# Patient Record
Sex: Female | Born: 1970
Health system: Southern US, Community
[De-identification: ages and names within clinical notes are randomized; demographics above are authoritative.]

## PROBLEM LIST (undated history)

## (undated) DIAGNOSIS — R519 Headache, unspecified: Secondary | ICD-10-CM

## (undated) DIAGNOSIS — I219 Acute myocardial infarction, unspecified: Secondary | ICD-10-CM

## (undated) DIAGNOSIS — K219 Gastro-esophageal reflux disease without esophagitis: Secondary | ICD-10-CM

## (undated) DIAGNOSIS — I639 Cerebral infarction, unspecified: Secondary | ICD-10-CM

## (undated) DIAGNOSIS — N3289 Other specified disorders of bladder: Secondary | ICD-10-CM

## (undated) DIAGNOSIS — J45909 Unspecified asthma, uncomplicated: Secondary | ICD-10-CM

## (undated) DIAGNOSIS — F329 Major depressive disorder, single episode, unspecified: Secondary | ICD-10-CM

## (undated) DIAGNOSIS — I10 Essential (primary) hypertension: Secondary | ICD-10-CM

## (undated) DIAGNOSIS — F419 Anxiety disorder, unspecified: Secondary | ICD-10-CM

## (undated) DIAGNOSIS — H269 Unspecified cataract: Secondary | ICD-10-CM

## (undated) DIAGNOSIS — G4730 Sleep apnea, unspecified: Secondary | ICD-10-CM

## (undated) DIAGNOSIS — R0902 Hypoxemia: Secondary | ICD-10-CM

## (undated) DIAGNOSIS — I739 Peripheral vascular disease, unspecified: Secondary | ICD-10-CM

## (undated) DIAGNOSIS — R51 Headache: Secondary | ICD-10-CM

## (undated) DIAGNOSIS — E079 Disorder of thyroid, unspecified: Secondary | ICD-10-CM

## (undated) DIAGNOSIS — G709 Myoneural disorder, unspecified: Secondary | ICD-10-CM

## (undated) DIAGNOSIS — M1990 Unspecified osteoarthritis, unspecified site: Secondary | ICD-10-CM

## (undated) DIAGNOSIS — E785 Hyperlipidemia, unspecified: Secondary | ICD-10-CM

## (undated) DIAGNOSIS — F32A Depression, unspecified: Secondary | ICD-10-CM

## (undated) DIAGNOSIS — E119 Type 2 diabetes mellitus without complications: Secondary | ICD-10-CM

## (undated) DIAGNOSIS — N200 Calculus of kidney: Secondary | ICD-10-CM

## (undated) DIAGNOSIS — R0602 Shortness of breath: Secondary | ICD-10-CM

## (undated) DIAGNOSIS — I209 Angina pectoris, unspecified: Secondary | ICD-10-CM

## (undated) DIAGNOSIS — T7840XA Allergy, unspecified, initial encounter: Secondary | ICD-10-CM

## (undated) DIAGNOSIS — F4310 Post-traumatic stress disorder, unspecified: Secondary | ICD-10-CM

## (undated) DIAGNOSIS — F603 Borderline personality disorder: Secondary | ICD-10-CM

## (undated) HISTORY — DX: Hypoxemia: R09.02

## (undated) HISTORY — DX: Hyperlipidemia, unspecified: E78.5

## (undated) HISTORY — DX: Unspecified cataract: H26.9

## (undated) HISTORY — PX: CHOLECYSTECTOMY: SHX55

## (undated) HISTORY — PX: SPINE SURGERY: SHX786

## (undated) HISTORY — DX: Allergy, unspecified, initial encounter: T78.40XA

## (undated) HISTORY — PX: WISDOM TOOTH EXTRACTION: SHX21

## (undated) HISTORY — PX: CARPAL TUNNEL RELEASE: SHX101

## (undated) HISTORY — DX: Disorder of thyroid, unspecified: E07.9

## (undated) HISTORY — DX: Cerebral infarction, unspecified: I63.9

## (undated) HISTORY — DX: Acute myocardial infarction, unspecified: I21.9

## (undated) HISTORY — DX: Peripheral vascular disease, unspecified: I73.9

---
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
## 2001-08-20 HISTORY — PX: CERVICAL FUSION: SHX112

## 2007-11-04 ENCOUNTER — Ambulatory Visit: Payer: Self-pay | Admitting: Cardiology

## 2007-11-14 ENCOUNTER — Ambulatory Visit: Payer: Self-pay | Admitting: Cardiology

## 2008-03-08 ENCOUNTER — Ambulatory Visit: Payer: Self-pay | Admitting: Cardiology

## 2010-12-28 ENCOUNTER — Other Ambulatory Visit: Payer: Self-pay | Admitting: Medical

## 2011-01-01 ENCOUNTER — Other Ambulatory Visit: Payer: Self-pay | Admitting: Medical

## 2011-01-02 ENCOUNTER — Other Ambulatory Visit: Payer: Self-pay | Admitting: Medical

## 2011-01-02 NOTE — Assessment & Plan Note (Signed)
[facility]                          [facility]   NAME:Kathryn Jimenez, Kathryn Jimenez                        MRN:          [id number]  DATE:03/08/2008                            DOB:          [birth year]    PRIMARY CARDIOLOGIST:  Learta Codding, MD, [facility]   REASON FOR VISIT:  Scheduled followup.   Please refer to Meredith Mody initial office consultation note of  November 04, 2007, for full details.   Kathryn Jimenez was referred for extensive noninvasive workup for evaluation  of chest pain.  She presented with cardiac risk factors notable for type  2 diabetes mellitus and obesity.  Studies consisted of a baseline 2-D  echo, dobutamine stress echo, fasting lipid profile, D-dimer level, and  overnight pulse oximetry for screening of sleep apnea.   All of the studies, saved for the sleep apnea screening, were normal.  Echocardiography revealed normal LVEF (55-60%) with no wall motion  abnormalities.  The dobutamine stress echo test yielded no evidence of  ischemia.   D-dimer level was entirely within normal limits.   Overnight pulse oximetry screening was suggestive of possible sleep  apnea.  She has since been formally diagnosed with this, and has been  placed on CPAP, by Dr. Ninetta Lights, for the past month or so.   Fasting lipid profile was also checked and the patient was subsequently  started on simvastatin 40 daily, for an LDL level of 151.  She reported  muscle weakness with this, however, and was started on Crestor 10 daily.  This resulted in a marked drop in her LDL from 151 to her current level  of 84.  Total cholesterol dropped from 222 to 146.   Clinically, Kathryn Jimenez states that she is still experiencing some  weakness with the statin, but she feels that she is able to tolerate  it at this current dose.   Since our last visit, Kathryn Jimenez has been placed on Lantus insulin, for  aggressive treatment of her uncontrolled diabetes  mellitus.  She is also  noting improvement in her overall stamina and energy level during the  day, since starting on CPAP approximately 1 month ago.   Of note, the patient was also referred for CardioNet monitoring to  exclude any dysrhythmia, given her complaints of palpitations.  This  demonstrated a brief episode of SVT, with no definite evidence of atrial  fibrillation and most suggestive of an atrial tachycardia.  This was of  very brief duration, lasting less than 20 beats.  No other dysrhythmia  noted.  These results were also reviewed with the patient.   CURRENT MEDICATIONS:  1. Crestor 10 nightly.  2. Lantus 20 units nightly.  3. Furosemide 40 daily.  4. Potassium 20 daily.  5. Lisinopril 20 daily.  6. Gabapentin 1200 t.i.d.  7. Actos 30 daily.  8. Glyburide/metformin 5/500 2 tablets b.i.d.  9. Topamax 100 nightly.  10.Lorazepam 1 mg q.i.d.  11.Effexor XR 150 b.i.d.  12.Depo-Provera every 3 months.   PHYSICAL EXAMINATION:  VITAL SIGNS:  Blood pressure 134/74, pulse 90,  regular and weight  342.  GENERAL:  A 40 year old female, morbidly obese, sitting upright, no  distress.  HEENT:  Normocephalic and atraumatic.  NECK:  Palpable carotid pulses without bruits; unable to assess JVD,  secondary to neck girth.  LUNGS:  Clear to auscultation in all fields.  HEART:  RRR (S1S2).  No significant murmurs.  ABDOMEN:  Protuberant, nontender.  EXTREMITIES:  2+ bilateral nonpitting edema.  NEURO:  No focal deficit.   IMPRESSION:  1. Noncardiac chest pain.      a.     Negative extensive noninvasive workup, negative for       ischemia.  2. Normal LVF.  3. Insulin-dependent diabetes mellitus.  4. Dyslipidemia.      a.     Significant improvement following addition of a statin.  5. Morbid obesity.  6. Chronic lower extremity edema.  7. Gastroesophageal reflux disease.      a.     History of esophageal dilatation.  8. Palpitations.      a.     No definite dysrhythmia by  recent CardioNet monitoring.   PLAN:  No further cardiac testing recommended at this point in time.  We  spent quite a bit of time discussing the importance of primary  prevention, with respect to her diabetes mellitus, dyslipidemia, as well  as morbid obesity.  I suggested that she try to tolerate the low dose of  Crestor if she could, given her very good response to a statin based on  most recent lipid profile.  Also suggested that we add fish oil to her  regimen, as well.  Future followup of her lipid profile will be deferred  to Dr. Ernestine Conrad, with whom the patient is to maintain regular  scheduled followup.   We will have the patient return to Dr. Lewayne Bunting on an as needed basis.      Rozell Searing, PA-C  Electronically Signed      Jonelle Sidle, MD  Electronically Signed   GS/MedQ  DD: 03/08/2008  DT: 03/09/2008  Job #: [id number]   cc:   Ernestine Conrad, MD

## 2011-01-02 NOTE — Assessment & Plan Note (Signed)
[facility]                          [facility] NOTE   NAME:Nobles, JANAZIA                        MRN:          [id number]  DATE:11/04/2007                            DOB:          1970/12/20    CARDIOLOGIST:  She will be new to Dr. Lewayne Bunting.   REFERRING PHYSICIAN:  Dr. Ernestine Conrad.   REASON FOR REFERRAL:  Chest pain.   HISTORY OF PRESENT ILLNESS:  Ms. Sonnenfeld is a 40 year old female with a  history of diabetes mellitus since 1999 who presents to the office today  for further evaluation of chest pain.  The patient has a significant  history of depression, post-traumatic stress disorder and panic disorder  as well as obsessive compulsive disorder.  She apparently has been under  quite a bit of stress recently.  She has had two to three episodes of  chest pain over the last several weeks.  Her worst episode was actually  at the end of December.  She presented to the emergency room on New  Years Eve with complaints of chest pain.  She was diagnosed with chest  wall pain by the ER physician.  She had one set of cardiac markers which  were negative.  She also had a chest x-ray which showed no active  disease.  Her EKG showed a rightward axis, normal sinus rhythm and no  acute changes.  She was tachycardic with a heart rate 112.   The patient notes that her chest pain has been brought on by stress in  the past. Her last episode was about a week ago when she was arguing  with her husband.  She describes it as a stabbing type pain on the left  side.  It radiates down through her breast under her breasts.  Sometimes  it last a few seconds.  It has lasted up to about 5 minutes in the past.  She does note some slight shortness of breath with this but no nausea.  She has been diaphoretic in the past.  Her worst episode made her  lightheaded but she denies any syncope or near-syncope.  She does note  her heart racing sometimes when she gets upset.   Otherwise denies any  palpitations. She sleeps on three pillows and has done so for many  years.  She also awakens short of breath from time to time.  Her  symptoms are not exactly consistent with PND however.  She has chronic  pedal edema.  She is on Lasix therapy.  It seems to have gotten worse  recently and it sounds as though Dr. Loney Hering may want to switch her Actos  to a different medication secondary to this.   PAST MEDICAL HISTORY:  1. Diabetes mellitus since 1999.  2. Questionable exercise-induced asthma.  3. Depression.  4. Obsessive-compulsive disorder.  5. Posttraumatic stress disorder.  6. Panic disorder.  7. Diabetic neuropathy.  8. Peripheral edema.  9. Irritable bowel syndrome.  10.History of ovarian cyst.  11.GERD.      a.     Status post esophageal dilatation in the past.  12.Allergic rhinitis.  13.History of phen-fen use in the 1990s .  14.History of the urethra stretching.  15.Laparoscopic cholecystectomy 2002.  16.History of C5-C6 fusion 2003.   MEDICATIONS:  1. Loratadine 10 mg daily.  2. Effexor XR 75 mg b.i.d.  3. Lorazepam 1 mg 4 times a day.  4. Topamax 100 mg q.h.s.  5. Glyburide/metformin 5/500 mg b.i.d.  6. Actos 30 mg daily.  7. Gabapentin 600 mg 3 times a day.  8. Lisinopril 20 mg daily.  9. Furosemide 40 mg daily.  10.Potassium 10 mEq daily.  11.Albuterol inhaler p.r.n.  12.Depo Provera q. 3 months.  13.Ibuprofen p.r.n.  14.Hydrocodone/APAP p.r.n.   ALLERGIES:  She is developed a rash to morphine in the past, GI upset to  Augmentin. She had some type of reaction when she received some type of  dye for a bone scan several years ago.  I cannot find records on this.  She did pass out and was apparently resuscitated with food.  There was  no evidence of anaphylaxis per her report.  She denies any history of  allergies to shellfish.   SOCIAL HISTORY:  She denies any history of tobacco or alcohol abuse.  She is married, has one stepson and  she is disabled secondary to her  psychiatric disorders.   FAMILY HISTORY:  Significant for COPD in her mother who died from  congestive heart failure at age 73.  Her father had lung cancer  secondary to Agent [place] and also had strokes in his 70s.  She has a  sister who had rheumatic heart disease.   REVIEW OF SYSTEMS:  Please see HPI.  She does note nasal congestion  recently as well as sore throat.  She also notes some dysphagia which  was mild.  This was nothing like what she had with her esophageal  dilatation in the past.  She denies melena or hematochezia. She denies  any dysuria or hematuria.  She denies any monocular blindness,  unilateral weakness, difficulty with speech or facial drooping.  Denies  any claudication symptoms.  The patient notes that her husband tells her  that she snores. She also notes significant daytime hypersomnolence. She  was worked up for sleep apnea many years ago. This was apparently  negative at that time. The rest of the review of systems are negative.   PHYSICAL EXAM:  She is a well-nourished, well-developed female in no  distress.  Blood pressure is 114/64, pulse 112, temperature 98.4, weight 336  pounds.  Oxygen saturation 99% on room air.  HEENT:  Normal.  NECK:  I cannot appreciate any JVD secondary to neck girth.  CARDIAC:  Normal S1, S2. Regular rate and rhythm.  I cannot appreciate  any murmurs.  LUNGS:  Clear to auscultation bilaterally.  ABDOMEN:  Soft, nontender.  No organomegaly.  Normoactive bowel sounds.  No bruits.  EXTREMITIES:  With trace edema bilaterally.  Calves are soft, nontender.  Skin is warm and dry.  NEUROLOGIC:  She is alert and oriented x3.  Cranial nerves II-XII  grossly intact.  VASCULAR:  No carotid artery bruits noted bilaterally.   Electrocardiogram revealed sinus tachycardia with a heart rate of 113,  rightward axis, nonspecific ST-T wave changes.   IMPRESSION:  1. Chest pain.  2. Diabetes mellitus type  2.  3. Fatigue.      a.     Rule out sleep apnea.  4. Morbid obesity.      a.     History  of phen-fen use.  5. Questionable history of asthma.  6. Depression/post-traumatic stress disorder.  7. Peripheral neuropathy.  8. Peripheral edema.  9. History of gastroesophageal reflux disease with esophageal      dilatation in the past.      a.     Recent dysphagia - unlike previous dysphagia requiring       dilatation.  10.Sinus tachycardia.   DISCUSSION:  Ms. Whitehorn presents to the office today for further  evaluation of chest pain.  She also used phen-fen many years ago.  She  has had no follow-up testing since that time.  Her chest pain is fairly  atypical.  She does have risk factors to include diabetes mellitus.  She  is somewhat tachycardiac on exam.  I suspect that this is likely  physiologic given her morbid obesity.   PLAN:  1. Check a 2-D echocardiogram to assess cardiac structure and function      and valvular status especially given her history of phen-fen use.  2. Will proceed with a dobutamine stress echocardiogram to rule out      the possibility of ischemia as a cause for her chest pain.  3. We will also arrange apnea link to rule out sleep apnea given her      history of fatigue.  She certainly has a body habitus for      obstructive sleep apnea.  4. We will set her up for a fasting lipid profile for risk      stratification. Given her history of diabetes mellitus, if her LDL      is greater than 70, she would likely benefit from statin therapy.  5. We will have her return for follow-up in the next 4-6 weeks to go      over the above testing.  She does want a prescription for exercise.      We will review that with her when she returns to go over the      results of for testing.  6. Of note, she does have sinus tachycardia and a rightward axis. We      will go ahead and check a D-dimer to rule out the possibility of      pulmonary embolus.  7. Upon further review  with Dr. Andee Lineman who also saw patient, she does      note significant palpitations to him.  Therefore we will set her up      for a CardioNet event monitor to further assess these.  We will see      her back after all the above testing is completed.      Tereso Newcomer, PA-C  Electronically Signed      Learta Codding, MD,FACC  Electronically Signed   SW/MedQ  DD: 11/04/2007  DT: 11/04/2007  Job #: [id number]   cc:   Leta Jungling, MD

## 2014-02-26 ENCOUNTER — Encounter (INDEPENDENT_AMBULATORY_CARE_PROVIDER_SITE_OTHER): Payer: Self-pay | Admitting: *Deleted

## 2014-03-17 ENCOUNTER — Encounter (INDEPENDENT_AMBULATORY_CARE_PROVIDER_SITE_OTHER): Payer: Self-pay

## 2014-03-18 ENCOUNTER — Other Ambulatory Visit (INDEPENDENT_AMBULATORY_CARE_PROVIDER_SITE_OTHER): Payer: Self-pay | Admitting: *Deleted

## 2014-03-18 ENCOUNTER — Encounter (INDEPENDENT_AMBULATORY_CARE_PROVIDER_SITE_OTHER): Payer: Self-pay | Admitting: *Deleted

## 2014-03-18 DIAGNOSIS — Z800 Family history of malignant neoplasm of digestive organs: Secondary | ICD-10-CM

## 2014-03-18 DIAGNOSIS — Z86010 Personal history of colonic polyps: Secondary | ICD-10-CM

## 2014-05-18 ENCOUNTER — Encounter (HOSPITAL_COMMUNITY): Payer: Self-pay | Admitting: Pharmacy Technician

## 2014-05-21 ENCOUNTER — Telehealth (INDEPENDENT_AMBULATORY_CARE_PROVIDER_SITE_OTHER): Payer: Self-pay | Admitting: *Deleted

## 2014-05-21 DIAGNOSIS — Z1211 Encounter for screening for malignant neoplasm of colon: Secondary | ICD-10-CM

## 2014-05-21 NOTE — Telephone Encounter (Signed)
Patient needs trilyte 

## 2014-05-24 MED ORDER — PEG 3350-KCL-NA BICARB-NACL 420 G PO SOLR
4000.0000 mL | Freq: Once | ORAL | Status: DC
Start: 2014-05-24 — End: 2014-06-24

## 2014-05-27 ENCOUNTER — Telehealth (INDEPENDENT_AMBULATORY_CARE_PROVIDER_SITE_OTHER): Payer: Self-pay | Admitting: *Deleted

## 2014-05-27 NOTE — Telephone Encounter (Signed)
agree

## 2014-05-27 NOTE — Telephone Encounter (Signed)
PCP: [facility]   Procedure: tcs w/ propofol  Reason/Indication:  fam hx colon ca, hx polyps  Has patient had this procedure before?  Yes, 2010 --s canned  If so, when, by whom and where?    Is there a family history of colon cancer?  Yes, half sister  Who?  What age when diagnosed?    Is patient diabetic?   yes      Does patient have prosthetic heart valve?  no  Do you have a pacemaker?  no  Has patient ever had endocarditis? no  Has patient had joint replacement within last 12 months?  no  Does patient tend to be constipated or take laxatives? occassionally  Is patient on Coumadin, Plavix and/or Aspirin? no  Medications: see epic  Allergies: see epic  Medication Adjustment: hold metformin day before, decrease Lantus to 15 units tid day before & novolog to 30 units bid day before  Procedure date & time: 06/24/14 at 730 -- pre-op 06/17/14 at 11:00

## 2014-06-16 NOTE — Patient Instructions (Signed)
Kathryn Jimenez  06/16/2014   Your procedure is scheduled on:  06/24/14  Report to [facility] at 6:15 AM.  Call this number if you have problems the morning of surgery: [phone]   Remember:   Do not eat food or drink liquids after midnight.   Take these medicines the morning of surgery with A SIP OF WATER:  ALBUTEROL (ALSO BRING WITH YOU),AMLODIPINE,WELBUTRIN,  LISINOPRIL,ATIVAN,PROVERA,LYRICA,EFEXOR,ULTRAM   NO AM INSULIN/ ONLY 1/2 OF PM DOSE 11/4   Do not wear jewelry, make-up or nail polish.  Do not wear lotions, powders, or perfumes. You may wear deodorant.  Do not shave 48 hours prior to surgery. Men may shave face and neck.  Do not bring valuables to the hospital.  [facility] is not responsible                  for  any belongings or valuables.               Contacts, dentures or bridgework may not be worn into surgery.  Leave suitcase in the car. After surgery it may be brought to your room.  For patients admitted to the hospital, discharge time is determined by your                 treatment team.               Patients discharged the day of surgery will not be allowed to drive  home.  Name and phone number of your driver:   Special Instructions:    Please read over the following fact sheets that you were given: Anesthesia Post-op Instructions   PATIENT INSTRUCTIONS POST-ANESTHESIA  IMMEDIATELY FOLLOWING SURGERY:  Do not drive or operate machinery for the first twenty four hours after surgery.  Do not make any important decisions for twenty four hours after surgery or while taking narcotic pain medications or sedatives.  If you develop intractable nausea and vomiting or a severe headache please notify your doctor immediately.  FOLLOW-UP:  Please make an appointment with your surgeon as instructed. You do not need to follow up with anesthesia unless specifically instructed to do so.  WOUND CARE INSTRUCTIONS (if applicable):  Keep a dry clean dressing on the  anesthesia/puncture wound site if there is drainage.  Once the wound has quit draining you may leave it open to air.  Generally you should leave the bandage intact for twenty four hours unless there is drainage.  If the epidural site drains for more than 36-48 hours please call the anesthesia department.  QUESTIONS?:  Please feel free to call your physician or the hospital operator if you have any questions, and they will be happy to assist you.      Colonoscopy A colonoscopy is an exam to look at the entire large intestine (colon). This exam can help find problems such as tumors, polyps, inflammation, and areas of bleeding. The exam takes about 1 hour.  LET [facility] CARE PROVIDER KNOW ABOUT:   Any allergies you have.  All medicines you are taking, including vitamins, herbs, eye drops, creams, and over-the-counter medicines.  Previous problems you or members of your family have had with the use of anesthetics.  Any blood disorders you have.  Previous surgeries you have had.  Medical conditions you have. RISKS AND COMPLICATIONS  Generally, this is a safe procedure. However, as with any procedure, complications can occur. Possible complications include:  Bleeding.  Tearing or rupture of the colon wall.  Reaction to medicines given during the exam.  Infection (rare). BEFORE THE PROCEDURE   Ask your health care provider about changing or stopping your regular medicines.  You may be prescribed an oral bowel prep. This involves drinking a large amount of medicated liquid, starting the day before your procedure. The liquid will cause you to have multiple loose stools until your stool is almost clear or light green. This cleans out your colon in preparation for the procedure.  Do not eat or drink anything else once you have started the bowel prep, unless your health care provider tells you it is safe to do so.  Arrange for someone to drive you home after the procedure. PROCEDURE    You will be given medicine to help you relax (sedative).  You will lie on your side with your knees bent.  A long, flexible tube with a light and camera on the end (colonoscope) will be inserted through the rectum and into the colon. The camera sends video back to a computer screen as it moves through the colon. The colonoscope also releases carbon dioxide gas to inflate the colon. This helps your health care provider see the area better.  During the exam, your health care provider may take a small tissue sample (biopsy) to be examined under a microscope if any abnormalities are found.  The exam is finished when the entire colon has been viewed. AFTER THE PROCEDURE   Do not drive for 24 hours after the exam.  You may have a small amount of blood in your stool.  You may pass moderate amounts of gas and have mild abdominal cramping or bloating. This is caused by the gas used to inflate your colon during the exam.  Ask when your test results will be ready and how you will get your results. Make sure you get your test results. Document Released: 08/03/2000 Document Revised: 05/27/2013 Document Reviewed: 04/13/2013 [facility] Patient Information 2015 [place], [place]. This information is not intended to replace advice given to you by your health care provider. Make sure you discuss any questions you have with your health care provider.

## 2014-06-17 ENCOUNTER — Encounter (HOSPITAL_COMMUNITY): Payer: Self-pay

## 2014-06-17 ENCOUNTER — Encounter (HOSPITAL_COMMUNITY)
Admission: RE | Admit: 2014-06-17 | Discharge: 2014-06-17 | Disposition: A | Discharge status: Home / Self Care | Payer: BC Managed Care – PPO | Source: Ambulatory Visit | Attending: Internal Medicine | Admitting: Internal Medicine

## 2014-06-17 DIAGNOSIS — Z01812 Encounter for preprocedural laboratory examination: Secondary | ICD-10-CM | POA: Insufficient documentation

## 2014-06-17 HISTORY — DX: Headache, unspecified: R51.9

## 2014-06-17 HISTORY — DX: Myoneural disorder, unspecified: G70.9

## 2014-06-17 HISTORY — DX: Type 2 diabetes mellitus without complications: E11.9

## 2014-06-17 HISTORY — DX: Angina pectoris, unspecified: I20.9

## 2014-06-17 HISTORY — DX: Headache: R51

## 2014-06-17 HISTORY — DX: Depression, unspecified: F32.A

## 2014-06-17 HISTORY — DX: Major depressive disorder, single episode, unspecified: F32.9

## 2014-06-17 HISTORY — DX: Post-traumatic stress disorder, unspecified: F43.10

## 2014-06-17 HISTORY — DX: Calculus of kidney: N20.0

## 2014-06-17 HISTORY — DX: Shortness of breath: R06.02

## 2014-06-17 HISTORY — DX: Unspecified osteoarthritis, unspecified site: M19.90

## 2014-06-17 HISTORY — DX: Sleep apnea, unspecified: G47.30

## 2014-06-17 HISTORY — DX: Essential (primary) hypertension: I10

## 2014-06-17 HISTORY — DX: Gastro-esophageal reflux disease without esophagitis: K21.9

## 2014-06-17 HISTORY — DX: Other specified disorders of bladder: N32.89

## 2014-06-17 HISTORY — DX: Borderline personality disorder: F60.3

## 2014-06-17 HISTORY — DX: Unspecified asthma, uncomplicated: J45.909

## 2014-06-17 HISTORY — DX: Anxiety disorder, unspecified: F41.9

## 2014-06-17 LAB — BASIC METABOLIC PANEL
Anion gap: 12 (ref 5–15)
BUN: 6 mg/dL (ref 6–23)
CO2: 26 mEq/L (ref 19–32)
Calcium: 8.9 mg/dL (ref 8.4–10.5)
Chloride: 101 mEq/L (ref 96–112)
Creatinine, Ser: 0.67 mg/dL (ref 0.50–1.10)
GFR calc Af Amer: >90 mL/min (ref >90)
GFR calc non Af Amer: >90 mL/min (ref >90)
Glucose, Bld: 214 mg/dL — ABNORMAL HIGH (ref 70–99)
Potassium: 4.2 mEq/L (ref 3.7–5.3)
Sodium: 139 mEq/L (ref 137–147)

## 2014-06-17 LAB — HCG, SERUM, QUALITATIVE: Preg, Serum: NEGATIVE

## 2014-06-17 LAB — HEMOGLOBIN AND HEMATOCRIT, BLOOD
HCT: 36.7 % (ref 36.0–46.0)
Hemoglobin: 12 g/dL (ref 12.0–15.0)

## 2014-06-24 ENCOUNTER — Encounter (HOSPITAL_COMMUNITY)
Admission: RE | Disposition: A | Discharge status: Home / Self Care | Payer: Self-pay | Source: Ambulatory Visit | Attending: Internal Medicine

## 2014-06-24 ENCOUNTER — Encounter (HOSPITAL_COMMUNITY): Payer: Self-pay | Admitting: *Deleted

## 2014-06-24 ENCOUNTER — Ambulatory Visit (HOSPITAL_COMMUNITY): Payer: BC Managed Care – PPO | Admitting: Anesthesiology

## 2014-06-24 ENCOUNTER — Ambulatory Visit (HOSPITAL_COMMUNITY)
Admission: RE | Admit: 2014-06-24 | Discharge: 2014-06-24 | Disposition: A | Discharge status: Home / Self Care | Payer: BC Managed Care – PPO | Source: Ambulatory Visit | Attending: Internal Medicine | Admitting: Internal Medicine

## 2014-06-24 DIAGNOSIS — F418 Other specified anxiety disorders: Secondary | ICD-10-CM | POA: Diagnosis not present

## 2014-06-24 DIAGNOSIS — Z79899 Other long term (current) drug therapy: Secondary | ICD-10-CM | POA: Diagnosis not present

## 2014-06-24 DIAGNOSIS — Z1211 Encounter for screening for malignant neoplasm of colon: Secondary | ICD-10-CM | POA: Insufficient documentation

## 2014-06-24 DIAGNOSIS — G4730 Sleep apnea, unspecified: Secondary | ICD-10-CM | POA: Insufficient documentation

## 2014-06-24 DIAGNOSIS — F4310 Post-traumatic stress disorder, unspecified: Secondary | ICD-10-CM | POA: Diagnosis not present

## 2014-06-24 DIAGNOSIS — E119 Type 2 diabetes mellitus without complications: Secondary | ICD-10-CM | POA: Diagnosis not present

## 2014-06-24 DIAGNOSIS — Z86010 Personal history of colonic polyps: Secondary | ICD-10-CM

## 2014-06-24 DIAGNOSIS — D128 Benign neoplasm of rectum: Secondary | ICD-10-CM

## 2014-06-24 DIAGNOSIS — K644 Residual hemorrhoidal skin tags: Secondary | ICD-10-CM | POA: Diagnosis not present

## 2014-06-24 DIAGNOSIS — I10 Essential (primary) hypertension: Secondary | ICD-10-CM | POA: Insufficient documentation

## 2014-06-24 DIAGNOSIS — Z800 Family history of malignant neoplasm of digestive organs: Secondary | ICD-10-CM

## 2014-06-24 DIAGNOSIS — J45909 Unspecified asthma, uncomplicated: Secondary | ICD-10-CM | POA: Insufficient documentation

## 2014-06-24 DIAGNOSIS — Z794 Long term (current) use of insulin: Secondary | ICD-10-CM | POA: Insufficient documentation

## 2014-06-24 HISTORY — PX: COLONOSCOPY WITH PROPOFOL: SHX5780

## 2014-06-24 HISTORY — PX: POLYPECTOMY: SHX5525

## 2014-06-24 LAB — GLUCOSE, CAPILLARY: Glucose-Capillary: 132 mg/dL — ABNORMAL HIGH (ref 70–99)

## 2014-06-24 SURGERY — COLONOSCOPY WITH PROPOFOL
Anesthesia: Monitor Anesthesia Care

## 2014-06-24 MED ORDER — BENEFIBER DRINK MIX PO PACK: 4.0000 g | PACK | Freq: Every day | ORAL | Status: DC

## 2014-06-24 MED ORDER — FENTANYL CITRATE 0.05 MG/ML IJ SOLN
INTRAMUSCULAR | Status: AC
  Filled 2014-06-24: qty 2

## 2014-06-24 MED ORDER — LACTATED RINGERS IV SOLN
INTRAVENOUS | Status: DC
  Administered 2014-06-24: 07:00:00 via INTRAVENOUS

## 2014-06-24 MED ORDER — ONDANSETRON HCL 4 MG/2ML IJ SOLN: 4.0000 mg | Freq: Once | INTRAMUSCULAR | Status: DC | PRN

## 2014-06-24 MED ORDER — ONDANSETRON HCL 4 MG/2ML IJ SOLN
INTRAMUSCULAR | Status: AC
  Filled 2014-06-24: qty 2

## 2014-06-24 MED ORDER — PROPOFOL 10 MG/ML IV BOLUS
INTRAVENOUS | Status: AC
  Filled 2014-06-24: qty 20

## 2014-06-24 MED ORDER — MIDAZOLAM HCL 2 MG/2ML IJ SOLN
INTRAMUSCULAR | Status: AC
Start: 2014-06-24 — End: 2014-06-24
  Filled 2014-06-24: qty 2

## 2014-06-24 MED ORDER — GLYCOPYRROLATE 0.2 MG/ML IJ SOLN
INTRAMUSCULAR | Status: AC
  Filled 2014-06-24: qty 1

## 2014-06-24 MED ORDER — PROPOFOL INFUSION 10 MG/ML OPTIME
INTRAVENOUS | Status: DC | PRN
  Administered 2014-06-24: 50 ug/kg/min via INTRAVENOUS
  Administered 2014-06-24: 08:00:00 via INTRAVENOUS

## 2014-06-24 MED ORDER — MIDAZOLAM HCL 5 MG/5ML IJ SOLN
INTRAMUSCULAR | Status: DC | PRN
  Administered 2014-06-24: 0.5 mg via INTRAVENOUS

## 2014-06-24 MED ORDER — LACTATED RINGERS IV SOLN
INTRAVENOUS | Status: DC | PRN
  Administered 2014-06-24: 07:00:00 via INTRAVENOUS

## 2014-06-24 MED ORDER — ONDANSETRON HCL 4 MG/2ML IJ SOLN
4.0000 mg | Freq: Once | INTRAMUSCULAR | Status: AC
  Administered 2014-06-24: 4 mg via INTRAVENOUS

## 2014-06-24 MED ORDER — GLYCOPYRROLATE 0.2 MG/ML IJ SOLN
0.2000 mg | Freq: Once | INTRAMUSCULAR | Status: AC
  Administered 2014-06-24: 0.2 mg via INTRAVENOUS

## 2014-06-24 MED ORDER — FENTANYL CITRATE 0.05 MG/ML IJ SOLN
25.0000 ug | INTRAMUSCULAR | Status: AC
Start: 2014-06-24 — End: 2014-06-24
  Administered 2014-06-24 (×2): 25 ug via INTRAVENOUS

## 2014-06-24 MED ORDER — FENTANYL CITRATE 0.05 MG/ML IJ SOLN
25.0000 ug | INTRAMUSCULAR | Status: DC | PRN
  Administered 2014-06-24 (×3): 50 ug via INTRAVENOUS
  Filled 2014-06-24 (×2): qty 2

## 2014-06-24 MED ORDER — FENTANYL CITRATE 0.05 MG/ML IJ SOLN
INTRAMUSCULAR | Status: DC | PRN
  Administered 2014-06-24 (×2): 25 ug via INTRAVENOUS

## 2014-06-24 MED ORDER — MIDAZOLAM HCL 2 MG/2ML IJ SOLN
1.0000 mg | INTRAMUSCULAR | Status: DC | PRN
  Administered 2014-06-24: 2 mg via INTRAVENOUS

## 2014-06-24 MED ORDER — STERILE WATER FOR IRRIGATION IR SOLN
Status: DC | PRN
  Administered 2014-06-24: 1005 mL

## 2014-06-24 MED ORDER — LIDOCAINE HCL (CARDIAC) 10 MG/ML IV SOLN
INTRAVENOUS | Status: DC | PRN
  Administered 2014-06-24: 50 mg via INTRAVENOUS

## 2014-06-24 MED ORDER — MIDAZOLAM HCL 2 MG/2ML IJ SOLN
INTRAMUSCULAR | Status: AC
  Filled 2014-06-24: qty 2

## 2014-06-24 MED ORDER — LIDOCAINE HCL (PF) 1 % IJ SOLN
INTRAMUSCULAR | Status: AC
  Filled 2014-06-24: qty 5

## 2014-06-24 MED ORDER — SUCCINYLCHOLINE CHLORIDE 20 MG/ML IJ SOLN
INTRAMUSCULAR | Status: AC
  Filled 2014-06-24: qty 1

## 2014-06-24 MED ORDER — DOCUSATE SODIUM 100 MG PO CAPS: 200.0000 mg | ORAL_CAPSULE | Freq: Every day | ORAL | Status: DC

## 2014-06-24 SURGICAL SUPPLY — 24 items
ELECT REM PT RETURN 9FT ADLT (ELECTROSURGICAL) ×2
ELECTRODE REM PT RTRN 9FT ADLT (ELECTROSURGICAL) IMPLANT
FCP BXJMBJMB 240X2.8X (CUTTING FORCEPS)
FLOOR PAD 36X40 (MISCELLANEOUS) ×2
FORCEPS BIOP RAD 4 LRG CAP 4 (CUTTING FORCEPS) IMPLANT
FORCEPS BIOP RJ4 240 W/NDL (CUTTING FORCEPS)
FORCEPS BXJMBJMB 240X2.8X (CUTTING FORCEPS) IMPLANT
FORMALIN 10 PREFIL 20ML (MISCELLANEOUS) ×1 IMPLANT
INJECTOR/SNARE I SNARE (MISCELLANEOUS) IMPLANT
KIT CLEAN ENDO COMPLIANCE (KITS) ×2 IMPLANT
LUBRICANT JELLY 4.5OZ STERILE (MISCELLANEOUS) ×1 IMPLANT
MANIFOLD NEPTUNE II (INSTRUMENTS) ×1 IMPLANT
NDL SCLEROTHERAPY 25GX240 (NEEDLE) IMPLANT
NEEDLE SCLEROTHERAPY 25GX240 (NEEDLE) IMPLANT
PAD FLOOR 36X40 (MISCELLANEOUS) IMPLANT
PROBE APC STR FIRE (PROBE) IMPLANT
PROBE INJECTION GOLD (MISCELLANEOUS)
PROBE INJECTION GOLD 7FR (MISCELLANEOUS) IMPLANT
SNARE ROTATE MED OVAL 20MM (MISCELLANEOUS) ×1 IMPLANT
SNARE SHORT THROW 13M SML OVAL (MISCELLANEOUS) ×1 IMPLANT
SYR 50ML LL SCALE MARK (SYRINGE) ×1 IMPLANT
TRAP SPECIMEN MUCOUS 40CC (MISCELLANEOUS) ×1 IMPLANT
TUBING IRRIGATION ENDOGATOR (MISCELLANEOUS) ×1 IMPLANT
WATER STERILE IRR 1000ML POUR (IV SOLUTION) ×2 IMPLANT

## 2014-06-24 NOTE — Transfer of Care (Signed)
Immediate Anesthesia Transfer of Care Note  Patient: Kathryn Jimenez  Procedure(s) Performed: Procedure(s) with comments: COLONOSCOPY WITH PROPOFOL (N/A) - cecum time in 0810    time out   0821    total time 11 minutes RECTAL POLYPECTOMY (N/A)  Patient Location: PACU  Anesthesia Type:MAC  Level of Consciousness: awake, alert , oriented and patient cooperative  Airway & Oxygen Therapy: Patient Spontanous Breathing and Patient connected to face mask oxygen  Post-op Assessment: Report given to PACU RN and Post -op Vital signs reviewed and stable  Post vital signs: Reviewed and stable  Complications: No apparent anesthesia complications

## 2014-06-24 NOTE — Anesthesia Preprocedure Evaluation (Signed)
Anesthesia Evaluation  Patient identified by MRN, date of birth, ID band Patient awake    Reviewed: Allergy & Precautions, H&P , NPO status , Patient's Chart, lab work & pertinent test results  Airway Mallampati: III  TM Distance: >3 FB Neck ROM: Full   Comment: Large neck Dental  (+) Teeth Intact, Caps   Pulmonary shortness of breath, asthma , sleep apnea and Continuous Positive Airway Pressure Ventilation ,  breath sounds clear to auscultation        Cardiovascular hypertension, Pt. on medications Rhythm:Regular Rate:Normal     Neuro/Psych  Headaches, PSYCHIATRIC DISORDERS Anxiety Depression  Neuromuscular disease    GI/Hepatic GERD-  Medicated,  Endo/Other  diabetes, Type 2, Insulin DependentMorbid obesity  Renal/GU      Musculoskeletal  (+) Arthritis -,   Abdominal   Peds  Hematology   Anesthesia Other Findings   Reproductive/Obstetrics                             Anesthesia Physical Anesthesia Plan  ASA: III  Anesthesia Plan: MAC   Post-op Pain Management:    Induction: Intravenous  Airway Management Planned: Simple Face Mask  Additional Equipment:   Intra-op Plan:   Post-operative Plan:   Informed Consent: I have reviewed the patients History and Physical, chart, labs and discussed the procedure including the risks, benefits and alternatives for the proposed anesthesia with the patient or authorized representative who has indicated his/her understanding and acceptance.     Plan Discussed with:   Anesthesia Plan Comments:         Anesthesia Quick Evaluation

## 2014-06-24 NOTE — H&P (Signed)
Kathryn Jimenez is an 43 y.o. female.   Chief Complaint: Patient is here for colonoscopy. HPI: Patient is 43 year old Caucasian female with multiple medical problems including morbid obesity who is here for high-risk ringing colonoscopy. Family history significant for colon carcinoma and a half sister at 51. Last colonoscopy was in 2010 with removal of one polyp but it was hyperplastic. She also complains of constipation and change in caliber stools but caliber changes not fix her all the time. She also complains of mass in her upper mid abdomen. She denies melena or rectal bleeding.  Past Medical History  Diagnosis Date  . PTSD (post-traumatic stress disorder)   . Anxiety   . Depression   . Borderline personality disorder   . Anginal pain   . Hypertension   . Neuromuscular disorder     Neuropathy  . Sleep apnea   . Shortness of breath   . Asthma   . Diabetes mellitus without complication   . Bladder disorder, other   . Kidney stones   . GERD (gastroesophageal reflux disease)   . Headache   . Arthritis     Past Surgical History  Procedure Laterality Date  . Cholecystectomy    . Cervical fusion Bilateral 2003    C 5-6 with titanium screws  . Wisdom tooth extraction Bilateral 2001 and 1990s    top and bottom   . Urethral dilation  1981    Family History  Problem Relation Age of Onset  . COPD Mother   . Lung cancer Father   . Colon cancer Sister   . Breast cancer Sister    Social History:  reports that she has never smoked. She has never used smokeless tobacco. She reports that she does not drink alcohol or use illicit drugs.  Allergies:  Allergies  Allergen Reactions  . Ivp Dye [Iodinated Diagnostic Agents] Other (See Comments)    Passed out  . Augmentin [Amoxicillin-Pot Clavulanate] Nausea Only    Causes severe diarrhea  . Morphine And Related Itching and Rash    Medications Prior to Admission  Medication Sig Dispense Refill  . albuterol (PROVENTIL HFA;VENTOLIN  HFA) 108 (90 BASE) MCG/ACT inhaler Inhale 2 puffs into the lungs 4 (four) times daily as needed for wheezing or shortness of breath.    Marland Kitchen amLODipine (NORVASC) 5 MG tablet Take 5 mg by mouth daily.    Marland Kitchen atorvastatin (LIPITOR) 40 MG tablet Take 40 mg by mouth daily.    Marland Kitchen buPROPion (WELLBUTRIN SR) 150 MG 12 hr tablet Take 150 mg by mouth 2 (two) times daily.    . furosemide (LASIX) 40 MG tablet Take 40 mg by mouth daily.    . insulin aspart (NOVOLOG) 100 UNIT/ML injection Inject 50 Units into the skin 3 (three) times daily before meals.    . insulin NPH Human (HUMULIN N,NOVOLIN N) 100 UNIT/ML injection Inject 65-75 Units into the skin 3 (three) times daily. 75 units in the morning, 75 units at lunch, 65 units at bedtime.    Marland Kitchen lisinopril (PRINIVIL,ZESTRIL) 20 MG tablet Take 20 mg by mouth daily.    Marland Kitchen LORazepam (ATIVAN) 0.5 MG tablet Take 0.5 mg by mouth 3 (three) times daily.    . metFORMIN (GLUCOPHAGE-XR) 500 MG 24 hr tablet Take 500 mg by mouth 2 (two) times daily.    . polyethylene glycol-electrolytes (NULYTELY/GOLYTELY) 420 G solution Take 4,000 mLs by mouth once. 4000 mL 0  . potassium chloride (K-DUR) 10 MEQ tablet Take 20 mEq by mouth daily.    Marland Kitchen  pregabalin (LYRICA) 225 MG capsule Take 225 mg by mouth 3 (three) times daily.    . traMADol (ULTRAM) 50 MG tablet Take 50 mg by mouth every 4 (four) hours as needed for severe pain.    Marland Kitchen venlafaxine XR (EFFEXOR-XR) 150 MG 24 hr capsule Take 150 mg by mouth 2 (two) times daily.    . Carboxymethylcellul-Glycerin (REFRESH OPTIVE OP) Apply 1 drop to eye daily as needed (dry eyes).    . medroxyPROGESTERone (PROVERA) 10 MG tablet Take 10 mg by mouth See admin instructions. Once daily for the first 10 days of the month, every other month    . nystatin (MYCOSTATIN/NYSTOP) 100000 UNIT/GM POWD Apply 60 g topically 3 (three) times daily.    . prednisoLONE acetate (PRED FORTE) 1 % ophthalmic suspension Place 1 drop into both eyes 4 (four) times daily.       Results for orders placed or performed during the hospital encounter of 06/24/14 (from the past 48 hour(s))  Glucose, capillary     Status: Abnormal   Collection Time: 06/24/14  6:26 AM  Result Value Ref Range   Glucose-Capillary 132 (H) 70 - 99 mg/dL   Comment 1 Documented in Chart    No results found.  ROS  Temperature 98.2 F (36.8 C), last menstrual period 06/15/2014. Physical Exam  Constitutional:  Well-developed morbidly obese Caucasian female in NAD  HENT:  Mouth/Throat: Oropharynx is clear and moist.  Eyes: Conjunctivae are normal. No scleral icterus.  Neck: No thyromegaly present.  Cardiovascular: Normal rate, regular rhythm and normal heart sounds.   Respiratory: Effort normal and breath sounds normal.  GI: Soft. She exhibits no distension and no mass. There is no tenderness.  Fullness in upper mid abdomen but no definite mass palpated.  Musculoskeletal: Edema: nonpitting pretibial edema.  Lymphadenopathy:    She has no cervical adenopathy.  Neurological: She is alert.  Skin: Skin is warm and dry.     Assessment/Plan Family history of colon carcinoma. Chronic constipation. High risk screening colonoscopy.  REHMAN,NAJEEB U 06/24/2014, 7:24 AM

## 2014-06-24 NOTE — Discharge Instructions (Signed)
No aspirin or NSAIDs for 1 week. Resume usual medications and high fiber diet. Colace 200 mg by mouth daily at bedtime. Benefiber 4 g by mouth daily at bedtime. Physician will call with biopsy results. Abdominal pelvic CT to be scheduled. Office will call.  Colonoscopy, Care After  Refer to this sheet in the next few weeks. These instructions provide you with information on caring for yourself after your procedure. Your health care provider may also give you more specific instructions. Your treatment has been planned according to current medical practices, but problems sometimes occur. Call your health care provider if you have any problems or questions after your procedure. WHAT TO EXPECT AFTER THE PROCEDURE  After your procedure, it is typical to have the following:  A small amount of blood in your stool.  Moderate amounts of gas and mild abdominal cramping or bloating. HOME CARE INSTRUCTIONS  Do not drive, operate machinery, or sign important documents for 24 hours.  You may shower and resume your regular physical activities, but move at a slower pace for the first 24 hours.  Take frequent rest periods for the first 24 hours.  Walk around or put a warm pack on your abdomen to help reduce abdominal cramping and bloating.  Drink enough fluids to keep your urine clear or pale yellow.  You may resume your normal diet as instructed by your health care provider. Avoid heavy or fried foods that are hard to digest.  Avoid drinking alcohol for 24 hours or as instructed by your health care provider.  Only take over-the-counter or prescription medicines as directed by your health care provider.  If a tissue sample (biopsy) was taken during your procedure:  Do not take aspirin or blood thinners for 7 days, or as instructed by your health care provider.  Do not drink alcohol for 7 days, or as instructed by your health care provider.  Eat soft foods for the first 24 hours. SEEK MEDICAL  CARE IF: You have persistent spotting of blood in your stool 2-3 days after the procedure. SEEK IMMEDIATE MEDICAL CARE IF:  You have more than a small spotting of blood in your stool.  You pass large blood clots in your stool.  Your abdomen is swollen (distended).  You have nausea or vomiting.  You have a fever.  You have increasing abdominal pain that is not relieved with medicine.

## 2014-06-24 NOTE — Anesthesia Postprocedure Evaluation (Signed)
  Anesthesia Post-op Note  Patient: Kathryn Jimenez  Procedure(s) Performed: Procedure(s) with comments: COLONOSCOPY WITH PROPOFOL (N/A) - cecum time in 0810    time out   0821    total time 11 minutes RECTAL POLYPECTOMY (N/A)  Patient Location: PACU  Anesthesia Type:MAC  Level of Consciousness: awake, alert , oriented and patient cooperative  Airway and Oxygen Therapy: Patient Spontanous Breathing  Post-op Pain: mild  Post-op Assessment: Post-op Vital signs reviewed, Patient's Cardiovascular Status Stable, Respiratory Function Stable, Patent Airway, No signs of Nausea or vomiting and Pain level controlled  Post-op Vital Signs: Reviewed and stable  Last Vitals:  Filed Vitals:   06/24/14 0918  BP: 149/88  Pulse: 110  Temp: 36.8 C  Resp:     Complications: No apparent anesthesia complications

## 2014-06-24 NOTE — Op Note (Signed)
COLONOSCOPY PROCEDURE REPORT  PATIENT:  Kathryn Jimenez  MR#:  [id number] Birthdate:  05-08-1971, 43 y.o., female Endoscopist:  Dr. Rogene Houston, MD Referred By:  Dr. Celedonio Savage, MD Procedure Date: 06/24/2014  Procedure:   Colonoscopy  Indications:  Patient is 43 year old Caucasian female with multiple medical problems including morbid obesity who is undergoing high risk screening colonoscopy. Patient's half-sister was diagnosed with colon cancer at age 51. Family history is also positive for known GI malignancies. Her last colonoscopy was in [place] 2010 with removal of single polyp but it was hyperplastic. She has chronic constipation.  Informed Consent:  The procedure and risks were reviewed with the patient and informed consent was obtained.  Medications:  Monitored anesthesia care; please see anesthesia records for details.  Description of procedure:  After a digital rectal exam was performed, that colonoscope was advanced from the anus through the rectum and colon to the area of the cecum, ileocecal valve and appendiceal orifice. The cecum was deeply intubated. These structures were well-seen and photographed for the record. From the level of the cecum and ileocecal valve, the scope was slowly and cautiously withdrawn. The mucosal surfaces were carefully surveyed utilizing scope tip to flexion to facilitate fold flattening as needed. The scope was pulled down into the rectum where a thorough exam including retroflexion was performed.  Findings:   Prep excellent except she had some stool at cecum. Normal mucosa of cecum, ascending colon, hepatic flexure, transverse colon, splenic flexure, descending and sigmoid colon. 6 mm polyp hot snared from rectum. Small rectal polyp was coagulated with snare tip. Small hemorrhoids below the dentate line.    Therapeutic/Diagnostic Maneuvers Performed:  See above  Complications:  none  Cecal Withdrawal Time:  12 minutes  Impression:   Examination performed to cecum. 6 mm rectal polyp hot snared. Small rectal polyp was coagulated using snare tip. Small external hemorrhoids.  Recommendations:  Standard instructions given. Colace 200 mg by mouth daily at bedtime. Benefiber 4 g by mouth daily at bedtime. Abdominopelvic CT to be scheduled. I will contact patient with biopsy results and further recommendations.  REHMAN,NAJEEB U  06/24/2014 8:29 AM  CC: Dr. Celedonio Savage, MD & Dr. Rayne Du ref. provider found

## 2014-07-08 ENCOUNTER — Encounter (INDEPENDENT_AMBULATORY_CARE_PROVIDER_SITE_OTHER): Payer: Self-pay | Admitting: *Deleted

## 2014-12-30 ENCOUNTER — Ambulatory Visit (INDEPENDENT_AMBULATORY_CARE_PROVIDER_SITE_OTHER): Payer: Medicaid Other | Admitting: Internal Medicine

## 2015-01-11 ENCOUNTER — Encounter (INDEPENDENT_AMBULATORY_CARE_PROVIDER_SITE_OTHER): Payer: Self-pay | Admitting: Internal Medicine

## 2015-01-11 ENCOUNTER — Ambulatory Visit (INDEPENDENT_AMBULATORY_CARE_PROVIDER_SITE_OTHER): Payer: BLUE CROSS/BLUE SHIELD | Admitting: Internal Medicine

## 2015-01-11 ENCOUNTER — Encounter (INDEPENDENT_AMBULATORY_CARE_PROVIDER_SITE_OTHER): Payer: Self-pay | Admitting: *Deleted

## 2015-01-11 VITALS — BP 104/50 | HR 76 | Temp 98.1°F | Ht 66.0 in | Wt 367.7 lb

## 2015-01-11 DIAGNOSIS — F32A Depression, unspecified: Secondary | ICD-10-CM | POA: Insufficient documentation

## 2015-01-11 DIAGNOSIS — E119 Type 2 diabetes mellitus without complications: Secondary | ICD-10-CM | POA: Insufficient documentation

## 2015-01-11 DIAGNOSIS — F329 Major depressive disorder, single episode, unspecified: Secondary | ICD-10-CM | POA: Insufficient documentation

## 2015-01-11 DIAGNOSIS — R1900 Intra-abdominal and pelvic swelling, mass and lump, unspecified site: Secondary | ICD-10-CM | POA: Diagnosis not present

## 2015-01-11 DIAGNOSIS — F4310 Post-traumatic stress disorder, unspecified: Secondary | ICD-10-CM | POA: Insufficient documentation

## 2015-01-11 DIAGNOSIS — R140 Abdominal distension (gaseous): Secondary | ICD-10-CM

## 2015-01-11 DIAGNOSIS — I10 Essential (primary) hypertension: Secondary | ICD-10-CM | POA: Insufficient documentation

## 2015-01-11 DIAGNOSIS — F339 Major depressive disorder, recurrent, unspecified: Secondary | ICD-10-CM | POA: Insufficient documentation

## 2015-01-11 DIAGNOSIS — R609 Edema, unspecified: Secondary | ICD-10-CM | POA: Insufficient documentation

## 2015-01-11 DIAGNOSIS — E1169 Type 2 diabetes mellitus with other specified complication: Secondary | ICD-10-CM | POA: Insufficient documentation

## 2015-01-11 DIAGNOSIS — E7800 Pure hypercholesterolemia, unspecified: Secondary | ICD-10-CM | POA: Insufficient documentation

## 2015-01-11 NOTE — Patient Instructions (Signed)
CT abdomen with CM.  

## 2015-01-11 NOTE — Progress Notes (Addendum)
Subjective:    Patient ID: Kathryn Jimenez, female    DOB: [birth year], 44 y.o.   MRN: [id number] HPI Presents today with c/o mass to epigastric region since November. She says the area is painless.  She says the area is hard to the touch.  Appetite is not good. She has lost about from 409 to 367.7 over an 8 month period. She says she fell last year. She usually has a BM every other day. No melena or BRRB.  She says she does have hemorrhoids.  She says she has a lot of bloating.  BS running 130-200.   Occasionally has acid reflux. Family hx of half sister with hx of colon cancer.   11/08/2014 SGOT 20, SGPT 22, total bili 0.6,   07/04/2014 Colonoscopy:   Indications: Patient is 44 year old Caucasian female with multiple medical problems including morbid obesity who is undergoing high risk screening colonoscopy. Patient's half-sister was diagnosed with colon cancer at age 41. Family history is also positive for known GI malignancies. Her last colonoscopy was in [place] 2010 with removal of single polyp but it was hyperplastic. She has chronic constipation. Impression:  Examination performed to cecum. 6 mm rectal polyp hot snared. Small rectal polyp was coagulated using snare tip. Small external hemorrhoids.  Notes Recorded by Rogene Houston, MD on 06/29/2014 at 1:21 PM Patient has small polyp removed and it is hyperplastic. Patient called and message left on his answering service. Next colonoscopy in 5 years( family history)            Review of Systems Married. No children. Disabled. Past Medical History  Diagnosis Date  . PTSD (post-traumatic stress disorder)   . Anxiety   . Depression   . Borderline personality disorder   . Anginal pain   . Hypertension   . Neuromuscular disorder     Neuropathy  . Sleep apnea   . Shortness of breath   . Asthma   . Diabetes mellitus without complication   . Bladder disorder, other   . Kidney stones   . GERD (gastroesophageal reflux  disease)   . Headache   . Arthritis     Past Surgical History  Procedure Laterality Date  . Cholecystectomy    . Cervical fusion Bilateral 2003    C 5-6 with titanium screws  . Wisdom tooth extraction Bilateral 2001 and 1990s    top and bottom   . Urethral dilation  1981  . Colonoscopy with propofol N/A 06/24/2014    Procedure: COLONOSCOPY WITH PROPOFOL;  Surgeon: Rogene Houston, MD;  Location: [facility];  Service: Endoscopy;  Laterality: N/A;  cecum time in 0810    time out   0821    total time 11 minutes  . Polypectomy N/A 06/24/2014    Procedure: RECTAL POLYPECTOMY;  Surgeon: Rogene Houston, MD;  Location: [facility];  Service: Endoscopy;  Laterality: N/A;  . Carpal tunnel release      left 2015    Allergies  Allergen Reactions  . Ivp Dye [Iodinated Diagnostic Agents] Other (See Comments)    Passed out  . Augmentin [Amoxicillin-Pot Clavulanate] Nausea Only    Causes severe diarrhea  . Morphine And Related Itching and Rash    Current Outpatient Prescriptions on File Prior to Visit  Medication Sig Dispense Refill  . albuterol (PROVENTIL HFA;VENTOLIN HFA) 108 (90 BASE) MCG/ACT inhaler Inhale 2 puffs into the lungs 4 (four) times daily as needed for wheezing or shortness of breath.    Marland Kitchen  amLODipine (NORVASC) 5 MG tablet Take 5 mg by mouth daily.    Marland Kitchen atorvastatin (LIPITOR) 40 MG tablet Take 40 mg by mouth daily.    Marland Kitchen buPROPion (WELLBUTRIN SR) 150 MG 12 hr tablet Take 150 mg by mouth 2 (two) times daily.    . Carboxymethylcellul-Glycerin (REFRESH OPTIVE OP) Apply 1 drop to eye daily as needed (dry eyes).    Marland Kitchen docusate sodium (COLACE) 100 MG capsule Take 2 capsules (200 mg total) by mouth at bedtime.  0  . furosemide (LASIX) 40 MG tablet Take 40 mg by mouth daily.    . insulin aspart (NOVOLOG) 100 UNIT/ML injection Inject 50 Units into the skin 3 (three) times daily before meals.    . insulin NPH Human (HUMULIN N,NOVOLIN N) 100 UNIT/ML injection Inject 65-75 Units into the skin 3 (three)  times daily. 75 units in the morning, 75 units at lunch, 65 units at bedtime.    Marland Kitchen lisinopril (PRINIVIL,ZESTRIL) 20 MG tablet Take 20 mg by mouth daily.    Marland Kitchen LORazepam (ATIVAN) 0.5 MG tablet Take 0.5 mg by mouth 3 (three) times daily.    . medroxyPROGESTERone (PROVERA) 10 MG tablet Take 10 mg by mouth See admin instructions. Once daily for the first 10 days of the month, every other month    . metFORMIN (GLUCOPHAGE-XR) 500 MG 24 hr tablet Take 500 mg by mouth 2 (two) times daily.    . potassium chloride (K-DUR) 10 MEQ tablet Take 20 mEq by mouth daily.    . pregabalin (LYRICA) 225 MG capsule Take 225 mg by mouth 3 (three) times daily.    Marland Kitchen venlafaxine XR (EFFEXOR-XR) 150 MG 24 hr capsule Take 150 mg by mouth 2 (two) times daily.    Marland Kitchen nystatin (MYCOSTATIN/NYSTOP) 100000 UNIT/GM POWD Apply 60 g topically 3 (three) times daily.    . prednisoLONE acetate (PRED FORTE) 1 % ophthalmic suspension Place 1 drop into both eyes 4 (four) times daily.     No current facility-administered medications on file prior to visit.         Objective:   Physical Exam Blood pressure 104/50, pulse 76, temperature 98.1 F (36.7 C), height 5\' 6"  (1.676 m), weight 367 lb 11.2 oz (166.788 kg). Alert and oriented. Skin warm and dry. Oral mucosa is moist.   . Sclera anicteric, conjunctivae is pink. Thyroid not enlarged. No cervical lymphadenopathy. Lungs clear. Heart regular rate and rhythm.  Abdomen is soft. Firmness noted to mid abdomen. Area is really not hard.  Bowel sounds are positive. No hepatomegaly. No abdominal masses felt. No tenderness.  No edema to lower extremities.          Assessment & Plan:  Mid abdominal mass?. Will get a CT abdomen. Further recommendations to follow.

## 2015-01-12 ENCOUNTER — Telehealth (INDEPENDENT_AMBULATORY_CARE_PROVIDER_SITE_OTHER): Payer: Self-pay | Admitting: *Deleted

## 2015-01-12 LAB — TISSUE TRANSGLUTAMINASE, IGA: Tissue Transglutaminase Ab, IgA: 1 U/mL (ref <4)

## 2015-01-12 LAB — GLIADIN ANTIBODIES, SERUM
Gliadin IgA: 6 Units (ref <20)
Gliadin IgG: 1 Units (ref <20)

## 2015-01-12 NOTE — Telephone Encounter (Signed)
Says she cannot void sometimes until she has a BM. She had a normal colonoscopy in 2015.

## 2015-01-12 NOTE — Telephone Encounter (Signed)
Kathryn Jimenez has something to tell [name] that she forgot to mention yesterday at her office visit. Please return her call at [phone].

## 2015-01-13 ENCOUNTER — Encounter (INDEPENDENT_AMBULATORY_CARE_PROVIDER_SITE_OTHER): Payer: Self-pay

## 2015-01-14 LAB — RETICULIN ANTIBODIES, IGA W TITER: Reticulin Ab, IgA: NEGATIVE

## 2015-01-18 ENCOUNTER — Other Ambulatory Visit (HOSPITAL_COMMUNITY): Payer: Self-pay | Admitting: Nurse Practitioner

## 2015-01-18 ENCOUNTER — Ambulatory Visit (HOSPITAL_COMMUNITY)
Admission: RE | Admit: 2015-01-18 | Discharge: 2015-01-18 | Disposition: A | Discharge status: Home / Self Care | Payer: BLUE CROSS/BLUE SHIELD | Source: Ambulatory Visit | Attending: Internal Medicine | Admitting: Internal Medicine

## 2015-01-18 ENCOUNTER — Encounter (HOSPITAL_COMMUNITY): Payer: Self-pay

## 2015-01-18 DIAGNOSIS — R1906 Epigastric swelling, mass or lump: Secondary | ICD-10-CM | POA: Insufficient documentation

## 2015-01-18 DIAGNOSIS — R1900 Intra-abdominal and pelvic swelling, mass and lump, unspecified site: Secondary | ICD-10-CM

## 2015-01-18 DIAGNOSIS — K760 Fatty (change of) liver, not elsewhere classified: Secondary | ICD-10-CM | POA: Insufficient documentation

## 2015-01-18 DIAGNOSIS — Z9049 Acquired absence of other specified parts of digestive tract: Secondary | ICD-10-CM | POA: Diagnosis not present

## 2015-01-18 DIAGNOSIS — R634 Abnormal weight loss: Secondary | ICD-10-CM | POA: Diagnosis not present

## 2015-01-18 DIAGNOSIS — R110 Nausea: Secondary | ICD-10-CM | POA: Diagnosis not present

## 2015-01-18 LAB — POCT I-STAT CREATININE: Creatinine, Ser: 0.7 mg/dL (ref 0.44–1.00)

## 2015-01-18 MED ORDER — IOHEXOL 300 MG/ML  SOLN
150.0000 mL | Freq: Once | INTRAMUSCULAR | Status: AC | PRN
  Administered 2015-01-18: 150 mL via INTRAVENOUS

## 2015-03-23 ENCOUNTER — Ambulatory Visit (INDEPENDENT_AMBULATORY_CARE_PROVIDER_SITE_OTHER): Payer: BLUE CROSS/BLUE SHIELD | Admitting: Internal Medicine

## 2015-11-22 DIAGNOSIS — F4310 Post-traumatic stress disorder, unspecified: Secondary | ICD-10-CM | POA: Diagnosis not present

## 2015-11-23 DIAGNOSIS — L601 Onycholysis: Secondary | ICD-10-CM | POA: Diagnosis not present

## 2015-12-01 ENCOUNTER — Encounter (INDEPENDENT_AMBULATORY_CARE_PROVIDER_SITE_OTHER): Payer: Self-pay | Admitting: *Deleted

## 2015-12-26 ENCOUNTER — Ambulatory Visit (INDEPENDENT_AMBULATORY_CARE_PROVIDER_SITE_OTHER): Payer: BLUE CROSS/BLUE SHIELD | Admitting: Internal Medicine

## 2015-12-26 ENCOUNTER — Encounter (INDEPENDENT_AMBULATORY_CARE_PROVIDER_SITE_OTHER): Payer: Self-pay

## 2015-12-26 ENCOUNTER — Encounter (INDEPENDENT_AMBULATORY_CARE_PROVIDER_SITE_OTHER): Payer: Self-pay | Admitting: Internal Medicine

## 2015-12-26 VITALS — BP 140/60 | HR 88 | Temp 98.9°F | Ht 66.0 in | Wt 375.3 lb

## 2015-12-26 DIAGNOSIS — R197 Diarrhea, unspecified: Secondary | ICD-10-CM | POA: Diagnosis not present

## 2015-12-26 MED ORDER — METRONIDAZOLE 500 MG PO TABS: 500.0000 mg | ORAL_TABLET | Freq: Three times a day (TID) | ORAL | Status: DC

## 2015-12-26 NOTE — Progress Notes (Signed)
Subjective:    Patient ID: Kathryn Jimenez, female    DOB: 23-May-1971, 45 y.o.   MRN: [id number]  HPI Referred by Dr. Wenda Overland for diarrhea and nausea. She has had diarrhea which comes and goes. Diarrhea started about a month ago. Symptoms started after taking ? antibiotic and the anti fungal for her toes. Took antibiotic for a fungal infecton.  She is having a stool everyday or every other day. Sometimes her stools are formed and sometimes they are not.  Sometimes she passes mucous. She has not had any rectal bleeding. She has not had any nausea for about 3 weeks.  Her appetite is okay. She has not lost any weight.  There is no abdominal pain. She occasionally has acid reflux. If she bends over, she will have acid reflux come up in her esophagus. Diabetic since 1999. BS run 140-200   11/17/2015 C diff negative.    11/24/2015 ALP 112, SGOT 35.3, SGPT 33, total bili 0.3,     07/04/2014 Colonoscopy:  Indications: Patient is 45 year old Caucasian female with multiple medical problems including morbid obesity who is undergoing high risk screening colonoscopy. Patient's half-sister was diagnosed with colon cancer at age 74. Family history is also positive for known GI malignancies. Her last colonoscopy was in [place] 2010 with removal of single polyp but it was hyperplastic. She has chronic constipation. Impression:  Examination performed to cecum.  6 mm rectal polyp hot snared. Small rectal polyp was coagulated using snare tip. Small external hemorrhoids.  Notes Recorded by Rogene Houston, MD on 06/29/2014 at 1:21 PM Patient has small polyp removed and it is hyperplastic. Patient called and message left on his answering service. Next colonoscopy in 5 years( family history)            Review of Systems Past Medical History  Diagnosis Date  . PTSD (post-traumatic stress disorder)   . Anxiety   . Depression   . Borderline personality disorder   . Anginal pain ([place])   .  Hypertension   . Neuromuscular disorder ([facility])     Neuropathy  . Sleep apnea   . Shortness of breath   . Asthma   . Diabetes mellitus without complication ([place])   . Bladder disorder, other   . Kidney stones   . GERD (gastroesophageal reflux disease)   . Headache   . Arthritis     Past Surgical History  Procedure Laterality Date  . Cholecystectomy    . Cervical fusion Bilateral 2003    C 5-6 with titanium screws  . Wisdom tooth extraction Bilateral 2001 and 1990s    top and bottom   . Urethral dilation  1981  . Colonoscopy with propofol N/A 06/24/2014    Procedure: COLONOSCOPY WITH PROPOFOL;  Surgeon: Rogene Houston, MD;  Location: [facility];  Service: Endoscopy;  Laterality: N/A;  cecum time in 0810    time out   0821    total time 11 minutes  . Polypectomy N/A 06/24/2014    Procedure: RECTAL POLYPECTOMY;  Surgeon: Rogene Houston, MD;  Location: [facility];  Service: Endoscopy;  Laterality: N/A;  . Carpal tunnel release      left 2015    Allergies  Allergen Reactions  . Augmentin [Amoxicillin-Pot Clavulanate] Nausea Only    Causes severe diarrhea  . Morphine And Related Itching and Rash    Current Outpatient Prescriptions on File Prior to Visit  Medication Sig Dispense Refill  . albuterol (PROVENTIL HFA;VENTOLIN HFA) 108 (90 BASE)  MCG/ACT inhaler Inhale 2 puffs into the lungs 4 (four) times daily as needed for wheezing or shortness of breath.    Marland Kitchen buPROPion (WELLBUTRIN SR) 150 MG 12 hr tablet Take 150 mg by mouth 2 (two) times daily.    Marland Kitchen docusate sodium (COLACE) 100 MG capsule Take 2 capsules (200 mg total) by mouth at bedtime.  0  . furosemide (LASIX) 40 MG tablet Take 40 mg by mouth daily.    . insulin aspart (NOVOLOG) 100 UNIT/ML injection Inject 50 Units into the skin 3 (three) times daily before meals. ro in am , 50 and lunc and 50 pm    . insulin NPH Human (HUMULIN N,NOVOLIN N) 100 UNIT/ML injection Inject into the skin 3 (three) times daily. 75 units in the morning, 75  units at lunch, 60 units at bedtime.    Marland Kitchen lisinopril (PRINIVIL,ZESTRIL) 20 MG tablet Take 20 mg by mouth daily.    Marland Kitchen LORazepam (ATIVAN) 0.5 MG tablet Take 0.5 mg by mouth 3 (three) times daily.    . medroxyPROGESTERone (PROVERA) 10 MG tablet Take 10 mg by mouth See admin instructions. Reported on 12/26/2015    . metFORMIN (GLUCOPHAGE-XR) 500 MG 24 hr tablet Take 500 mg by mouth 2 (two) times daily.    . potassium chloride (K-DUR) 10 MEQ tablet Take 20 mEq by mouth daily.    . pregabalin (LYRICA) 225 MG capsule Take 225 mg by mouth 3 (three) times daily.    Marland Kitchen venlafaxine XR (EFFEXOR-XR) 150 MG 24 hr capsule Take 150 mg by mouth 2 (two) times daily.    . vitamin B-12 (CYANOCOBALAMIN) 1000 MCG tablet Take 1,000 mcg by mouth daily.     No current facility-administered medications on file prior to visit.        Objective:   Physical Exam Blood pressure 140/60, pulse 88, temperature 98.9 F (37.2 C), height 5\' 6"  (1.676 m), weight 375 lb 4.8 oz (170.235 kg). Alert and oriented. Skin warm and dry. Oral mucosa is moist.   . Sclera anicteric, conjunctivae is pink. Thyroid not enlarged. No cervical lymphadenopathy. Lungs clear. Heart regular rate and rhythm.  Abdomen is soft. Bowel sounds are positive. No hepatomegaly. No abdominal masses felt. No tenderness.  No edema to lower extremities.  Morbidly obese.        Assessment & Plan:  Diarrhea. Better now. Will get a GI pathogen, OVA and para. Further recommendations to follow.  Flagyl 500mg  TID x 10 days after she get stool specimen Encouraged to diet and exercise.

## 2015-12-26 NOTE — Patient Instructions (Addendum)
OVA and para. GI pathogen.  Increase fiber in diet.

## 2015-12-27 DIAGNOSIS — F4310 Post-traumatic stress disorder, unspecified: Secondary | ICD-10-CM | POA: Diagnosis not present

## 2015-12-29 ENCOUNTER — Telehealth (INDEPENDENT_AMBULATORY_CARE_PROVIDER_SITE_OTHER): Payer: Self-pay | Admitting: Internal Medicine

## 2015-12-29 DIAGNOSIS — R197 Diarrhea, unspecified: Secondary | ICD-10-CM | POA: Diagnosis not present

## 2015-12-29 NOTE — Telephone Encounter (Signed)
I have talked with patient  

## 2015-12-29 NOTE — Telephone Encounter (Signed)
Patient called, left a message.  She stated that she is supposed to do a stool kit.  Says she has questions about the stool kit and labs.  She stated that she wants to know what she needs to do.  [phone]

## 2016-01-02 ENCOUNTER — Telehealth (INDEPENDENT_AMBULATORY_CARE_PROVIDER_SITE_OTHER): Payer: Self-pay | Admitting: Internal Medicine

## 2016-01-02 LAB — OVA AND PARASITE EXAMINATION: OP: NONE SEEN

## 2016-01-02 NOTE — Telephone Encounter (Signed)
Called and told she can start the antibiotics

## 2016-01-02 NOTE — Telephone Encounter (Signed)
Patient called, left a message, she wants to know if she is supposed to take the antibiotics before her results from stool test.  She would like a call back.  [phone]

## 2016-01-03 LAB — GASTROINTESTINAL PATHOGEN PANEL PCR
C. difficile Tox A/B, PCR: NOT DETECTED
Campylobacter, PCR: NOT DETECTED
Cryptosporidium, PCR: NOT DETECTED
E coli (ETEC) LT/ST PCR: NOT DETECTED
E coli (STEC) stx1/stx2, PCR: NOT DETECTED
E coli 0157, PCR: NOT DETECTED
Giardia lamblia, PCR: NOT DETECTED
Norovirus, PCR: NOT DETECTED
Rotavirus A, PCR: NOT DETECTED
Salmonella, PCR: NOT DETECTED
Shigella, PCR: NOT DETECTED

## 2016-01-09 DIAGNOSIS — L219 Seborrheic dermatitis, unspecified: Secondary | ICD-10-CM | POA: Diagnosis not present

## 2016-01-09 DIAGNOSIS — L570 Actinic keratosis: Secondary | ICD-10-CM | POA: Diagnosis not present

## 2016-01-10 ENCOUNTER — Telehealth (INDEPENDENT_AMBULATORY_CARE_PROVIDER_SITE_OTHER): Payer: Self-pay | Admitting: Internal Medicine

## 2016-01-10 DIAGNOSIS — F4310 Post-traumatic stress disorder, unspecified: Secondary | ICD-10-CM | POA: Diagnosis not present

## 2016-01-10 NOTE — Telephone Encounter (Signed)
Patient called, stated that she was supposed to call [name] and let her know how she was doing.  She stated that her stool is more formed.  Occasionally has a real soft stool.  She stated that's all she can tell you right now.  [phone]

## 2016-01-10 NOTE — Telephone Encounter (Signed)
noted 

## 2016-02-27 DIAGNOSIS — F4310 Post-traumatic stress disorder, unspecified: Secondary | ICD-10-CM | POA: Diagnosis not present

## 2016-02-28 DIAGNOSIS — F4310 Post-traumatic stress disorder, unspecified: Secondary | ICD-10-CM | POA: Diagnosis not present

## 2016-03-01 DIAGNOSIS — E7800 Pure hypercholesterolemia, unspecified: Secondary | ICD-10-CM | POA: Diagnosis not present

## 2016-03-01 DIAGNOSIS — G609 Hereditary and idiopathic neuropathy, unspecified: Secondary | ICD-10-CM | POA: Diagnosis not present

## 2016-03-01 DIAGNOSIS — E1165 Type 2 diabetes mellitus with hyperglycemia: Secondary | ICD-10-CM | POA: Diagnosis not present

## 2016-03-01 DIAGNOSIS — I10 Essential (primary) hypertension: Secondary | ICD-10-CM | POA: Diagnosis not present

## 2016-03-08 DIAGNOSIS — H40053 Ocular hypertension, bilateral: Secondary | ICD-10-CM | POA: Diagnosis not present

## 2016-03-14 DIAGNOSIS — G4733 Obstructive sleep apnea (adult) (pediatric): Secondary | ICD-10-CM | POA: Diagnosis not present

## 2016-03-20 DIAGNOSIS — F4310 Post-traumatic stress disorder, unspecified: Secondary | ICD-10-CM | POA: Diagnosis not present

## 2016-03-21 DIAGNOSIS — R5382 Chronic fatigue, unspecified: Secondary | ICD-10-CM | POA: Diagnosis not present

## 2016-03-21 DIAGNOSIS — Z0000 Encounter for general adult medical examination without abnormal findings: Secondary | ICD-10-CM | POA: Diagnosis not present

## 2016-04-12 DIAGNOSIS — H04123 Dry eye syndrome of bilateral lacrimal glands: Secondary | ICD-10-CM | POA: Diagnosis not present

## 2016-04-24 DIAGNOSIS — F4310 Post-traumatic stress disorder, unspecified: Secondary | ICD-10-CM | POA: Diagnosis not present

## 2016-05-21 DIAGNOSIS — F4310 Post-traumatic stress disorder, unspecified: Secondary | ICD-10-CM | POA: Diagnosis not present

## 2016-06-01 DIAGNOSIS — F6089 Other specific personality disorders: Secondary | ICD-10-CM | POA: Diagnosis not present

## 2016-06-25 DIAGNOSIS — F329 Major depressive disorder, single episode, unspecified: Secondary | ICD-10-CM | POA: Diagnosis not present

## 2016-07-03 DIAGNOSIS — G609 Hereditary and idiopathic neuropathy, unspecified: Secondary | ICD-10-CM | POA: Diagnosis not present

## 2016-07-03 DIAGNOSIS — I10 Essential (primary) hypertension: Secondary | ICD-10-CM | POA: Diagnosis not present

## 2016-07-03 DIAGNOSIS — E7800 Pure hypercholesterolemia, unspecified: Secondary | ICD-10-CM | POA: Diagnosis not present

## 2016-07-03 DIAGNOSIS — E1165 Type 2 diabetes mellitus with hyperglycemia: Secondary | ICD-10-CM | POA: Diagnosis not present

## 2016-07-03 DIAGNOSIS — Z23 Encounter for immunization: Secondary | ICD-10-CM | POA: Diagnosis not present

## 2016-07-03 DIAGNOSIS — E669 Obesity, unspecified: Secondary | ICD-10-CM | POA: Diagnosis not present

## 2016-08-20 DIAGNOSIS — I639 Cerebral infarction, unspecified: Secondary | ICD-10-CM

## 2016-08-20 HISTORY — DX: Cerebral infarction, unspecified: I63.9

## 2016-08-28 DIAGNOSIS — R5383 Other fatigue: Secondary | ICD-10-CM | POA: Diagnosis not present

## 2016-08-28 DIAGNOSIS — R300 Dysuria: Secondary | ICD-10-CM | POA: Diagnosis not present

## 2016-08-28 DIAGNOSIS — R109 Unspecified abdominal pain: Secondary | ICD-10-CM | POA: Diagnosis not present

## 2016-08-31 DIAGNOSIS — J45909 Unspecified asthma, uncomplicated: Secondary | ICD-10-CM | POA: Diagnosis not present

## 2016-08-31 DIAGNOSIS — Z981 Arthrodesis status: Secondary | ICD-10-CM | POA: Diagnosis not present

## 2016-08-31 DIAGNOSIS — E1140 Type 2 diabetes mellitus with diabetic neuropathy, unspecified: Secondary | ICD-10-CM | POA: Diagnosis not present

## 2016-08-31 DIAGNOSIS — Z794 Long term (current) use of insulin: Secondary | ICD-10-CM | POA: Diagnosis not present

## 2016-08-31 DIAGNOSIS — F419 Anxiety disorder, unspecified: Secondary | ICD-10-CM | POA: Diagnosis not present

## 2016-08-31 DIAGNOSIS — Z79899 Other long term (current) drug therapy: Secondary | ICD-10-CM | POA: Diagnosis not present

## 2016-08-31 DIAGNOSIS — I10 Essential (primary) hypertension: Secondary | ICD-10-CM | POA: Diagnosis not present

## 2016-08-31 DIAGNOSIS — R002 Palpitations: Secondary | ICD-10-CM | POA: Diagnosis not present

## 2016-08-31 DIAGNOSIS — E1165 Type 2 diabetes mellitus with hyperglycemia: Secondary | ICD-10-CM | POA: Diagnosis not present

## 2016-08-31 DIAGNOSIS — R42 Dizziness and giddiness: Secondary | ICD-10-CM | POA: Diagnosis not present

## 2016-08-31 DIAGNOSIS — R531 Weakness: Secondary | ICD-10-CM | POA: Diagnosis not present

## 2016-08-31 DIAGNOSIS — R200 Anesthesia of skin: Secondary | ICD-10-CM | POA: Diagnosis not present

## 2016-09-10 DIAGNOSIS — G542 Cervical root disorders, not elsewhere classified: Secondary | ICD-10-CM | POA: Diagnosis not present

## 2016-09-14 DIAGNOSIS — R109 Unspecified abdominal pain: Secondary | ICD-10-CM | POA: Diagnosis not present

## 2016-09-14 DIAGNOSIS — R3129 Other microscopic hematuria: Secondary | ICD-10-CM | POA: Diagnosis not present

## 2016-09-14 DIAGNOSIS — R300 Dysuria: Secondary | ICD-10-CM | POA: Diagnosis not present

## 2016-09-19 DIAGNOSIS — I10 Essential (primary) hypertension: Secondary | ICD-10-CM | POA: Diagnosis not present

## 2016-09-19 DIAGNOSIS — E1165 Type 2 diabetes mellitus with hyperglycemia: Secondary | ICD-10-CM | POA: Diagnosis not present

## 2016-09-19 DIAGNOSIS — Z6841 Body Mass Index (BMI) 40.0 and over, adult: Secondary | ICD-10-CM | POA: Diagnosis not present

## 2016-09-19 DIAGNOSIS — Z01419 Encounter for gynecological examination (general) (routine) without abnormal findings: Secondary | ICD-10-CM | POA: Diagnosis not present

## 2016-10-16 DIAGNOSIS — Z8041 Family history of malignant neoplasm of ovary: Secondary | ICD-10-CM | POA: Diagnosis not present

## 2016-10-16 DIAGNOSIS — Z803 Family history of malignant neoplasm of breast: Secondary | ICD-10-CM | POA: Diagnosis not present

## 2016-10-16 DIAGNOSIS — Z800 Family history of malignant neoplasm of digestive organs: Secondary | ICD-10-CM | POA: Diagnosis not present

## 2016-10-17 DIAGNOSIS — F329 Major depressive disorder, single episode, unspecified: Secondary | ICD-10-CM | POA: Diagnosis not present

## 2016-11-01 DIAGNOSIS — L03116 Cellulitis of left lower limb: Secondary | ICD-10-CM | POA: Diagnosis not present

## 2016-11-09 DIAGNOSIS — R609 Edema, unspecified: Secondary | ICD-10-CM | POA: Diagnosis not present

## 2016-11-19 DIAGNOSIS — Z79899 Other long term (current) drug therapy: Secondary | ICD-10-CM | POA: Diagnosis not present

## 2016-11-19 DIAGNOSIS — J45909 Unspecified asthma, uncomplicated: Secondary | ICD-10-CM | POA: Diagnosis not present

## 2016-11-19 DIAGNOSIS — F329 Major depressive disorder, single episode, unspecified: Secondary | ICD-10-CM | POA: Diagnosis not present

## 2016-11-19 DIAGNOSIS — R200 Anesthesia of skin: Secondary | ICD-10-CM | POA: Diagnosis not present

## 2016-11-19 DIAGNOSIS — R414 Neurologic neglect syndrome: Secondary | ICD-10-CM | POA: Diagnosis not present

## 2016-11-19 DIAGNOSIS — E119 Type 2 diabetes mellitus without complications: Secondary | ICD-10-CM | POA: Diagnosis not present

## 2016-11-19 DIAGNOSIS — F450 Somatization disorder: Secondary | ICD-10-CM | POA: Diagnosis not present

## 2016-11-19 DIAGNOSIS — I10 Essential (primary) hypertension: Secondary | ICD-10-CM | POA: Diagnosis not present

## 2016-11-19 DIAGNOSIS — Z794 Long term (current) use of insulin: Secondary | ICD-10-CM | POA: Diagnosis not present

## 2016-11-22 DIAGNOSIS — Z6841 Body Mass Index (BMI) 40.0 and over, adult: Secondary | ICD-10-CM | POA: Diagnosis not present

## 2016-11-22 DIAGNOSIS — Z803 Family history of malignant neoplasm of breast: Secondary | ICD-10-CM | POA: Diagnosis not present

## 2016-11-26 DIAGNOSIS — R200 Anesthesia of skin: Secondary | ICD-10-CM | POA: Diagnosis not present

## 2016-11-26 DIAGNOSIS — R29898 Other symptoms and signs involving the musculoskeletal system: Secondary | ICD-10-CM | POA: Diagnosis not present

## 2016-11-28 DIAGNOSIS — N644 Mastodynia: Secondary | ICD-10-CM | POA: Diagnosis not present

## 2016-11-28 DIAGNOSIS — R928 Other abnormal and inconclusive findings on diagnostic imaging of breast: Secondary | ICD-10-CM | POA: Diagnosis not present

## 2016-11-30 DIAGNOSIS — N765 Ulceration of vagina: Secondary | ICD-10-CM | POA: Diagnosis not present

## 2016-11-30 DIAGNOSIS — N9489 Other specified conditions associated with female genital organs and menstrual cycle: Secondary | ICD-10-CM | POA: Diagnosis not present

## 2016-11-30 DIAGNOSIS — Z6841 Body Mass Index (BMI) 40.0 and over, adult: Secondary | ICD-10-CM | POA: Diagnosis not present

## 2016-11-30 DIAGNOSIS — N898 Other specified noninflammatory disorders of vagina: Secondary | ICD-10-CM | POA: Diagnosis not present

## 2016-12-07 DIAGNOSIS — N3942 Incontinence without sensory awareness: Secondary | ICD-10-CM | POA: Diagnosis not present

## 2016-12-07 DIAGNOSIS — N393 Stress incontinence (female) (male): Secondary | ICD-10-CM | POA: Diagnosis not present

## 2016-12-13 DIAGNOSIS — F6089 Other specific personality disorders: Secondary | ICD-10-CM | POA: Diagnosis not present

## 2016-12-13 DIAGNOSIS — N920 Excessive and frequent menstruation with regular cycle: Secondary | ICD-10-CM | POA: Diagnosis not present

## 2016-12-18 ENCOUNTER — Encounter (INDEPENDENT_AMBULATORY_CARE_PROVIDER_SITE_OTHER): Payer: Self-pay

## 2016-12-18 ENCOUNTER — Ambulatory Visit (INDEPENDENT_AMBULATORY_CARE_PROVIDER_SITE_OTHER): Payer: BLUE CROSS/BLUE SHIELD | Admitting: Neurology

## 2016-12-18 ENCOUNTER — Encounter: Payer: Self-pay | Admitting: Neurology

## 2016-12-18 VITALS — BP 152/75 | HR 93 | Ht 66.0 in | Wt 350.0 lb

## 2016-12-18 DIAGNOSIS — Z794 Long term (current) use of insulin: Secondary | ICD-10-CM | POA: Diagnosis not present

## 2016-12-18 DIAGNOSIS — F4310 Post-traumatic stress disorder, unspecified: Secondary | ICD-10-CM

## 2016-12-18 DIAGNOSIS — R202 Paresthesia of skin: Secondary | ICD-10-CM | POA: Diagnosis not present

## 2016-12-18 DIAGNOSIS — E1110 Type 2 diabetes mellitus with ketoacidosis without coma: Secondary | ICD-10-CM | POA: Diagnosis not present

## 2016-12-18 DIAGNOSIS — I10 Essential (primary) hypertension: Secondary | ICD-10-CM | POA: Diagnosis not present

## 2016-12-18 NOTE — Progress Notes (Signed)
PATIENT: Kathryn Jimenez DOB: 1970/10/02  Chief Complaint  Patient presents with  . Facial numbness/Right arm and leg weakness    She is here with her husband, [name], to have her right-sided numbness and weakness further evaluated.  Marland Kitchen PCP    Celedonio Savage, MD     HISTORICAL  Nusaybah Sabey is 46 years old right-handed female, seen in refer by her primary care physician Celedonio Savage for evaluation of facial numbness, right arm and leg weakness, initial evaluation was only 1 2018.  I reviewed and summarized most recent office visit, she had past medical history of obesity, type 2 diabetes since 1999, insulin-dependent 2007, diabetic peripheral neuropathy, bilateral carpal tunnel syndromes, last A1c was 7.8, chronic neck, low back pain, depression anxiety, PTSD, on disability because of her mental health, she was followed up by psychiatrist, obstructive sleep apnea, using CPAP machine, asthma, irritable bowel syndrome, she also reported a history of right cervical decompression surgery in 2003, she presented with severe right cervical radiculopathy surgery has been very helpful,  She had baseline bilateral lower extremity paresthesia below ankle, intermittent bilateral hands paresthesia due to her carpal tunnel syndromes, but on January 12th 2018 she had a set onset numbness involving her right arm, right face, right flank, right lower extremity, which has been persistent since its onset, she also noticed mild weakness, difficulty to make a tight grip with her right hand,  She was evaluated by [facility] the day of symptoms onset on January twelfth 2018, she presented to the emergency room again on September 21 2016 with her persistent right sided paresthesia, she complains of gait abnormality because of her obesity, previous bilateral ankle injury, especially right ankle  I was able to review the record on [place] 2 2018, CT head showed no significant abnormality, MRI of the brain was attempted but  she could not feed in the machine because of her weight,   vital signs upon presentation was 136/70  Laboratory evaluations, normal CMP creatinine of 0.53, with exception of elevated glucose 218, normal CBC hemoglobin of 12 point 6,  REVIEW OF SYSTEMS: Full 14 system review of systems performed and notable only for weight loss, fatigue, palpitation, swelling legs, rash, itching, moles, blurry vision, double vision, cough, snoring, diarrhea, constipation, urination problems, incontinence, feeling hot, feeling cold, increased thirst, flushing, joint pain, swelling, cramps, allergy, skin sensitivity, frequent infection, memory loss, confusion, numbness, headaches, weakness, difficulty swallowing, dizziness, insomnia, snoring, restless leg, depression, anxiety, not enough sleep, decreased energy, change in appetite, disinteresting activities.  ALLERGIES: Allergies  Allergen Reactions  . Augmentin [Amoxicillin-Pot Clavulanate] Nausea Only    Causes severe diarrhea  . Morphine And Related Itching and Rash    HOME MEDICATIONS: Current Outpatient Prescriptions  Medication Sig Dispense Refill  . acetaminophen (TYLENOL) 500 MG tablet Take 500 mg by mouth every 6 (six) hours as needed.    Marland Kitchen albuterol (PROVENTIL HFA;VENTOLIN HFA) 108 (90 BASE) MCG/ACT inhaler Inhale 2 puffs into the lungs 4 (four) times daily as needed for wheezing or shortness of breath.    Marland Kitchen b complex vitamins capsule Take 1 capsule by mouth daily.    Marland Kitchen buPROPion (WELLBUTRIN XL) 150 MG 24 hr tablet Take 150 mg by mouth daily.    . clotrimazole (LOTRIMIN) 1 % cream Apply 1 application topically as needed.     . docusate sodium (COLACE) 100 MG capsule Take 2 capsules (200 mg total) by mouth at bedtime. (Patient taking differently: Take 200 mg by mouth as  needed. )  0  . doxepin (SINEQUAN) 10 MG capsule Take 10 mg by mouth 3 (three) times daily.     Marland Kitchen FLUoxetine (PROZAC) 20 MG capsule Take 20 mg by mouth daily.    . furosemide (LASIX)  40 MG tablet Take 40 mg by mouth daily.    . insulin aspart (NOVOLOG) 100 UNIT/ML injection Inject 50 Units into the skin 3 (three) times daily before meals. ro in am , 50 and lunc and 50 pm    . insulin NPH Human (HUMULIN N,NOVOLIN N) 100 UNIT/ML injection Inject into the skin 3 (three) times daily. 75 units in the morning, 75 units at lunch, 60 units at bedtime.    Marland Kitchen lisinopril (PRINIVIL,ZESTRIL) 20 MG tablet Take 20 mg by mouth daily.    Marland Kitchen LORazepam (ATIVAN) 0.5 MG tablet Take 0.5 mg by mouth. Takes 0.25mg  - 0.50mg , 1-2 time daily as needed.    . medroxyPROGESTERone (PROVERA) 10 MG tablet Take 10 mg by mouth See admin instructions. Reported on 12/26/2015    . potassium chloride (K-DUR) 10 MEQ tablet Take 20 mEq by mouth daily.    . pregabalin (LYRICA) 225 MG capsule Take 225 mg by mouth 3 (three) times daily.    Marland Kitchen terbinafine (LAMISIL) 250 MG tablet Take 250 mg by mouth daily.    . valACYclovir (VALTREX) 500 MG tablet Take 500 mg by mouth daily.    . vitamin B-12 (CYANOCOBALAMIN) 1000 MCG tablet Take 1,000 mcg by mouth daily.     No current facility-administered medications for this visit.     PAST MEDICAL HISTORY: Past Medical History:  Diagnosis Date  . Anginal pain ([place])   . Anxiety   . Arthritis   . Asthma   . Bladder disorder, other   . Borderline personality disorder   . Depression   . Diabetes mellitus without complication ([place])   . GERD (gastroesophageal reflux disease)   . Headache   . Hypertension   . Kidney stones   . Neuromuscular disorder ([facility])    Neuropathy  . PTSD (post-traumatic stress disorder)   . Shortness of breath   . Sleep apnea     PAST SURGICAL HISTORY: Past Surgical History:  Procedure Laterality Date  . CARPAL TUNNEL RELEASE     left 2015  . CERVICAL FUSION Bilateral 2003   C 5-6 with titanium screws  . CHOLECYSTECTOMY    . COLONOSCOPY WITH PROPOFOL N/A 06/24/2014   Procedure: COLONOSCOPY WITH PROPOFOL;  Surgeon: Rogene Houston, MD;  Location:  [facility];  Service: Endoscopy;  Laterality: N/A;  cecum time in 0810    time out   0821    total time 11 minutes  . POLYPECTOMY N/A 06/24/2014   Procedure: RECTAL POLYPECTOMY;  Surgeon: Rogene Houston, MD;  Location: [facility];  Service: Endoscopy;  Laterality: N/A;  . [place]  . WISDOM TOOTH EXTRACTION Bilateral 2001 and 1990s   top and bottom     FAMILY HISTORY: Family History  Problem Relation Age of Onset  . COPD Mother   . Lung cancer Father   . Colon cancer Sister   . Breast cancer Sister     SOCIAL HISTORY:  Social History   Social History  . Marital status: Married    Spouse name: N/A  . Number of children: 0  . Years of education: HS   Occupational History  . Disabled    Social History Main Topics  . Smoking status: Never Smoker  .  Smokeless tobacco: Never Used  . Alcohol use Yes     Comment: Rarely  . Drug use: No  . Sexual activity: Yes    Birth control/ protection: Pill   Other Topics Concern  . Not on file   Social History Narrative   Lives at home with husband.   Right-handed.   3-4 cups caffeine per day.        PHYSICAL EXAM   Vitals:   12/18/16 0957  BP: (!) 152/75  Pulse: 93  Weight: (!) 350 lb (158.8 kg)  Height: 5\' 6"  (1.676 m)    Not recorded      Body mass index is 56.49 kg/m.  PHYSICAL EXAMNIATION:  Gen: NAD, conversant, well nourised, obese, well groomed                     Cardiovascular: Regular rate rhythm, no peripheral edema, warm, nontender. Eyes: Conjunctivae clear without exudates or hemorrhage Neck: Supple, no carotid bruits. Pulmonary: Clear to auscultation bilaterally   NEUROLOGICAL EXAM:  MENTAL STATUS:Obese depressed looking middle-age female  Speech:    Speech is normal; fluent and spontaneous with normal comprehension.  Cognition:     Orientation to time, place and person     Normal recent and remote memory     Normal Attention span and concentration     Normal Language, naming,  repeating,spontaneous speech     Fund of knowledge   CRANIAL NERVES: CN II: Visual fields are full to confrontation. Fundoscopic exam is normal with sharp discs and no vascular changes. Pupils are round equal and briskly reactive to light. CN III, IV, VI: extraocular movement are normal. No ptosis. CN V: Facial sensation is intact to pinprick in all 3 divisions bilaterally. Corneal responses are intact.  CN VII: Face is symmetric with normal eye closure and smile. CN VIII: Hearing is normal to rubbing fingers CN IX, X: Palate elevates symmetrically. Phonation is normal. CN XI: Head turning and shoulder shrug are intact CN XII: Tongue is midline with normal movements and no atrophy.  MOTOR: There is no pronator drift of out-stretched arms. Muscle bulk and tone are normal.Mild weakness at right hand grip,   REFLEXES: Reflexes are  1  and symmetric at the biceps, triceps, knees, and  trace at ankles. Plantar responses are flexor.  SENSORY:Length dependent decreased to  light touch, pinprick,  and vibratory sensation at toes,   COORDINATION: Rapid alternating movements and fine finger movements are intact. There is no dysmetria on finger-to-nose and heel-knee-shin.    GAIT/STANCE: She needs push up to get up from seated position, antalgic, also limited by her big body habitus.   DIAGNOSTIC DATA (LABS, IMAGING, TESTING) - I reviewed patient records, labs, notes, testing and imaging myself where available.   ASSESSMENT AND PLAN  Shemica Banet is a 46 y.o. female   Acute onset right sided paresthesia mild right-sided weakness on January twelfth 2018  Most suggestive of left thalamus/posterior limb of internal capsule small vessel stroke  She does have multiple vascular risk factors, hypertension, hyperlipidemia, obesity, diabetes, insulin-dependent, obstructive sleep apnea  I have suggested her take baby aspirin daily  Complete evaluation with MRI of the brain, MRA of the brain and  neck    Marcial Pacas, M.D. Ph.D.  [facility] [address], [place], [place] [address] Ph: [phone] Fax: [phone]  CC: Celedonio Savage, MD

## 2016-12-19 ENCOUNTER — Encounter: Payer: Self-pay | Admitting: *Deleted

## 2016-12-19 ENCOUNTER — Telehealth: Payer: Self-pay | Admitting: Neurology

## 2016-12-19 LAB — VITAMIN B12: Vitamin B-12: 508 pg/mL (ref 232–1245)

## 2016-12-19 LAB — LIPID PANEL
Chol/HDL Ratio: 5.1 ratio — ABNORMAL HIGH (ref 0.0–4.4)
Cholesterol, Total: 231 mg/dL — ABNORMAL HIGH (ref 100–199)
HDL: 45 mg/dL (ref >39)
LDL Calculated: 151 mg/dL — ABNORMAL HIGH (ref 0–99)
Triglycerides: 177 mg/dL — ABNORMAL HIGH (ref 0–149)
VLDL Cholesterol Cal: 35 mg/dL (ref 5–40)

## 2016-12-19 LAB — ANA W/REFLEX IF POSITIVE: Anti Nuclear Antibody(ANA): NEGATIVE

## 2016-12-19 LAB — C-REACTIVE PROTEIN: CRP: 18.2 mg/L — ABNORMAL HIGH (ref 0.0–4.9)

## 2016-12-19 LAB — VITAMIN D 25 HYDROXY (VIT D DEFICIENCY, FRACTURES): Vit D, 25-Hydroxy: 9.1 ng/mL — ABNORMAL LOW (ref 30.0–100.0)

## 2016-12-19 LAB — TSH: TSH: 2.32 u[IU]/mL (ref 0.450–4.500)

## 2016-12-19 LAB — SEDIMENTATION RATE: Sed Rate: 6 mm/hr (ref 0–32)

## 2016-12-19 LAB — HGB A1C W/O EAG: Hgb A1c MFr Bld: 10.6 % — ABNORMAL HIGH (ref 4.8–5.6)

## 2016-12-19 NOTE — Telephone Encounter (Signed)
Pt needed the chart updated, she is on Nystatin(an ointment from her gynecologist 3 x's a day when she has symptoms) & the terbinafine (LAMISIL) 250 MG tablet she is no longer on it.  Pt would like to know if her MRI can be done at [facility] since she lives closer to that [facility] location, please call

## 2016-12-19 NOTE — Telephone Encounter (Signed)
Medication list updated.  Message sent to [facility] to assist with changing location of MRI.

## 2016-12-19 NOTE — Telephone Encounter (Signed)
Right now the authorization is pending with her [place] insurance I need to see if it is approved first and then I will change the location to [facility] because right now the location on the case is [facility] imaging.Kathryn Jimenez

## 2016-12-19 NOTE — Telephone Encounter (Signed)
Faxed clinical notes

## 2016-12-20 ENCOUNTER — Telehealth: Payer: Self-pay | Admitting: Neurology

## 2016-12-20 DIAGNOSIS — F4310 Post-traumatic stress disorder, unspecified: Secondary | ICD-10-CM | POA: Diagnosis not present

## 2016-12-20 MED ORDER — SIMVASTATIN 10 MG PO TABS: 10.0000 mg | ORAL_TABLET | Freq: Every day | ORAL | 11 refills | Status: DC

## 2016-12-20 NOTE — Telephone Encounter (Signed)
Please call patient laboratory evaluation showed significant elevated A1c 10 point 6, indicating that her diabetes under suboptimal control, she should contact her primary care for better management of her diabetes, elevated cholesterol 231, LDL 151, I have called in Zocor 10 mg every night  Low vitamin D 9.1, she should take vitamin D3 supplement 1000  units, 2 tablets every day,  will repeat level in 3-6 months.

## 2016-12-20 NOTE — Telephone Encounter (Signed)
Called and spoke with patient. Kathryn Dan, RN spoke with her earlier about lab results.   She questioned if I knew if her imaging got approved so she can go to [facility] instead of [facility]. Earley Brooke E out of office but I will forward to those who are covering for her to find out more information. Patient stated she did get a call that it was approved.

## 2016-12-20 NOTE — Telephone Encounter (Signed)
Lab results have been faxed to PCP and endocrinology per patient request.  She is going to speak with her endocrinologist concerning her elevated cholesterol and getting back on medication.  She has tried several medications, including simvastatin, in the past. Simvastatin caused adverse side effects.

## 2016-12-20 NOTE — Addendum Note (Signed)
Addended by: Desmond Lope on: 12/20/2016 04:33 PM   Modules accepted: Orders

## 2016-12-24 NOTE — Telephone Encounter (Signed)
[employer] [place] approved now I am waiting on Medicaid it is pending the auth as soon as it has been approved by Medicaid then I can schedule her images at [facility].

## 2016-12-25 NOTE — Telephone Encounter (Signed)
Kathryn Jimenez is working on [employer] to get her at [facility].

## 2016-12-26 ENCOUNTER — Other Ambulatory Visit: Payer: Self-pay | Admitting: *Deleted

## 2016-12-26 DIAGNOSIS — E1110 Type 2 diabetes mellitus with ketoacidosis without coma: Secondary | ICD-10-CM

## 2016-12-26 NOTE — Telephone Encounter (Signed)
Patient called back she has telephone number to [facility] [phone] she will call them to schedule.

## 2016-12-26 NOTE — Telephone Encounter (Signed)
Called and left patient a message asking her to call me back about details of MRI. Thanks Hinton Dyer.

## 2016-12-26 NOTE — Telephone Encounter (Signed)
Both of her insurance BCBS & Medicaid I was got approved... I called the patient to schedule her exams at [facility] for 01/01/17 at 12:45 pt is aware of the time and day.. She also informed me that the nurse was suppose to call her about some medicine that she was going to take but she stated she didn't remember the name of the medicine.Marland Kitchen

## 2016-12-26 NOTE — Telephone Encounter (Signed)
Orders for labs placed in Epic 

## 2016-12-26 NOTE — Telephone Encounter (Signed)
[place] with [facility] [phone] called said the pt is having 3 MRI's,needs to have open MRI (weight),  she has changed orders to go to GI. The pt will be need to be called. Please touch base with her before calling the pt.

## 2016-12-26 NOTE — Telephone Encounter (Signed)
Patient is not able to have her exams at [facility] due to her wait.Marland Kitchen She will have to be sent to [facility] due to her weight.Marland Kitchen

## 2016-12-26 NOTE — Telephone Encounter (Signed)
She also needs a lab order in for BUN and Creatine.. She can have it at [facility] before she has her exams.

## 2016-12-27 DIAGNOSIS — F4310 Post-traumatic stress disorder, unspecified: Secondary | ICD-10-CM | POA: Diagnosis not present

## 2016-12-31 ENCOUNTER — Other Ambulatory Visit (HOSPITAL_COMMUNITY): Payer: BLUE CROSS/BLUE SHIELD

## 2017-01-01 ENCOUNTER — Other Ambulatory Visit (HOSPITAL_COMMUNITY): Payer: BLUE CROSS/BLUE SHIELD

## 2017-01-01 ENCOUNTER — Telehealth: Payer: Self-pay | Admitting: Neurology

## 2017-01-01 NOTE — Telephone Encounter (Signed)
Spoke to patient's husband on HIPAA - he is aware that her care here will not be impacted by this tragedy.  Once she has established a new PCP, we will be happy to forward her records to the new office.

## 2017-01-01 NOTE — Telephone Encounter (Signed)
Pt calling to make Dr Krista Blue aware that her PCP died in an accident this weekend and she is not sure of how this will impact the practice re: her MRI and information being relayed in addition to her medication.  Pt said she was told by the office if she needed anything just to go to the ED.  She is asking for a call back

## 2017-01-03 DIAGNOSIS — F4310 Post-traumatic stress disorder, unspecified: Secondary | ICD-10-CM | POA: Diagnosis not present

## 2017-01-08 ENCOUNTER — Telehealth: Payer: Self-pay | Admitting: Neurology

## 2017-01-08 NOTE — Telephone Encounter (Signed)
And I checked the location for Medicaid and it is correct for [facility].

## 2017-01-08 NOTE — Telephone Encounter (Signed)
Patient called in stated that the Image location was set as GI.. I called her [employer] insurance and switched the location to [facility]. She is scheduled to have her images done on 01/16/17 at [place].Marland Kitchen

## 2017-01-10 DIAGNOSIS — F6089 Other specific personality disorders: Secondary | ICD-10-CM | POA: Diagnosis not present

## 2017-01-16 DIAGNOSIS — E1165 Type 2 diabetes mellitus with hyperglycemia: Secondary | ICD-10-CM | POA: Diagnosis not present

## 2017-01-16 DIAGNOSIS — E7800 Pure hypercholesterolemia, unspecified: Secondary | ICD-10-CM | POA: Diagnosis not present

## 2017-01-16 DIAGNOSIS — E1111 Type 2 diabetes mellitus with ketoacidosis with coma: Secondary | ICD-10-CM | POA: Diagnosis not present

## 2017-01-16 DIAGNOSIS — E669 Obesity, unspecified: Secondary | ICD-10-CM | POA: Diagnosis not present

## 2017-01-16 DIAGNOSIS — I668 Occlusion and stenosis of other cerebral arteries: Secondary | ICD-10-CM | POA: Diagnosis not present

## 2017-01-16 DIAGNOSIS — I10 Essential (primary) hypertension: Secondary | ICD-10-CM | POA: Diagnosis not present

## 2017-01-16 DIAGNOSIS — I6523 Occlusion and stenosis of bilateral carotid arteries: Secondary | ICD-10-CM | POA: Diagnosis not present

## 2017-01-16 DIAGNOSIS — F4310 Post-traumatic stress disorder, unspecified: Secondary | ICD-10-CM | POA: Diagnosis not present

## 2017-01-16 DIAGNOSIS — I779 Disorder of arteries and arterioles, unspecified: Secondary | ICD-10-CM | POA: Diagnosis not present

## 2017-01-16 DIAGNOSIS — R202 Paresthesia of skin: Secondary | ICD-10-CM | POA: Diagnosis not present

## 2017-01-17 DIAGNOSIS — F4310 Post-traumatic stress disorder, unspecified: Secondary | ICD-10-CM | POA: Diagnosis not present

## 2017-01-21 ENCOUNTER — Ambulatory Visit: Payer: BLUE CROSS/BLUE SHIELD | Admitting: Neurology

## 2017-01-22 ENCOUNTER — Telehealth: Payer: Self-pay | Admitting: Neurology

## 2017-01-22 NOTE — Telephone Encounter (Signed)
Pt request imaging results done at [facility].

## 2017-01-23 DIAGNOSIS — N393 Stress incontinence (female) (male): Secondary | ICD-10-CM | POA: Diagnosis not present

## 2017-01-23 NOTE — Telephone Encounter (Signed)
Last week [facility] faxed the results I put them on your desk last week.

## 2017-01-23 NOTE — Telephone Encounter (Signed)
I messaged Shannon to fax the results again.Marland Kitchen

## 2017-01-24 ENCOUNTER — Encounter: Payer: Self-pay | Admitting: *Deleted

## 2017-01-24 ENCOUNTER — Other Ambulatory Visit: Payer: Self-pay | Admitting: Neurology

## 2017-01-24 DIAGNOSIS — F329 Major depressive disorder, single episode, unspecified: Secondary | ICD-10-CM | POA: Diagnosis not present

## 2017-01-24 DIAGNOSIS — G459 Transient cerebral ischemic attack, unspecified: Secondary | ICD-10-CM

## 2017-01-24 MED ORDER — CLOPIDOGREL BISULFATE 75 MG PO TABS: 75.0000 mg | ORAL_TABLET | Freq: Every day | ORAL | 11 refills | Status: DC

## 2017-01-24 NOTE — Addendum Note (Signed)
Addended by: Marcial Pacas on: 01/24/2017 08:31 AM   Modules accepted: Orders

## 2017-01-24 NOTE — Telephone Encounter (Signed)
Please call patient, MRI of the brain showed age advanced the small vessel disease, including the involvement of left thalamus, which could indicating a small vessel stroke, likely explaining her right side sensory symptoms.  Angiogram of the neck showed no significant abnormality Angiogram of the brain showed evidence of multivessel intra-cranial disease,  Please advise patient to bring MRI films for [place] to review at next visit on March 05 2017  I have called in Plavix 75 mg daily, she should stop aspirin 81 mg I also ordered echocardiogram   I was able to review MRI report from [place] at [facility] dated Jan 16 2017 but I do not have access to the films  MRI of brain nonspecific patchy T2/FLAIR hyperintensity within the central pons, fever age advanced chronic ischemic changes, sagittal T2 at the posterior lateral left thalamus, could represent a tiny old small vessel ischemic changes, that was negative for DWI, right M1 segment appears occluded, numerous small collateral vessels are present in the region of right M1 segment, distal right MCA branches are present, although they demonstrate diminished flow signal,  MRA of brain right M1 segment occluded, just off its origin, more distal right MCA branches are present, left MCA are unremarkable, stenosis or occlusion involving the paraclinoid segment of the distal right ICA, proximal segment of right MCA, bilateral ACA, the appearance wrist the possibility of moyamoya disease, age advanced intracranial atherosclerotic  other vasculopathy  MRA of neck no significant carotid or vertebral artery stenosis within the neck, changes mostly involving the intracranial vessels,

## 2017-01-24 NOTE — Telephone Encounter (Signed)
Patient is aware of results.  She will keep her pending appt for further discussion and bring her scan on disc for the review.  She will discontinue use of her aspirin 81mg  and start Plavix 75mg  daily.   She is agreeable to the echocardiogram but would like to have this completed at [facility] because it is closer to her home.

## 2017-01-30 NOTE — Telephone Encounter (Signed)
Called Patient Kathryn Jimenez x3  Waiting for return call.

## 2017-01-31 ENCOUNTER — Telehealth: Payer: Self-pay | Admitting: Neurology

## 2017-01-31 NOTE — Telephone Encounter (Signed)
Called and spoke to patient her echocardiogram. Her apt is 02/06/2018 at 11:15 am at [facility] . I spoke to patient with details.

## 2017-02-04 ENCOUNTER — Other Ambulatory Visit: Payer: Self-pay | Admitting: *Deleted

## 2017-02-04 DIAGNOSIS — E1110 Type 2 diabetes mellitus with ketoacidosis without coma: Secondary | ICD-10-CM

## 2017-02-04 DIAGNOSIS — Z794 Long term (current) use of insulin: Secondary | ICD-10-CM

## 2017-02-04 NOTE — Telephone Encounter (Signed)
Pt is requesting records to be sent to Dr [facility] (p) [phone]. Please call and let her know when this has done

## 2017-02-04 NOTE — Telephone Encounter (Signed)
Spoke to Kathryn Jimenez - she just needs her MRI results faxed again to Dr. Almetta Lovely office (attention: Zane Herald).  They were originally faxed on 01/22/17 but Dr. Almetta Lovely office states they did not receive them.

## 2017-02-05 ENCOUNTER — Telehealth: Payer: Self-pay | Admitting: *Deleted

## 2017-02-05 NOTE — Telephone Encounter (Signed)
MRA Angio Neck WO W IV Contrast5/30/2018 [facility] Result Impression  IMPRESSION: 1.No significant carotid or vertebral artery stenosis within the neck. 2.Changes involving the intracranial vessels concerning for moyamoya. Please see dedicated MRA head same date for additional detail.  Result Narrative  INDICATION: Type 2 diabetes mellitus with ketoacidosis with coma.  COMPARISON: Correlation with MRA head and MR brain same date  TECHNIQUE: MRA neck performed using 3-D time-of-flight and postcontrast techniques. Contrast - 20 mL GADOBENATE DIMEGLUMINE 529 MG/ML IV SOLN  FINDINGS: #Time-of-flight imaging: Time of flight imaging demonstrates antegrade flow within the carotid and vertebral arteries bilaterally.  #Aortic arch: No evidence of aneurysm. No significant stenosis or occlusion of the major arch vessels. #Left carotid system: No evidence of aneurysm, significant stenosis or occlusion. #Right carotid system: Cervical segments of the right carotid arteries are good caliber and patent throughout. There is narrowing of the paraclinoid segment of the distal right ICA. Please see dedicated MRA head same date for additional detail. #Vertebrobasilar system: The vertebral arteries arise from their respective subclavian arteries. No evidence of aneurysm, significant stenosis or occlusion. Dominant left vertebral artery. The nondominant right vertebral artery terminates primarily in  a PICA.     MRI Head WO IV Contrast5/30/2018 [facility] Result Impression  IMPRESSION: 1.Abnormal appearance of the proximal right MCA concerning for moyamoya. Please see dedicated MRA head same date for additional detail of the intracranial arteries. 2.Nonspecific T2/flair hyperintense changes within the central pons. Favor advanced chronic ischemic changes.Marland Kitchen 3.Nonspecific subtle focus of abnormal signal within the posterior left thalamus that could represent tiny old infarct/small  vessel ischemic change.Marland Kitchen 4.No acute hemorrhage or infarction.  Result Narrative  INDICATION: Type 2 diabetes mellitus with ketoacidosis with coma.  COMPARISON: Correlation with MRA neck and head same date.  TECHNIQUE: Multiplanar, multisequence MR imaging of the brain was obtained without IV contrast.  FINDINGS: #Skull/marrow/soft tissues: Unremarkable. #Orbits: Unremarkable. #Sinuses: Small retention cyst inferior left maxillary sinus. No air-fluid levels. #Brain: Overall parenchymal volume within normal limits for age. Nonspecific patchy T2/FLAIR hyperintensity within the central pons. Favor age advanced chronic ischemic changes. Subtle focus of T2 prolongation within the posterior lateral left thalamus  could represent tiny old infarct/small vessel ischemic change. There is no restricted diffusion to suggest an acute or recent infarct. No mass or hemorrhage. No midline shift, hydrocephalus or extra-axial collections. The right M1 segment appears  occluded. Numerous small collateral vessels are present in the region of the right M1 segment. Distal right MCA branches are present, although, they demonstrate diminished flow signal. Please see dedicated MRA head for additional detail of the  intracranial arteries. The CP angle cisterns, internal auditory canals and inner ears structures appear grossly normal on this unenhanced study.     MRA Angio Head WO IV Contrast5/30/2018 [facility] Result Impression  IMPRESSION: 1.Stenosis/occlusion involving the paraclinoid segment of the distal right ICA and proximal segments of the right MCA and bilateral ACAs as detailed above. Appearance certainly raises the possibility of moyamoya.. Age advanced intracranial  atherosclerosis or perhaps other vasculopathy not excluded..  Result Narrative  INDICATION: Type 2 diabetes mellitus with ketoacidosis with coma.  COMPARISON: Correlation with MR brain same date.  TECHNIQUE: MRA head  performed using 3-D time-of-flight technique without contrast. MIP images obtained.Marland Kitchen  FINDINGS:  #Distal internal carotid arteries: Some narrowing of the paraclinoid segment of the distal right ICA. Left ICA unremarkable. #Anterior cerebral arteries: The bilateral A1 segments are very small in caliber. The right A1 segment appears to be  patent. The distal segment of the left A1 segment may be occluded. Marked attenuation of the right A2 segment. More distal branches of  the right ACA appear good caliber. Distal left ACA branches not well seen... #Middle cerebral arteries: The right M1 segment appears occluded just after its origin. More distal right MCA branches are present. Left MCA unremarkable. #Posterior cerebral arteries: Mild stenosis at the junction of the right P2 and P3 segments. Posterior cerebral arteries otherwise unremarkable. #Basilar artery: No aneurysm, significant stenosis or occlusion. #Distal vertebral arteries: Vertebral arteries not imaged on this study.

## 2017-02-05 NOTE — Telephone Encounter (Signed)
Called Dr. Almetta Lovely office [phone]) to get fax number.  Records have been faxed to [place]'s attention at [phone].

## 2017-02-05 NOTE — Telephone Encounter (Signed)
Results have been faxed to her care team (PCP and Endocrinologist).

## 2017-02-06 ENCOUNTER — Other Ambulatory Visit (HOSPITAL_COMMUNITY): Payer: BLUE CROSS/BLUE SHIELD

## 2017-02-15 ENCOUNTER — Ambulatory Visit (HOSPITAL_COMMUNITY): Payer: BLUE CROSS/BLUE SHIELD

## 2017-02-21 ENCOUNTER — Ambulatory Visit (HOSPITAL_COMMUNITY)
Admission: RE | Admit: 2017-02-21 | Discharge: 2017-02-21 | Disposition: A | Discharge status: Home / Self Care | Payer: Medicaid Other | Source: Ambulatory Visit | Attending: Neurology | Admitting: Neurology

## 2017-02-21 DIAGNOSIS — I071 Rheumatic tricuspid insufficiency: Secondary | ICD-10-CM | POA: Insufficient documentation

## 2017-02-21 DIAGNOSIS — G459 Transient cerebral ischemic attack, unspecified: Secondary | ICD-10-CM | POA: Diagnosis not present

## 2017-02-21 NOTE — Progress Notes (Signed)
*  PRELIMINARY RESULTS* Echocardiogram 2D Echocardiogram has been performed.  Kathryn Jimenez 02/21/2017, 10:40 AM

## 2017-02-28 DIAGNOSIS — F6089 Other specific personality disorders: Secondary | ICD-10-CM | POA: Diagnosis not present

## 2017-03-05 ENCOUNTER — Encounter: Payer: Self-pay | Admitting: Neurology

## 2017-03-05 ENCOUNTER — Ambulatory Visit (INDEPENDENT_AMBULATORY_CARE_PROVIDER_SITE_OTHER): Payer: BLUE CROSS/BLUE SHIELD | Admitting: Neurology

## 2017-03-05 DIAGNOSIS — I999 Unspecified disorder of circulatory system: Secondary | ICD-10-CM

## 2017-03-05 DIAGNOSIS — I779 Disorder of arteries and arterioles, unspecified: Secondary | ICD-10-CM | POA: Insufficient documentation

## 2017-03-05 DIAGNOSIS — Z794 Long term (current) use of insulin: Secondary | ICD-10-CM | POA: Diagnosis not present

## 2017-03-05 DIAGNOSIS — R202 Paresthesia of skin: Secondary | ICD-10-CM

## 2017-03-05 DIAGNOSIS — E1110 Type 2 diabetes mellitus with ketoacidosis without coma: Secondary | ICD-10-CM

## 2017-03-05 DIAGNOSIS — I739 Peripheral vascular disease, unspecified: Secondary | ICD-10-CM | POA: Insufficient documentation

## 2017-03-05 HISTORY — DX: Disorder of arteries and arterioles, unspecified: I77.9

## 2017-03-05 MED ORDER — GEMFIBROZIL 600 MG PO TABS
600.0000 mg | ORAL_TABLET | Freq: Two times a day (BID) | ORAL | 11 refills | Status: DC
Start: 2017-03-05 — End: 2017-07-01

## 2017-03-05 NOTE — Progress Notes (Signed)
PATIENT: Kathryn Jimenez DOB: [birth year]  Chief Complaint  Patient presents with  . Follow-up    pt is still having numbness on her right leg, pt is here to follow up on her tests. pt is here with her husband. Pt's PCP was Dr. Wenda Overland, but pt is looking for a new PCP.     HISTORICAL  Kathryn Jimenez is 46 years old right-handed female, seen in refer by her primary care physician Celedonio Savage for evaluation of facial numbness, right arm and leg weakness, initial evaluation was on May 1 Jimenez.  I reviewed and summarized most recent office visit, she had past medical history of obesity, type 2 diabetes since 1999, insulin-dependent 2007, diabetic peripheral neuropathy, bilateral carpal tunnel syndromes, last A1c was 7.8, chronic neck, low back pain, depression anxiety, PTSD, on disability because of her mental health, she was followed up by psychiatrist, obstructive sleep apnea, using CPAP machine, asthma, irritable bowel syndrome, she also reported a history of right cervical decompression surgery in 2003, she presented with severe right cervical radiculopathy surgery has been very helpful,  She had baseline bilateral lower extremity paresthesia below ankle, intermittent bilateral hands paresthesia due to her carpal tunnel syndromes, but on January 12th Jimenez she had a set onset numbness involving her right arm, right face, right flank, right lower extremity, which has been persistent since its onset, she also noticed mild weakness, difficulty to make a tight grip with her right hand,  She was evaluated by [facility] the day of symptoms onset on January twelfth Jimenez, she presented to the emergency room again on February 2 Jimenez with her persistent right sided paresthesia, she complains of gait abnormality because of her obesity, previous bilateral ankle injury, especially right ankle  I was able to review the record on Kathryn Jimenez, CT head showed no significant abnormality, MRI of the brain was  attempted but she could not feed in the machine because of her weight,   vital signs upon presentation was 136/70  Laboratory evaluations, normal CMP creatinine of 0.53, with exception of elevated glucose 218, normal CBC hemoglobin of 12 point 6,  UPDATE July 17 Jimenez:  We have reviewed  laboratory evaluation in May Jimenez,  showed significant elevateA1c 10.8. elevated cholesterol 231, LDL 151,   Patient stated she has tried different statin in the past, could not tolerate it due to deep muscle achy pain,   Low vitamin D 9.1, is taking vitamin D3 supplement 1000  units, 2 tablets every day,      I personally reviewed MRI of brain MRI report from [place] [facility] dated May 30 Jimenez  MRI of brain nonspecific patchy T2/FLAIR hyperintensity within the central pons, few age advanced chronic ischemic changes, sagittal T2 at the posterior lateral left thalamus, could represent a tiny old small vessel ischemic changes, that was negative for DWI, right M1 segment appears occluded, numerous small collateral vessels are present in the region of right M1 segment, distal right MCA branches are present, although they demonstrate diminished flow signal,  MRA of brain right M1 segment occluded, just off its origin, more distal right MCA branches are present, left MCA are unremarkable, stenosis or occlusion involving the paraclinoid segment of the distal right ICA, proximal segment of right MCA, bilateral ACA, the appearance wrist the possibility of moyamoya disease, age advanced intracranial atherosclerotic  other vasculopathy  MRA of neck no significant carotid or vertebral artery stenosis within the neck, changes mostly involving the  intracranial vessels,  echocardiogram in July Jimenez: Ejection fraction 60-65%, no structural lesion identified  She is now taking Plavix daily, there was no recurrent focal symptoms,   REVIEW OF SYSTEMS: Full 14 system review of systems performed and notable  only for weight loss, fatigue, paresthesia,   ALLERGIES: Allergies  Allergen Reactions  . Augmentin [Amoxicillin-Pot Clavulanate] Nausea Only    Causes severe diarrhea  . Morphine And Related Itching and Rash    HOME MEDICATIONS: Current Outpatient Prescriptions  Medication Sig Dispense Refill  . acetaminophen (TYLENOL) 500 MG tablet Take 500 mg by mouth every 6 (six) hours as needed.    Marland Kitchen albuterol (PROVENTIL HFA;VENTOLIN HFA) 108 (90 BASE) MCG/ACT inhaler Inhale 2 puffs into the lungs 4 (four) times daily as needed for wheezing or shortness of breath.    Marland Kitchen b complex vitamins capsule Take 1 capsule by mouth daily.    Marland Kitchen buPROPion (WELLBUTRIN XL) 150 MG 24 hr tablet Take 150 mg by mouth daily.    . clopidogrel (PLAVIX) 75 MG tablet Take 1 tablet (75 mg total) by mouth daily. 30 tablet 11  . clotrimazole (LOTRIMIN) 1 % cream Apply 1 application topically as needed.     . docusate sodium (COLACE) 100 MG capsule Take 2 capsules (200 mg total) by mouth at bedtime. (Patient taking differently: Take 200 mg by mouth as needed. )  0  . doxepin (SINEQUAN) 10 MG capsule Take 10 mg by mouth 3 (three) times daily.     Marland Kitchen FLUoxetine (PROZAC) 20 MG capsule Take 20 mg by mouth daily.    . furosemide (LASIX) 40 MG tablet Take 40 mg by mouth daily.    . insulin aspart (NOVOLOG) 100 UNIT/ML injection Inject 50 Units into the skin 3 (three) times daily before meals. ro in am , 50 and lunc and 50 pm    . insulin NPH Human (HUMULIN N,NOVOLIN N) 100 UNIT/ML injection Inject into the skin 3 (three) times daily. 75 units in the morning, 75 units at lunch, 60 units at bedtime.    Marland Kitchen lisinopril (PRINIVIL,ZESTRIL) 20 MG tablet Take 20 mg by mouth daily.    Marland Kitchen LORazepam (ATIVAN) 0.5 MG tablet Take 0.5 mg by mouth. Takes 0.25mg  - 0.50mg , 1-2 time daily as needed.    . medroxyPROGESTERone (PROVERA) 10 MG tablet Take 10 mg by mouth See admin instructions. Reported on 12/26/2015    . nystatin ointment (MYCOSTATIN) Apply 1  application topically 3 (three) times daily as needed.    . potassium chloride (K-DUR) 10 MEQ tablet Take 20 mEq by mouth daily.    . pregabalin (LYRICA) 225 MG capsule Take 225 mg by mouth 3 (three) times daily.    . valACYclovir (VALTREX) 500 MG tablet Take 500 mg by mouth daily.    . vitamin B-12 (CYANOCOBALAMIN) 1000 MCG tablet Take 1,000 mcg by mouth daily.     No current facility-administered medications for this visit.     PAST MEDICAL HISTORY: Past Medical History:  Diagnosis Date  . Anginal pain ([place])   . Anxiety   . Arthritis   . Asthma   . Bladder disorder, other   . Borderline personality disorder   . Depression   . Diabetes mellitus without complication ([place])   . GERD (gastroesophageal reflux disease)   . Headache   . Hypertension   . Kidney stones   . Neuromuscular disorder ([facility])    Neuropathy  . PTSD (post-traumatic stress disorder)   . Shortness of breath   .  Sleep apnea     PAST SURGICAL HISTORY: Past Surgical History:  Procedure Laterality Date  . CARPAL TUNNEL RELEASE     left 2015  . CERVICAL FUSION Bilateral 2003   C 5-6 with titanium screws  . CHOLECYSTECTOMY    . COLONOSCOPY WITH PROPOFOL N/A 06/24/2014   Procedure: COLONOSCOPY WITH PROPOFOL;  Surgeon: Rogene Houston, MD;  Location: [facility];  Service: Endoscopy;  Laterality: N/A;  cecum time in 0810    time out   0821    total time 11 minutes  . POLYPECTOMY N/A 06/24/2014   Procedure: RECTAL POLYPECTOMY;  Surgeon: Rogene Houston, MD;  Location: [facility];  Service: Endoscopy;  Laterality: N/A;  . [place]  . WISDOM TOOTH EXTRACTION Bilateral 2001 and 1990s   top and bottom     FAMILY HISTORY: Family History  Problem Relation Age of Onset  . COPD Mother   . Lung cancer Father   . Colon cancer Sister   . Breast cancer Sister     SOCIAL HISTORY:  Social History   Social History  . Marital status: Married    Spouse name: N/A  . Number of children: 0  . Years of education:  HS   Occupational History  . Disabled    Social History Main Topics  . Smoking status: Never Smoker  . Smokeless tobacco: Never Used  . Alcohol use Yes     Comment: Rarely  . Drug use: No  . Sexual activity: Yes    Birth control/ protection: Pill   Other Topics Concern  . Not on file   Social History Narrative   Lives at home with husband.   Right-handed.   3-4 cups caffeine per day.        PHYSICAL EXAM   There were no vitals filed for this visit.  Not recorded      There is no height or weight on file to calculate BMI.  PHYSICAL EXAMNIATION:  Gen: NAD, conversant, well nourised, obese, well groomed                     Cardiovascular: Regular rate rhythm, no peripheral edema, warm, nontender. Eyes: Conjunctivae clear without exudates or hemorrhage Neck: Supple, no carotid bruits. Pulmonary: Clear to auscultation bilaterally   NEUROLOGICAL EXAM:  MENTAL STATUS:Obese depressed looking middle-age female  Speech:    Speech is normal; fluent and spontaneous with normal comprehension.  Cognition:     Orientation to time, place and person     Normal recent and remote memory     Normal Attention span and concentration     Normal Language, naming, repeating,spontaneous speech     Fund of knowledge   CRANIAL NERVES: CN II: Visual fields are full to confrontation. Fundoscopic exam is normal with sharp discs and no vascular changes. Pupils are round equal and briskly reactive to light. CN III, IV, VI: extraocular movement are normal. No ptosis. CN V: Facial sensation is intact to pinprick in all 3 divisions bilaterally. Corneal responses are intact.  CN VII: Face is symmetric with normal eye closure and smile. CN VIII: Hearing is normal to rubbing fingers CN IX, X: Palate elevates symmetrically. Phonation is normal. CN XI: Head turning and shoulder shrug are intact CN XII: Tongue is midline with normal movements and no atrophy.  MOTOR: There is no pronator drift  of out-stretched arms. Muscle bulk and tone are normal.Mild weakness at right hand grip,   REFLEXES: Reflexes  are  1  and symmetric at the biceps, triceps, knees, and  trace at ankles. Plantar responses are flexor.  SENSORY:Length dependent decreased to  light touch, pinprick,  and vibratory sensation at toes,   COORDINATION: Rapid alternating movements and fine finger movements are intact. There is no dysmetria on finger-to-nose and heel-knee-shin.    GAIT/STANCE: She needs push up to get up from seated position, antalgic, also limited by her big body habitus.   DIAGNOSTIC DATA (LABS, IMAGING, TESTING) - I reviewed patient records, labs, notes, testing and imaging myself where available.   ASSESSMENT AND PLAN  Kathryn Jimenez is a 46 y.o. female   Abnormal MRI, MR a of brain    right M1 segment occluded, just off its origin, more distal right MCA branches are present, left MCA are unremarkable, stenosis or occlusion involving the paraclinoid segment of the distal right ICA, proximal segment of right MCA, bilateral ACA, the appearance raise the possibility of moyamoya disease, age advanced intracranial atherosclerotic  other vasculopathy.  She has multiple vascular risk factors, obesity, sedentary lifestyle, hyperlipidemia, not able to tolerate extended treatment in the past, poorly controlled diabetes, hypertension,   keep Plavix 75 mg daily   Repeat CT angiogram of head and neck in 6 months  Add on Lopid   Marcial Pacas, M.D. Ph.D.  [facility] [address], [place], [place] [address] Ph: [phone] Fax: [phone]  CC: Celedonio Savage, MD

## 2017-03-07 ENCOUNTER — Telehealth: Payer: Self-pay | Admitting: Neurology

## 2017-03-07 DIAGNOSIS — F6089 Other specific personality disorders: Secondary | ICD-10-CM | POA: Diagnosis not present

## 2017-03-07 NOTE — Telephone Encounter (Signed)
Do you want me to call [employer] and withdraw the exams?

## 2017-03-07 NOTE — Telephone Encounter (Signed)
I was unaware of you wanting the CTA head done in December. If both of the CTA's get approved by your peer to peer it will only be good for 1 month. Do you want me to withdraw both of the CTA's and then redo it once it get closer to December?

## 2017-03-07 NOTE — Telephone Encounter (Signed)
I called [employer] to withdraw the exams they informed me they can't withdraw because it has been denied. But they said that will not effect me when I restart the case in December.

## 2017-03-07 NOTE — Telephone Encounter (Signed)
[place] did not approve either of the CT's. I faxed the clinical notes on 03/05/17 and I called today to check the status and they informed me it has been denied. But there is still an option to do a peer to peer. The phone number for the peer to peer is [phone] option 3. The tracking number is [id number], her member ID # is [id number] & her DOB is 1971/06/18. Her other insurance medicaid did approve both of these CT's.

## 2017-03-07 NOTE — Telephone Encounter (Signed)
I will do peer to peer review, But I want CTA of head in Dec 2018. 6 months from previous MRA, suggestions?

## 2017-03-14 DIAGNOSIS — F6089 Other specific personality disorders: Secondary | ICD-10-CM | POA: Diagnosis not present

## 2017-03-21 DIAGNOSIS — F6089 Other specific personality disorders: Secondary | ICD-10-CM | POA: Diagnosis not present

## 2017-03-28 DIAGNOSIS — F4310 Post-traumatic stress disorder, unspecified: Secondary | ICD-10-CM | POA: Diagnosis not present

## 2017-04-08 DIAGNOSIS — N393 Stress incontinence (female) (male): Secondary | ICD-10-CM | POA: Diagnosis not present

## 2017-04-09 ENCOUNTER — Ambulatory Visit: Payer: BLUE CROSS/BLUE SHIELD | Admitting: Family Medicine

## 2017-04-18 DIAGNOSIS — F4310 Post-traumatic stress disorder, unspecified: Secondary | ICD-10-CM | POA: Diagnosis not present

## 2017-04-19 ENCOUNTER — Telehealth: Payer: Self-pay | Admitting: Neurology

## 2017-04-19 NOTE — Telephone Encounter (Signed)
Pt calling to inform that she is having a side effect to the gemfibrozil (LOPID) 600 MG tablet.  Pt states the side effect is that is makes her tongue tingle, somewhat burns.  Pt asking to be called

## 2017-04-19 NOTE — Telephone Encounter (Signed)
I have spoken with Kathryn Jimenez, and per YY, advised she stop Lopid and drink plenty of water.  She verbalized understanding of same/fim

## 2017-04-19 NOTE — Telephone Encounter (Signed)
Please advise patient stop Lopid now, increase water intake.

## 2017-04-29 ENCOUNTER — Telehealth: Payer: Self-pay | Admitting: Neurology

## 2017-04-29 NOTE — Telephone Encounter (Signed)
Pt asking for a call back with the name of the diagnosis she was given by Dr Krista Blue.  This info is needed so that she can provide it to her new family doctor she will see on tomorrow

## 2017-04-29 NOTE — Telephone Encounter (Signed)
She has changed her PCP to Dr. Kenn File.  Her chart has been updated.

## 2017-04-30 ENCOUNTER — Encounter: Payer: Self-pay | Admitting: Family Medicine

## 2017-04-30 ENCOUNTER — Ambulatory Visit (INDEPENDENT_AMBULATORY_CARE_PROVIDER_SITE_OTHER): Payer: BLUE CROSS/BLUE SHIELD | Admitting: Family Medicine

## 2017-04-30 VITALS — BP 122/69 | HR 80 | Temp 99.1°F | Ht 66.0 in | Wt 355.2 lb

## 2017-04-30 DIAGNOSIS — E1140 Type 2 diabetes mellitus with diabetic neuropathy, unspecified: Secondary | ICD-10-CM

## 2017-04-30 DIAGNOSIS — F32A Depression, unspecified: Secondary | ICD-10-CM

## 2017-04-30 DIAGNOSIS — F329 Major depressive disorder, single episode, unspecified: Secondary | ICD-10-CM | POA: Diagnosis not present

## 2017-04-30 DIAGNOSIS — Z794 Long term (current) use of insulin: Secondary | ICD-10-CM | POA: Diagnosis not present

## 2017-04-30 DIAGNOSIS — I10 Essential (primary) hypertension: Secondary | ICD-10-CM | POA: Diagnosis not present

## 2017-04-30 NOTE — Patient Instructions (Signed)
Great to meet you!  Come back in 2 months for general follow up

## 2017-04-30 NOTE — Progress Notes (Signed)
   HPI  Patient presents today here to establish care, her previous PCP has passed away.  Hypertension Good medication compliance. Patient with trouble with severe leg edema previously, taking Lasix daily.  Patient has diabetic neuropathy, she uses Lyrica 225 mg once or twice daily for this., She has previously taken 3 times a day and has reduced her dose. On many days she takes one pill once a day.  Patient uses doxepin for OCD expressed as picking. She explains that she has some body dysmorphic issues and begins to pick incessantly of areas that she is unhappy with. Doxepin helps well, her mental health provider is aware she is on it.  Type 2 diabetes. Previous A1c as she states was a 15 She sees [facility], Dr. Chalmers Cater States that she does her labs routinely and declines labs here.  Hypertension Good medication compliance, does not need refills  PMH: Complex, see EMR for complete listing, asthma, diabetes, GERD, kidney stones, PTSD, stroke Surgical history: Spine surgery, rectal polypectomy, cervical fusion, carpal tunnel release on the left Family medical history: Colon  ROS: and breast cancer in sister, lung cancer in father, COPD mother sister, alcohol abuse PGM-PGM social history: No tobacco or alcohol use   Per HPI  Objective: BP 122/69   Pulse 80   Temp 99.1 F (37.3 C) (Oral)   Ht 5\' 6"  (1.676 m)   Wt (!) 355 lb 3.2 oz (161.1 kg)   LMP 04/05/2017   BMI 57.33 kg/m  Gen: NAD, alert, cooperative with exam HEENT: NCAT CV: RRR, good S1/S2, no murmur Resp: CTABL, no wheezes, non-labored Ext: 1+ pitting edema bilateral lower extremities Neuro: Alert and oriented, No gross deficits  Depression screen PHQ 2/9 04/30/2017  Decreased Interest 3  Down, Depressed, Hopeless 3  PHQ - 2 Score 6  Altered sleeping 3  Tired, decreased energy 3  Change in appetite 3  Feeling bad or failure about yourself  2  Trouble concentrating 2  Moving slowly or  fidgety/restless 3  Suicidal thoughts 1  PHQ-9 Score 23  Difficult doing work/chores Somewhat difficult     Assessment and plan:  # Hypertension Well-controlled on lisinopril, Lasix No change Offer labs, she declines, endocrinology will draw these Records requested  # Type 2 diabetes Uncontrolled, patient follows closely with endocrinology Very insulin-dependent  # Depression Severe, patient denies active SI, denies any intent to hurt herself She contracts for safety Closely with psychiatry, [facility]   Follow up 2 months   Laroy Apple, MD [facility] 04/30/2017, 5:25 PM

## 2017-05-02 DIAGNOSIS — F6089 Other specific personality disorders: Secondary | ICD-10-CM | POA: Diagnosis not present

## 2017-05-09 DIAGNOSIS — F4310 Post-traumatic stress disorder, unspecified: Secondary | ICD-10-CM | POA: Diagnosis not present

## 2017-05-16 ENCOUNTER — Telehealth: Payer: Self-pay | Admitting: Neurology

## 2017-05-16 DIAGNOSIS — F6089 Other specific personality disorders: Secondary | ICD-10-CM | POA: Diagnosis not present

## 2017-05-16 NOTE — Telephone Encounter (Signed)
Patient called office in reference to having repeat CTA head and CTA neck in December per Dr. Krista Blue.  Patient would like to see if she is able to move along and get these scheduled for December this soon so her husband can get it on his work schedule.  Please call

## 2017-05-16 NOTE — Telephone Encounter (Signed)
Patient stated she was interested in going to [place] for her CT's. I told her I would reach out to [facility] and they will contact her to schedule her CT's for December.

## 2017-05-23 DIAGNOSIS — F6089 Other specific personality disorders: Secondary | ICD-10-CM | POA: Diagnosis not present

## 2017-05-28 DIAGNOSIS — F6089 Other specific personality disorders: Secondary | ICD-10-CM | POA: Diagnosis not present

## 2017-06-03 DIAGNOSIS — N939 Abnormal uterine and vaginal bleeding, unspecified: Secondary | ICD-10-CM | POA: Diagnosis not present

## 2017-06-06 DIAGNOSIS — H01006 Unspecified blepharitis left eye, unspecified eyelid: Secondary | ICD-10-CM | POA: Diagnosis not present

## 2017-06-17 ENCOUNTER — Other Ambulatory Visit: Payer: Self-pay

## 2017-06-17 MED ORDER — LISINOPRIL 20 MG PO TABS: 20.0000 mg | ORAL_TABLET | Freq: Every day | ORAL | 0 refills | Status: DC

## 2017-06-17 NOTE — Progress Notes (Signed)
Ok with A1C in the interim. Will send lisinopril refill.   Laroy Apple, MD [facility] 06/17/2017, 1:24 PM

## 2017-06-17 NOTE — Progress Notes (Signed)
Patient needing refill for lisinopril- Rx sent in. Patient states that she can not get into Dr. Chalmers Cater until Jan and is wanting to know if we can do her A1c at her next apt with [place] 07/01/17. Please advise

## 2017-06-17 NOTE — Progress Notes (Signed)
Husband aware per dpr.  

## 2017-06-20 DIAGNOSIS — F6089 Other specific personality disorders: Secondary | ICD-10-CM | POA: Diagnosis not present

## 2017-07-01 ENCOUNTER — Encounter: Payer: Self-pay | Admitting: Family Medicine

## 2017-07-01 ENCOUNTER — Ambulatory Visit (INDEPENDENT_AMBULATORY_CARE_PROVIDER_SITE_OTHER): Payer: BLUE CROSS/BLUE SHIELD | Admitting: Family Medicine

## 2017-07-01 VITALS — BP 146/82 | HR 76 | Temp 97.4°F | Ht 66.0 in | Wt 356.6 lb

## 2017-07-01 DIAGNOSIS — F329 Major depressive disorder, single episode, unspecified: Secondary | ICD-10-CM | POA: Diagnosis not present

## 2017-07-01 DIAGNOSIS — E1142 Type 2 diabetes mellitus with diabetic polyneuropathy: Secondary | ICD-10-CM | POA: Diagnosis not present

## 2017-07-01 DIAGNOSIS — E559 Vitamin D deficiency, unspecified: Secondary | ICD-10-CM | POA: Diagnosis not present

## 2017-07-01 DIAGNOSIS — Z794 Long term (current) use of insulin: Secondary | ICD-10-CM

## 2017-07-01 DIAGNOSIS — Z23 Encounter for immunization: Secondary | ICD-10-CM

## 2017-07-01 DIAGNOSIS — F32A Depression, unspecified: Secondary | ICD-10-CM

## 2017-07-01 LAB — BAYER DCA HB A1C WAIVED: HB A1C (BAYER DCA - WAIVED): 9 % — ABNORMAL HIGH (ref <7.0)

## 2017-07-01 MED ORDER — PREGABALIN 225 MG PO CAPS: 225.0000 mg | ORAL_CAPSULE | Freq: Two times a day (BID) | ORAL | 2 refills | Status: DC

## 2017-07-01 NOTE — Progress Notes (Signed)
   HPI  Patient presents today here to follow-up for chronic medical conditions.  Patient has history of neuropathic pain treated with Lyrica.  She states that 3600 mg of gabapentin was inadequate.  She was changed over Lyrica and did pretty well.  At a time she was taking it 3 times daily, she is now down to twice daily and once daily on Sundays.  Hypertension Does not check at home. No headaches or chest pain. Good medication compliance.  2 diabetes Severely uncontrolled diabetes with high insulin need.  Patient does see endocrinology but is having a difficult time getting in due to scheduling conflicts and will not be seen again for 2 months at her endocrinologist.  She would like to have her A1c checked.  Vitamin D deficiency-inconsistent vitamin D use.  nonfasting today  PMH: Smoking status noted ROS: Per HPI  Objective: BP (!) 146/82   Pulse 76   Temp (!) 97.4 F (36.3 C) (Oral)   Ht '5\' 6"'$  (1.676 m)   Wt (!) 356 lb 9.6 oz (161.8 kg)   BMI 57.56 kg/m  Gen: NAD, alert, cooperative with exam, obese HEENT: NCAT, EOMI, PERRL CV: RRR, good S1/S2, no murmur Resp: CTABL, no wheezes, non-labored Ext: No edema, warm Neuro: Alert and oriented, No gross deficits  Assessment and plan:  #Type 2 diabetes Managed by endocrinology A1c today. Reviewed insulin dosing.  #Diabetic neuropathy Refill Lyrica, reviewed [place] controlled substance database. Medication works well, at times she can manage with only 1 pill once daily  #vitamin D deficiency Repeat labs Reviewed dosing  #Depression Stable, contracts for safety, denies SI. Follows closely with psychiatry    Orders Placed This Encounter  Procedures  . Bayer DCA Hb A1c Waived  . CMP14+EGFR  . CBC with Differential/Platelet  . Lipid panel  . VITAMIN D 25 Hydroxy (Vit-D Deficiency, Fractures)    Meds ordered this encounter  Medications  . pregabalin (LYRICA) 225 MG capsule    Sig: Take 1 capsule (225  mg total) 2 (two) times daily by mouth.    Dispense:  60 capsule    Refill:  Bell, MD [place] 07/01/2017, 10:11 AM

## 2017-07-01 NOTE — Patient Instructions (Addendum)
Great to see you!  Come back in 3 months unless you need us sooner.    

## 2017-07-02 LAB — CBC WITH DIFFERENTIAL/PLATELET
Basophils Absolute: 0 10*3/uL (ref 0.0–0.2)
Basos: 0 %
EOS (ABSOLUTE): 0.1 10*3/uL (ref 0.0–0.4)
Eos: 1 %
Hematocrit: 39.9 % (ref 34.0–46.6)
Hemoglobin: 13.3 g/dL (ref 11.1–15.9)
Immature Grans (Abs): 0 10*3/uL (ref 0.0–0.1)
Immature Granulocytes: 0 %
Lymphocytes Absolute: 4.5 10*3/uL — ABNORMAL HIGH (ref 0.7–3.1)
Lymphs: 45 %
MCH: 29.6 pg (ref 26.6–33.0)
MCHC: 33.3 g/dL (ref 31.5–35.7)
MCV: 89 fL (ref 79–97)
Monocytes Absolute: 0.6 10*3/uL (ref 0.1–0.9)
Monocytes: 6 %
Neutrophils Absolute: 4.8 10*3/uL (ref 1.4–7.0)
Neutrophils: 48 %
Platelets: 421 10*3/uL — ABNORMAL HIGH (ref 150–379)
RBC: 4.5 x10E6/uL (ref 3.77–5.28)
RDW: 14.3 % (ref 12.3–15.4)
WBC: 10 10*3/uL (ref 3.4–10.8)

## 2017-07-02 LAB — LIPID PANEL
Chol/HDL Ratio: 5.2 ratio — ABNORMAL HIGH (ref 0.0–4.4)
Cholesterol, Total: 230 mg/dL — ABNORMAL HIGH (ref 100–199)
HDL: 44 mg/dL (ref >39)
LDL Calculated: 119 mg/dL — ABNORMAL HIGH (ref 0–99)
Triglycerides: 336 mg/dL — ABNORMAL HIGH (ref 0–149)
VLDL Cholesterol Cal: 67 mg/dL — ABNORMAL HIGH (ref 5–40)

## 2017-07-02 LAB — CMP14+EGFR
ALT: 18 IU/L (ref 0–32)
AST: 21 IU/L (ref 0–40)
Albumin/Globulin Ratio: 1.5 (ref 1.2–2.2)
Albumin: 4.1 g/dL (ref 3.5–5.5)
Alkaline Phosphatase: 108 IU/L (ref 39–117)
BUN/Creatinine Ratio: 12 (ref 9–23)
BUN: 8 mg/dL (ref 6–24)
Bilirubin Total: <0.2 mg/dL (ref 0.0–1.2)
CO2: 26 mmol/L (ref 20–29)
Calcium: 9.5 mg/dL (ref 8.7–10.2)
Chloride: 94 mmol/L — ABNORMAL LOW (ref 96–106)
Creatinine, Ser: 0.65 mg/dL (ref 0.57–1.00)
GFR calc Af Amer: 125 mL/min/{1.73_m2} (ref >59)
GFR calc non Af Amer: 108 mL/min/{1.73_m2} (ref >59)
Globulin, Total: 2.8 g/dL (ref 1.5–4.5)
Glucose: 202 mg/dL — ABNORMAL HIGH (ref 65–99)
Potassium: 4.2 mmol/L (ref 3.5–5.2)
Sodium: 137 mmol/L (ref 134–144)
Total Protein: 6.9 g/dL (ref 6.0–8.5)

## 2017-07-02 LAB — VITAMIN D 25 HYDROXY (VIT D DEFICIENCY, FRACTURES): Vit D, 25-Hydroxy: 17.8 ng/mL — ABNORMAL LOW (ref 30.0–100.0)

## 2017-07-03 ENCOUNTER — Telehealth: Payer: Self-pay | Admitting: Family Medicine

## 2017-07-03 NOTE — Telephone Encounter (Signed)
Error

## 2017-07-18 DIAGNOSIS — F6089 Other specific personality disorders: Secondary | ICD-10-CM | POA: Diagnosis not present

## 2017-07-25 ENCOUNTER — Other Ambulatory Visit: Payer: Self-pay

## 2017-08-05 DIAGNOSIS — N393 Stress incontinence (female) (male): Secondary | ICD-10-CM | POA: Diagnosis not present

## 2017-08-15 DIAGNOSIS — F329 Major depressive disorder, single episode, unspecified: Secondary | ICD-10-CM | POA: Diagnosis not present

## 2017-08-22 DIAGNOSIS — F329 Major depressive disorder, single episode, unspecified: Secondary | ICD-10-CM | POA: Diagnosis not present

## 2017-08-28 ENCOUNTER — Ambulatory Visit: Payer: BLUE CROSS/BLUE SHIELD | Admitting: Neurology

## 2017-09-04 DIAGNOSIS — I10 Essential (primary) hypertension: Secondary | ICD-10-CM | POA: Diagnosis not present

## 2017-09-04 DIAGNOSIS — E1165 Type 2 diabetes mellitus with hyperglycemia: Secondary | ICD-10-CM | POA: Diagnosis not present

## 2017-09-04 DIAGNOSIS — G609 Hereditary and idiopathic neuropathy, unspecified: Secondary | ICD-10-CM | POA: Diagnosis not present

## 2017-09-04 DIAGNOSIS — E669 Obesity, unspecified: Secondary | ICD-10-CM | POA: Diagnosis not present

## 2017-09-12 DIAGNOSIS — F329 Major depressive disorder, single episode, unspecified: Secondary | ICD-10-CM | POA: Diagnosis not present

## 2017-09-13 DIAGNOSIS — E041 Nontoxic single thyroid nodule: Secondary | ICD-10-CM | POA: Diagnosis not present

## 2017-09-16 ENCOUNTER — Other Ambulatory Visit: Payer: Self-pay | Admitting: Family Medicine

## 2017-09-17 ENCOUNTER — Telehealth: Payer: Self-pay | Admitting: Neurology

## 2017-09-17 ENCOUNTER — Ambulatory Visit
Admission: RE | Admit: 2017-09-17 | Discharge: 2017-09-17 | Disposition: A | Discharge status: Home / Self Care | Payer: BLUE CROSS/BLUE SHIELD | Source: Ambulatory Visit | Attending: Neurology | Admitting: Neurology

## 2017-09-17 DIAGNOSIS — E1110 Type 2 diabetes mellitus with ketoacidosis without coma: Secondary | ICD-10-CM

## 2017-09-17 DIAGNOSIS — I6523 Occlusion and stenosis of bilateral carotid arteries: Secondary | ICD-10-CM | POA: Diagnosis not present

## 2017-09-17 DIAGNOSIS — R42 Dizziness and giddiness: Secondary | ICD-10-CM | POA: Diagnosis not present

## 2017-09-17 DIAGNOSIS — I739 Peripheral vascular disease, unspecified: Secondary | ICD-10-CM

## 2017-09-17 DIAGNOSIS — Z794 Long term (current) use of insulin: Secondary | ICD-10-CM

## 2017-09-17 DIAGNOSIS — R202 Paresthesia of skin: Secondary | ICD-10-CM

## 2017-09-17 MED ORDER — IOPAMIDOL (ISOVUE-370) INJECTION 76%
100.0000 mL | Freq: Once | INTRAVENOUS | Status: AC | PRN
  Administered 2017-09-17: 100 mL via INTRAVENOUS

## 2017-09-17 NOTE — Telephone Encounter (Signed)
Please call patient, CT angiogram of the brain and neck showed evidence of multiple intracranial vessel disease,  Please give her a followup visit.  IMPRESSION: 1. Moyamoya disease with tapering of the terminal right internal carotid artery and no discrete right M1 segment. 2. Asymmetric attenuation of right MCA branch vessels which continue to fill either via a small antegrade vessels or in a retrograde fashion. 3. Hypoplastic right A1 segment fills both ACA vessels. 4. Hypoplastic right vertebral artery terminates at the PICA. 5. Minimal atherosclerotic changes in the left carotid bifurcation without significant stenosis.

## 2017-09-17 NOTE — Telephone Encounter (Addendum)
Spoke to patient and her husband - they are aware of the results. I offered to make a follow up but they will need to call back after they look at her husband's schedule.

## 2017-09-18 ENCOUNTER — Telehealth: Payer: Self-pay | Admitting: Neurology

## 2017-09-18 NOTE — Telephone Encounter (Signed)
Pt said she is having symptoms she believes is from plavix. Pt would not go into detail. Please call to advise

## 2017-09-19 NOTE — Telephone Encounter (Signed)
Spoke to patient - she would like to schedule an appt to discuss her medications and test results with Dr. Krista Blue.  She has been scheduled for 09/30/17.

## 2017-09-24 ENCOUNTER — Other Ambulatory Visit: Payer: BLUE CROSS/BLUE SHIELD

## 2017-09-24 DIAGNOSIS — E1142 Type 2 diabetes mellitus with diabetic polyneuropathy: Secondary | ICD-10-CM | POA: Diagnosis not present

## 2017-09-24 DIAGNOSIS — Z794 Long term (current) use of insulin: Secondary | ICD-10-CM

## 2017-09-25 ENCOUNTER — Telehealth: Payer: Self-pay | Admitting: Family Medicine

## 2017-09-25 LAB — INSULIN, RANDOM: INSULIN: 40.4 u[IU]/mL — ABNORMAL HIGH (ref 2.6–24.9)

## 2017-09-25 LAB — C-PEPTIDE: C-Peptide: 1.8 ng/mL (ref 1.1–4.4)

## 2017-09-26 DIAGNOSIS — F329 Major depressive disorder, single episode, unspecified: Secondary | ICD-10-CM | POA: Diagnosis not present

## 2017-09-30 ENCOUNTER — Ambulatory Visit (INDEPENDENT_AMBULATORY_CARE_PROVIDER_SITE_OTHER): Payer: BLUE CROSS/BLUE SHIELD | Admitting: Neurology

## 2017-09-30 ENCOUNTER — Encounter: Payer: Self-pay | Admitting: Neurology

## 2017-09-30 VITALS — BP 167/75 | HR 71 | Ht 66.0 in | Wt 354.5 lb

## 2017-09-30 DIAGNOSIS — I679 Cerebrovascular disease, unspecified: Secondary | ICD-10-CM | POA: Diagnosis not present

## 2017-09-30 NOTE — Telephone Encounter (Signed)
Pt is wanting to make sure their isn't going to be any blood work or anything preventing her to eat before her appt. If possible before 10 give her a call to let her know.

## 2017-09-30 NOTE — Progress Notes (Signed)
PATIENT: Kathryn Jimenez DOB: 1971/03/19  Chief Complaint  Patient presents with  . Follow-up    PCP: Dr. Kenn File. Patient states that she is in the process of trying to get approved for an insulin pump     HISTORICAL  Kathryn Jimenez is 47 years old right-handed female, seen in refer by her primary care physician Celedonio Savage for evaluation of facial numbness, right arm and leg weakness, initial evaluation was on Dec 18 2016.  I reviewed and summarized most recent office visit, she had past medical history of obesity, type 2 diabetes since 1999, insulin-dependent 2007, diabetic peripheral neuropathy, bilateral carpal tunnel syndromes, last A1c was 7.8, chronic neck, low back pain, depression anxiety, PTSD, on disability because of her mental health, she was followed up by psychiatrist, obstructive sleep apnea, using CPAP machine, asthma, irritable bowel syndrome, she also reported a history of right cervical decompression surgery in 2003, she presented with severe right cervical radiculopathy surgery has been very helpful,  She had baseline bilateral lower extremity paresthesia below ankle, intermittent bilateral hands paresthesia due to her carpal tunnel syndromes, but on January 12th 2018 she had a set onset numbness involving her right arm, right face, right flank, right lower extremity, which has been persistent since its onset, she also noticed mild weakness, difficulty to make a tight grip with her right hand,  She was evaluated by [facility] the day of symptoms onset on January twelfth 2018, she presented to the emergency room again on September 21 2016 with her persistent right sided paresthesia, she complains of gait abnormality because of her obesity, previous bilateral ankle injury, especially right ankle  I was able to review the record on Kathryn Jimenez 2 2018, CT head showed no significant abnormality, MRI of the brain was attempted but she could not feed in the machine because of  her weight,   vital signs upon presentation was 136/70  Laboratory evaluations, normal CMP creatinine of 0.53, with exception of elevated glucose 218, normal CBC hemoglobin of 12 point 6,  UPDATE March 05 2017: We have reviewed  laboratory evaluation in May 2018,  showed significant elevated A1c 10.8. elevated cholesterol 231, LDL 151,  Patient stated she has tried different statins in the past, could not tolerate it due to deep muscle achy pain,   Low vitamin D 9.1, is taking vitamin D3 supplement 1000  units, 2 tablets every day,     I personally reviewed MRI of brain MRI report from [place] [facility] dated Jan 16 2017  MRI of brain nonspecific patchy T2/FLAIR hyperintensity within the central pons, few age advanced chronic ischemic changes, sagittal T2 at the posterior lateral left thalamus, could represent a tiny old small vessel ischemic changes, that was negative for DWI, right M1 segment appears occluded, numerous small collateral vessels are present in the region of right M1 segment, distal right MCA branches are present, although they demonstrate diminished flow signal,  MRA of brain right M1 segment occluded, just off its origin, more distal right MCA branches are present, left MCA are unremarkable, stenosis or occlusion involving the paraclinoid segment of the distal right ICA, proximal segment of right MCA, bilateral ACA, the appearance wrist the possibility of moyamoya disease, age advanced intracranial atherosclerotic  other vasculopathy  MRA of neck no significant carotid or vertebral artery stenosis within the neck, changes mostly involving the intracranial vessels,  echocardiogram in July 2018: Ejection fraction 60-65%, no structural lesion identified  She is now  taking Plavix daily, there was no recurrent focal symptoms,   UPDATE Sep 30 2017: I personally reviewed CT angiogram in January 2019, moyamoya disease with tapering of the terminal right internal  carotid artery and no discrete right M1 segment, asymmetric attenuation of the right MCA branches which continues to fill eiither via a small anterograde vessels or in a retrograde fashion,  Laboratory evaluation in November 2018: LDL was 119, cholesterol was 230, triglyceride was 336, normal CBC, CMP showed creatinine of 0.65, glucose was 202, A1c was 9.0, vitamin D was 7.8  She has no new neurological symptoms,   REVIEW OF SYSTEMS: Full 14 system review of systems performed and notable only for activity changes, chills fatigue, blurred vision, shortness of breath, incontinence of bladder, insomnia, apnea, depression, anxiety, memory loss, headaches.  ALLERGIES: Allergies  Allergen Reactions  . Augmentin [Amoxicillin-Pot Clavulanate] Nausea Only    Causes severe diarrhea  . Lopid [Gemfibrozil] Other (See Comments)    Burning tounge  . Naproxen Sodium Other (See Comments)    Patient states she felt loopy  . Morphine And Related Itching and Rash    HOME MEDICATIONS: Current Outpatient Medications  Medication Sig Dispense Refill  . acetaminophen (TYLENOL) 500 MG tablet Take 500 mg by mouth every 6 (six) hours as needed.    Marland Kitchen albuterol (PROVENTIL HFA;VENTOLIN HFA) 108 (90 BASE) MCG/ACT inhaler Inhale 2 puffs into the lungs 4 (four) times daily as needed for wheezing or shortness of breath.    Marland Kitchen b complex vitamins capsule Take 1 capsule by mouth daily.    Marland Kitchen buPROPion (WELLBUTRIN XL) 150 MG 24 hr tablet Take 150 mg by mouth daily.    . clopidogrel (PLAVIX) 75 MG tablet Take 1 tablet (75 mg total) by mouth daily. 30 tablet 11  . clotrimazole (LOTRIMIN) 1 % cream Apply 1 application topically as needed.     . docusate sodium (COLACE) 100 MG capsule Take 2 capsules (200 mg total) by mouth at bedtime. (Patient taking differently: Take 200 mg by mouth as needed. )  0  . doxepin (SINEQUAN) 10 MG capsule Take 10 mg by mouth 3 (three) times daily.     Marland Kitchen FLUoxetine (PROZAC) 20 MG capsule Take 20 mg  by mouth daily.    . furosemide (LASIX) 40 MG tablet Take 40 mg by mouth daily.    . insulin aspart (NOVOLOG) 100 UNIT/ML injection Inject 60 Units into the skin 3 (three) times daily before meals. ro in am , 50 and lunc and 50 pm    . insulin NPH Human (HUMULIN N,NOVOLIN N) 100 UNIT/ML injection Inject into the skin 3 (three) times daily. 75 units in the morning, 75 units at lunch, 60 units at bedtime.    Marland Kitchen lisinopril (PRINIVIL,ZESTRIL) 20 MG tablet TAKE 1 TABLET ONCE DAILY. 30 tablet 3  . LORazepam (ATIVAN) 0.5 MG tablet Take 0.5 mg by mouth. Takes 0.25mg  - 0.50mg , 1-2 time daily as needed.    . medroxyPROGESTERone (PROVERA) 10 MG tablet Take 10 mg by mouth See admin instructions. Reported on 12/26/2015    . nystatin ointment (MYCOSTATIN) Apply 1 application topically 3 (three) times daily as needed.    . potassium chloride (K-DUR) 10 MEQ tablet Take 20 mEq by mouth daily.    . pregabalin (LYRICA) 225 MG capsule Take 1 capsule (225 mg total) 2 (two) times daily by mouth. 60 capsule 2  . valACYclovir (VALTREX) 500 MG tablet Take 500 mg by mouth daily.    . vitamin  B-12 (CYANOCOBALAMIN) 1000 MCG tablet Take 2,000 mcg by mouth daily.      No current facility-administered medications for this visit.     PAST MEDICAL HISTORY: Past Medical History:  Diagnosis Date  . Allergy   . Anginal pain ([place])   . Anxiety   . Arthritis   . Asthma   . Bladder disorder, other   . Borderline personality disorder ([place])   . Carotid artery disease ([place]) 03/05/2017  . Depression   . Diabetes mellitus without complication ([place])   . GERD (gastroesophageal reflux disease)   . Headache   . Hyperlipidemia   . Hypertension   . Kidney stones   . Neuromuscular disorder ([facility])    Neuropathy  . PTSD (post-traumatic stress disorder)   . Shortness of breath   . Sleep apnea   . Stroke [facility])     PAST SURGICAL HISTORY: Past Surgical History:  Procedure Laterality Date  . CARPAL TUNNEL RELEASE     left 2015  .  CERVICAL FUSION Bilateral 2003   C 5-6 with titanium screws  . CHOLECYSTECTOMY    . COLONOSCOPY WITH PROPOFOL N/A 06/24/2014   Procedure: COLONOSCOPY WITH PROPOFOL;  Surgeon: Rogene Houston, MD;  Location: [facility];  Service: Endoscopy;  Laterality: N/A;  cecum time in 0810    time out   0821    total time 11 minutes  . POLYPECTOMY N/A 06/24/2014   Procedure: RECTAL POLYPECTOMY;  Surgeon: Rogene Houston, MD;  Location: [facility];  Service: Endoscopy;  Laterality: N/A;  . SPINE SURGERY    . URETHRAL DILATION  1981  . WISDOM TOOTH EXTRACTION Bilateral 2001 and 1990s   top and bottom     FAMILY HISTORY: Family History  Problem Relation Age of Onset  . COPD Mother   . Arthritis Mother   . Asthma Mother   . Cancer Mother        skin  . Depression Mother   . Diabetes Mother   . Heart disease Mother   . Hyperlipidemia Mother   . Hypertension Mother   . Mental illness Mother   . Lung cancer Father   . Arthritis Father   . Cancer Father        lung  . Depression Father   . Drug abuse Father   . Early death Father   . Kidney disease Father   . Mental illness Father   . Colon cancer Sister   . Birth defects Sister   . Cancer Sister        breast  . COPD Sister   . Hyperlipidemia Sister   . Learning disabilities Sister   . Breast cancer Sister   . Cancer Sister        colon  . Alcohol abuse Paternal Grandmother   . Diabetes Paternal Grandmother   . Alcohol abuse Paternal Grandfather     SOCIAL HISTORY:  Social History   Socioeconomic History  . Marital status: Married    Spouse name: Not on file  . Number of children: 0  . Years of education: HS  . Highest education level: Not on file  Social Needs  . Financial resource strain: Not on file  . Food insecurity - worry: Not on file  . Food insecurity - inability: Not on file  . Transportation needs - medical: Not on file  . Transportation needs - non-medical: Not on file  Occupational History  . Occupation: Disabled    Tobacco Use  . Smoking  status: Never Smoker  . Smokeless tobacco: Never Used  Substance and Sexual Activity  . Alcohol use: No    Comment: Rarely  . Drug use: No  . Sexual activity: Not Currently    Birth control/protection: Pill  Other Topics Concern  . Not on file  Social History Narrative   Lives at home with husband.   Right-handed.   3-4 cups caffeine per day.     PHYSICAL EXAM   Vitals:   09/30/17 1139  BP: (!) 167/75  Pulse: 71  Weight: (!) 354 lb 8 oz (160.8 kg)  Height: 5\' 6"  (1.676 m)    Not recorded      Body mass index is 57.22 kg/m.  PHYSICAL EXAMNIATION:  Gen: NAD, conversant, well nourised, obese, well groomed                     Cardiovascular: Regular rate rhythm, no peripheral edema, warm, nontender. Eyes: Conjunctivae clear without exudates or hemorrhage Neck: Supple, no carotid bruits. Pulmonary: Clear to auscultation bilaterally   NEUROLOGICAL EXAM:  MENTAL STATUS:Obese depressed looking middle-age female  Speech:    Speech is normal; fluent and spontaneous with normal comprehension.  Cognition:     Orientation to time, place and person     Normal recent and remote memory     Normal Attention span and concentration     Normal Language, naming, repeating,spontaneous speech     Fund of knowledge   CRANIAL NERVES: CN II: Visual fields are full to confrontation. Fundoscopic exam is normal with sharp discs and no vascular changes. Pupils are round equal and briskly reactive to light. CN III, IV, VI: extraocular movement are normal. No ptosis. CN V: Facial sensation is intact to pinprick in all 3 divisions bilaterally. Corneal responses are intact.  CN VII: Face is symmetric with normal eye closure and smile. CN VIII: Hearing is normal to rubbing fingers CN IX, X: Palate elevates symmetrically. Phonation is normal. CN XI: Head turning and shoulder shrug are intact CN XII: Tongue is midline with normal movements and no  atrophy.  MOTOR: There is no pronator drift of out-stretched arms. Muscle bulk and tone are normal.Mild weakness at right hand grip,   REFLEXES: Reflexes are  1  and symmetric at the biceps, triceps, knees, and  trace at ankles. Plantar responses are flexor.  SENSORY:Length dependent decreased to  light touch, pinprick,  and vibratory sensation at toes,   COORDINATION: Rapid alternating movements and fine finger movements are intact. There is no dysmetria on finger-to-nose and heel-knee-shin.    GAIT/STANCE: She needs push up to get up from seated position, antalgic, also limited by her big body habitus.   DIAGNOSTIC DATA (LABS, IMAGING, TESTING) - I reviewed patient records, labs, notes, testing and imaging myself where available.   ASSESSMENT AND PLAN  Kathryn Jimenez is a 47 y.o. female   Abnormal angiogram  CT angiogram showed moyamoya disease with tapering of the right internal carotid artery, no discrete right M1 segment, asymmetric attenuation of right MCA branch vessels with speech continue to feel either while small anterograde vessel also in a retrograde fashion, hypoplastic right A1 segment few both ACA vessels.    Continue to optimize vascular risk factor control,  Keep Plavix 75 mg daily  Marcial Pacas, M.D. Ph.D.  [facility] [address] [place], [place] [address] Ph: [phone] Fax: [phone]  CC: Celedonio Savage, MD

## 2017-09-30 NOTE — Telephone Encounter (Signed)
Spoke to patient - she is aware that fasting labs will not be ordered today.

## 2017-10-01 ENCOUNTER — Encounter: Payer: Self-pay | Admitting: Neurology

## 2017-10-03 ENCOUNTER — Ambulatory Visit: Payer: BLUE CROSS/BLUE SHIELD | Admitting: Family Medicine

## 2017-10-04 ENCOUNTER — Ambulatory Visit: Payer: Self-pay | Admitting: Family Medicine

## 2017-10-07 NOTE — Telephone Encounter (Signed)
Pt would like to discuss the cyst in the sinus area. Said she's has sinus problems all her life. Please call to discuss at [phone]

## 2017-10-07 NOTE — Telephone Encounter (Signed)
Dr. Krista Blue has review results again. Per vo by Dr. Krista Blue, cyst is not the cause of her repeat sinus infections and seasonal allergies. If she is having continued problems, recommended follow up with her PCP or her ENT.  She is agreeable to this plan.

## 2017-10-08 DIAGNOSIS — Z794 Long term (current) use of insulin: Secondary | ICD-10-CM | POA: Diagnosis not present

## 2017-10-08 DIAGNOSIS — E119 Type 2 diabetes mellitus without complications: Secondary | ICD-10-CM | POA: Diagnosis not present

## 2017-10-08 DIAGNOSIS — E118 Type 2 diabetes mellitus with unspecified complications: Secondary | ICD-10-CM | POA: Diagnosis not present

## 2017-10-08 DIAGNOSIS — I10 Essential (primary) hypertension: Secondary | ICD-10-CM | POA: Diagnosis not present

## 2017-10-22 ENCOUNTER — Other Ambulatory Visit: Payer: BLUE CROSS/BLUE SHIELD

## 2017-10-22 DIAGNOSIS — E1165 Type 2 diabetes mellitus with hyperglycemia: Secondary | ICD-10-CM | POA: Diagnosis not present

## 2017-10-24 DIAGNOSIS — F329 Major depressive disorder, single episode, unspecified: Secondary | ICD-10-CM | POA: Diagnosis not present

## 2017-10-29 DIAGNOSIS — B369 Superficial mycosis, unspecified: Secondary | ICD-10-CM | POA: Diagnosis not present

## 2017-10-29 DIAGNOSIS — Z6841 Body Mass Index (BMI) 40.0 and over, adult: Secondary | ICD-10-CM | POA: Diagnosis not present

## 2017-11-04 DIAGNOSIS — F329 Major depressive disorder, single episode, unspecified: Secondary | ICD-10-CM | POA: Diagnosis not present

## 2017-11-07 ENCOUNTER — Encounter: Payer: Self-pay | Admitting: Family Medicine

## 2017-11-07 ENCOUNTER — Ambulatory Visit (INDEPENDENT_AMBULATORY_CARE_PROVIDER_SITE_OTHER): Payer: BLUE CROSS/BLUE SHIELD | Admitting: Family Medicine

## 2017-11-07 VITALS — BP 151/79 | HR 84 | Temp 97.9°F | Ht 66.0 in | Wt 359.6 lb

## 2017-11-07 DIAGNOSIS — M7989 Other specified soft tissue disorders: Secondary | ICD-10-CM

## 2017-11-07 DIAGNOSIS — E1142 Type 2 diabetes mellitus with diabetic polyneuropathy: Secondary | ICD-10-CM | POA: Diagnosis not present

## 2017-11-07 MED ORDER — PREGABALIN 225 MG PO CAPS: 225.0000 mg | ORAL_CAPSULE | Freq: Two times a day (BID) | ORAL | 2 refills | Status: DC

## 2017-11-07 MED ORDER — FUROSEMIDE 20 MG PO TABS: 20.0000 mg | ORAL_TABLET | Freq: Every day | ORAL | 3 refills | Status: DC

## 2017-11-07 NOTE — Progress Notes (Signed)
   HPI  Patient presents today follow-up chronic medical conditions.  Patient is frustrated with her multiple medical problems.  She states that she has had slightly worse than usual right greater than left leg swelling over the last 4-5 days.  She states that this is not unusual for her she denies calf pain, fever, chills, sweats, or concern for blood clot. She states that she has been checked for blood clot before, she denies history of blood clot. She denies calf tenderness.  Patien needs a refill of Lasix, states that Lasix typically improves her leg swelling. She takes 10-20 mg every 3-4 days as needed for leg swelling.  Patient also requests refill of Lyrica, she uses this for diabetic neuropathy states that it is very effective.  He sees endocrinology for diabetic treatment  PMH: Smoking status noted ROS: Per HPI  Objective: BP (!) 151/79   Pulse 84   Temp 97.9 F (36.6 C) (Oral)   Ht 5\' 6"  (1.676 m)   Wt (!) 359 lb 9.6 oz (163.1 kg)   BMI 58.04 kg/m  Gen: NAD, alert, cooperative with exam HEENT: NCAT CV: RRR, good S1/S2, no murmur Resp: CTABL, no wheezes, non-labored Ext: No edema, warm Neuro: Alert and oriented, No gross deficits  Assessment and plan:  #Diabetic neuropathy Refill Lyrica No changes Controlled  #Leg swelling Typical leg swelling for patient Refilled Lasix Circumference in the right to 51 cm compared to 48 cm on the left, patient has no calf tenderness, mild erythema distally without any warmth or concern for cellulitis Discussed red flags and reasons to seek emergency medical care return to clinic with patient, discussed DVT and dangers of DVT, she does not want further workup at this time  I believe DVT unlikely given exam.   Morbid obesity Pt is working hard on her diet, she is doing well Improving Discussed bariatric surgery- she is not interested due to cost.    Meds ordered this encounter  Medications  . furosemide (LASIX) 20 MG  tablet    Sig: Take 1 tablet (20 mg total) by mouth daily.    Dispense:  30 tablet    Refill:  3  . pregabalin (LYRICA) 225 MG capsule    Sig: Take 1 capsule (225 mg total) by mouth 2 (two) times daily.    Dispense:  60 capsule    Refill:  [place], MD [facility] 11/07/2017, 9:08 AM

## 2017-11-08 ENCOUNTER — Telehealth: Payer: Self-pay | Admitting: Family Medicine

## 2017-11-08 MED ORDER — OSELTAMIVIR PHOSPHATE 75 MG PO CAPS: 75.0000 mg | ORAL_CAPSULE | Freq: Every day | ORAL | 0 refills | Status: DC

## 2017-11-08 NOTE — Telephone Encounter (Signed)
Pt's husband dxed with Flu A Pt wants Tamiflu sent into pharmacy for prophylaxis Please advise

## 2017-11-08 NOTE — Telephone Encounter (Signed)
Pt notified RX sent to pharmacy

## 2017-11-08 NOTE — Telephone Encounter (Signed)
Tamiflu Prescription sent to pharmacy   

## 2017-11-12 DIAGNOSIS — E1165 Type 2 diabetes mellitus with hyperglycemia: Secondary | ICD-10-CM | POA: Diagnosis not present

## 2017-11-18 DIAGNOSIS — E1165 Type 2 diabetes mellitus with hyperglycemia: Secondary | ICD-10-CM | POA: Diagnosis not present

## 2017-11-21 DIAGNOSIS — N939 Abnormal uterine and vaginal bleeding, unspecified: Secondary | ICD-10-CM | POA: Diagnosis not present

## 2017-11-21 DIAGNOSIS — Z01419 Encounter for gynecological examination (general) (routine) without abnormal findings: Secondary | ICD-10-CM | POA: Diagnosis not present

## 2017-11-21 DIAGNOSIS — Z6841 Body Mass Index (BMI) 40.0 and over, adult: Secondary | ICD-10-CM | POA: Diagnosis not present

## 2017-11-21 DIAGNOSIS — F329 Major depressive disorder, single episode, unspecified: Secondary | ICD-10-CM | POA: Diagnosis not present

## 2017-11-28 DIAGNOSIS — F329 Major depressive disorder, single episode, unspecified: Secondary | ICD-10-CM | POA: Diagnosis not present

## 2017-12-02 ENCOUNTER — Other Ambulatory Visit: Payer: Self-pay | Admitting: *Deleted

## 2017-12-03 MED ORDER — POTASSIUM CHLORIDE ER 10 MEQ PO TBCR: 20.0000 meq | EXTENDED_RELEASE_TABLET | Freq: Every day | ORAL | 1 refills | Status: DC

## 2017-12-12 DIAGNOSIS — F329 Major depressive disorder, single episode, unspecified: Secondary | ICD-10-CM | POA: Diagnosis not present

## 2017-12-18 DIAGNOSIS — E1165 Type 2 diabetes mellitus with hyperglycemia: Secondary | ICD-10-CM | POA: Diagnosis not present

## 2018-01-02 DIAGNOSIS — F329 Major depressive disorder, single episode, unspecified: Secondary | ICD-10-CM | POA: Diagnosis not present

## 2018-01-06 DIAGNOSIS — E7800 Pure hypercholesterolemia, unspecified: Secondary | ICD-10-CM | POA: Diagnosis not present

## 2018-01-06 DIAGNOSIS — I10 Essential (primary) hypertension: Secondary | ICD-10-CM | POA: Diagnosis not present

## 2018-01-06 DIAGNOSIS — G609 Hereditary and idiopathic neuropathy, unspecified: Secondary | ICD-10-CM | POA: Diagnosis not present

## 2018-01-06 DIAGNOSIS — E1165 Type 2 diabetes mellitus with hyperglycemia: Secondary | ICD-10-CM | POA: Diagnosis not present

## 2018-01-09 DIAGNOSIS — F6089 Other specific personality disorders: Secondary | ICD-10-CM | POA: Diagnosis not present

## 2018-01-20 DIAGNOSIS — E1165 Type 2 diabetes mellitus with hyperglycemia: Secondary | ICD-10-CM | POA: Diagnosis not present

## 2018-01-20 DIAGNOSIS — F329 Major depressive disorder, single episode, unspecified: Secondary | ICD-10-CM | POA: Diagnosis not present

## 2018-01-23 DIAGNOSIS — F329 Major depressive disorder, single episode, unspecified: Secondary | ICD-10-CM | POA: Diagnosis not present

## 2018-01-27 ENCOUNTER — Other Ambulatory Visit: Payer: Self-pay | Admitting: Family Medicine

## 2018-01-30 DIAGNOSIS — F329 Major depressive disorder, single episode, unspecified: Secondary | ICD-10-CM | POA: Diagnosis not present

## 2018-02-05 DIAGNOSIS — E118 Type 2 diabetes mellitus with unspecified complications: Secondary | ICD-10-CM | POA: Diagnosis not present

## 2018-02-05 DIAGNOSIS — I10 Essential (primary) hypertension: Secondary | ICD-10-CM | POA: Diagnosis not present

## 2018-02-05 DIAGNOSIS — E119 Type 2 diabetes mellitus without complications: Secondary | ICD-10-CM | POA: Diagnosis not present

## 2018-02-05 DIAGNOSIS — Z794 Long term (current) use of insulin: Secondary | ICD-10-CM | POA: Diagnosis not present

## 2018-02-07 ENCOUNTER — Ambulatory Visit: Payer: BLUE CROSS/BLUE SHIELD | Admitting: Family Medicine

## 2018-02-10 ENCOUNTER — Other Ambulatory Visit: Payer: Self-pay | Admitting: Family Medicine

## 2018-02-13 DIAGNOSIS — F329 Major depressive disorder, single episode, unspecified: Secondary | ICD-10-CM | POA: Diagnosis not present

## 2018-02-17 DIAGNOSIS — E1165 Type 2 diabetes mellitus with hyperglycemia: Secondary | ICD-10-CM | POA: Diagnosis not present

## 2018-02-24 ENCOUNTER — Other Ambulatory Visit: Payer: Self-pay | Admitting: Neurology

## 2018-02-24 ENCOUNTER — Other Ambulatory Visit: Payer: Self-pay | Admitting: Family Medicine

## 2018-02-25 ENCOUNTER — Encounter: Payer: Self-pay | Admitting: Family Medicine

## 2018-02-25 ENCOUNTER — Ambulatory Visit (INDEPENDENT_AMBULATORY_CARE_PROVIDER_SITE_OTHER): Payer: BLUE CROSS/BLUE SHIELD | Admitting: Family Medicine

## 2018-02-25 VITALS — BP 115/62 | HR 80 | Temp 98.7°F | Ht 66.0 in | Wt 352.2 lb

## 2018-02-25 DIAGNOSIS — E1142 Type 2 diabetes mellitus with diabetic polyneuropathy: Secondary | ICD-10-CM | POA: Diagnosis not present

## 2018-02-25 DIAGNOSIS — I10 Essential (primary) hypertension: Secondary | ICD-10-CM | POA: Diagnosis not present

## 2018-02-25 DIAGNOSIS — F329 Major depressive disorder, single episode, unspecified: Secondary | ICD-10-CM

## 2018-02-25 DIAGNOSIS — F32A Depression, unspecified: Secondary | ICD-10-CM

## 2018-02-25 MED ORDER — DOXEPIN HCL 10 MG PO CAPS: 10.0000 mg | ORAL_CAPSULE | Freq: Three times a day (TID) | ORAL | 5 refills | Status: DC

## 2018-02-25 MED ORDER — CLOPIDOGREL BISULFATE 75 MG PO TABS: ORAL_TABLET | ORAL | 11 refills | Status: DC

## 2018-02-25 MED ORDER — LISINOPRIL 20 MG PO TABS: 20.0000 mg | ORAL_TABLET | Freq: Every day | ORAL | 5 refills | Status: DC

## 2018-02-25 MED ORDER — FUROSEMIDE 20 MG PO TABS: 20.0000 mg | ORAL_TABLET | Freq: Every day | ORAL | 3 refills | Status: DC

## 2018-02-25 MED ORDER — PREGABALIN 225 MG PO CAPS: 225.0000 mg | ORAL_CAPSULE | Freq: Two times a day (BID) | ORAL | 2 refills | Status: DC

## 2018-02-25 MED ORDER — POTASSIUM CHLORIDE ER 10 MEQ PO TBCR: 20.0000 meq | EXTENDED_RELEASE_TABLET | Freq: Every day | ORAL | 2 refills | Status: DC

## 2018-02-25 NOTE — Progress Notes (Signed)
   HPI  Patient presents today for follow-up chronic medical conditions.  Depression Patient has some thoughts of being better off dead, however she denies any suicidal thoughts.  She states that she recently turned 47 years old and has realized that she will likely never have children.  This is been very difficult for her.  Hypertension Good medication compliance No headache or chest pain.  Diabetic neuropathy Patient is managed with Lyrica, needs refill today.  Working well.  PMH: Smoking status noted ROS: Per HPI  Objective: BP 115/62   Pulse 80   Temp 98.7 F (37.1 C) (Oral)   Ht 5\' 6"  (1.676 m)   Wt (!) 352 lb 3.2 oz (159.8 kg)   BMI 56.85 kg/m  Gen: NAD, alert, cooperative with exam HEENT: NCAT CV: RRR, good S1/S2, no murmur Resp: CTABL, no wheezes, non-labored Ext: No edema, warm Neuro: Alert and oriented, No gross deficits  Assessment and plan:  #Diabetic neuropathy Managed well with Lyrica, refill  #Hypertension Well-controlled No changes Labs up-to-date  #Depression Patient contracts for safety, does have some passive thoughts of being better off dead.  She denies any thoughts of actually hurting her self. She does have psychiatry follow-up at [facility] ordered this encounter  Medications  . doxepin (SINEQUAN) 10 MG capsule    Sig: Take 1 capsule (10 mg total) by mouth 3 (three) times daily.    Dispense:  90 capsule    Refill:  5  . furosemide (LASIX) 20 MG tablet    Sig: Take 1 tablet (20 mg total) by mouth daily.    Dispense:  30 tablet    Refill:  3  . lisinopril (PRINIVIL,ZESTRIL) 20 MG tablet    Sig: Take 1 tablet (20 mg total) by mouth daily.    Dispense:  30 tablet    Refill:  5  . potassium chloride (K-DUR) 10 MEQ tablet    Sig: Take 2 tablets (20 mEq total) by mouth daily.    Dispense:  90 tablet    Refill:  2  . pregabalin (LYRICA) 225 MG capsule    Sig: Take 1 capsule (225 mg total) by mouth 2 (two) times daily.   Dispense:  60 capsule    Refill:  2  . clopidogrel (PLAVIX) 75 MG tablet    Sig: TAKE (1) TABLET BY MOUTH ONCE DAILY.    Dispense:  30 tablet    Refill:  [place], MD [facility] 02/25/2018, 11:09 AM

## 2018-02-25 NOTE — Patient Instructions (Signed)
Great to see you!  Come back in 3 months to See [name]

## 2018-02-27 DIAGNOSIS — F329 Major depressive disorder, single episode, unspecified: Secondary | ICD-10-CM | POA: Diagnosis not present

## 2018-03-05 ENCOUNTER — Other Ambulatory Visit: Payer: Self-pay | Admitting: Family Medicine

## 2018-03-05 ENCOUNTER — Other Ambulatory Visit: Payer: Self-pay | Admitting: Physician Assistant

## 2018-03-05 MED ORDER — KETOCONAZOLE 2 % EX CREA: 1.0000 "application " | TOPICAL_CREAM | Freq: Every day | CUTANEOUS | 0 refills | Status: DC

## 2018-03-05 NOTE — Telephone Encounter (Signed)
Patient aware of results.

## 2018-03-05 NOTE — Telephone Encounter (Signed)
Lm- not on med list- Patient is to call back

## 2018-03-05 NOTE — Telephone Encounter (Signed)
Sent order.

## 2018-03-05 NOTE — Telephone Encounter (Signed)
Patient is requesting a refill of ketonazole 2% cream.  States she it was given by Dr. Modena Nunnery but he is booked up for months.  Not on med list- states she is taking it for psoriasis and uses PRN.  Covering PCP- please advise

## 2018-03-06 ENCOUNTER — Other Ambulatory Visit: Payer: Self-pay | Admitting: Family Medicine

## 2018-03-13 DIAGNOSIS — F6089 Other specific personality disorders: Secondary | ICD-10-CM | POA: Diagnosis not present

## 2018-03-19 DIAGNOSIS — F6089 Other specific personality disorders: Secondary | ICD-10-CM | POA: Diagnosis not present

## 2018-03-20 DIAGNOSIS — E1165 Type 2 diabetes mellitus with hyperglycemia: Secondary | ICD-10-CM | POA: Diagnosis not present

## 2018-03-24 DIAGNOSIS — F6089 Other specific personality disorders: Secondary | ICD-10-CM | POA: Diagnosis not present

## 2018-03-28 DIAGNOSIS — F6089 Other specific personality disorders: Secondary | ICD-10-CM | POA: Diagnosis not present

## 2018-04-04 DIAGNOSIS — N393 Stress incontinence (female) (male): Secondary | ICD-10-CM | POA: Diagnosis not present

## 2018-04-14 DIAGNOSIS — F329 Major depressive disorder, single episode, unspecified: Secondary | ICD-10-CM | POA: Diagnosis not present

## 2018-04-22 DIAGNOSIS — E1165 Type 2 diabetes mellitus with hyperglycemia: Secondary | ICD-10-CM | POA: Diagnosis not present

## 2018-05-13 ENCOUNTER — Telehealth: Payer: Self-pay

## 2018-05-13 NOTE — Telephone Encounter (Signed)
Patient has tried gabapentin and used the maximum dose and she has not tried duloxetine.

## 2018-05-13 NOTE — Telephone Encounter (Signed)
Please let patient know, and has she ever taken gabapentin or duloxetine? Supposed to follow up with me in early October per Dr. Alen Bleacher note

## 2018-05-13 NOTE — Telephone Encounter (Signed)
Last seen Dr Wendi Snipes   Medicaid non preferred

## 2018-05-13 NOTE — Telephone Encounter (Signed)
Last seen Dr Wendi Snipes   Medicaid non preferred Pregabalin  Preferred are Duloxetine cap., and Gabapentin Capsule/ tablet

## 2018-05-13 NOTE — Telephone Encounter (Signed)
Patient has tried and failed max dose of gabapentin.  Can the Lyrica be prior Auth?  Or does duloxetine still need to be tried?

## 2018-05-15 ENCOUNTER — Telehealth: Payer: Self-pay

## 2018-05-15 NOTE — Telephone Encounter (Signed)
Lyrica was approved.

## 2018-05-20 DIAGNOSIS — E1165 Type 2 diabetes mellitus with hyperglycemia: Secondary | ICD-10-CM | POA: Diagnosis not present

## 2018-05-29 ENCOUNTER — Ambulatory Visit (INDEPENDENT_AMBULATORY_CARE_PROVIDER_SITE_OTHER): Payer: BLUE CROSS/BLUE SHIELD

## 2018-05-29 ENCOUNTER — Ambulatory Visit (INDEPENDENT_AMBULATORY_CARE_PROVIDER_SITE_OTHER): Payer: BLUE CROSS/BLUE SHIELD | Admitting: Physician Assistant

## 2018-05-29 ENCOUNTER — Encounter: Payer: Self-pay | Admitting: Physician Assistant

## 2018-05-29 VITALS — BP 121/61 | HR 76 | Temp 98.7°F | Ht 66.0 in | Wt 349.0 lb

## 2018-05-29 DIAGNOSIS — E65 Localized adiposity: Secondary | ICD-10-CM

## 2018-05-29 DIAGNOSIS — N906 Unspecified hypertrophy of vulva: Secondary | ICD-10-CM

## 2018-05-29 DIAGNOSIS — Z23 Encounter for immunization: Secondary | ICD-10-CM | POA: Diagnosis not present

## 2018-05-29 DIAGNOSIS — I10 Essential (primary) hypertension: Secondary | ICD-10-CM | POA: Diagnosis not present

## 2018-05-29 DIAGNOSIS — M79672 Pain in left foot: Secondary | ICD-10-CM

## 2018-05-29 DIAGNOSIS — N9089 Other specified noninflammatory disorders of vulva and perineum: Secondary | ICD-10-CM | POA: Insufficient documentation

## 2018-05-29 DIAGNOSIS — M76899 Other specified enthesopathies of unspecified lower limb, excluding foot: Secondary | ICD-10-CM

## 2018-05-29 MED ORDER — POTASSIUM CHLORIDE ER 10 MEQ PO TBCR: 20.0000 meq | EXTENDED_RELEASE_TABLET | Freq: Every day | ORAL | 3 refills | Status: DC

## 2018-05-29 MED ORDER — ALBUTEROL SULFATE HFA 108 (90 BASE) MCG/ACT IN AERS: INHALATION_SPRAY | RESPIRATORY_TRACT | 5 refills | Status: DC

## 2018-05-29 MED ORDER — LISINOPRIL 20 MG PO TABS: 20.0000 mg | ORAL_TABLET | Freq: Every day | ORAL | 5 refills | Status: DC

## 2018-05-29 MED ORDER — PREGABALIN 225 MG PO CAPS: 225.0000 mg | ORAL_CAPSULE | Freq: Two times a day (BID) | ORAL | 5 refills | Status: DC

## 2018-05-29 MED ORDER — CLOPIDOGREL BISULFATE 75 MG PO TABS: ORAL_TABLET | ORAL | 11 refills | Status: DC

## 2018-05-29 MED ORDER — FUROSEMIDE 20 MG PO TABS: 20.0000 mg | ORAL_TABLET | Freq: Every day | ORAL | 6 refills | Status: DC

## 2018-05-29 NOTE — Progress Notes (Signed)
BP 121/61   Pulse 76   Temp 98.7 F (37.1 C) (Oral)   Ht 5\' 6"  (1.676 m)   Wt (!) 349 lb (158.3 kg)   BMI 56.33 kg/m    Subjective:    Patient ID: Kathryn Jimenez, female    DOB: 29-Jul-1971, 47 y.o.   MRN: [id number]  HPI: Kathryn Jimenez is a 47 y.o. female presenting on 05/29/2018 for Medical Management of Chronic Issues Patient comes in to be established with me.  She had been a former patient with Dr. blues and Dr. Wendi Snipes.  We have reviewed all of her past medical history and are dealing with some issues that she is currently having.  She is having the greatest concern about her right labia being very enlarged.  There is also a fluctuant line of fluid in the labia.  It gets very irritated with close and friction, sometimes her underwear will not cover it.  And when she has it.  It gets soiled from the blood because she has to usually wear a depend.  She denies any warmth, drainage, no fever or chills.  She does have an appointment very soon with Dr. Barrie Dunker.  I am going to forward a note to him to let him know that we had talked about this.  We did not discuss which type of physician may need to look at it a gynecologist versus a Psychiatric nurse.  She has been trying to do leg exercises while laying down.  She is not able to be up and exercise for very long.  And she currently has a little bit of pain in the left knee tendon.  It starts just above the kneecap goes around the kneecap and inserts down into her tibia.  When she moves her like she can feel the pain.  There is no been no redness or swelling of the area.  And finally her left foot has been having an unusual feeling along the left side.  It is not severely painful but feels different and has a more limited range of motion.  Years ago she had problems with her feet and ankles but otherwise they have been doing quite good lately.  She is still seeing Dr. Chalmers Cater for her endocrinology issues.  They are doing labs every 6 months.  Her  last A1c was 6.2 which is wonderful number for her. She is down another 10 pounds since March.  She is also still seeing psychiatry at [facility], she gets followed medically and counseling with them.   Past Medical History:  Diagnosis Date  . Allergy   . Anginal pain ([place])   . Anxiety   . Arthritis   . Asthma   . Bladder disorder, other   . Borderline personality disorder ([place])   . Carotid artery disease ([place]) 03/05/2017  . Depression   . Diabetes mellitus without complication ([place])   . GERD (gastroesophageal reflux disease)   . Headache   . Hyperlipidemia   . Hypertension   . Kidney stones   . Neuromuscular disorder (HCC)    Neuropathy  . PTSD (post-traumatic stress disorder)   . Shortness of breath   . Sleep apnea   . Stroke [facility])    Relevant past medical, surgical, family and social history reviewed and updated as indicated. Interim medical history since our last visit reviewed. Allergies and medications reviewed and updated. DATA REVIEWED: CHART IN EPIC  Family History reviewed for pertinent findings.  Review of Systems  Constitutional: Positive for fatigue.  HENT: Negative.   Eyes: Negative.   Respiratory: Negative.  Negative for shortness of breath and wheezing.   Cardiovascular: Negative for chest pain and palpitations.  Gastrointestinal: Negative.   Genitourinary: Positive for menstrual problem. Negative for decreased urine volume, difficulty urinating, dysuria, flank pain, genital sores, vaginal bleeding, vaginal discharge and vaginal pain.  Musculoskeletal: Positive for arthralgias.    Allergies as of 05/29/2018      Reactions   Augmentin [amoxicillin-pot Clavulanate] Nausea Only   Causes severe diarrhea   Lopid [gemfibrozil] Other (See Comments)   Burning tounge   Naproxen Sodium Other (See Comments)   Patient states she felt loopy   Morphine And Related Itching, Rash      Medication List        Accurate as of 05/29/18 11:26 AM. Always use your  most recent med list.          acetaminophen 500 MG tablet Commonly known as:  TYLENOL Take 500 mg by mouth every 6 (six) hours as needed.   albuterol 108 (90 Base) MCG/ACT inhaler Commonly known as:  PROVENTIL HFA;VENTOLIN HFA INHALE 2 PUFFS FOUR TIMES DAILY AS NEEDED.   b complex vitamins capsule Take 1 capsule by mouth daily.   buPROPion 150 MG 24 hr tablet Commonly known as:  WELLBUTRIN XL Take 150 mg by mouth daily.   clopidogrel 75 MG tablet Commonly known as:  PLAVIX TAKE (1) TABLET BY MOUTH ONCE DAILY.   clotrimazole 1 % cream Commonly known as:  LOTRIMIN Apply 1 application topically as needed.   docusate sodium 100 MG capsule Commonly known as:  COLACE Take 2 capsules (200 mg total) by mouth at bedtime.   doxepin 10 MG capsule Commonly known as:  SINEQUAN Take 1 capsule (10 mg total) by mouth 3 (three) times daily.   FLUoxetine 40 MG capsule Commonly known as:  PROZAC Take 40 mg by mouth daily.   furosemide 20 MG tablet Commonly known as:  LASIX Take 1 tablet (20 mg total) by mouth daily.   insulin aspart 100 UNIT/ML injection Commonly known as:  novoLOG Inject 60 Units into the skin 3 (three) times daily before meals. ro in am , 50 and lunc and 50 pm   ketoconazole 2 % cream Commonly known as:  NIZORAL Apply 1 application topically daily.   lisinopril 20 MG tablet Commonly known as:  PRINIVIL,ZESTRIL Take 1 tablet (20 mg total) by mouth daily.   LORazepam 0.5 MG tablet Commonly known as:  ATIVAN Take 0.5 mg by mouth. Takes 0.25mg  - 0.50mg , 1-2 time daily as needed.   medroxyPROGESTERone 10 MG tablet Commonly known as:  PROVERA Take 10 mg by mouth See admin instructions. Reported on 12/26/2015   nystatin ointment Commonly known as:  MYCOSTATIN Apply 1 application topically 3 (three) times daily as needed.   potassium chloride 10 MEQ tablet Commonly known as:  K-DUR Take 2 tablets (20 mEq total) by mouth daily.   pregabalin 225 MG  capsule Commonly known as:  LYRICA Take 1 capsule (225 mg total) by mouth 2 (two) times daily.   valACYclovir 500 MG tablet Commonly known as:  VALTREX Take 500 mg by mouth daily.   vitamin B-12 1000 MCG tablet Commonly known as:  CYANOCOBALAMIN Take 2,000 mcg by mouth daily.          Objective:    BP 121/61   Pulse 76   Temp 98.7 F (37.1 C) (Oral)   Ht 5\' 6"  (  1.676 m)   Wt (!) 349 lb (158.3 kg)   BMI 56.33 kg/m   Allergies  Allergen Reactions  . Augmentin [Amoxicillin-Pot Clavulanate] Nausea Only    Causes severe diarrhea  . Lopid [Gemfibrozil] Other (See Comments)    Burning tounge  . Naproxen Sodium Other (See Comments)    Patient states she felt loopy  . Morphine And Related Itching and Rash    Wt Readings from Last 3 Encounters:  05/29/18 (!) 349 lb (158.3 kg)  02/25/18 (!) 352 lb 3.2 oz (159.8 kg)  11/07/17 (!) 359 lb 9.6 oz (163.1 kg)    Physical Exam  Constitutional: She is oriented to person, place, and time. She appears well-developed and well-nourished.  HENT:  Head: Normocephalic and atraumatic.  Eyes: Pupils are equal, round, and reactive to light. Conjunctivae and EOM are normal.  Cardiovascular: Normal rate, regular rhythm, normal heart sounds and intact distal pulses.  Pulmonary/Chest: Effort normal and breath sounds normal.  Abdominal: Soft. Bowel sounds are normal.  Genitourinary:  Genitourinary Comments: Right labia is very swollen and larger than the left labia, there is fluctuant edema, there is no significant warmth, no drainage no skin disruption. area is irritated by friction  Neurological: She is alert and oriented to person, place, and time. She has normal reflexes.  Skin: Skin is warm and dry. No rash noted.  Psychiatric: She has a normal mood and affect. Her behavior is normal. Judgment and thought content normal.    Results for orders placed or performed in visit on 09/24/17  C-peptide  Result Value Ref Range   C-Peptide 1.8  1.1 - 4.4 ng/mL  Insulin, random  Result Value Ref Range   INSULIN 40.4 (H) 2.6 - 24.9 uIU/mL      Assessment & Plan:   1. Labia enlarged/edema Follow with Dr. Barrie Dunker  2. Labia irritation Follow with Dr. Barrie Dunker Will follow his direction  3. Essential hypertension Continue medications  4. Abdominal pannus Continue weight loss efforts  5. Left foot pain - DG Foot Complete Left; Future  6. Knee tendonitis Topical muscle cream and ice several times per day, reduce exercise until feeling better and then resume.   Continue all other maintenance medications as listed above.  Follow up plan: Return in about 6 months (around 11/28/2018), or if symptoms worsen or fail to improve, for recheck.  Educational handout given for [place] PA-C [facility] [address]  [place], [place] [address] [phone]   05/29/2018, 11:26 AM

## 2018-05-30 DIAGNOSIS — F329 Major depressive disorder, single episode, unspecified: Secondary | ICD-10-CM | POA: Diagnosis not present

## 2018-06-02 ENCOUNTER — Telehealth: Payer: Self-pay | Admitting: Physician Assistant

## 2018-06-02 NOTE — Telephone Encounter (Signed)
Patient aware.

## 2018-06-02 NOTE — Telephone Encounter (Signed)
If she is not taking an NSAID already, consider using Ibuprofen 600mg  q8 prn pain.  If symptoms persist, recommend recheck w/ PCP.  May need referral to sports medicine vs ortho.

## 2018-06-02 NOTE — Telephone Encounter (Signed)
Pt states that tendonitis is not getting better even when she was trying the ice, said she stopped doing that because it was making it worse. Wants to know what she needs to do now.

## 2018-06-20 ENCOUNTER — Telehealth: Payer: Self-pay | Admitting: Physician Assistant

## 2018-06-20 DIAGNOSIS — E1165 Type 2 diabetes mellitus with hyperglycemia: Secondary | ICD-10-CM | POA: Diagnosis not present

## 2018-06-20 NOTE — Telephone Encounter (Signed)
Pt has called states that her NP for mental health is afraid she isn't getting enough oxygen and this is causing some of her anxiety and depression wants to know if there is some type of mask that could be ordered for her CPAP machine

## 2018-06-23 DIAGNOSIS — F329 Major depressive disorder, single episode, unspecified: Secondary | ICD-10-CM | POA: Diagnosis not present

## 2018-06-23 NOTE — Telephone Encounter (Signed)
Is there a neurology or sleep specialist involved with this? I am versed in the types of masks available

## 2018-06-24 DIAGNOSIS — R1031 Right lower quadrant pain: Secondary | ICD-10-CM | POA: Diagnosis not present

## 2018-06-24 DIAGNOSIS — Z6841 Body Mass Index (BMI) 40.0 and over, adult: Secondary | ICD-10-CM | POA: Diagnosis not present

## 2018-06-24 NOTE — Telephone Encounter (Signed)
lmtcb

## 2018-06-25 NOTE — Telephone Encounter (Signed)
She will call neurologist and discuss her sleep apnea with him and hope he can help her get a new apparatus to use in her mouth with use of CPAP machine.

## 2018-07-07 DIAGNOSIS — G609 Hereditary and idiopathic neuropathy, unspecified: Secondary | ICD-10-CM | POA: Diagnosis not present

## 2018-07-07 DIAGNOSIS — E7800 Pure hypercholesterolemia, unspecified: Secondary | ICD-10-CM | POA: Diagnosis not present

## 2018-07-07 DIAGNOSIS — E1165 Type 2 diabetes mellitus with hyperglycemia: Secondary | ICD-10-CM | POA: Diagnosis not present

## 2018-07-07 DIAGNOSIS — I10 Essential (primary) hypertension: Secondary | ICD-10-CM | POA: Diagnosis not present

## 2018-07-14 DIAGNOSIS — F329 Major depressive disorder, single episode, unspecified: Secondary | ICD-10-CM | POA: Diagnosis not present

## 2018-07-21 DIAGNOSIS — F329 Major depressive disorder, single episode, unspecified: Secondary | ICD-10-CM | POA: Diagnosis not present

## 2018-07-21 DIAGNOSIS — E1165 Type 2 diabetes mellitus with hyperglycemia: Secondary | ICD-10-CM | POA: Diagnosis not present

## 2018-08-06 DIAGNOSIS — N393 Stress incontinence (female) (male): Secondary | ICD-10-CM | POA: Diagnosis not present

## 2018-08-18 DIAGNOSIS — F329 Major depressive disorder, single episode, unspecified: Secondary | ICD-10-CM | POA: Diagnosis not present

## 2018-08-21 DIAGNOSIS — E1165 Type 2 diabetes mellitus with hyperglycemia: Secondary | ICD-10-CM | POA: Diagnosis not present

## 2018-08-25 DIAGNOSIS — F329 Major depressive disorder, single episode, unspecified: Secondary | ICD-10-CM | POA: Diagnosis not present

## 2018-09-01 DIAGNOSIS — F329 Major depressive disorder, single episode, unspecified: Secondary | ICD-10-CM | POA: Diagnosis not present

## 2018-09-15 DIAGNOSIS — F329 Major depressive disorder, single episode, unspecified: Secondary | ICD-10-CM | POA: Diagnosis not present

## 2018-09-17 DIAGNOSIS — N393 Stress incontinence (female) (male): Secondary | ICD-10-CM | POA: Diagnosis not present

## 2018-09-19 DIAGNOSIS — F329 Major depressive disorder, single episode, unspecified: Secondary | ICD-10-CM | POA: Diagnosis not present

## 2018-09-22 DIAGNOSIS — E1165 Type 2 diabetes mellitus with hyperglycemia: Secondary | ICD-10-CM | POA: Diagnosis not present

## 2018-10-20 DIAGNOSIS — E1165 Type 2 diabetes mellitus with hyperglycemia: Secondary | ICD-10-CM | POA: Diagnosis not present

## 2018-10-24 DIAGNOSIS — F329 Major depressive disorder, single episode, unspecified: Secondary | ICD-10-CM | POA: Diagnosis not present

## 2018-11-03 DIAGNOSIS — F329 Major depressive disorder, single episode, unspecified: Secondary | ICD-10-CM | POA: Diagnosis not present

## 2018-11-17 DIAGNOSIS — F329 Major depressive disorder, single episode, unspecified: Secondary | ICD-10-CM | POA: Diagnosis not present

## 2018-11-19 DIAGNOSIS — E1165 Type 2 diabetes mellitus with hyperglycemia: Secondary | ICD-10-CM | POA: Diagnosis not present

## 2018-11-24 DIAGNOSIS — F329 Major depressive disorder, single episode, unspecified: Secondary | ICD-10-CM | POA: Diagnosis not present

## 2018-12-01 ENCOUNTER — Ambulatory Visit (INDEPENDENT_AMBULATORY_CARE_PROVIDER_SITE_OTHER): Payer: BLUE CROSS/BLUE SHIELD | Admitting: Physician Assistant

## 2018-12-01 ENCOUNTER — Other Ambulatory Visit: Payer: Self-pay

## 2018-12-01 DIAGNOSIS — I10 Essential (primary) hypertension: Secondary | ICD-10-CM | POA: Diagnosis not present

## 2018-12-01 DIAGNOSIS — N906 Unspecified hypertrophy of vulva: Secondary | ICD-10-CM | POA: Diagnosis not present

## 2018-12-01 DIAGNOSIS — E1142 Type 2 diabetes mellitus with diabetic polyneuropathy: Secondary | ICD-10-CM

## 2018-12-01 DIAGNOSIS — F4310 Post-traumatic stress disorder, unspecified: Secondary | ICD-10-CM

## 2018-12-01 DIAGNOSIS — F329 Major depressive disorder, single episode, unspecified: Secondary | ICD-10-CM | POA: Diagnosis not present

## 2018-12-01 DIAGNOSIS — F331 Major depressive disorder, recurrent, moderate: Secondary | ICD-10-CM

## 2018-12-01 MED ORDER — AMLODIPINE BESYLATE 5 MG PO TABS: 5.0000 mg | ORAL_TABLET | Freq: Every day | ORAL | 1 refills | Status: DC

## 2018-12-01 MED ORDER — FUROSEMIDE 20 MG PO TABS: 20.0000 mg | ORAL_TABLET | Freq: Every day | ORAL | 6 refills | Status: DC

## 2018-12-01 MED ORDER — PREGABALIN 225 MG PO CAPS: 225.0000 mg | ORAL_CAPSULE | Freq: Two times a day (BID) | ORAL | 5 refills | Status: DC

## 2018-12-01 NOTE — Progress Notes (Deleted)
. .   There were no vitals taken for this visit.   Subjective:    Patient ID: Kathryn Jimenez, female    DOB: 03-Apr-1971, 48 y.o.   MRN: [id number]  HPI: Kathryn Jimenez is a 48 y.o. female presenting on 12/01/2018 for No chief complaint on file.    Past Medical History:  Diagnosis Date  . Allergy   . Anginal pain ([place])   . Anxiety   . Arthritis   . Asthma   . Bladder disorder, other   . Borderline personality disorder ([place])   . Carotid artery disease ([place]) 03/05/2017  . Depression   . Diabetes mellitus without complication ([place])   . GERD (gastroesophageal reflux disease)   . Headache   . Hyperlipidemia   . Hypertension   . Kidney stones   . Neuromuscular disorder (HCC)    Neuropathy  . PTSD (post-traumatic stress disorder)   . Shortness of breath   . Sleep apnea   . Stroke [facility])    Relevant past medical, surgical, family and social history reviewed and updated as indicated. Interim medical history since our last visit reviewed. Allergies and medications reviewed and updated. DATA REVIEWED: CHART IN EPIC  Family History reviewed for pertinent findings.  Review of Systems  Allergies as of 12/01/2018      Reactions   Augmentin [amoxicillin-pot Clavulanate] Nausea Only   Causes severe diarrhea   Lopid [gemfibrozil] Other (See Comments)   Burning tounge   Naproxen Sodium Other (See Comments)   Patient states she felt loopy   Morphine And Related Itching, Rash      Medication List       Accurate as of [place] 13, 2020 11:59 PM. Always use your most recent med list.        acetaminophen 500 MG tablet Commonly known as:  TYLENOL Take 500 mg by mouth every 6 (six) hours as needed.   albuterol 108 (90 Base) MCG/ACT inhaler Commonly known as:  ProAir HFA INHALE 2 PUFFS FOUR TIMES DAILY AS NEEDED.   amLODipine 5 MG tablet Commonly known as:  NORVASC Take 1 tablet (5 mg total) by mouth daily.   b complex vitamins capsule Take 1 capsule by mouth daily.    buPROPion 150 MG 24 hr tablet Commonly known as:  WELLBUTRIN XL Take 150 mg by mouth daily.   clopidogrel 75 MG tablet Commonly known as:  PLAVIX TAKE (1) TABLET BY MOUTH ONCE DAILY.   clotrimazole 1 % cream Commonly known as:  LOTRIMIN Apply 1 application topically as needed.   docusate sodium 100 MG capsule Commonly known as:  Colace Take 2 capsules (200 mg total) by mouth at bedtime.   doxepin 10 MG capsule Commonly known as:  SINEQUAN Take 1 capsule (10 mg total) by mouth 3 (three) times daily.   FLUoxetine 40 MG capsule Commonly known as:  PROZAC Take 40 mg by mouth daily.   furosemide 20 MG tablet Commonly known as:  LASIX Take 1 tablet (20 mg total) by mouth daily.   insulin aspart 100 UNIT/ML injection Commonly known as:  novoLOG Inject 60 Units into the skin 3 (three) times daily before meals. ro in am , 50 and lunc and 50 pm   ketoconazole 2 % cream Commonly known as:  NIZORAL Apply 1 application topically daily.   LORazepam 0.5 MG tablet Commonly known as:  ATIVAN Take 0.5 mg by mouth. Takes 0.25mg  - 0.50mg , 1-2 time daily as needed.   medroxyPROGESTERone 10 MG tablet Commonly  known as:  PROVERA Take 10 mg by mouth See admin instructions. Reported on 12/26/2015   nystatin ointment Commonly known as:  MYCOSTATIN Apply 1 application topically 3 (three) times daily as needed.   potassium chloride 10 MEQ tablet Commonly known as:  K-DUR Take 2 tablets (20 mEq total) by mouth daily.   pregabalin 225 MG capsule Commonly known as:  LYRICA Take 1 capsule (225 mg total) by mouth 2 (two) times daily.   valACYclovir 500 MG tablet Commonly known as:  VALTREX Take 500 mg by mouth daily.   vitamin B-12 1000 MCG tablet Commonly known as:  CYANOCOBALAMIN Take 2,000 mcg by mouth daily.          Objective:    There were no vitals taken for this visit.  Allergies  Allergen Reactions  . Augmentin [Amoxicillin-Pot Clavulanate] Nausea Only    Causes  severe diarrhea  . Lopid [Gemfibrozil] Other (See Comments)    Burning tounge  . Naproxen Sodium Other (See Comments)    Patient states she felt loopy  . Morphine And Related Itching and Rash    Wt Readings from Last 3 Encounters:  05/29/18 (!) 349 lb (158.3 kg)  02/25/18 (!) 352 lb 3.2 oz (159.8 kg)  11/07/17 (!) 359 lb 9.6 oz (163.1 kg)    Physical Exam  Results for orders placed or performed in visit on 09/24/17  C-peptide  Result Value Ref Range   C-Peptide 1.8 1.1 - 4.4 ng/mL  Insulin, random  Result Value Ref Range   INSULIN 40.4 (H) 2.6 - 24.9 uIU/mL      Assessment & Plan:   1. Labia enlarged Continue weight loss efforts  Keep covered with cotton towels Follow with GYNECOLOGY  2. Morbid obesity ([place]) Down 60 pounds in past few years, has maintained the loss, continuing to work on it.  3. Essential hypertension - amLODipine (NORVASC) 5 MG tablet; Take 1 tablet (5 mg total) by mouth daily.  Dispense: 90 tablet; Refill: 1 - furosemide (LASIX) 20 MG tablet; Take 1 tablet (20 mg total) by mouth daily.  Dispense: 30 tablet; Refill: 6  4. PTSD (post-traumatic stress disorder) Continue telephone visits and counseling  5. Moderate episode of recurrent major depressive disorder ([name]) Continue maintenance medications given by psychiatry  6. Diabetic polyneuropathy associated with type 2 diabetes mellitus (HCC) - pregabalin (LYRICA) 225 MG capsule; Take 1 capsule (225 mg total) by mouth 2 (two) times daily.  Dispense: 60 capsule; Refill: 5   Continue all other maintenance medications as listed above.  Follow up plan: Return in about 3 months (around 03/02/2019).  Educational handout given for [place] PA-C [facility] [address]  [place], [place] [address] [phone]   12/02/2018, 10:05 AM  .

## 2018-12-02 ENCOUNTER — Encounter: Payer: Self-pay | Admitting: Physician Assistant

## 2018-12-02 NOTE — Progress Notes (Signed)
Telephone visit  Subjective: CC: Depression, diabetes, neuropathy, hypertension PCP: Terald Sleeper, PA-C NAT:FTDDU Cech is a 48 y.o. female calls for telephone consult today. Patient provides verbal consent for consult held via phone.  Patient is identified with 2 separate identifiers.  At this time the entire area is on COVID-19 social distancing and stay home orders are in place.  Patient is of higher risk and therefore we are performing this by a virtual method.  Location of patient: Home Location of provider: [facility] Others present for call: No  This is a periodic recheck on the patient and her multiple health conditions.  She is still dealing with her enlarged labia that is quite irritated.  She was seen by Dr. Barrie Dunker.  They are still working on ways to control her your hormones the in menstrual cycle.  He wants her to continue to work on weight loss.  They did even mention bariatric surgery as an option.  She does not want to pursue this at this time  Overall she has had fairly well-controlled blood pressure and neuropathy in her feet.  She does need some refills on her medications.  She is still seeing Dr. Soyla Murphy as her endocrinologist.  She is still with [facility] services for her depression and PTSD.  She is doing telephone or video conferencing with them.  ROS: Per HPI  Allergies  Allergen Reactions  . Augmentin [Amoxicillin-Pot Clavulanate] Nausea Only    Causes severe diarrhea  . Lopid [Gemfibrozil] Other (See Comments)    Burning tounge  . Naproxen Sodium Other (See Comments)    Patient states she felt loopy  . Morphine And Related Itching and Rash   Past Medical History:  Diagnosis Date  . Allergy   . Anginal pain ([place])   . Anxiety   . Arthritis   . Asthma   . Bladder disorder, other   . Borderline personality disorder ([place])   . Carotid artery disease ([place]) 03/05/2017  . Depression   . Diabetes mellitus without complication ([place])   . GERD (gastroesophageal  reflux disease)   . Headache   . Hyperlipidemia   . Hypertension   . Kidney stones   . Neuromuscular disorder (HCC)    Neuropathy  . PTSD (post-traumatic stress disorder)   . Shortness of breath   . Sleep apnea   . Stroke [facility])     Current Outpatient Medications:  .  acetaminophen (TYLENOL) 500 MG tablet, Take 500 mg by mouth every 6 (six) hours as needed., Disp: , Rfl:  .  albuterol (PROAIR HFA) 108 (90 Base) MCG/ACT inhaler, INHALE 2 PUFFS FOUR TIMES DAILY AS NEEDED., Disp: 8.5 g, Rfl: 5 .  amLODipine (NORVASC) 5 MG tablet, Take 1 tablet (5 mg total) by mouth daily., Disp: 90 tablet, Rfl: 1 .  b complex vitamins capsule, Take 1 capsule by mouth daily., Disp: , Rfl:  .  buPROPion (WELLBUTRIN XL) 150 MG 24 hr tablet, Take 150 mg by mouth daily., Disp: , Rfl:  .  clopidogrel (PLAVIX) 75 MG tablet, TAKE (1) TABLET BY MOUTH ONCE DAILY., Disp: 30 tablet, Rfl: 11 .  clotrimazole (LOTRIMIN) 1 % cream, Apply 1 application topically as needed. , Disp: , Rfl:  .  docusate sodium (COLACE) 100 MG capsule, Take 2 capsules (200 mg total) by mouth at bedtime. (Patient taking differently: Take 200 mg by mouth as needed. ), Disp: , Rfl: 0 .  doxepin (SINEQUAN) 10 MG capsule, Take 1 capsule (10 mg total) by mouth  3 (three) times daily. (Patient taking differently: Take 10 mg by mouth 2 (two) times daily. ), Disp: 90 capsule, Rfl: 5 .  FLUoxetine (PROZAC) 40 MG capsule, Take 40 mg by mouth daily., Disp: , Rfl:  .  furosemide (LASIX) 20 MG tablet, Take 1 tablet (20 mg total) by mouth daily., Disp: 30 tablet, Rfl: 6 .  insulin aspart (NOVOLOG) 100 UNIT/ML injection, Inject 60 Units into the skin 3 (three) times daily before meals. ro in am , 50 and lunc and 50 pm, Disp: , Rfl:  .  ketoconazole (NIZORAL) 2 % cream, Apply 1 application topically daily., Disp: 30 g, Rfl: 0 .  LORazepam (ATIVAN) 0.5 MG tablet, Take 0.5 mg by mouth. Takes 0.25mg  - 0.50mg , 1-2 time daily as needed., Disp: , Rfl:  .   medroxyPROGESTERone (PROVERA) 10 MG tablet, Take 10 mg by mouth See admin instructions. Reported on 12/26/2015, Disp: , Rfl:  .  nystatin ointment (MYCOSTATIN), Apply 1 application topically 3 (three) times daily as needed., Disp: , Rfl:  .  potassium chloride (K-DUR) 10 MEQ tablet, Take 2 tablets (20 mEq total) by mouth daily., Disp: 90 tablet, Rfl: 3 .  pregabalin (LYRICA) 225 MG capsule, Take 1 capsule (225 mg total) by mouth 2 (two) times daily., Disp: 60 capsule, Rfl: 5 .  valACYclovir (VALTREX) 500 MG tablet, Take 500 mg by mouth daily., Disp: , Rfl:  .  vitamin B-12 (CYANOCOBALAMIN) 1000 MCG tablet, Take 2,000 mcg by mouth daily. , Disp: , Rfl:   Assessment/ Plan: 48 y.o. female   1. Labia enlarged Continue weight loss efforts  Keep covered with cotton towels Follow with GYNECOLOGY  2. Morbid obesity ([place]) Down 60 pounds in past few years, has maintained the loss, continuing to work on it.  3. Essential hypertension - amLODipine (NORVASC) 5 MG tablet; Take 1 tablet (5 mg total) by mouth daily.  Dispense: 90 tablet; Refill: 1 - furosemide (LASIX) 20 MG tablet; Take 1 tablet (20 mg total) by mouth daily.  Dispense: 30 tablet; Refill: 6  4. PTSD (post-traumatic stress disorder) Continue telephone visits and counseling   Start time: 10:38 AM End time: 10:33 AM  Meds ordered this encounter  Medications  . amLODipine (NORVASC) 5 MG tablet    Sig: Take 1 tablet (5 mg total) by mouth daily.    Dispense:  90 tablet    Refill:  1    Order Specific Question:   Supervising Provider    Answer:   Janora Norlander [[id number]]  . pregabalin (LYRICA) 225 MG capsule    Sig: Take 1 capsule (225 mg total) by mouth 2 (two) times daily.    Dispense:  60 capsule    Refill:  5    Order Specific Question:   Supervising Provider    Answer:   Janora Norlander [[id number]]  . furosemide (LASIX) 20 MG tablet    Sig: Take 1 tablet (20 mg total) by mouth daily.    Dispense:  30 tablet    Refill:   6    Order Specific Question:   Supervising Provider    Answer:   Janora Norlander [[id number]]    [name] PA-C [place] [phone]

## 2018-12-15 DIAGNOSIS — F329 Major depressive disorder, single episode, unspecified: Secondary | ICD-10-CM | POA: Diagnosis not present

## 2018-12-16 DIAGNOSIS — Z713 Dietary counseling and surveillance: Secondary | ICD-10-CM | POA: Diagnosis not present

## 2018-12-16 DIAGNOSIS — B369 Superficial mycosis, unspecified: Secondary | ICD-10-CM | POA: Diagnosis not present

## 2018-12-16 DIAGNOSIS — E282 Polycystic ovarian syndrome: Secondary | ICD-10-CM | POA: Diagnosis not present

## 2018-12-16 DIAGNOSIS — N939 Abnormal uterine and vaginal bleeding, unspecified: Secondary | ICD-10-CM | POA: Diagnosis not present

## 2018-12-19 DIAGNOSIS — E1165 Type 2 diabetes mellitus with hyperglycemia: Secondary | ICD-10-CM | POA: Diagnosis not present

## 2018-12-22 DIAGNOSIS — F329 Major depressive disorder, single episode, unspecified: Secondary | ICD-10-CM | POA: Diagnosis not present

## 2018-12-29 DIAGNOSIS — F329 Major depressive disorder, single episode, unspecified: Secondary | ICD-10-CM | POA: Diagnosis not present

## 2019-01-01 ENCOUNTER — Other Ambulatory Visit: Payer: Self-pay

## 2019-01-01 ENCOUNTER — Other Ambulatory Visit: Payer: BLUE CROSS/BLUE SHIELD

## 2019-01-01 DIAGNOSIS — E7800 Pure hypercholesterolemia, unspecified: Secondary | ICD-10-CM | POA: Diagnosis not present

## 2019-01-01 DIAGNOSIS — E669 Obesity, unspecified: Secondary | ICD-10-CM | POA: Diagnosis not present

## 2019-01-01 DIAGNOSIS — E1165 Type 2 diabetes mellitus with hyperglycemia: Secondary | ICD-10-CM | POA: Diagnosis not present

## 2019-01-05 DIAGNOSIS — F329 Major depressive disorder, single episode, unspecified: Secondary | ICD-10-CM | POA: Diagnosis not present

## 2019-01-05 DIAGNOSIS — G609 Hereditary and idiopathic neuropathy, unspecified: Secondary | ICD-10-CM | POA: Diagnosis not present

## 2019-01-05 DIAGNOSIS — I10 Essential (primary) hypertension: Secondary | ICD-10-CM | POA: Diagnosis not present

## 2019-01-05 DIAGNOSIS — E1165 Type 2 diabetes mellitus with hyperglycemia: Secondary | ICD-10-CM | POA: Diagnosis not present

## 2019-01-05 DIAGNOSIS — E7800 Pure hypercholesterolemia, unspecified: Secondary | ICD-10-CM | POA: Diagnosis not present

## 2019-01-09 DIAGNOSIS — F329 Major depressive disorder, single episode, unspecified: Secondary | ICD-10-CM | POA: Diagnosis not present

## 2019-01-19 DIAGNOSIS — F329 Major depressive disorder, single episode, unspecified: Secondary | ICD-10-CM | POA: Diagnosis not present

## 2019-01-19 DIAGNOSIS — E109 Type 1 diabetes mellitus without complications: Secondary | ICD-10-CM | POA: Diagnosis not present

## 2019-02-02 DIAGNOSIS — F329 Major depressive disorder, single episode, unspecified: Secondary | ICD-10-CM | POA: Diagnosis not present

## 2019-02-05 ENCOUNTER — Other Ambulatory Visit: Payer: Self-pay | Admitting: Physician Assistant

## 2019-02-05 MED ORDER — DOXEPIN HCL 10 MG PO CAPS: 10.0000 mg | ORAL_CAPSULE | Freq: Three times a day (TID) | ORAL | 5 refills | Status: DC

## 2019-02-05 NOTE — Telephone Encounter (Signed)
What is the name of the medication? doxepin (SINEQUAN) 10 MG capsule    Have you contacted your pharmacy to request a refill?yes  Which pharmacy would you like this sent to? [facility] she is completely out   Patient notified that their request is being sent to the clinical staff for review and that they should receive a call once it is complete. If they do not receive a call within 24 hours they can check with their pharmacy or our office.

## 2019-02-09 DIAGNOSIS — F329 Major depressive disorder, single episode, unspecified: Secondary | ICD-10-CM | POA: Diagnosis not present

## 2019-02-16 DIAGNOSIS — F329 Major depressive disorder, single episode, unspecified: Secondary | ICD-10-CM | POA: Diagnosis not present

## 2019-02-23 DIAGNOSIS — F329 Major depressive disorder, single episode, unspecified: Secondary | ICD-10-CM | POA: Diagnosis not present

## 2019-02-24 DIAGNOSIS — E119 Type 2 diabetes mellitus without complications: Secondary | ICD-10-CM | POA: Diagnosis not present

## 2019-02-24 LAB — HM DIABETES EYE EXAM

## 2019-03-02 DIAGNOSIS — F329 Major depressive disorder, single episode, unspecified: Secondary | ICD-10-CM | POA: Diagnosis not present

## 2019-03-04 ENCOUNTER — Ambulatory Visit: Payer: BLUE CROSS/BLUE SHIELD | Admitting: Physician Assistant

## 2019-03-05 DIAGNOSIS — E109 Type 1 diabetes mellitus without complications: Secondary | ICD-10-CM | POA: Diagnosis not present

## 2019-03-05 DIAGNOSIS — I10 Essential (primary) hypertension: Secondary | ICD-10-CM | POA: Diagnosis not present

## 2019-03-05 DIAGNOSIS — F4310 Post-traumatic stress disorder, unspecified: Secondary | ICD-10-CM | POA: Diagnosis not present

## 2019-03-05 DIAGNOSIS — E119 Type 2 diabetes mellitus without complications: Secondary | ICD-10-CM | POA: Diagnosis not present

## 2019-03-09 DIAGNOSIS — F329 Major depressive disorder, single episode, unspecified: Secondary | ICD-10-CM | POA: Diagnosis not present

## 2019-03-10 DIAGNOSIS — I739 Peripheral vascular disease, unspecified: Secondary | ICD-10-CM | POA: Diagnosis not present

## 2019-03-10 DIAGNOSIS — L110 Acquired keratosis follicularis: Secondary | ICD-10-CM | POA: Diagnosis not present

## 2019-03-10 DIAGNOSIS — M79671 Pain in right foot: Secondary | ICD-10-CM | POA: Diagnosis not present

## 2019-03-10 DIAGNOSIS — E1140 Type 2 diabetes mellitus with diabetic neuropathy, unspecified: Secondary | ICD-10-CM | POA: Diagnosis not present

## 2019-03-12 ENCOUNTER — Telehealth: Payer: Self-pay | Admitting: Physician Assistant

## 2019-03-16 ENCOUNTER — Other Ambulatory Visit: Payer: Self-pay

## 2019-03-16 DIAGNOSIS — F329 Major depressive disorder, single episode, unspecified: Secondary | ICD-10-CM | POA: Diagnosis not present

## 2019-03-17 ENCOUNTER — Encounter: Payer: Self-pay | Admitting: Physician Assistant

## 2019-03-17 ENCOUNTER — Ambulatory Visit (INDEPENDENT_AMBULATORY_CARE_PROVIDER_SITE_OTHER): Payer: BC Managed Care – PPO | Admitting: Physician Assistant

## 2019-03-17 DIAGNOSIS — E65 Localized adiposity: Secondary | ICD-10-CM | POA: Diagnosis not present

## 2019-03-17 DIAGNOSIS — I10 Essential (primary) hypertension: Secondary | ICD-10-CM

## 2019-03-17 DIAGNOSIS — E1142 Type 2 diabetes mellitus with diabetic polyneuropathy: Secondary | ICD-10-CM

## 2019-03-17 DIAGNOSIS — M7989 Other specified soft tissue disorders: Secondary | ICD-10-CM

## 2019-03-17 DIAGNOSIS — Z794 Long term (current) use of insulin: Secondary | ICD-10-CM

## 2019-03-17 MED ORDER — CLOPIDOGREL BISULFATE 75 MG PO TABS: ORAL_TABLET | ORAL | 11 refills | Status: DC

## 2019-03-17 MED ORDER — DOXEPIN HCL 10 MG PO CAPS: 10.0000 mg | ORAL_CAPSULE | Freq: Three times a day (TID) | ORAL | 5 refills | Status: DC

## 2019-03-17 MED ORDER — POTASSIUM CHLORIDE ER 10 MEQ PO TBCR: 20.0000 meq | EXTENDED_RELEASE_TABLET | Freq: Every day | ORAL | 3 refills | Status: DC

## 2019-03-17 MED ORDER — AMLODIPINE BESYLATE 5 MG PO TABS: 5.0000 mg | ORAL_TABLET | Freq: Every day | ORAL | 1 refills | Status: DC

## 2019-03-17 NOTE — Patient Instructions (Signed)
[facility] [facility]  [place]

## 2019-03-19 NOTE — Progress Notes (Signed)
BP 126/65   Pulse 79   Temp 99.3 F (37.4 C) (Oral)   Ht 5\' 6"  (1.676 m)   Wt (!) 350 lb 3.2 oz (158.8 kg)   BMI 56.52 kg/m    Subjective:    Patient ID: Kathryn Jimenez, female    DOB: 1970-10-05, 48 y.o.   MRN: [id number]  HPI: Kathryn Jimenez is a 48 y.o. female presenting on 03/17/2019 for Diabetes and Medical Management of Chronic Issues  Patient comes in for 41-month chronic recheck on her medical conditions.  They do include diabetes, obesity, hypertension, abdominal pannus.  She has still continued to see her gynecologist, and psychiatrist.  All of her medications are brought up-to-date.  She is also had some issues with extremely dry.  She they have had a long discussion about bariatric treatment.  She will have to have that surgery in order to have her abdominal pannus worked on.  She has continued to do pretty well and losing weight.  Labs will be performed today.  She had been referred to the [facility] health system for bariatric evaluation.  She is only had one tele-visit.  And is going to have to go to [facility] to have a lot of the procedures performed to be approved.  We have discussed that there are some closer groups that perform this and that we can refer her if she needs to.  I have given her the names and she will do some research and let us know.  Past Medical History:  Diagnosis Date  . Allergy   . Anginal pain ([place])   . Anxiety   . Arthritis   . Asthma   . Bladder disorder, other   . Borderline personality disorder ([place])   . Carotid artery disease ([place]) 03/05/2017  . Depression   . GERD (gastroesophageal reflux disease)   . Headache   . Hyperlipidemia   . Hypertension   . Kidney stones   . Neuromuscular disorder (HCC)    Neuropathy  . PTSD (post-traumatic stress disorder)   . Shortness of breath   . Sleep apnea   . Stroke [facility])    Relevant past medical, surgical, family and social history reviewed and updated as indicated. Interim medical history since our last  visit reviewed. Allergies and medications reviewed and updated. DATA REVIEWED: CHART IN EPIC  Family History reviewed for pertinent findings.  Review of Systems  Constitutional: Positive for fatigue.  HENT: Negative.   Eyes: Negative.   Respiratory: Negative.  Negative for shortness of breath and wheezing.   Cardiovascular: Positive for leg swelling.  Gastrointestinal: Positive for abdominal pain.  Genitourinary: Negative.   Musculoskeletal: Positive for joint swelling.    Allergies as of 03/17/2019      Reactions   Augmentin [amoxicillin-pot Clavulanate] Nausea Only   Causes severe diarrhea   Invokana [canagliflozin]    Lopid [gemfibrozil] Other (See Comments)   Burning tounge   Naproxen Sodium Other (See Comments)   Patient states she felt loopy   Morphine And Related Itching, Rash      Medication List       Accurate as of March 17, 2019 11:59 PM. If you have any questions, ask your nurse or doctor.        acetaminophen 500 MG tablet Commonly known as: TYLENOL Take 500 mg by mouth every 6 (six) hours as needed.   albuterol 108 (90 Base) MCG/ACT inhaler Commonly known as: ProAir HFA INHALE 2 PUFFS FOUR TIMES DAILY AS NEEDED.  amLODipine 5 MG tablet Commonly known as: NORVASC Take 1 tablet (5 mg total) by mouth daily.   b complex vitamins capsule Take 1 capsule by mouth daily.   buPROPion 150 MG 24 hr tablet Commonly known as: WELLBUTRIN XL Take 150 mg by mouth daily.   clopidogrel 75 MG tablet Commonly known as: PLAVIX TAKE (1) TABLET BY MOUTH ONCE DAILY.   clotrimazole 1 % cream Commonly known as: LOTRIMIN Apply 1 application topically as needed.   docusate sodium 100 MG capsule Commonly known as: Colace Take 2 capsules (200 mg total) by mouth at bedtime. What changed:   when to take this  reasons to take this   doxepin 10 MG capsule Commonly known as: SINEQUAN Take 1 capsule (10 mg total) by mouth 3 (three) times daily.   FLUoxetine 40 MG  capsule Commonly known as: PROZAC Take 40 mg by mouth daily.   furosemide 20 MG tablet Commonly known as: LASIX Take 1 tablet (20 mg total) by mouth daily.   insulin aspart 100 UNIT/ML injection Commonly known as: novoLOG Inject 60 Units into the skin 3 (three) times daily before meals. ro in am , 50 and lunc and 50 pm   ketoconazole 2 % cream Commonly known as: NIZORAL Apply 1 application topically daily.   LORazepam 0.5 MG tablet Commonly known as: ATIVAN Take 0.5 mg by mouth. Takes 0.25mg  - 0.50mg , 1-2 time daily as needed.   medroxyPROGESTERone 10 MG tablet Commonly known as: PROVERA Take 10 mg by mouth See admin instructions. Reported on 12/26/2015   nystatin ointment Commonly known as: MYCOSTATIN Apply 1 application topically 3 (three) times daily as needed.   potassium chloride 10 MEQ tablet Commonly known as: K-DUR Take 2 tablets (20 mEq total) by mouth daily.   prednisoLONE acetate 1 % ophthalmic suspension Commonly known as: PRED FORTE 1 drop Two (2) times a day.   pregabalin 225 MG capsule Commonly known as: LYRICA Take 1 capsule (225 mg total) by mouth 2 (two) times daily.   valACYclovir 500 MG tablet Commonly known as: VALTREX Take 500 mg by mouth daily.   vitamin B-12 1000 MCG tablet Commonly known as: CYANOCOBALAMIN Take 2,000 mcg by mouth daily.          Objective:    BP 126/65   Pulse 79   Temp 99.3 F (37.4 C) (Oral)   Ht 5\' 6"  (1.676 m)   Wt (!) 350 lb 3.2 oz (158.8 kg)   BMI 56.52 kg/m   Allergies  Allergen Reactions  . Augmentin [Amoxicillin-Pot Clavulanate] Nausea Only    Causes severe diarrhea  . Invokana [Canagliflozin]   . Lopid [Gemfibrozil] Other (See Comments)    Burning tounge  . Naproxen Sodium Other (See Comments)    Patient states she felt loopy  . Morphine And Related Itching and Rash    Wt Readings from Last 3 Encounters:  03/17/19 (!) 350 lb 3.2 oz (158.8 kg)  05/29/18 (!) 349 lb (158.3 kg)  02/25/18 (!) 352  lb 3.2 oz (159.8 kg)    Physical Exam Constitutional:      Appearance: She is well-developed.  HENT:     Head: Normocephalic and atraumatic.  Eyes:     Conjunctiva/sclera: Conjunctivae normal.     Pupils: Pupils are equal, round, and reactive to light.  Cardiovascular:     Rate and Rhythm: Normal rate and regular rhythm.     Heart sounds: Normal heart sounds.  Pulmonary:     Effort: Pulmonary  effort is normal.     Breath sounds: Normal breath sounds.  Abdominal:     General: Bowel sounds are normal.     Palpations: Abdomen is soft.  Skin:    General: Skin is warm and dry.     Findings: No rash.  Neurological:     Mental Status: She is alert and oriented to person, place, and time.     Deep Tendon Reflexes: Reflexes are normal and symmetric.  Psychiatric:        Behavior: Behavior normal.        Thought Content: Thought content normal.        Judgment: Judgment normal.     Results for orders placed or performed in visit on 09/24/17  C-peptide  Result Value Ref Range   C-Peptide 1.8 1.1 - 4.4 ng/mL  Insulin, random  Result Value Ref Range   INSULIN 40.4 (H) 2.6 - 24.9 uIU/mL      Assessment & Plan:   1. Essential hypertension - amLODipine (NORVASC) 5 MG tablet; Take 1 tablet (5 mg total) by mouth daily.  Dispense: 90 tablet; Refill: 1  2. Morbid obesity ([place]) Continue with bariatric evaluation We can refer her to [place], [place] or [place].  She will let us know.  3. Abdominal pannus Continue weight loss efforts  4. Leg swelling Continue weight loss efforts and medications  5. Type 2 diabetes mellitus with diabetic polyneuropathy, with long-term current use of insulin (HCC) Continue medications   Continue all other maintenance medications as listed above.  Follow up plan: Return in about 1 year (around 03/16/2020).  Educational handout given for [place] PA-C [facility] [address]  [place], [place] [address] [phone]   03/19/2019, 3:33 PM

## 2019-03-24 DIAGNOSIS — H04123 Dry eye syndrome of bilateral lacrimal glands: Secondary | ICD-10-CM | POA: Diagnosis not present

## 2019-03-30 DIAGNOSIS — F329 Major depressive disorder, single episode, unspecified: Secondary | ICD-10-CM | POA: Diagnosis not present

## 2019-04-13 DIAGNOSIS — F329 Major depressive disorder, single episode, unspecified: Secondary | ICD-10-CM | POA: Diagnosis not present

## 2019-05-01 DIAGNOSIS — F329 Major depressive disorder, single episode, unspecified: Secondary | ICD-10-CM | POA: Diagnosis not present

## 2019-05-12 DIAGNOSIS — I10 Essential (primary) hypertension: Secondary | ICD-10-CM | POA: Diagnosis not present

## 2019-05-12 DIAGNOSIS — E669 Obesity, unspecified: Secondary | ICD-10-CM | POA: Diagnosis not present

## 2019-05-12 DIAGNOSIS — G609 Hereditary and idiopathic neuropathy, unspecified: Secondary | ICD-10-CM | POA: Diagnosis not present

## 2019-05-12 DIAGNOSIS — E1165 Type 2 diabetes mellitus with hyperglycemia: Secondary | ICD-10-CM | POA: Diagnosis not present

## 2019-05-12 DIAGNOSIS — E7800 Pure hypercholesterolemia, unspecified: Secondary | ICD-10-CM | POA: Diagnosis not present

## 2019-05-20 DIAGNOSIS — E109 Type 1 diabetes mellitus without complications: Secondary | ICD-10-CM | POA: Diagnosis not present

## 2019-06-16 ENCOUNTER — Encounter (INDEPENDENT_AMBULATORY_CARE_PROVIDER_SITE_OTHER): Payer: Self-pay | Admitting: *Deleted

## 2019-06-19 DIAGNOSIS — H40013 Open angle with borderline findings, low risk, bilateral: Secondary | ICD-10-CM | POA: Diagnosis not present

## 2019-07-07 DIAGNOSIS — N939 Abnormal uterine and vaginal bleeding, unspecified: Secondary | ICD-10-CM | POA: Diagnosis not present

## 2019-07-07 DIAGNOSIS — E282 Polycystic ovarian syndrome: Secondary | ICD-10-CM | POA: Diagnosis not present

## 2019-07-07 DIAGNOSIS — Z6841 Body Mass Index (BMI) 40.0 and over, adult: Secondary | ICD-10-CM | POA: Diagnosis not present

## 2019-07-10 DIAGNOSIS — E119 Type 2 diabetes mellitus without complications: Secondary | ICD-10-CM | POA: Diagnosis not present

## 2019-08-20 DIAGNOSIS — F329 Major depressive disorder, single episode, unspecified: Secondary | ICD-10-CM | POA: Diagnosis not present

## 2019-09-03 DIAGNOSIS — E119 Type 2 diabetes mellitus without complications: Secondary | ICD-10-CM | POA: Diagnosis not present

## 2019-09-03 DIAGNOSIS — H40011 Open angle with borderline findings, low risk, right eye: Secondary | ICD-10-CM | POA: Diagnosis not present

## 2019-11-02 ENCOUNTER — Other Ambulatory Visit: Payer: Self-pay | Admitting: Physician Assistant

## 2019-11-02 DIAGNOSIS — I10 Essential (primary) hypertension: Secondary | ICD-10-CM

## 2019-11-16 DIAGNOSIS — E119 Type 2 diabetes mellitus without complications: Secondary | ICD-10-CM | POA: Diagnosis not present

## 2019-11-18 DIAGNOSIS — N393 Stress incontinence (female) (male): Secondary | ICD-10-CM | POA: Diagnosis not present

## 2019-11-24 DIAGNOSIS — Z01419 Encounter for gynecological examination (general) (routine) without abnormal findings: Secondary | ICD-10-CM | POA: Diagnosis not present

## 2019-11-24 DIAGNOSIS — Z01411 Encounter for gynecological examination (general) (routine) with abnormal findings: Secondary | ICD-10-CM | POA: Diagnosis not present

## 2019-11-24 DIAGNOSIS — E282 Polycystic ovarian syndrome: Secondary | ICD-10-CM | POA: Diagnosis not present

## 2019-11-24 DIAGNOSIS — F4310 Post-traumatic stress disorder, unspecified: Secondary | ICD-10-CM | POA: Diagnosis not present

## 2019-11-24 LAB — HM PAP SMEAR: HM Pap smear: NEGATIVE

## 2019-12-21 DIAGNOSIS — F329 Major depressive disorder, single episode, unspecified: Secondary | ICD-10-CM | POA: Diagnosis not present

## 2019-12-24 ENCOUNTER — Other Ambulatory Visit: Payer: Self-pay | Admitting: *Deleted

## 2019-12-24 DIAGNOSIS — I10 Essential (primary) hypertension: Secondary | ICD-10-CM

## 2019-12-24 NOTE — Telephone Encounter (Signed)
Former Ronnald Ramp. NTBS 30 days given 11/02/19

## 2019-12-25 NOTE — Telephone Encounter (Signed)
LM to call back for appt - jhb 12/25/19

## 2019-12-29 DIAGNOSIS — E119 Type 2 diabetes mellitus without complications: Secondary | ICD-10-CM | POA: Diagnosis not present

## 2020-01-06 DIAGNOSIS — I10 Essential (primary) hypertension: Secondary | ICD-10-CM | POA: Diagnosis not present

## 2020-01-06 DIAGNOSIS — E1165 Type 2 diabetes mellitus with hyperglycemia: Secondary | ICD-10-CM | POA: Diagnosis not present

## 2020-01-06 DIAGNOSIS — G609 Hereditary and idiopathic neuropathy, unspecified: Secondary | ICD-10-CM | POA: Diagnosis not present

## 2020-01-06 DIAGNOSIS — E7800 Pure hypercholesterolemia, unspecified: Secondary | ICD-10-CM | POA: Diagnosis not present

## 2020-01-12 ENCOUNTER — Encounter: Payer: Self-pay | Admitting: Family Medicine

## 2020-01-12 ENCOUNTER — Ambulatory Visit (INDEPENDENT_AMBULATORY_CARE_PROVIDER_SITE_OTHER): Payer: BC Managed Care – PPO | Admitting: Family Medicine

## 2020-01-12 ENCOUNTER — Other Ambulatory Visit: Payer: Self-pay

## 2020-01-12 VITALS — BP 113/73 | HR 87 | Temp 97.8°F | Resp 20 | Ht 66.0 in | Wt 354.0 lb

## 2020-01-12 DIAGNOSIS — J4520 Mild intermittent asthma, uncomplicated: Secondary | ICD-10-CM

## 2020-01-12 DIAGNOSIS — Z114 Encounter for screening for human immunodeficiency virus [HIV]: Secondary | ICD-10-CM | POA: Diagnosis not present

## 2020-01-12 DIAGNOSIS — I679 Cerebrovascular disease, unspecified: Secondary | ICD-10-CM | POA: Diagnosis not present

## 2020-01-12 DIAGNOSIS — Z0000 Encounter for general adult medical examination without abnormal findings: Secondary | ICD-10-CM

## 2020-01-12 DIAGNOSIS — Z23 Encounter for immunization: Secondary | ICD-10-CM

## 2020-01-12 DIAGNOSIS — F331 Major depressive disorder, recurrent, moderate: Secondary | ICD-10-CM

## 2020-01-12 DIAGNOSIS — E7800 Pure hypercholesterolemia, unspecified: Secondary | ICD-10-CM

## 2020-01-12 DIAGNOSIS — R32 Unspecified urinary incontinence: Secondary | ICD-10-CM

## 2020-01-12 DIAGNOSIS — E1142 Type 2 diabetes mellitus with diabetic polyneuropathy: Secondary | ICD-10-CM

## 2020-01-12 DIAGNOSIS — Z794 Long term (current) use of insulin: Secondary | ICD-10-CM

## 2020-01-12 DIAGNOSIS — I10 Essential (primary) hypertension: Secondary | ICD-10-CM

## 2020-01-12 DIAGNOSIS — F4310 Post-traumatic stress disorder, unspecified: Secondary | ICD-10-CM

## 2020-01-12 HISTORY — DX: Morbid (severe) obesity due to excess calories: E66.01

## 2020-01-12 MED ORDER — FUROSEMIDE 20 MG PO TABS: 20.0000 mg | ORAL_TABLET | Freq: Every day | ORAL | 1 refills | Status: DC

## 2020-01-12 MED ORDER — POTASSIUM CHLORIDE ER 10 MEQ PO TBCR: 20.0000 meq | EXTENDED_RELEASE_TABLET | Freq: Every day | ORAL | 1 refills | Status: DC

## 2020-01-12 MED ORDER — CLOTRIMAZOLE 1 % EX CREA: 1.0000 "application " | TOPICAL_CREAM | CUTANEOUS | 0 refills | Status: DC | PRN

## 2020-01-12 MED ORDER — TETANUS-DIPHTH-ACELL PERTUSSIS 5-2.5-18.5 LF-MCG/0.5 IM SUSP: 0.5000 mL | Freq: Once | INTRAMUSCULAR | 0 refills | Status: AC

## 2020-01-12 MED ORDER — ALBUTEROL SULFATE HFA 108 (90 BASE) MCG/ACT IN AERS: 2.0000 | INHALATION_SPRAY | Freq: Four times a day (QID) | RESPIRATORY_TRACT | 5 refills | Status: DC | PRN

## 2020-01-12 MED ORDER — AMLODIPINE BESYLATE 5 MG PO TABS: 5.0000 mg | ORAL_TABLET | Freq: Every day | ORAL | 1 refills | Status: DC

## 2020-01-12 MED ORDER — CLOPIDOGREL BISULFATE 75 MG PO TABS: 75.0000 mg | ORAL_TABLET | Freq: Every day | ORAL | 1 refills | Status: DC

## 2020-01-12 NOTE — Patient Instructions (Signed)
  Call [facility] and schedule your mammogram.

## 2020-01-12 NOTE — Progress Notes (Signed)
Assessment & Plan:  1. Essential hypertension - Well controlled on current regimen.  - amLODipine (NORVASC) 5 MG tablet; Take 1 tablet (5 mg total) by mouth daily.  Dispense: 90 tablet; Refill: 1 - furosemide (LASIX) 20 MG tablet; Take 1 tablet (20 mg total) by mouth daily.  Dispense: 90 tablet; Refill: 1 - potassium chloride (KLOR-CON) 10 MEQ tablet; Take 2 tablets (20 mEq total) by mouth daily.  Dispense: 90 tablet; Refill: 1  2. Morbid obesity ([place]) - Patient tries to exercise as much as possible to aid in weight loss.   3. Mild intermittent asthma without complication - Well controlled on current regimen.  - albuterol (PROAIR HFA) 108 (90 Base) MCG/ACT inhaler; Inhale 2 puffs into the lungs every 6 (six) hours as needed for wheezing or shortness of breath.  Dispense: 18 g; Refill: 5  4. Small vessel disease, cerebrovascular - Patient agreeable to restarting Plavix after discussing risks.  - clopidogrel (PLAVIX) 75 MG tablet; Take 1 tablet (75 mg total) by mouth daily. TAKE (1) TABLET BY MOUTH ONCE DAILY.  Dispense: 90 tablet; Refill: 1  5. Urinary incontinence, unspecified type - Patient wears depends (size 2X) that she gets from [place].   6. Type 2 diabetes mellitus with diabetic polyneuropathy, with long-term current use of insulin ([place]) - Managed by Dr. Chalmers Cater (endocrinology). Records and labs requested.   7. High cholesterol - Managed by Dr. Chalmers Cater (endocrinology). Records and labs requested.   8. PTSD (post-traumatic stress disorder) - Managed by Dr. Hoyle Barr (psychiatrist).   9. Moderate episode of recurrent major depressive disorder ([place]) - Managed by Dr. Hoyle Barr (psychiatrist).   10. Encounter for screening for HIV - HIV Antibody (routine testing w rflx)  11. Immunization due - Tdap (BOOSTRIX) 5-2.5-18.5 LF-MCG/0.5 injection; Inject 0.5 mLs into the muscle once for 1 dose.  Dispense: 0.5 mL; Refill: 0  12. Healthcare maintenance - Patient to schedule mammogram. She is  already having colonoscopies due to family history of colon cancer in her sisters. She has had polyps in the past. It has been 6 years since her last. She reports she was advised to return in 5-10 years; I encouraged her to go ahead and schedule. Patient reports having pneumonia vaccine, foot exam, urine microalbumin, A1c, and lab work with Dr. Chalmers Cater; records are being requested. Diabetic eye exam requested from Dr. Radford Pax in [place].    Return in about 6 months (around 07/14/2020) for annual physical.  Hendricks Limes, MSN, APRN, FNP-C [facility]  Subjective:    Patient ID: Kathryn Jimenez, female    DOB: June 05, 1971, 49 y.o.   MRN: [id number]  Patient Care Team: Loman Brooklyn, FNP as PCP - General (Family Medicine) Jacelyn Pi, MD as Consulting Physician (Endocrinology) Rogene Houston, MD as Consulting Physician (Gastroenterology) Sandford Craze, MD as Consulting Physician (Dermatology) Case, Reche Dixon, MD as Consulting Physician (Orthopedic Surgery)   Chief Complaint:  Chief Complaint  Patient presents with  . Medical Management of Chronic Issues    ([place] pt)   . Diabetes  . Hypertension    HPI: Timmia Dufrane is a 49 y.o. female presenting on 01/12/2020 for Medical Management of Chronic Issues (([place] pt) ), Diabetes, and Hypertension  Dr. Chalmers Cater (endocrinologist) manages the thyroid nodule, diabetes, and cholesterol. She prescribes Novolog. She also manages patient's insulin pump.   [facility] - Dr. Hoyle Barr (psychiatry) manages PTSD, OCD, borderline personality disorder, and depression. He prescribes Wellbutrin, Prozac, and Ativan. She only uses Ativan for extreme anxiety/agitaiton  and PMDD.   Dr. Barrie Dunker (OBGYN) manages PCOS and herpes. He prescribes Nystatin, Provera, and Valtrex.   Patient was previously taking Plavix due to a stroke. She quit taking it because she has been bleeding at her gums. She also quit Doxepin for the same reason.    Social  history:  Relevant past medical, surgical, family and social history reviewed and updated as indicated. Interim medical history since our last visit reviewed.  Allergies and medications reviewed and updated.  DATA REVIEWED: CHART IN EPIC  ROS: Negative unless specifically indicated above in HPI.    Current Outpatient Medications:  .  acetaminophen (TYLENOL) 500 MG tablet, Take 500 mg by mouth every 6 (six) hours as needed., Disp: , Rfl:  .  albuterol (PROAIR HFA) 108 (90 Base) MCG/ACT inhaler, Inhale 2 puffs into the lungs every 6 (six) hours as needed for wheezing or shortness of breath., Disp: 18 g, Rfl: 5 .  amLODipine (NORVASC) 5 MG tablet, Take 1 tablet (5 mg total) by mouth daily., Disp: 90 tablet, Rfl: 1 .  buPROPion (WELLBUTRIN XL) 150 MG 24 hr tablet, Take 150 mg by mouth daily., Disp: , Rfl:  .  clotrimazole (LOTRIMIN) 1 % cream, Apply 1 application topically as needed., Disp: 60 g, Rfl: 0 .  docusate sodium (COLACE) 100 MG capsule, Take 2 capsules (200 mg total) by mouth at bedtime. (Patient taking differently: Take 200 mg by mouth as needed. ), Disp: , Rfl: 0 .  FLUoxetine (PROZAC) 40 MG capsule, Take 40 mg by mouth daily., Disp: , Rfl:  .  furosemide (LASIX) 20 MG tablet, Take 1 tablet (20 mg total) by mouth daily., Disp: 90 tablet, Rfl: 1 .  insulin aspart (NOVOLOG) 100 UNIT/ML injection, Inject into the skin 3 (three) times daily before meals. Per pump, Disp: , Rfl:  .  ketoconazole (NIZORAL) 2 % cream, Apply 1 application topically daily., Disp: 30 g, Rfl: 0 .  LORazepam (ATIVAN) 0.5 MG tablet, Take 0.5 mg by mouth. Takes 0.25mg  - 0.50mg , 1-2 time daily as needed., Disp: , Rfl:  .  medroxyPROGESTERone (PROVERA) 10 MG tablet, Take 10 mg by mouth See admin instructions. Reported on 12/26/2015, Disp: , Rfl:  .  nystatin ointment (MYCOSTATIN), Apply 1 application topically 3 (three) times daily as needed., Disp: , Rfl:  .  potassium chloride (KLOR-CON) 10 MEQ tablet, Take 2 tablets  (20 mEq total) by mouth daily., Disp: 90 tablet, Rfl: 1 .  valACYclovir (VALTREX) 500 MG tablet, Take 500 mg by mouth daily., Disp: , Rfl:  .  clopidogrel (PLAVIX) 75 MG tablet, Take 1 tablet (75 mg total) by mouth daily. TAKE (1) TABLET BY MOUTH ONCE DAILY., Disp: 90 tablet, Rfl: 1 .  cycloSPORINE (RESTASIS) 0.05 % ophthalmic emulsion, Apply to eye., Disp: , Rfl:  .  Tdap (BOOSTRIX) 5-2.5-18.5 LF-MCG/0.5 injection, Inject 0.5 mLs into the muscle once for 1 dose., Disp: 0.5 mL, Rfl: 0   Allergies  Allergen Reactions  . Augmentin [Amoxicillin-Pot Clavulanate] Nausea Only    Causes severe diarrhea  . Invokana [Canagliflozin]   . Lopid [Gemfibrozil] Other (See Comments)    Burning tounge  . Naproxen Sodium Other (See Comments)    Patient states she felt loopy  . Statins Other (See Comments)    Joint pains  . Morphine And Related Itching and Rash   Past Medical History:  Diagnosis Date  . Allergy   . Anginal pain ([place])   . Anxiety   . Arthritis   .  Asthma   . Bladder disorder, other   . Borderline personality disorder ([place])   . Carotid artery disease ([place]) 03/05/2017  . Depression   . GERD (gastroesophageal reflux disease)   . Headache   . Hyperlipidemia   . Hypertension   . Kidney stones   . Neuromuscular disorder (HCC)    Neuropathy  . PTSD (post-traumatic stress disorder)   . Shortness of breath   . Sleep apnea   . Stroke ([place])   . Thyroid disease    nodule    Past Surgical History:  Procedure Laterality Date  . CARPAL TUNNEL RELEASE     left 2015  . CERVICAL FUSION Bilateral 2003   C 5-6 with titanium screws  . CHOLECYSTECTOMY    . COLONOSCOPY WITH PROPOFOL N/A 06/24/2014   Procedure: COLONOSCOPY WITH PROPOFOL;  Surgeon: Rogene Houston, MD;  Location: [facility];  Service: Endoscopy;  Laterality: N/A;  cecum time in 0810    time out   0821    total time 11 minutes  . POLYPECTOMY N/A 06/24/2014   Procedure: RECTAL POLYPECTOMY;  Surgeon: Rogene Houston, MD;  Location:  [facility];  Service: Endoscopy;  Laterality: N/A;  . SPINE SURGERY    . URETHRAL DILATION  1981  . WISDOM TOOTH EXTRACTION Bilateral 2001 and 1990s   top and bottom     Social History   Socioeconomic History  . Marital status: Married    Spouse name: [name]  . Number of children: 0  . Years of education: HS  . Highest education level: Not on file  Occupational History  . Occupation: Disabled  Tobacco Use  . Smoking status: Never Smoker  . Smokeless tobacco: Never Used  Substance and Sexual Activity  . Alcohol use: No    Comment: Rarely  . Drug use: No  . Sexual activity: Not Currently    Birth control/protection: Pill  Other Topics Concern  . Not on file  Social History Narrative   Lives at home with husband.   Right-handed.   3-4 cups caffeine per day.   Social Determinants of Health   Financial Resource Strain:   . Difficulty of Paying Living Expenses:   Food Insecurity:   . Worried About Charity fundraiser in the Last Year:   . Arboriculturist in the Last Year:   Transportation Needs:   . Film/video editor (Medical):   Marland Kitchen Lack of Transportation (Non-Medical):   Physical Activity:   . Days of Exercise per Week:   . Minutes of Exercise per Session:   Stress:   . Feeling of Stress :   Social Connections:   . Frequency of Communication with Friends and Family:   . Frequency of Social Gatherings with Friends and Family:   . Attends Religious Services:   . Active Member of Clubs or Organizations:   . Attends Archivist Meetings:   Marland Kitchen Marital Status:   Intimate Partner Violence:   . Fear of Current or Ex-Partner:   . Emotionally Abused:   Marland Kitchen Physically Abused:   . Sexually Abused:         Objective:    BP 113/73   Pulse 87   Temp 97.8 F (36.6 C)   Resp 20   Ht 5\' 6"  (1.676 m)   Wt (!) 354 lb (160.6 kg)   LMP 12/03/2019 (Approximate)   SpO2 96%   BMI 57.14 kg/m   Wt Readings from Last 3 Encounters:  01/12/20 Marland Kitchen)  354 lb (160.6 kg)   03/17/19 (!) 350 lb 3.2 oz (158.8 kg)  05/29/18 (!) 349 lb (158.3 kg)    Physical Exam Vitals reviewed.  Constitutional:      General: She is not in acute distress.    Appearance: Normal appearance. She is morbidly obese. She is not ill-appearing, toxic-appearing or diaphoretic.  HENT:     Head: Normocephalic and atraumatic.  Eyes:     General: No scleral icterus.       Right eye: No discharge.        Left eye: No discharge.     Conjunctiva/sclera: Conjunctivae normal.  Cardiovascular:     Rate and Rhythm: Normal rate and regular rhythm.     Heart sounds: Normal heart sounds. No murmur. No friction rub. No gallop.   Pulmonary:     Effort: Pulmonary effort is normal. No respiratory distress.     Breath sounds: Normal breath sounds. No stridor. No wheezing, rhonchi or rales.  Abdominal:     Comments: Abdominal pannus.   Musculoskeletal:        General: Normal range of motion.     Cervical back: Normal range of motion.  Skin:    General: Skin is warm and dry.     Capillary Refill: Capillary refill takes less than 2 seconds.  Neurological:     General: No focal deficit present.     Mental Status: She is alert and oriented to person, place, and time. Mental status is at baseline.  Psychiatric:        Mood and Affect: Mood normal.        Behavior: Behavior normal.        Thought Content: Thought content normal.        Judgment: Judgment normal.     Lab Results  Component Value Date   TSH 2.320 12/18/2016   Lab Results  Component Value Date   WBC 10.0 07/01/2017   HGB 13.3 07/01/2017   HCT 39.9 07/01/2017   MCV 89 07/01/2017   PLT 421 (H) 07/01/2017   Lab Results  Component Value Date   NA 137 07/01/2017   K 4.2 07/01/2017   CO2 26 07/01/2017   GLUCOSE 202 (H) 07/01/2017   BUN 8 07/01/2017   CREATININE 0.65 07/01/2017   BILITOT <0.2 07/01/2017   ALKPHOS 108 07/01/2017   AST 21 07/01/2017   ALT 18 07/01/2017   PROT 6.9 07/01/2017   ALBUMIN 4.1 07/01/2017    CALCIUM 9.5 07/01/2017   ANIONGAP 12 06/17/2014   Lab Results  Component Value Date   CHOL 230 (H) 07/01/2017   Lab Results  Component Value Date   HDL 44 07/01/2017   Lab Results  Component Value Date   LDLCALC 119 (H) 07/01/2017   Lab Results  Component Value Date   TRIG 336 (H) 07/01/2017   Lab Results  Component Value Date   CHOLHDL 5.2 (H) 07/01/2017   Lab Results  Component Value Date   HGBA1C 9.0 (H) 07/01/2017

## 2020-01-13 LAB — HIV ANTIBODY (ROUTINE TESTING W REFLEX): HIV Screen 4th Generation wRfx: NONREACTIVE

## 2020-01-15 ENCOUNTER — Telehealth: Payer: Self-pay | Admitting: *Deleted

## 2020-01-15 NOTE — Telephone Encounter (Signed)
Aware, last lab was negative.

## 2020-02-05 DIAGNOSIS — E119 Type 2 diabetes mellitus without complications: Secondary | ICD-10-CM | POA: Diagnosis not present

## 2020-03-01 DIAGNOSIS — E083292 Diabetes mellitus due to underlying condition with mild nonproliferative diabetic retinopathy without macular edema, left eye: Secondary | ICD-10-CM | POA: Diagnosis not present

## 2020-03-01 LAB — HM DIABETES EYE EXAM

## 2020-03-07 DIAGNOSIS — E119 Type 2 diabetes mellitus without complications: Secondary | ICD-10-CM | POA: Diagnosis not present

## 2020-04-18 DIAGNOSIS — F329 Major depressive disorder, single episode, unspecified: Secondary | ICD-10-CM | POA: Diagnosis not present

## 2020-04-22 DIAGNOSIS — E119 Type 2 diabetes mellitus without complications: Secondary | ICD-10-CM | POA: Diagnosis not present

## 2020-05-10 DIAGNOSIS — E049 Nontoxic goiter, unspecified: Secondary | ICD-10-CM | POA: Diagnosis not present

## 2020-05-10 DIAGNOSIS — G609 Hereditary and idiopathic neuropathy, unspecified: Secondary | ICD-10-CM | POA: Diagnosis not present

## 2020-05-10 DIAGNOSIS — E7800 Pure hypercholesterolemia, unspecified: Secondary | ICD-10-CM | POA: Diagnosis not present

## 2020-05-10 DIAGNOSIS — I10 Essential (primary) hypertension: Secondary | ICD-10-CM | POA: Diagnosis not present

## 2020-05-10 DIAGNOSIS — E669 Obesity, unspecified: Secondary | ICD-10-CM | POA: Diagnosis not present

## 2020-05-10 DIAGNOSIS — E1165 Type 2 diabetes mellitus with hyperglycemia: Secondary | ICD-10-CM | POA: Diagnosis not present

## 2020-05-16 ENCOUNTER — Other Ambulatory Visit: Payer: Self-pay | Admitting: *Deleted

## 2020-05-16 DIAGNOSIS — I10 Essential (primary) hypertension: Secondary | ICD-10-CM

## 2020-05-16 MED ORDER — POTASSIUM CHLORIDE ER 10 MEQ PO TBCR: 20.0000 meq | EXTENDED_RELEASE_TABLET | Freq: Every day | ORAL | 0 refills | Status: DC

## 2020-06-02 DIAGNOSIS — E119 Type 2 diabetes mellitus without complications: Secondary | ICD-10-CM | POA: Diagnosis not present

## 2020-06-21 DIAGNOSIS — Z029 Encounter for administrative examinations, unspecified: Secondary | ICD-10-CM

## 2020-06-22 ENCOUNTER — Telehealth: Payer: Self-pay | Admitting: Family Medicine

## 2020-06-22 NOTE — Telephone Encounter (Signed)
Patient with intermittent inability to ambulate due to history of CVA, morbid obesity, asthma.  She uses a Rollator intermittently to walk.  Think that handicap placard is appropriate and I have completed this for the patient.  She is aware that she may pick it up tomorrow and voices appreciation for the call.  Stayce Delancy M. Lajuana Ripple, [facility]

## 2020-07-13 ENCOUNTER — Encounter: Payer: Self-pay | Admitting: *Deleted

## 2020-07-16 DIAGNOSIS — E119 Type 2 diabetes mellitus without complications: Secondary | ICD-10-CM | POA: Diagnosis not present

## 2020-08-18 ENCOUNTER — Encounter: Payer: BC Managed Care – PPO | Admitting: Family Medicine

## 2020-08-18 DIAGNOSIS — E119 Type 2 diabetes mellitus without complications: Secondary | ICD-10-CM | POA: Diagnosis not present

## 2020-08-23 ENCOUNTER — Other Ambulatory Visit: Payer: Self-pay | Admitting: Family Medicine

## 2020-08-23 DIAGNOSIS — I10 Essential (primary) hypertension: Secondary | ICD-10-CM

## 2020-08-30 DIAGNOSIS — F329 Major depressive disorder, single episode, unspecified: Secondary | ICD-10-CM | POA: Diagnosis not present

## 2020-09-06 ENCOUNTER — Encounter: Payer: BC Managed Care – PPO | Admitting: Family Medicine

## 2020-09-08 ENCOUNTER — Encounter: Payer: Self-pay | Admitting: Family Medicine

## 2020-09-08 ENCOUNTER — Other Ambulatory Visit: Payer: Self-pay

## 2020-09-08 ENCOUNTER — Ambulatory Visit (INDEPENDENT_AMBULATORY_CARE_PROVIDER_SITE_OTHER): Payer: BC Managed Care – PPO | Admitting: Family Medicine

## 2020-09-08 VITALS — BP 171/70 | HR 80 | Temp 97.8°F | Ht 66.0 in | Wt 348.0 lb

## 2020-09-08 DIAGNOSIS — F4310 Post-traumatic stress disorder, unspecified: Secondary | ICD-10-CM | POA: Diagnosis not present

## 2020-09-08 DIAGNOSIS — I10 Essential (primary) hypertension: Secondary | ICD-10-CM

## 2020-09-08 DIAGNOSIS — I679 Cerebrovascular disease, unspecified: Secondary | ICD-10-CM

## 2020-09-08 DIAGNOSIS — E65 Localized adiposity: Secondary | ICD-10-CM

## 2020-09-08 DIAGNOSIS — E559 Vitamin D deficiency, unspecified: Secondary | ICD-10-CM

## 2020-09-08 DIAGNOSIS — E1142 Type 2 diabetes mellitus with diabetic polyneuropathy: Secondary | ICD-10-CM

## 2020-09-08 DIAGNOSIS — Z794 Long term (current) use of insulin: Secondary | ICD-10-CM

## 2020-09-08 DIAGNOSIS — Z6841 Body Mass Index (BMI) 40.0 and over, adult: Secondary | ICD-10-CM

## 2020-09-08 MED ORDER — POTASSIUM CHLORIDE ER 10 MEQ PO TBCR: EXTENDED_RELEASE_TABLET | ORAL | 1 refills | Status: DC

## 2020-09-08 MED ORDER — CLOPIDOGREL BISULFATE 75 MG PO TABS: 75.0000 mg | ORAL_TABLET | Freq: Every day | ORAL | 1 refills | Status: DC

## 2020-09-08 MED ORDER — FUROSEMIDE 20 MG PO TABS: 20.0000 mg | ORAL_TABLET | Freq: Every day | ORAL | 1 refills | Status: DC

## 2020-09-08 NOTE — Patient Instructions (Signed)
Please schedule your mammogram and colonoscopy.   DASH Eating Plan DASH stands for Dietary Approaches to Stop Hypertension. The DASH eating plan is a healthy eating plan that has been shown to:  Reduce high blood pressure (hypertension).  Reduce your risk for type 2 diabetes, heart disease, and stroke.  Help with weight loss. What are tips for following this plan? Reading food labels  Check food labels for the amount of salt (sodium) per serving. Choose foods with less than 5 percent of the Daily Value of sodium. Generally, foods with less than 300 milligrams (mg) of sodium per serving fit into this eating plan.  To find whole grains, look for the word "whole" as the first word in the ingredient list. Shopping  Buy products labeled as "low-sodium" or "no salt added."  Buy fresh foods. Avoid canned foods and pre-made or frozen meals. Cooking  Avoid adding salt when cooking. Use salt-free seasonings or herbs instead of table salt or sea salt. Check with your health care provider or pharmacist before using salt substitutes.  Do not fry foods. Cook foods using healthy methods such as baking, boiling, grilling, roasting, and broiling instead.  Cook with heart-healthy oils, such as olive, canola, avocado, soybean, or sunflower oil. Meal planning  Eat a balanced diet that includes: ? 4 or more servings of fruits and 4 or more servings of vegetables each day. Try to fill one-half of your plate with fruits and vegetables. ? 6-8 servings of whole grains each day. ? Less than 6 oz (170 g) of lean meat, poultry, or fish each day. A 3-oz (85-g) serving of meat is about the same size as a deck of cards. One egg equals 1 oz (28 g). ? 2-3 servings of low-fat dairy each day. One serving is 1 cup (237 mL). ? 1 serving of nuts, seeds, or beans 5 times each week. ? 2-3 servings of heart-healthy fats. Healthy fats called omega-3 fatty acids are found in foods such as walnuts, flaxseeds, fortified  milks, and eggs. These fats are also found in cold-water fish, such as sardines, salmon, and mackerel.  Limit how much you eat of: ? Canned or prepackaged foods. ? Food that is high in trans fat, such as some fried foods. ? Food that is high in saturated fat, such as fatty meat. ? Desserts and other sweets, sugary drinks, and other foods with added sugar. ? Full-fat dairy products.  Do not salt foods before eating.  Do not eat more than 4 egg yolks a week.  Try to eat at least 2 vegetarian meals a week.  Eat more home-cooked food and less restaurant, buffet, and fast food.   Lifestyle  When eating at a restaurant, ask that your food be prepared with less salt or no salt, if possible.  If you drink alcohol: ? Limit how much you use to:  0-1 drink a day for women who are not pregnant.  0-2 drinks a day for men. ? Be aware of how much alcohol is in your drink. In the [place] one drink equals one 12 oz bottle of beer (355 mL), one 5 oz glass of wine (148 mL), or one 1 oz glass of hard liquor (44 mL). General information  Avoid eating more than 2,300 mg of salt a day. If you have hypertension, you may need to reduce your sodium intake to 1,500 mg a day.  Work with your health care provider to maintain a healthy body weight or to lose weight. Ask  what an ideal weight is for you.  Get at least 30 minutes of exercise that causes your heart to beat faster (aerobic exercise) most days of the week. Activities may include walking, swimming, or biking.  Work with your health care provider or dietitian to adjust your eating plan to your individual calorie needs. What foods should I eat? Fruits All fresh, dried, or frozen fruit. Canned fruit in natural juice (without added sugar). Vegetables Fresh or frozen vegetables (raw, steamed, roasted, or grilled). Low-sodium or reduced-sodium tomato and vegetable juice. Low-sodium or reduced-sodium tomato sauce and tomato paste. Low-sodium or  reduced-sodium canned vegetables. Grains Whole-grain or whole-wheat bread. Whole-grain or whole-wheat pasta. Brown rice. [place]. Bulgur. Whole-grain and low-sodium cereals. Pita bread. Low-fat, low-sodium crackers. Whole-wheat flour tortillas. Meats and other proteins Skinless chicken or [place]. Ground chicken or [place]. Pork with fat trimmed off. Fish and seafood. Egg whites. Dried beans, peas, or lentils. Unsalted nuts, nut butters, and seeds. Unsalted canned beans. Lean cuts of beef with fat trimmed off. Low-sodium, lean precooked or cured meat, such as sausages or meat loaves. Dairy Low-fat (1%) or fat-free (skim) milk. Reduced-fat, low-fat, or fat-free cheeses. Nonfat, low-sodium ricotta or cottage cheese. Low-fat or nonfat yogurt. Low-fat, low-sodium cheese. Fats and oils Soft margarine without trans fats. Vegetable oil. Reduced-fat, low-fat, or light mayonnaise and salad dressings (reduced-sodium). Canola, safflower, olive, avocado, soybean, and sunflower oils. Avocado. Seasonings and condiments Herbs. Spices. Seasoning mixes without salt. Other foods Unsalted popcorn and pretzels. Fat-free sweets. The items listed above may not be a complete list of foods and beverages you can eat. Contact a dietitian for more information. What foods should I avoid? Fruits Canned fruit in a light or heavy syrup. Fried fruit. Fruit in cream or butter sauce. Vegetables Creamed or fried vegetables. Vegetables in a cheese sauce. Regular canned vegetables (not low-sodium or reduced-sodium). Regular canned tomato sauce and paste (not low-sodium or reduced-sodium). Regular tomato and vegetable juice (not low-sodium or reduced-sodium). Angie Fava. Olives. Grains Baked goods made with fat, such as croissants, muffins, or some breads. Dry pasta or rice meal packs. Meats and other proteins Fatty cuts of meat. Ribs. Fried meat. Berniece Salines. Bologna, salami, and other precooked or cured meats, such as sausages or  meat loaves. Fat from the back of a pig (fatback). Bratwurst. Salted nuts and seeds. Canned beans with added salt. Canned or smoked fish. Whole eggs or egg yolks. Chicken or [place] with skin. Dairy Whole or 2% milk, cream, and half-and-half. Whole or full-fat cream cheese. Whole-fat or sweetened yogurt. Full-fat cheese. Nondairy creamers. Whipped toppings. Processed cheese and cheese spreads. Fats and oils Butter. Stick margarine. Lard. Shortening. Ghee. Bacon fat. Tropical oils, such as coconut, palm kernel, or palm oil. Seasonings and condiments Onion salt, garlic salt, seasoned salt, table salt, and sea salt. Worcestershire sauce. Tartar sauce. Barbecue sauce. Teriyaki sauce. Soy sauce, including reduced-sodium. Steak sauce. Canned and packaged gravies. Fish sauce. Oyster sauce. Cocktail sauce. Store-bought horseradish. Ketchup. Mustard. Meat flavorings and tenderizers. Bouillon cubes. Hot sauces. Pre-made or packaged marinades. Pre-made or packaged taco seasonings. Relishes. Regular salad dressings. Other foods Salted popcorn and pretzels. The items listed above may not be a complete list of foods and beverages you should avoid. Contact a dietitian for more information. Where to find more information  National Heart, Lung, and Blood Institute: https://wilson-eaton.com/  American Heart Association: www.heart.org  Academy of Nutrition and Dietetics: www.eatright.[place]: www.kidney.org Summary  The DASH eating plan is a healthy eating  plan that has been shown to reduce high blood pressure (hypertension). It may also reduce your risk for type 2 diabetes, heart disease, and stroke.  When on the DASH eating plan, aim to eat more fresh fruits and vegetables, whole grains, lean proteins, low-fat dairy, and heart-healthy fats.  With the DASH eating plan, you should limit salt (sodium) intake to 2,300 mg a day. If you have hypertension, you may need to reduce your sodium intake to  1,500 mg a day.  Work with your health care provider or dietitian to adjust your eating plan to your individual calorie needs. This information is not intended to replace advice given to you by your health care provider. Make sure you discuss any questions you have with your health care provider. Document Revised: 07/10/2019 Document Reviewed: 07/10/2019 Elsevier Patient Education  2021 [employer].

## 2020-09-08 NOTE — Progress Notes (Signed)
Assessment & Plan:  1. Essential hypertension - Uncontrolled. Patient is not agreeable to restarting medication to treat. Diet and exercise encouraged. Education provided on the DASH diet.  - potassium chloride (KLOR-CON) 10 MEQ tablet; TAKE (1) TABLET TWICE DAILY.  Dispense: 180 tablet; Refill: 1 - furosemide (LASIX) 20 MG tablet; Take 1 tablet (20 mg total) by mouth daily.  Dispense: 90 tablet; Refill: 1  2. Small vessel disease, cerebrovascular - clopidogrel (PLAVIX) 75 MG tablet; Take 1 tablet (75 mg total) by mouth daily. TAKE (1) TABLET BY MOUTH ONCE DAILY.  Dispense: 90 tablet; Refill: 1  3. PTSD (post-traumatic stress disorder) - Managed by psychiatry.   4. Morbid obesity with BMI of 50.0-59.9, adult [facility]) - Patient not agreeable to a referral to weight loss specialist. Encouraged diet and exercise.   5. Abdominal pannus - Patient not agreeable to a referral to weight loss specialist. Encouraged diet and exercise.   6. Type 2 diabetes mellitus with diabetic polyneuropathy, with long-term current use of insulin ([place]) - Managed by endocrinologist. Record release signed to get recent notes and labs.   7. Vitamin D deficiency - Managed by endocrinologist. Record release signed to get recent notes and labs.    A letter was provided to excuse patient from jury duty due to PTSD, borderline personality disorder, and recent CVA.   Return in about 6 months (around 03/08/2021) for annual physical.  Hendricks Limes, MSN, APRN, FNP-C [facility]  Subjective:    Patient ID: Kathryn Jimenez, female    DOB: 1971-02-26, 50 y.o.   MRN: [id number]  Patient Care Team: Loman Brooklyn, FNP as PCP - General (Family Medicine) Jacelyn Pi, MD as Consulting Physician (Endocrinology) Rogene Houston, MD as Consulting Physician (Gastroenterology) Sandford Craze, MD as Consulting Physician (Dermatology) Case, Reche Dixon, MD as Consulting Physician (Orthopedic Surgery)    Chief Complaint:  Chief Complaint  Patient presents with  . Hypertension    Check up of chronic medical conditions   . Wrist Pain    Right x 1 week. Patient states that the pain is better but she still has on and off pain.     HPI: Kathryn Jimenez is a 50 y.o. female presenting on 09/08/2020 for Hypertension (Check up of chronic medical conditions/) and Wrist Pain (Right x 1 week. Patient states that the pain is better but she still has on and off pain./)  Patient reports she has not ate dairy since the beginning of the year and her stomach has been feeling much better.   Vitamin D Deficiency: patient reports her endocrinologist monitors her vitamin D level when she does her lab work.   Morbid Obesity: patient has lost 6 lbs since our last visit. She is not interested in being referred to Dr. Leafy Ro for weight loss as she states she cannot get to [facility] for the appointments. She feels she is doing well maintaining her current weight.   Hypertension: patient quit taking several of her medications in the past including her amlodipine. She has no intention of resuming and states her BP readings when she goes to her other doctors is normally 130s/70s.   Diabetes: managed by her endocrinologist. States her A1c has improved and there were no changes to her medications because of this. She is due for follow-up in March.   Asthma: states she rarely uses her Albuterol and when she does it is exercise induced.   New complaints: Patient reports right wrist pain that started one week  ago. She reports the pain is from having to lift up her abdominal pannus to wipe when she uses the restroom. She states it is better now after taking Tylenol and resting it.   Patient is requesting a letter to excuse her from jury duty.    Social history:  Relevant past medical, surgical, family and social history reviewed and updated as indicated. Interim medical history since our last visit  reviewed.  Allergies and medications reviewed and updated.  DATA REVIEWED: CHART IN EPIC  ROS: Negative unless specifically indicated above in HPI.    Current Outpatient Medications:  .  acetaminophen (TYLENOL) 500 MG tablet, Take 500 mg by mouth every 6 (six) hours as needed., Disp: , Rfl:  .  albuterol (PROAIR HFA) 108 (90 Base) MCG/ACT inhaler, Inhale 2 puffs into the lungs every 6 (six) hours as needed for wheezing or shortness of breath., Disp: 18 g, Rfl: 5 .  buPROPion (WELLBUTRIN XL) 150 MG 24 hr tablet, Take 150 mg by mouth daily., Disp: , Rfl:  .  clopidogrel (PLAVIX) 75 MG tablet, Take 1 tablet (75 mg total) by mouth daily. TAKE (1) TABLET BY MOUTH ONCE DAILY., Disp: 90 tablet, Rfl: 1 .  clotrimazole (LOTRIMIN) 1 % cream, Apply 1 application topically as needed., Disp: 60 g, Rfl: 0 .  cycloSPORINE (RESTASIS) 0.05 % ophthalmic emulsion, Apply to eye., Disp: , Rfl:  .  docusate sodium (COLACE) 100 MG capsule, Take 2 capsules (200 mg total) by mouth at bedtime. (Patient taking differently: Take 200 mg by mouth as needed.), Disp: , Rfl: 0 .  FLUoxetine (PROZAC) 40 MG capsule, Take 40 mg by mouth daily., Disp: , Rfl:  .  furosemide (LASIX) 20 MG tablet, Take 1 tablet (20 mg total) by mouth daily., Disp: 90 tablet, Rfl: 1 .  insulin aspart (NOVOLOG) 100 UNIT/ML injection, Inject into the skin 3 (three) times daily before meals. Per pump, Disp: , Rfl:  .  ketoconazole (NIZORAL) 2 % cream, Apply 1 application topically daily., Disp: 30 g, Rfl: 0 .  LORazepam (ATIVAN) 0.5 MG tablet, Take 0.5 mg by mouth. Takes 0.25mg  - 0.50mg , 1-2 time daily as needed., Disp: , Rfl:  .  medroxyPROGESTERone (PROVERA) 10 MG tablet, Take 10 mg by mouth See admin instructions. Reported on 12/26/2015, Disp: , Rfl:  .  nystatin ointment (MYCOSTATIN), Apply 1 application topically 3 (three) times daily as needed., Disp: , Rfl:  .  potassium chloride (KLOR-CON) 10 MEQ tablet, TAKE (1) TABLET TWICE DAILY., Disp: 60  tablet, Rfl: 0 .  valACYclovir (VALTREX) 500 MG tablet, Take 500 mg by mouth daily., Disp: , Rfl:    Allergies  Allergen Reactions  . Augmentin [Amoxicillin-Pot Clavulanate] Nausea Only    Causes severe diarrhea  . Invokana [Canagliflozin]   . Lopid [Gemfibrozil] Other (See Comments)    Burning tounge  . Naproxen Sodium Other (See Comments)    Patient states she felt loopy  . Other Other (See Comments)  . Statins Other (See Comments)    Joint pains  . Morphine And Related Itching and Rash   Past Medical History:  Diagnosis Date  . Allergy   . Anginal pain ([place])   . Anxiety   . Arthritis   . Asthma   . Bladder disorder, other   . Borderline personality disorder ([place])   . Carotid artery disease ([place]) 03/05/2017  . Depression   . GERD (gastroesophageal reflux disease)   . Headache   . Hyperlipidemia   . Hypertension   .  Kidney stones   . Neuromuscular disorder ([facility])    Neuropathy  . PTSD (post-traumatic stress disorder)   . Shortness of breath   . Sleep apnea   . Stroke ([place])   . Thyroid disease    nodule    Past Surgical History:  Procedure Laterality Date  . CARPAL TUNNEL RELEASE     left 2015  . CERVICAL FUSION Bilateral 2003   C 5-6 with titanium screws  . CHOLECYSTECTOMY    . COLONOSCOPY WITH PROPOFOL N/A 06/24/2014   Procedure: COLONOSCOPY WITH PROPOFOL;  Surgeon: Rogene Houston, MD;  Location: [facility];  Service: Endoscopy;  Laterality: N/A;  cecum time in 0810    time out   0821    total time 11 minutes  . POLYPECTOMY N/A 06/24/2014   Procedure: RECTAL POLYPECTOMY;  Surgeon: Rogene Houston, MD;  Location: [facility];  Service: Endoscopy;  Laterality: N/A;  . SPINE SURGERY    . URETHRAL DILATION  1981  . WISDOM TOOTH EXTRACTION Bilateral 2001 and 1990s   top and bottom     Social History   Socioeconomic History  . Marital status: Married    Spouse name: [name]  . Number of children: 0  . Years of education: HS  . Highest education level: Not on file   Occupational History  . Occupation: Disabled  Tobacco Use  . Smoking status: Never Smoker  . Smokeless tobacco: Never Used  Vaping Use  . Vaping Use: Never used  Substance and Sexual Activity  . Alcohol use: No    Comment: Rarely  . Drug use: No  . Sexual activity: Not Currently    Birth control/protection: Pill  Other Topics Concern  . Not on file  Social History Narrative   Lives at home with husband.   Right-handed.   3-4 cups caffeine per day.   Social Determinants of Health   Financial Resource Strain: Not on file  Food Insecurity: Not on file  Transportation Needs: Not on file  Physical Activity: Not on file  Stress: Not on file  Social Connections: Not on file  Intimate Partner Violence: Not on file        Objective:    BP (!) 145/69   Pulse 80   Temp 97.8 F (36.6 C) (Temporal)   Ht 5\' 6"  (1.676 m)   Wt (!) 348 lb (157.9 kg)   SpO2 97%   BMI 56.17 kg/m   Wt Readings from Last 3 Encounters:  09/08/20 (!) 348 lb (157.9 kg)  01/12/20 (!) 354 lb (160.6 kg)  03/17/19 (!) 350 lb 3.2 oz (158.8 kg)    Physical Exam Vitals reviewed.  Constitutional:      General: She is not in acute distress.    Appearance: Normal appearance. She is morbidly obese. She is not ill-appearing, toxic-appearing or diaphoretic.  HENT:     Head: Normocephalic and atraumatic.  Eyes:     General: No scleral icterus.       Right eye: No discharge.        Left eye: No discharge.     Conjunctiva/sclera: Conjunctivae normal.  Cardiovascular:     Rate and Rhythm: Normal rate and regular rhythm.     Heart sounds: Normal heart sounds. No murmur heard. No friction rub. No gallop.   Pulmonary:     Effort: Pulmonary effort is normal. No respiratory distress.     Breath sounds: Normal breath sounds. No stridor. No wheezing, rhonchi or rales.  Abdominal:  Comments: Abdominal pannus.  Musculoskeletal:        General: Normal range of motion.     Cervical back: Normal range of  motion.  Skin:    General: Skin is warm and dry.     Capillary Refill: Capillary refill takes less than 2 seconds.  Neurological:     General: No focal deficit present.     Mental Status: She is alert and oriented to person, place, and time. Mental status is at baseline.  Psychiatric:        Mood and Affect: Mood normal.        Behavior: Behavior normal.        Thought Content: Thought content normal.        Judgment: Judgment normal.     Lab Results  Component Value Date   TSH 2.320 12/18/2016   Lab Results  Component Value Date   WBC 10.0 07/01/2017   HGB 13.3 07/01/2017   HCT 39.9 07/01/2017   MCV 89 07/01/2017   PLT 421 (H) 07/01/2017   Lab Results  Component Value Date   NA 137 07/01/2017   K 4.2 07/01/2017   CO2 26 07/01/2017   GLUCOSE 202 (H) 07/01/2017   BUN 8 07/01/2017   CREATININE 0.65 07/01/2017   BILITOT <0.2 07/01/2017   ALKPHOS 108 07/01/2017   AST 21 07/01/2017   ALT 18 07/01/2017   PROT 6.9 07/01/2017   ALBUMIN 4.1 07/01/2017   CALCIUM 9.5 07/01/2017   ANIONGAP 12 06/17/2014   Lab Results  Component Value Date   CHOL 230 (H) 07/01/2017   Lab Results  Component Value Date   HDL 44 07/01/2017   Lab Results  Component Value Date   LDLCALC 119 (H) 07/01/2017   Lab Results  Component Value Date   TRIG 336 (H) 07/01/2017   Lab Results  Component Value Date   CHOLHDL 5.2 (H) 07/01/2017   Lab Results  Component Value Date   HGBA1C 9.0 (H) 07/01/2017

## 2020-09-12 DIAGNOSIS — E049 Nontoxic goiter, unspecified: Secondary | ICD-10-CM | POA: Insufficient documentation

## 2020-09-12 DIAGNOSIS — Z9641 Presence of insulin pump (external) (internal): Secondary | ICD-10-CM | POA: Insufficient documentation

## 2020-09-12 DIAGNOSIS — G609 Hereditary and idiopathic neuropathy, unspecified: Secondary | ICD-10-CM | POA: Insufficient documentation

## 2020-09-12 DIAGNOSIS — E041 Nontoxic single thyroid nodule: Secondary | ICD-10-CM | POA: Insufficient documentation

## 2020-09-12 NOTE — Progress Notes (Signed)
Chart has been updated.

## 2020-09-19 DIAGNOSIS — E109 Type 1 diabetes mellitus without complications: Secondary | ICD-10-CM | POA: Diagnosis not present

## 2020-09-26 ENCOUNTER — Ambulatory Visit (INDEPENDENT_AMBULATORY_CARE_PROVIDER_SITE_OTHER): Payer: BC Managed Care – PPO | Admitting: Nurse Practitioner

## 2020-09-26 ENCOUNTER — Telehealth: Payer: Self-pay

## 2020-09-26 ENCOUNTER — Encounter: Payer: Self-pay | Admitting: Nurse Practitioner

## 2020-09-26 DIAGNOSIS — R197 Diarrhea, unspecified: Secondary | ICD-10-CM | POA: Diagnosis not present

## 2020-09-26 DIAGNOSIS — R110 Nausea: Secondary | ICD-10-CM | POA: Insufficient documentation

## 2020-09-26 DIAGNOSIS — R0981 Nasal congestion: Secondary | ICD-10-CM

## 2020-09-26 MED ORDER — ONDANSETRON HCL 4 MG PO TABS: 4.0000 mg | ORAL_TABLET | Freq: Three times a day (TID) | ORAL | 0 refills | Status: DC | PRN

## 2020-09-26 MED ORDER — AZITHROMYCIN 250 MG PO TABS: ORAL_TABLET | ORAL | 0 refills | Status: DC

## 2020-09-26 MED ORDER — LOPERAMIDE HCL 2 MG PO TABS: 2.0000 mg | ORAL_TABLET | Freq: Four times a day (QID) | ORAL | 0 refills | Status: DC | PRN

## 2020-09-26 NOTE — Assessment & Plan Note (Signed)
Symptoms not well controlled.  Started patient on Zofran 4 mg tablet as needed for nausea.  Follow-up with worsening or unresolved symptoms.  Rx sent to pharmacy.

## 2020-09-26 NOTE — Assessment & Plan Note (Signed)
Symptoms not well controlled.  Encouraged to continue Tylenol for headache, Z-Pak ordered.  Rx sent to pharmacy.  Education provided.  Rx sent to pharmacy.  Follow-up with worsening or unresolved symptoms.

## 2020-09-26 NOTE — Patient Instructions (Signed)
Diarrhea, Adult Diarrhea is when you pass loose and watery poop (stool) often. Diarrhea can make you feel weak and cause you to lose water in your body (get dehydrated). Losing water in your body can cause you to:  Feel tired and thirsty.  Have a dry mouth.  Go pee (urinate) less often. Diarrhea often lasts 2-3 days. However, it can last longer if it is a sign of something more serious. It is important to treat your diarrhea as told by your doctor. Follow these instructions at home: Eating and drinking Follow these instructions as told by your doctor:  Take an ORS (oral rehydration solution). This is a drink that helps you replace fluids and minerals your body lost. It is sold at pharmacies and stores.  Drink plenty of fluids, such as: ? Water. ? Ice chips. ? Diluted fruit juice. ? Low-calorie sports drinks. ? Milk, if you want.  Avoid drinking fluids that have a lot of sugar or caffeine in them.  Eat bland, easy-to-digest foods in small amounts as you are able. These foods include: ? Bananas. ? Applesauce. ? Rice. ? Low-fat (lean) meats. ? Toast. ? Crackers.  Avoid alcohol.  Avoid spicy or fatty foods.      Medicines  Take over-the-counter and prescription medicines only as told by your doctor.  If you were prescribed an antibiotic medicine, take it as told by your doctor. Do not stop using the antibiotic even if you start to feel better. General instructions  Wash your hands often using soap and water. If soap and water are not available, use a hand sanitizer. Others in your home should wash their hands as well. Hands should be washed: ? After using the toilet or changing a diaper. ? Before preparing, cooking, or serving food. ? While caring for a sick person. ? While visiting someone in a hospital.  Drink enough fluid to keep your pee (urine) pale yellow.  Rest at home while you get better.  Watch your condition for any changes.  Take a warm bath to help  with any burning or pain from having diarrhea.  Keep all follow-up visits as told by your doctor. This is important.   Contact a doctor if:  You have a fever.  Your diarrhea gets worse.  You have new symptoms.  You cannot keep fluids down.  You feel light-headed or dizzy.  You have a headache.  You have muscle cramps. Get help right away if:  You have chest pain.  You feel very weak or you pass out (faint).  You have bloody or black poop or poop that looks like tar.  You have very bad pain, cramping, or bloating in your belly (abdomen).  You have trouble breathing or you are breathing very quickly.  Your heart is beating very quickly.  Your skin feels cold and clammy.  You feel confused.  You have signs of losing too much water in your body, such as: ? Dark pee, very little pee, or no pee. ? Cracked lips. ? Dry mouth. ? Sunken eyes. ? Sleepiness. ? Weakness. Summary  Diarrhea is when you pass loose and watery poop (stool) often.  Diarrhea can make you feel weak and cause you to lose water in your body (get dehydrated).  Take an ORS (oral rehydration solution). This is a drink that is sold at pharmacies and stores.  Eat bland, easy-to-digest foods in small amounts as you are able.  Contact a doctor if your condition gets worse. Get help   right away if you have signs that you have lost too much water in your body. This information is not intended to replace advice given to you by your health care provider. Make sure you discuss any questions you have with your health care provider. Document Revised: 01/10/2018 Document Reviewed: 01/10/2018 Elsevier Patient Education  2021 [employer] [place]. Sinusitis, Adult Sinusitis is inflammation of your sinuses. Sinuses are hollow spaces in the bones around your face. Your sinuses are located:  Around your eyes.  In the middle of your forehead.  Behind your nose.  In your cheekbones. Mucus normally drains out of your  sinuses. When your nasal tissues become inflamed or swollen, mucus can become trapped or blocked. This allows bacteria, viruses, and fungi to grow, which leads to infection. Most infections of the sinuses are caused by a virus. Sinusitis can develop quickly. It can last for up to 4 weeks (acute) or for more than 12 weeks (chronic). Sinusitis often develops after a cold. What are the causes? This condition is caused by anything that creates swelling in the sinuses or stops mucus from draining. This includes:  Allergies.  Asthma.  Infection from bacteria or viruses.  Deformities or blockages in your nose or sinuses.  Abnormal growths in the nose (nasal polyps).  Pollutants, such as chemicals or irritants in the air.  Infection from fungi (rare). What increases the risk? You are more likely to develop this condition if you:  Have a weak body defense system (immune system).  Do a lot of swimming or diving.  Overuse nasal sprays.  Smoke. What are the signs or symptoms? The main symptoms of this condition are pain and a feeling of pressure around the affected sinuses. Other symptoms include:  Stuffy nose or congestion.  Thick drainage from your nose.  Swelling and warmth over the affected sinuses.  Headache.  Upper toothache.  A cough that may get worse at night.  Extra mucus that collects in the throat or the back of the nose (postnasal drip).  Decreased sense of smell and taste.  Fatigue.  A fever.  Sore throat.  Bad breath. How is this diagnosed? This condition is diagnosed based on:  Your symptoms.  Your medical history.  A physical exam.  Tests to find out if your condition is acute or chronic. This may include: ? Checking your nose for nasal polyps. ? Viewing your sinuses using a device that has a light (endoscope). ? Testing for allergies or bacteria. ? Imaging tests, such as an MRI or CT scan. In rare cases, a bone biopsy may be done to rule out  more serious types of fungal sinus disease. How is this treated? Treatment for sinusitis depends on the cause and whether your condition is chronic or acute.  If caused by a virus, your symptoms should go away on their own within 10 days. You may be given medicines to relieve symptoms. They include: ? Medicines that shrink swollen nasal passages (topical intranasal decongestants). ? Medicines that treat allergies (antihistamines). ? A spray that eases inflammation of the nostrils (topical intranasal corticosteroids). ? Rinses that help get rid of thick mucus in your nose (nasal saline washes).  If caused by bacteria, your health care provider may recommend waiting to see if your symptoms improve. Most bacterial infections will get better without antibiotic medicine. You may be given antibiotics if you have: ? A severe infection. ? A weak immune system.  If caused by narrow nasal passages or nasal polyps, you may  need to have surgery. Follow these instructions at home: Medicines  Take, use, or apply over-the-counter and prescription medicines only as told by your health care provider. These may include nasal sprays.  If you were prescribed an antibiotic medicine, take it as told by your health care provider. Do not stop taking the antibiotic even if you start to feel better. Hydrate and humidify  Drink enough fluid to keep your urine pale yellow. Staying hydrated will help to thin your mucus.  Use a cool mist humidifier to keep the humidity level in your home above 50%.  Inhale steam for 10-15 minutes, 3-4 times a day, or as told by your health care provider. You can do this in the bathroom while a hot shower is running.  Limit your exposure to cool or dry air.   Rest  Rest as much as possible.  Sleep with your head raised (elevated).  Make sure you get enough sleep each night. General instructions  Apply a warm, moist washcloth to your face 3-4 times a day or as told by your  health care provider. This will help with discomfort.  Wash your hands often with soap and water to reduce your exposure to germs. If soap and water are not available, use hand sanitizer.  Do not smoke. Avoid being around people who are smoking (secondhand smoke).  Keep all follow-up visits as told by your health care provider. This is important.   Contact a health care provider if:  You have a fever.  Your symptoms get worse.  Your symptoms do not improve within 10 days. Get help right away if:  You have a severe headache.  You have persistent vomiting.  You have severe pain or swelling around your face or eyes.  You have vision problems.  You develop confusion.  Your neck is stiff.  You have trouble breathing. Summary  Sinusitis is soreness and inflammation of your sinuses. Sinuses are hollow spaces in the bones around your face.  This condition is caused by nasal tissues that become inflamed or swollen. The swelling traps or blocks the flow of mucus. This allows bacteria, viruses, and fungi to grow, which leads to infection.  If you were prescribed an antibiotic medicine, take it as told by your health care provider. Do not stop taking the antibiotic even if you start to feel better.  Keep all follow-up visits as told by your health care provider. This is important. This information is not intended to replace advice given to you by your health care provider. Make sure you discuss any questions you have with your health care provider. Document Revised: 01/06/2018 Document Reviewed: 01/06/2018 Elsevier Patient Education  2021 [employer].

## 2020-09-26 NOTE — Progress Notes (Signed)
Virtual Visit via telephone Note Due to COVID-19 pandemic this visit was conducted virtually. This visit type was conducted due to national recommendations for restrictions regarding the COVID-19 Pandemic (e.g. social distancing, sheltering in place) in an effort to limit this patient's exposure and mitigate transmission in our community. All issues noted in this document were discussed and addressed.  A physical exam was not performed with this format.  I connected with Kathryn Jimenez on 09/26/20 at  8:40 AM by telephone and verified that I am speaking with the correct person using two identifiers. Kathryn Jimenez is currently located at during visit. The provider, Ivy Lynn, NP is located in their office at time of visit.  I discussed the limitations, risks, security and privacy concerns of performing an evaluation and management service by telephone and the availability of in person appointments. I also discussed with the patient that there may be a patient responsible charge related to this service. The patient expressed understanding and agreed to proceed.   History and Present Illness:  Diarrhea  This is a recurrent problem. The current episode started in the past 7 days. The problem occurs 2 to 4 times per day. The problem has been unchanged. The patient states that diarrhea does not awaken her from sleep. Associated symptoms include abdominal pain and headaches. Pertinent negatives include no vomiting. Nothing aggravates the symptoms. Risk factors include ill contacts. She has tried increased fluids for the symptoms. The treatment provided mild relief. There is no history of bowel resection, inflammatory bowel disease or irritable bowel syndrome. Positive Covid -19  Sore Throat  This is a new problem. The current episode started in the past 7 days. The problem has been rapidly improving. Neither side of throat is experiencing more pain than the other. There has been no fever. The pain is  moderate. Associated symptoms include abdominal pain, congestion, diarrhea and headaches. Pertinent negatives include no vomiting. She has tried nothing for the symptoms.      Review of Systems  HENT: Positive for congestion.   Gastrointestinal: Positive for abdominal pain and diarrhea. Negative for vomiting.  Neurological: Positive for headaches.     Observations/Objective: Nausea Symptoms not well controlled.  Started patient on Zofran 4 mg tablet as needed for nausea.  Follow-up with worsening or unresolved symptoms.  Rx sent to pharmacy.  Head congestion Symptoms not well controlled.  Encouraged to continue Tylenol for headache, Z-Pak ordered.  Rx sent to pharmacy.  Education provided.  Rx sent to pharmacy.  Follow-up with worsening or unresolved symptoms.  Diarrhea Diarrhea symptoms new for patient in the last 2 to 3 days.  Advised patient to increase hydration, Imodium 4 mg tablet every 4 hours as needed for nausea ordered.  Advised patient to start brat diet.  Increase hydration replace electrolyte. Patient verbalized understanding.  Rx sent to pharmacy.  Follow-up with worsening or unresolved symptoms.   Assessment and Plan: Televisit.  Patient did not sound to be in distress.  Follow Up Instructions: Follow-up with worsening or unresolved symptoms.    I discussed the assessment and treatment plan with the patient. The patient was provided an opportunity to ask questions and all were answered. The patient agreed with the plan and demonstrated an understanding of the instructions.   The patient was advised to call back or seek an in-person evaluation if the symptoms worsen or if the condition fails to improve as anticipated.  The above assessment and management plan was discussed with the patient. The  patient verbalized understanding of and has agreed to the management plan. Patient is aware to call the clinic if symptoms persist or worsen. Patient is aware when to return to  the clinic for a follow-up visit. Patient educated on when it is appropriate to go to the emergency department.   Time call ended: 8:50 AM  I provided 10 minutes of non-face-to-face time during this encounter.    Ivy Lynn, NP

## 2020-09-26 NOTE — Telephone Encounter (Signed)
Please notify patient to seek emergency care with worsening symptoms.  Monitor oxygen saturation and keep sats above 91%.  Continue to follow-up with worsening or unresolved symptoms.

## 2020-09-26 NOTE — Assessment & Plan Note (Signed)
Diarrhea symptoms new for patient in the last 2 to 3 days.  Advised patient to increase hydration, Imodium 4 mg tablet every 4 hours as needed for nausea ordered.  Advised patient to start brat diet.  Increase hydration replace electrolyte. Patient verbalized understanding.  Rx sent to pharmacy.  Follow-up with worsening or unresolved symptoms.

## 2020-09-27 ENCOUNTER — Encounter: Payer: Self-pay | Admitting: Family Medicine

## 2020-09-27 NOTE — Telephone Encounter (Signed)
Patient aware and verbalized understanding. °

## 2020-10-03 ENCOUNTER — Telehealth: Payer: Self-pay

## 2020-10-03 DIAGNOSIS — R32 Unspecified urinary incontinence: Secondary | ICD-10-CM

## 2020-10-03 NOTE — Telephone Encounter (Signed)
Pt needs rx for incontinence briefs. Send to [facility]

## 2020-10-04 MED ORDER — PREVAIL IB FULL MAT BRIEF 2XL MISC: 1.0000 | 5 refills | Status: DC | PRN

## 2020-10-04 NOTE — Telephone Encounter (Signed)
Sent via Epic

## 2020-10-05 DIAGNOSIS — N393 Stress incontinence (female) (male): Secondary | ICD-10-CM | POA: Diagnosis not present

## 2020-10-20 DIAGNOSIS — E109 Type 1 diabetes mellitus without complications: Secondary | ICD-10-CM | POA: Diagnosis not present

## 2020-11-02 ENCOUNTER — Other Ambulatory Visit: Payer: Self-pay | Admitting: Family Medicine

## 2020-11-02 DIAGNOSIS — I10 Essential (primary) hypertension: Secondary | ICD-10-CM

## 2020-11-27 ENCOUNTER — Other Ambulatory Visit: Payer: Self-pay | Admitting: Nurse Practitioner

## 2020-11-28 ENCOUNTER — Telehealth: Payer: Self-pay

## 2020-11-28 NOTE — Telephone Encounter (Signed)
Error

## 2020-12-01 DIAGNOSIS — E109 Type 1 diabetes mellitus without complications: Secondary | ICD-10-CM | POA: Diagnosis not present

## 2020-12-20 DIAGNOSIS — F329 Major depressive disorder, single episode, unspecified: Secondary | ICD-10-CM | POA: Diagnosis not present

## 2020-12-26 DIAGNOSIS — Z01419 Encounter for gynecological examination (general) (routine) without abnormal findings: Secondary | ICD-10-CM | POA: Diagnosis not present

## 2020-12-26 DIAGNOSIS — N763 Subacute and chronic vulvitis: Secondary | ICD-10-CM | POA: Diagnosis not present

## 2020-12-26 DIAGNOSIS — E282 Polycystic ovarian syndrome: Secondary | ICD-10-CM | POA: Diagnosis not present

## 2020-12-30 DIAGNOSIS — I10 Essential (primary) hypertension: Secondary | ICD-10-CM | POA: Diagnosis not present

## 2020-12-30 DIAGNOSIS — E1165 Type 2 diabetes mellitus with hyperglycemia: Secondary | ICD-10-CM | POA: Diagnosis not present

## 2020-12-30 DIAGNOSIS — G609 Hereditary and idiopathic neuropathy, unspecified: Secondary | ICD-10-CM | POA: Diagnosis not present

## 2020-12-30 DIAGNOSIS — E669 Obesity, unspecified: Secondary | ICD-10-CM | POA: Diagnosis not present

## 2020-12-30 LAB — HEMOGLOBIN A1C: Hemoglobin A1C: 6.9

## 2021-01-17 DIAGNOSIS — E109 Type 1 diabetes mellitus without complications: Secondary | ICD-10-CM | POA: Diagnosis not present

## 2021-02-09 DIAGNOSIS — N939 Abnormal uterine and vaginal bleeding, unspecified: Secondary | ICD-10-CM | POA: Diagnosis not present

## 2021-02-09 DIAGNOSIS — R9389 Abnormal findings on diagnostic imaging of other specified body structures: Secondary | ICD-10-CM | POA: Diagnosis not present

## 2021-02-09 DIAGNOSIS — N920 Excessive and frequent menstruation with regular cycle: Secondary | ICD-10-CM | POA: Diagnosis not present

## 2021-02-09 DIAGNOSIS — E119 Type 2 diabetes mellitus without complications: Secondary | ICD-10-CM | POA: Diagnosis not present

## 2021-02-09 DIAGNOSIS — F4310 Post-traumatic stress disorder, unspecified: Secondary | ICD-10-CM | POA: Diagnosis not present

## 2021-02-09 DIAGNOSIS — Z800 Family history of malignant neoplasm of digestive organs: Secondary | ICD-10-CM | POA: Diagnosis not present

## 2021-02-09 DIAGNOSIS — Z8673 Personal history of transient ischemic attack (TIA), and cerebral infarction without residual deficits: Secondary | ICD-10-CM | POA: Diagnosis not present

## 2021-02-09 DIAGNOSIS — Z6841 Body Mass Index (BMI) 40.0 and over, adult: Secondary | ICD-10-CM | POA: Diagnosis not present

## 2021-02-09 DIAGNOSIS — D259 Leiomyoma of uterus, unspecified: Secondary | ICD-10-CM | POA: Diagnosis not present

## 2021-02-09 DIAGNOSIS — Z803 Family history of malignant neoplasm of breast: Secondary | ICD-10-CM | POA: Diagnosis not present

## 2021-02-09 DIAGNOSIS — Z981 Arthrodesis status: Secondary | ICD-10-CM | POA: Diagnosis not present

## 2021-02-09 DIAGNOSIS — N393 Stress incontinence (female) (male): Secondary | ICD-10-CM | POA: Diagnosis not present

## 2021-02-09 DIAGNOSIS — G4730 Sleep apnea, unspecified: Secondary | ICD-10-CM | POA: Diagnosis not present

## 2021-02-09 DIAGNOSIS — Z801 Family history of malignant neoplasm of trachea, bronchus and lung: Secondary | ICD-10-CM | POA: Diagnosis not present

## 2021-02-09 DIAGNOSIS — I10 Essential (primary) hypertension: Secondary | ICD-10-CM | POA: Diagnosis not present

## 2021-02-15 DIAGNOSIS — R5383 Other fatigue: Secondary | ICD-10-CM | POA: Diagnosis not present

## 2021-02-15 DIAGNOSIS — N921 Excessive and frequent menstruation with irregular cycle: Secondary | ICD-10-CM | POA: Diagnosis not present

## 2021-02-16 DIAGNOSIS — E109 Type 1 diabetes mellitus without complications: Secondary | ICD-10-CM | POA: Diagnosis not present

## 2021-02-23 ENCOUNTER — Ambulatory Visit: Payer: BC Managed Care – PPO | Admitting: Family Medicine

## 2021-03-07 ENCOUNTER — Ambulatory Visit (INDEPENDENT_AMBULATORY_CARE_PROVIDER_SITE_OTHER): Payer: BC Managed Care – PPO | Admitting: Family Medicine

## 2021-03-07 ENCOUNTER — Other Ambulatory Visit: Payer: Self-pay

## 2021-03-07 ENCOUNTER — Encounter: Payer: Self-pay | Admitting: Family Medicine

## 2021-03-07 VITALS — BP 184/94 | HR 79 | Temp 97.3°F | Ht 66.0 in | Wt 335.0 lb

## 2021-03-07 DIAGNOSIS — I679 Cerebrovascular disease, unspecified: Secondary | ICD-10-CM | POA: Diagnosis not present

## 2021-03-07 DIAGNOSIS — R319 Hematuria, unspecified: Secondary | ICD-10-CM

## 2021-03-07 DIAGNOSIS — E1142 Type 2 diabetes mellitus with diabetic polyneuropathy: Secondary | ICD-10-CM

## 2021-03-07 DIAGNOSIS — E559 Vitamin D deficiency, unspecified: Secondary | ICD-10-CM

## 2021-03-07 DIAGNOSIS — Z0000 Encounter for general adult medical examination without abnormal findings: Secondary | ICD-10-CM

## 2021-03-07 DIAGNOSIS — R8290 Unspecified abnormal findings in urine: Secondary | ICD-10-CM | POA: Diagnosis not present

## 2021-03-07 DIAGNOSIS — Z0001 Encounter for general adult medical examination with abnormal findings: Secondary | ICD-10-CM | POA: Diagnosis not present

## 2021-03-07 DIAGNOSIS — N921 Excessive and frequent menstruation with irregular cycle: Secondary | ICD-10-CM

## 2021-03-07 DIAGNOSIS — B372 Candidiasis of skin and nail: Secondary | ICD-10-CM | POA: Diagnosis not present

## 2021-03-07 DIAGNOSIS — E7800 Pure hypercholesterolemia, unspecified: Secondary | ICD-10-CM | POA: Diagnosis not present

## 2021-03-07 DIAGNOSIS — I10 Essential (primary) hypertension: Secondary | ICD-10-CM | POA: Diagnosis not present

## 2021-03-07 DIAGNOSIS — Z794 Long term (current) use of insulin: Secondary | ICD-10-CM

## 2021-03-07 DIAGNOSIS — E049 Nontoxic goiter, unspecified: Secondary | ICD-10-CM

## 2021-03-07 DIAGNOSIS — E041 Nontoxic single thyroid nodule: Secondary | ICD-10-CM

## 2021-03-07 LAB — URINALYSIS, ROUTINE W REFLEX MICROSCOPIC
Bilirubin, UA: NEGATIVE
Leukocytes,UA: NEGATIVE
Nitrite, UA: POSITIVE — AB
Specific Gravity, UA: 1.025 (ref 1.005–1.030)
Urobilinogen, Ur: 4 mg/dL — ABNORMAL HIGH (ref 0.2–1.0)
pH, UA: 6 (ref 5.0–7.5)

## 2021-03-07 LAB — MICROSCOPIC EXAMINATION
RBC, Urine: >30 /hpf — AB (ref 0–2)
WBC, UA: NONE SEEN /hpf (ref 0–5)

## 2021-03-07 MED ORDER — LOSARTAN POTASSIUM 25 MG PO TABS: 25.0000 mg | ORAL_TABLET | Freq: Every day | ORAL | 2 refills | Status: DC

## 2021-03-07 MED ORDER — CLOTRIMAZOLE 1 % EX CREA: 1.0000 "application " | TOPICAL_CREAM | Freq: Two times a day (BID) | CUTANEOUS | 2 refills | Status: DC

## 2021-03-07 MED ORDER — FUROSEMIDE 20 MG PO TABS: 20.0000 mg | ORAL_TABLET | Freq: Every day | ORAL | 1 refills | Status: DC

## 2021-03-07 MED ORDER — CLOPIDOGREL BISULFATE 75 MG PO TABS: 75.0000 mg | ORAL_TABLET | Freq: Every day | ORAL | 1 refills | Status: DC

## 2021-03-07 MED ORDER — POTASSIUM CHLORIDE ER 10 MEQ PO TBCR: 10.0000 meq | EXTENDED_RELEASE_TABLET | Freq: Two times a day (BID) | ORAL | 1 refills | Status: DC

## 2021-03-07 NOTE — Progress Notes (Signed)
Assessment & Plan:  1. Well adult exam Preventive health education provided.  Colonoscopy record requested from Dr. Laural Golden.  Patient declined pneumonia and COVID vaccines, as well as hepatitis C screening.  States she will make an appointment for her mammogram at a later time. - Anemia Profile B - CMP14+EGFR - Lipid panel  2. Essential hypertension Uncontrolled.  Started patient on losartan 25 mg once daily. - potassium chloride (KLOR-CON) 10 MEQ tablet; Take 1 tablet (10 mEq total) by mouth 2 (two) times daily.  Dispense: 180 tablet; Refill: 1 - furosemide (LASIX) 20 MG tablet; Take 1 tablet (20 mg total) by mouth daily.  Dispense: 90 tablet; Refill: 1 - losartan (COZAAR) 25 MG tablet; Take 1 tablet (25 mg total) by mouth daily.  Dispense: 30 tablet; Refill: 2 - Anemia Profile B - CMP14+EGFR - Lipid panel  3. High cholesterol Labs to assess.  Discussed the addition of Zetia if needed as she is intolerant to statins. - Lipid panel  4. Small vessel disease, cerebrovascular Well controlled on current regimen.  Intolerant to statins. - clopidogrel (PLAVIX) 75 MG tablet; Take 1 tablet (75 mg total) by mouth daily.  Dispense: 90 tablet; Refill: 1 - Lipid panel  5. Candidal intertrigo Well controlled on current regimen.  - clotrimazole (LOTRIMIN) 1 % cream; Apply 1 application topically 2 (two) times daily.  Dispense: 60 g; Refill: 2  6. Vitamin D deficiency Labs to assess. - VITAMIN D 25 Hydroxy (Vit-D Deficiency, Fractures)  7. Type 2 diabetes mellitus with diabetic polyneuropathy, with long-term current use of insulin ([place]) Managed by Dr. Chalmers Cater; records requested.  Patient reports an A1c of 6.9.  8. Goiter Patient reports her endocrinologist has ordered a thyroid ultrasound.  9. Nontoxic single thyroid nodule Patient reports her endocrinologist has ordered a thyroid ultrasound.  10. Menorrhagia with irregular cycle - norethindrone (AYGESTIN) 5 MG tablet; Take 5 mg by mouth  daily. - Anemia Profile B - Ambulatory referral to Gynecology  11. Hematuria, unspecified type - Urinalysis, Routine w reflex microscopic - Microscopic Examination  12. Abnormal urinalysis - Urine Culture   Follow-up: Return in about 6 weeks (around 04/18/2021) for HTN.   Hendricks Limes, MSN, APRN, FNP-C [facility]  Subjective:  Patient ID: Kathryn Jimenez, female    DOB: 08/06/1971  Age: 50 y.o. MRN: [id number]  Patient Care Team: Loman Brooklyn, FNP as PCP - General (Family Medicine) Jacelyn Pi, MD as Consulting Physician (Endocrinology) Rogene Houston, MD as Consulting Physician (Gastroenterology) Sandford Craze, MD as Consulting Physician (Dermatology) Case, Reche Dixon, MD as Consulting Physician (Orthopedic Surgery)   CC:  Chief Complaint  Patient presents with   Annual Exam   Vaginal Bleeding    Patient states she has been spotting since June 9th.  Now she has a heavier flow.  Is following up with OBGYN. Collected UA since patient states she had a uti while bleeding     HPI Kathryn Jimenez presents for her annual physical.  Occupation: Disabled, Marital status: Married, Substance use: None Diet: Low-carb, Exercise: Walking around the house and to the mailbox Last eye exam: July 2021 Last dental exam: A few years ago Last colonoscopy: 06/24/2014 with Dr. Laural Golden - due to repeat in 5 years Last mammogram: 11/28/2016 Last pap smear: 11/24/2019 Hepatitis C Screening: Declined Immunizations: Flu Vaccine:  Not flu season Tdap Vaccine: up to date  COVID-19 Vaccine: declined Pneumonia Vaccine: declined  Patient has been having trouble with heavy vaginal bleeding.  This  is being managed by her OB/GYN.  She has already taken Provera which did significantly reduce the bleeding, but then it returned.  She is currently taking Aygestin and was advised to follow-up weekly.  It appears after 2 weeks of therapy they plan to increase her dose if bleeding is  not improved.  She has had a transabdominal/transvaginal pelvic ultrasound with the following impression: 1. Endometrial thickness of 11 mm with a small amount of fluid and probable blood products in the endometrial canal. If bleeding remains unresponsive to hormonal or medical therapy, sonohysterogram should be considered for focal lesion work-up. (Ref: Radiological Reasoning: Algorithmic Workup of Abnormal Vaginal Bleeding with Endovaginal Sonography and Sonohysterography. AJR 2008; [id number])  2. Two small uterine fibroids.  3. Unremarkable right ovary.  4. Nonvisualization of the left ovary.   Patient states she needs a referral to a GYN within [facility] so that she does not have to go to [place] or [place] to have this procedure completed since her GYN is with [facility].  Hypertension: Patient does not check her blood pressure at home.  She states that she has previously failed treatment with amlodipine, lisinopril, HCTZ, and metoprolol as they all caused unwanted side effects.  Hyperlipidemia: Patient reports she is intolerant to statins.  She has never tried Zetia.  She is fasting for her lab work today.   Review of Systems  Constitutional:  Negative for chills, fever, malaise/fatigue and weight loss.  HENT:  Negative for congestion, ear discharge, ear pain, nosebleeds, sinus pain, sore throat and tinnitus.   Eyes:  Negative for blurred vision, double vision, pain, discharge and redness.  Respiratory:  Negative for cough, shortness of breath and wheezing.   Cardiovascular:  Negative for chest pain, palpitations and leg swelling.  Gastrointestinal:  Positive for abdominal pain and constipation. Negative for diarrhea, heartburn, nausea and vomiting.  Genitourinary:  Positive for hematuria. Negative for dysuria, frequency and urgency.  Musculoskeletal:  Negative for myalgias.  Neurological:  Negative for dizziness, seizures, weakness and headaches.  Psychiatric/Behavioral:  Negative for  substance abuse and suicidal ideas. The patient is not nervous/anxious.     Current Outpatient Medications:    acetaminophen (TYLENOL) 500 MG tablet, Take 500 mg by mouth every 6 (six) hours as needed., Disp: , Rfl:    albuterol (PROAIR HFA) 108 (90 Base) MCG/ACT inhaler, Inhale 2 puffs into the lungs every 6 (six) hours as needed for wheezing or shortness of breath., Disp: 18 g, Rfl: 5   buPROPion (WELLBUTRIN XL) 150 MG 24 hr tablet, Take 150 mg by mouth daily., Disp: , Rfl:    clopidogrel (PLAVIX) 75 MG tablet, Take 1 tablet (75 mg total) by mouth daily. TAKE (1) TABLET BY MOUTH ONCE DAILY., Disp: 90 tablet, Rfl: 1   clotrimazole (LOTRIMIN) 1 % cream, Apply 1 application topically as needed., Disp: 60 g, Rfl: 0   cycloSPORINE (RESTASIS) 0.05 % ophthalmic emulsion, Apply to eye., Disp: , Rfl:    docusate sodium (COLACE) 100 MG capsule, Take 2 capsules (200 mg total) by mouth at bedtime. (Patient taking differently: Take 200 mg by mouth as needed.), Disp: , Rfl: 0   FLUoxetine (PROZAC) 40 MG capsule, Take 40 mg by mouth daily., Disp: , Rfl:    furosemide (LASIX) 20 MG tablet, Take 1 tablet (20 mg total) by mouth daily., Disp: 90 tablet, Rfl: 1   Incontinence Supply Disposable (PREVAIL IB FULL MAT BRIEF 2XL) MISC, 1 each by Does not apply route every 2 (two) hours as  needed., Disp: 96 each, Rfl: 5   insulin aspart (NOVOLOG) 100 UNIT/ML injection, Inject into the skin 3 (three) times daily before meals. Per pump, Disp: , Rfl:    ketoconazole (NIZORAL) 2 % cream, Apply 1 application topically daily., Disp: 30 g, Rfl: 0   loperamide (IMODIUM A-D) 2 MG tablet, Take 1 tablet (2 mg total) by mouth 4 (four) times daily as needed for diarrhea or loose stools., Disp: 30 tablet, Rfl: 0   LORazepam (ATIVAN) 0.5 MG tablet, Take 0.5 mg by mouth. Takes 0.39m - 0.536m 1-2 time daily as needed., Disp: , Rfl:    norethindrone (AYGESTIN) 5 MG tablet, Take 5 mg by mouth daily., Disp: , Rfl:    nystatin ointment  (MYCOSTATIN), Apply 1 application topically 3 (three) times daily as needed., Disp: , Rfl:    potassium chloride (KLOR-CON) 10 MEQ tablet, TAKE (1) TABLET TWICE DAILY., Disp: 180 tablet, Rfl: 1   valACYclovir (VALTREX) 500 MG tablet, Take 500 mg by mouth daily., Disp: , Rfl:   Allergies  Allergen Reactions   Actos [Pioglitazone] Swelling   Augmentin [Amoxicillin-Pot Clavulanate] Nausea Only    Causes severe diarrhea   Invokana [Canagliflozin]    Lopid [Gemfibrozil] Other (See Comments)    Burning tounge   Metoprolol Other (See Comments)    headaches   Naproxen Sodium Other (See Comments)    Patient states she felt loopy   Other Other (See Comments)   Statins Other (See Comments)    Joint pains Joint pains   Morphine And Related Itching and Rash    Past Medical History:  Diagnosis Date   Allergy    Anginal pain (HCC)    Anxiety    Arthritis    Asthma    Bladder disorder, other    Borderline personality disorder ([facility]   Carotid artery disease ([facility]7/17/2018   Depression    GERD (gastroesophageal reflux disease)    Headache    Hyperlipidemia    Hypertension    Kidney stones    Neuromuscular disorder (HCC)    Neuropathy   PTSD (post-traumatic stress disorder)    Shortness of breath    Sleep apnea    [facility]   Thyroid disease    nodule    Past Surgical History:  Procedure Laterality Date   CARPAL TUNNEL RELEASE     left 2015   CERVICAL FUSION Bilateral 2003   C 5-6 with titanium screws   CHOLECYSTECTOMY     COLONOSCOPY WITH PROPOFOL N/A 06/24/2014   Procedure: COLONOSCOPY WITH PROPOFOL;  Surgeon: NaRogene HoustonMD;  Location: [facility];  Service: Endoscopy;  Laterality: N/A;  cecum time in 0810    time out   0821    total time 11 minutes   POLYPECTOMY N/A 06/24/2014   Procedure: RECTAL POLYPECTOMY;  Surgeon: NaRogene HoustonMD;  Location: [facility];  Service: Endoscopy;  Laterality: N/A;   SPINE SURGERY     URETHRAL DILATION  1981   WISDOM TOOTH EXTRACTION  Bilateral 2001 and 1990s   top and bottom     Family History  Problem Relation Age of Onset   COPD Mother    Arthritis Mother    Asthma Mother    Depression Mother    Diabetes Mother    Heart disease Mother    Hyperlipidemia Mother    Hypertension Mother    Mental illness Mother    Skin cancer Mother    Lung cancer Father  Arthritis Father    Depression Father    Drug abuse Father    Early death Father    Kidney disease Father    Mental illness Father    Colon cancer Sister    Birth defects Sister    COPD Sister    Hyperlipidemia Sister    Learning disabilities Sister    Breast cancer Sister    Breast cancer Sister    Colon cancer Sister    Alcohol abuse Paternal Grandmother    Diabetes Paternal Grandmother    Alcohol abuse Paternal Grandfather     Social History   Socioeconomic History   Marital status: Married    Spouse name: [name]   Number of children: 0   Years of education: HS   Highest education level: Not on file  Occupational History   Occupation: Disabled  Tobacco Use   Smoking status: Never   Smokeless tobacco: Never  Vaping Use   Vaping Use: Never used  Substance and Sexual Activity   Alcohol use: No    Comment: Rarely   Drug use: No   Sexual activity: Not Currently    Birth control/protection: Pill  Other Topics Concern   Not on file  Social History Narrative   Lives at home with husband.   Right-handed.   3-4 cups caffeine per day.   Social Determinants of Health   Financial Resource Strain: Not on file  Food Insecurity: Not on file  Transportation Needs: Not on file  Physical Activity: Not on file  Stress: Not on file  Social Connections: Not on file  Intimate Partner Violence: Not on file      Objective:    BP (!) 184/94   Pulse 79   Temp (!) 97.3 F (36.3 C) (Temporal)   Ht 5' 6"  (1.676 m)   Wt (!) 335 lb (152 kg)   SpO2 97%   BMI 54.07 kg/m   Wt Readings from Last 3 Encounters:  03/07/21 (!) 335 lb (152 kg)   09/08/20 (!) 348 lb (157.9 kg)  01/12/20 (!) 354 lb (160.6 kg)    Physical Exam Vitals reviewed.  Constitutional:      General: She is not in acute distress.    Appearance: Normal appearance. She is morbidly obese. She is not ill-appearing, toxic-appearing or diaphoretic.  HENT:     Head: Normocephalic and atraumatic.     Right Ear: Tympanic membrane, ear canal and external ear normal. There is no impacted cerumen.     Left Ear: Tympanic membrane, ear canal and external ear normal. There is no impacted cerumen.     Nose: Nose normal. No congestion or rhinorrhea.     Mouth/Throat:     Mouth: Mucous membranes are moist.     Pharynx: Oropharynx is clear. No oropharyngeal exudate or posterior oropharyngeal erythema.  Eyes:     General: No scleral icterus.       Right eye: No discharge.        Left eye: No discharge.     Conjunctiva/sclera: Conjunctivae normal.     Pupils: Pupils are equal, round, and reactive to light.  Cardiovascular:     Rate and Rhythm: Normal rate and regular rhythm.     Heart sounds: Normal heart sounds. No murmur heard.   No friction rub. No gallop.  Pulmonary:     Effort: Pulmonary effort is normal. No respiratory distress.     Breath sounds: Normal breath sounds. No stridor. No wheezing, rhonchi or rales.  Abdominal:  General: Abdomen is flat. Bowel sounds are normal. There is no distension.     Palpations: Abdomen is soft. There is no hepatomegaly, splenomegaly or mass.     Tenderness: There is generalized abdominal tenderness. There is no guarding or rebound.     Hernia: No hernia is present.     Comments: Abdominal pannus.  Musculoskeletal:        General: Normal range of motion.     Cervical back: Normal range of motion and neck supple. No rigidity. No muscular tenderness.  Lymphadenopathy:     Cervical: No cervical adenopathy.  Skin:    General: Skin is warm and dry.     Capillary Refill: Capillary refill takes less than 2 seconds.   Neurological:     General: No focal deficit present.     Mental Status: She is alert and oriented to person, place, and time. Mental status is at baseline.  Psychiatric:        Mood and Affect: Mood normal.        Behavior: Behavior normal.        Thought Content: Thought content normal.        Judgment: Judgment normal.    Lab Results  Component Value Date   TSH 2.320 12/18/2016   Lab Results  Component Value Date   WBC 10.0 07/01/2017   HGB 13.3 07/01/2017   HCT 39.9 07/01/2017   MCV 89 07/01/2017   PLT 421 (H) 07/01/2017   Lab Results  Component Value Date   NA 137 07/01/2017   K 4.2 07/01/2017   CO2 26 07/01/2017   GLUCOSE 202 (H) 07/01/2017   BUN 8 07/01/2017   CREATININE 0.65 07/01/2017   BILITOT <0.2 07/01/2017   ALKPHOS 108 07/01/2017   AST 21 07/01/2017   ALT 18 07/01/2017   PROT 6.9 07/01/2017   ALBUMIN 4.1 07/01/2017   CALCIUM 9.5 07/01/2017   ANIONGAP 12 06/17/2014   Lab Results  Component Value Date   CHOL 230 (H) 07/01/2017   Lab Results  Component Value Date   HDL 44 07/01/2017   Lab Results  Component Value Date   LDLCALC 119 (H) 07/01/2017   Lab Results  Component Value Date   TRIG 336 (H) 07/01/2017   Lab Results  Component Value Date   CHOLHDL 5.2 (H) 07/01/2017   Lab Results  Component Value Date   HGBA1C 9.0 (H) 07/01/2017

## 2021-03-07 NOTE — Patient Instructions (Signed)
Preventive Care 48-48 Years Old, Female Preventive care refers to lifestyle choices and visits with your health care provider that can promote health and wellness. This includes: A yearly physical exam. This is also called an annual wellness visit. Regular dental and eye exams. Immunizations. Screening for certain conditions. Healthy lifestyle choices, such as: Eating a healthy diet. Getting regular exercise. Not using drugs or products that contain nicotine and tobacco. Limiting alcohol use. What can I expect for my preventive care visit? Physical exam Your health care provider will check your: Height and weight. These may be used to calculate your BMI (body mass index). BMI is a measurement that tells if you are at a healthy weight. Heart rate and blood pressure. Body temperature. Skin for abnormal spots. Counseling Your health care provider may ask you questions about your: Past medical problems. Family's medical history. Alcohol, tobacco, and drug use. Emotional well-being. Home life and relationship well-being. Sexual activity. Diet, exercise, and sleep habits. Work and work Statistician. Access to firearms. Method of birth control. Menstrual cycle. Pregnancy history. What immunizations do I need?  Vaccines are usually given at various ages, according to a schedule. Your health care provider will recommend vaccines for you based on your age, medicalhistory, and lifestyle or other factors, such as travel or where you work. What tests do I need? Blood tests Lipid and cholesterol levels. These may be checked every 5 years, or more often if you are over 37 years old. Hepatitis C test. Hepatitis B test. Screening Lung cancer screening. You may have this screening every year starting at age 30 if you have a 30-pack-year history of smoking and currently smoke or have quit within the past 15 years. Colorectal cancer screening. All adults should have this screening starting at  age 23 and continuing until age 3. Your health care provider may recommend screening at age 88 if you are at increased risk. You will have tests every 1-10 years, depending on your results and the type of screening test. Diabetes screening. This is done by checking your blood sugar (glucose) after you have not eaten for a while (fasting). You may have this done every 1-3 years. Mammogram. This may be done every 1-2 years. Talk with your health care provider about when you should start having regular mammograms. This may depend on whether you have a family history of breast cancer. BRCA-related cancer screening. This may be done if you have a family history of breast, ovarian, tubal, or peritoneal cancers. Pelvic exam and Pap test. This may be done every 3 years starting at age 79. Starting at age 54, this may be done every 5 years if you have a Pap test in combination with an HPV test. Other tests STD (sexually transmitted disease) testing, if you are at risk. Bone density scan. This is done to screen for osteoporosis. You may have this scan if you are at high risk for osteoporosis. Talk with your health care provider about your test results, treatment options,and if necessary, the need for more tests. Follow these instructions at home: Eating and drinking  Eat a diet that includes fresh fruits and vegetables, whole grains, lean protein, and low-fat dairy products. Take vitamin and mineral supplements as recommended by your health care provider. Do not drink alcohol if: Your health care provider tells you not to drink. You are pregnant, may be pregnant, or are planning to become pregnant. If you drink alcohol: Limit how much you have to 0-1 drink a day. Be aware  of how much alcohol is in your drink. In the [place] one drink equals one 12 oz bottle of beer (355 mL), one 5 oz glass of wine (148 mL), or one 1 oz glass of hard liquor (44 mL).  Lifestyle Take daily care of your teeth and  gums. Brush your teeth every morning and night with fluoride toothpaste. Floss one time each day. Stay active. Exercise for at least 30 minutes 5 or more days each week. Do not use any products that contain nicotine or tobacco, such as cigarettes, e-cigarettes, and chewing tobacco. If you need help quitting, ask your health care provider. Do not use drugs. If you are sexually active, practice safe sex. Use a condom or other form of protection to prevent STIs (sexually transmitted infections). If you do not wish to become pregnant, use a form of birth control. If you plan to become pregnant, see your health care provider for a prepregnancy visit. If told by your health care provider, take low-dose aspirin daily starting at age 29. Find healthy ways to cope with stress, such as: Meditation, yoga, or listening to music. Journaling. Talking to a trusted person. Spending time with friends and family. Safety Always wear your seat belt while driving or riding in a vehicle. Do not drive: If you have been drinking alcohol. Do not ride with someone who has been drinking. When you are tired or distracted. While texting. Wear a helmet and other protective equipment during sports activities. If you have firearms in your house, make sure you follow all gun safety procedures. What's next? Visit your health care provider once a year for an annual wellness visit. Ask your health care provider how often you should have your eyes and teeth checked. Stay up to date on all vaccines. This information is not intended to replace advice given to you by your health care provider. Make sure you discuss any questions you have with your healthcare provider. Document Revised: 05/10/2020 Document Reviewed: 04/17/2018 Elsevier Patient Education  2022 [employer].

## 2021-03-08 LAB — ANEMIA PROFILE B
Basophils Absolute: 0.1 10*3/uL (ref 0.0–0.2)
Basos: 1 %
EOS (ABSOLUTE): 0.1 10*3/uL (ref 0.0–0.4)
Eos: 1 %
Ferritin: 52 ng/mL (ref 15–150)
Folate: 6 ng/mL (ref >3.0)
Hematocrit: 40.1 % (ref 34.0–46.6)
Hemoglobin: 13.6 g/dL (ref 11.1–15.9)
Immature Grans (Abs): 0 10*3/uL (ref 0.0–0.1)
Immature Granulocytes: 0 %
Iron Saturation: 18 % (ref 15–55)
Iron: 68 ug/dL (ref 27–159)
Lymphocytes Absolute: 3.3 10*3/uL — ABNORMAL HIGH (ref 0.7–3.1)
Lymphs: 45 %
MCH: 28.6 pg (ref 26.6–33.0)
MCHC: 33.9 g/dL (ref 31.5–35.7)
MCV: 84 fL (ref 79–97)
Monocytes Absolute: 0.6 10*3/uL (ref 0.1–0.9)
Monocytes: 8 %
Neutrophils Absolute: 3.4 10*3/uL (ref 1.4–7.0)
Neutrophils: 45 %
Platelets: 376 10*3/uL (ref 150–450)
RBC: 4.75 x10E6/uL (ref 3.77–5.28)
RDW: 14.2 % (ref 11.7–15.4)
Retic Ct Pct: 2.6 % (ref 0.6–2.6)
Total Iron Binding Capacity: 372 ug/dL (ref 250–450)
UIBC: 304 ug/dL (ref 131–425)
Vitamin B-12: 166 pg/mL — ABNORMAL LOW (ref 232–1245)
WBC: 7.4 10*3/uL (ref 3.4–10.8)

## 2021-03-08 LAB — LIPID PANEL
Chol/HDL Ratio: 7 ratio — ABNORMAL HIGH (ref 0.0–4.4)
Cholesterol, Total: 223 mg/dL — ABNORMAL HIGH (ref 100–199)
HDL: 32 mg/dL — ABNORMAL LOW (ref >39)
LDL Chol Calc (NIH): 170 mg/dL — ABNORMAL HIGH (ref 0–99)
Triglycerides: 117 mg/dL (ref 0–149)
VLDL Cholesterol Cal: 21 mg/dL (ref 5–40)

## 2021-03-08 LAB — CMP14+EGFR
ALT: 12 IU/L (ref 0–32)
AST: 12 IU/L (ref 0–40)
Albumin/Globulin Ratio: 1.7 (ref 1.2–2.2)
Albumin: 4.5 g/dL (ref 3.8–4.8)
Alkaline Phosphatase: 95 IU/L (ref 44–121)
BUN/Creatinine Ratio: 8 — ABNORMAL LOW (ref 9–23)
BUN: 8 mg/dL (ref 6–24)
Bilirubin Total: 0.3 mg/dL (ref 0.0–1.2)
CO2: 22 mmol/L (ref 20–29)
Calcium: 9.7 mg/dL (ref 8.7–10.2)
Chloride: 102 mmol/L (ref 96–106)
Creatinine, Ser: 0.97 mg/dL (ref 0.57–1.00)
Globulin, Total: 2.6 g/dL (ref 1.5–4.5)
Glucose: 132 mg/dL — ABNORMAL HIGH (ref 65–99)
Potassium: 4.3 mmol/L (ref 3.5–5.2)
Sodium: 139 mmol/L (ref 134–144)
Total Protein: 7.1 g/dL (ref 6.0–8.5)
eGFR: 72 mL/min/{1.73_m2} (ref >59)

## 2021-03-08 LAB — VITAMIN D 25 HYDROXY (VIT D DEFICIENCY, FRACTURES): Vit D, 25-Hydroxy: 10.4 ng/mL — ABNORMAL LOW (ref 30.0–100.0)

## 2021-03-09 ENCOUNTER — Telehealth: Payer: Self-pay | Admitting: Family Medicine

## 2021-03-09 ENCOUNTER — Other Ambulatory Visit: Payer: Self-pay | Admitting: Family Medicine

## 2021-03-09 DIAGNOSIS — E559 Vitamin D deficiency, unspecified: Secondary | ICD-10-CM

## 2021-03-09 LAB — URINE CULTURE

## 2021-03-09 MED ORDER — VITAMIN D (ERGOCALCIFEROL) 1.25 MG (50000 UNIT) PO CAPS: 50000.0000 [IU] | ORAL_CAPSULE | ORAL | 2 refills | Status: DC

## 2021-03-09 NOTE — Telephone Encounter (Signed)
Refer to lab results.  Patient would like to wait until her follow up to see if she would like to start a cholesterol medication.   FYI

## 2021-03-17 DIAGNOSIS — E109 Type 1 diabetes mellitus without complications: Secondary | ICD-10-CM | POA: Diagnosis not present

## 2021-03-20 ENCOUNTER — Encounter: Payer: Self-pay | Admitting: Family Medicine

## 2021-04-03 ENCOUNTER — Other Ambulatory Visit: Payer: Self-pay

## 2021-04-03 ENCOUNTER — Encounter: Payer: BC Managed Care – PPO | Admitting: Obstetrics & Gynecology

## 2021-04-03 ENCOUNTER — Encounter: Payer: Self-pay | Admitting: Obstetrics & Gynecology

## 2021-04-03 ENCOUNTER — Ambulatory Visit (INDEPENDENT_AMBULATORY_CARE_PROVIDER_SITE_OTHER): Payer: BC Managed Care – PPO | Admitting: Obstetrics & Gynecology

## 2021-04-03 VITALS — BP 157/64 | HR 80 | Ht 66.0 in | Wt 335.4 lb

## 2021-04-03 DIAGNOSIS — N939 Abnormal uterine and vaginal bleeding, unspecified: Secondary | ICD-10-CM | POA: Diagnosis not present

## 2021-04-03 NOTE — Progress Notes (Signed)
   GYN VISIT Patient name: Kathryn Jimenez MRN [id number]  Date of birth: [birth year] Chief Complaint:   Referral (To discuss ablation)  History of Present Illness:   Craig Gullion is a 50 y.o. G0P0000 female being seen today for abnormal uterine bleeding.     AUB: Reports long-standing h/o AUB since teenager.  Never had pregnancy.  She has tried Depot and oral medication in the past.  Currently, not a candidate for estrogen due to h/o TIA and cHTN.  Prior work up with [facility]- records reviewed.    Menses lasting about 7 days, very heavy with clots.  Recently tried [employer], but had continued bleeding.  Then recently had 10 day course of Provera and now on Agystenin 2 pills daily, which has been working.  She now only reports light spotting.  [facility] has previously discussed an ablation and she wishes to consider; however, due to transportation restrictions, she would prefer to have surgery here in [place].  [place] completed outside facility: 8.6cm uterus with 1.2cm anterior fibroid.  Endometrium 58m.  Normal right ovary, left ovary not seen.   [facility] 04/03/2021 03/07/2021 01/12/2020 01/12/2020 03/17/2019  Decreased Interest '1 2 1 '$ 0 0  Down, Depressed, Hopeless '1 1 1 1 '$ 0  PHQ - 2 Score '2 3 2 1 '$ 0  Altered sleeping '1 2 1 '$ - -  Tired, decreased energy '1 2 1 '$ - -  Change in appetite '1 1 1 '$ - -  Feeling bad or failure about yourself  1 0 1 - -  Trouble concentrating '1 1 1 '$ - -  Moving slowly or fidgety/restless '1 1 1 '$ - -  Suicidal thoughts 0 0 0 - -  PHQ-9 Score '8 10 8 '$ - -  Difficult doing work/chores - Very difficult Somewhat difficult - -     Review of Systems:   Pertinent items are noted in HPI Denies fever/chills, dizziness, headaches, visual disturbances, fatigue, shortness of breath, chest pain, abdominal pain, vomiting, bowel movements, urination, or intercourse unless otherwise stated above.  Pertinent History Reviewed:  Reviewed past medical,surgical, social, obstetrical and family  history.  Reviewed problem list, medications and allergies. Physical Assessment:   Vitals:   04/03/21 1000  BP: (!) 157/64  Pulse: 80  Weight: (!) 335 lb 6.4 oz (152.1 kg)  Height: '5\' 6"'$  (1.676 m)  Body mass index is 54.13 kg/m.       Physical Examination:   General appearance: alert, well appearing, and in no distress  Psych: mood appropriate, normal affect  Skin: warm & dry   Cardiovascular: normal heart rate noted  Respiratory: normal respiratory effort, no distress  Abdomen: soft, non-tender, obese  [facility]el[facility] Extremities: minimal edema  Chaperone: N/A    Assessment & Plan:  1) Abnormal uterine bleeding -Management focused on ablation procedure.  Reviewed management options discussed risk/benefit including but not limited to risk of bleeding, infection, uterine perforation and potential failure or inability to complete ablation.  Additionally may consider IUD insertion at the time of procedure as a "back-up" method to ensure adequate management of bleeding.Questions and concerns were addressed and she desires to proceed.  Will also plan on EMB at the time of procedure as it does not seem to have been completed.   JJanyth Pupa DO Attending OClarissa FNorthern Hospital Of Surry Countyfor [facility]De[facility] Company [place]

## 2021-04-16 NOTE — H&P (Signed)
H&P  Kathryn Jimenez is a 50 y.o. G0P0000 who presents for scheduled hysteroscopy, D&C, HTA ablation and possible IUD insertion.  In review pt reports long-standing history of AUB.  She has tried several medication in the past and due to her h/o TIA and cHTN, she is not a candidate for estrogen therapy.  Menses last about 7 days, very heavy with clots.  Recently tried [employer], but had continued bleeding.  Then was placed on 10 day course of Provera and now on Agystenin 2 pills daily, which has been working for now.  Currently light spotting.  Prior work up has been with [facility] and she was sent to [place] for ablation treatment as their surgical facility was too far/transportation issues.  [place] completed outside facility: 8.6cm uterus with 1.2cm anterior fibroid.  Endometrium 5m.  Normal right ovary, left ovary not seen.  Denies any abnormal vaginal discharge, fevers, chills, sweats, dysuria, nausea, vomiting, other GI or GU symptoms or other general symptoms.  Past Medical History:  Diagnosis Date   Allergy    [place]inal pain ([place]    Anxiety    Arthritis    Asthma    Bladder disorder, other    Borderline personality disorder [place]    [place]id artery disease ([place] 03[place]   Depression    GERD (gastroesophageal reflux disease)    Headache    Hyperlipidemia    Hypertension    Kidney stones    Neuromuscular disorder (HCC)    Neuropathy   PTSD (post-traumatic stress disorder)    Shortness of breath    Sleep apnea    Stroke ([facility]    Thyroid disease    nodule   Past Surgical History:  Procedure Laterality Date   CARPAL TUNNEL RELEASE     left 2015   CERVICAL FUSION Bilateral 2003   C 5-6 with titanium screws   CHOLECYSTECTOMY     COLONOSCOPY WITH PROPOFOL N/A 06/24/2014   Procedure: COLONOSCOPY WITH PROPOFOL;  Surgeon: NRogene Houston MD;  Location: [facility];  Service: Endoscopy;  Laterality: N/A;  cecum time in 0810    time out   0821    total time 11 minutes   POLYPECTOMY N/A 06/24/2014    Procedure: RECTAL POLYPECTOMY;  Surgeon: NRogene Houston MD;  Location: [facility];  Service: Endoscopy;  Laterality: N/A;   SPINE SURGERY     URETHRAL DILATION  1981   WISDOM TOOTH EXTRACTION Bilateral 2001 and 1990s   top and bottom    OB History  Gravida Para Term Preterm AB Living  0 0 0 0 0 0  SAB IAB Ectopic Multiple Live Births  0 0 0 0 0  Patient denies any other pertinent gynecologic issues.  No current facility-administered medications on file prior to encounter.   Current Outpatient Medications on File Prior to Encounter  Medication Sig Dispense Refill   ACCU-CHEK AVIVA PLUS test strip SMARTSIG:Via Meter     acetaminophen (TYLENOL) 500 MG tablet Take 500 mg by mouth every 6 (six) hours as needed.     albuterol (PROAIR HFA) 108 (90 Base) MCG/ACT inhaler Inhale 2 puffs into the lungs every 6 (six) hours as needed for wheezing or shortness of breath. 18 g 5   buPROPion (WELLBUTRIN XL) 150 MG 24 hr tablet Take 150 mg by mouth daily.     clopidogrel (PLAVIX) 75 MG tablet Take 1 tablet (75 mg total) by mouth daily. 90 tablet 1   clotrimazole (LOTRIMIN) 1 % cream Apply 1 application topically  2 (two) times daily. 60 g 2   cycloSPORINE (RESTASIS) 0.05 % ophthalmic emulsion Apply to eye.     diphenhydrAMINE (BENADRYL) 25 mg capsule Take 25 mg by mouth every 6 (six) hours as needed.     docusate sodium (COLACE) 100 MG capsule Take 2 capsules (200 mg total) by mouth at bedtime. (Patient taking differently: Take 200 mg by mouth as needed.)  0   FLUoxetine (PROZAC) 40 MG capsule Take 40 mg by mouth daily.     furosemide (LASIX) 20 MG tablet Take 1 tablet (20 mg total) by mouth daily. 90 tablet 1   Incontinence Supply Disposable (PREVAIL IB FULL MAT BRIEF 2XL) MISC 1 each by Does not apply route every 2 (two) hours as needed. 96 each 5   insulin aspart (NOVOLOG) 100 UNIT/ML injection Inject into the skin 3 (three) times daily before meals. Per pump     ketoconazole (NIZORAL) 2 % cream Apply 1  application topically daily. 30 g 0   loperamide (IMODIUM A-D) 2 MG tablet Take 1 tablet (2 mg total) by mouth 4 (four) times daily as needed for diarrhea or loose stools. (Patient not taking: Reported on 04/03/2021) 30 tablet 0   LORazepam (ATIVAN) 0.5 MG tablet Take 0.5 mg by mouth. Takes 0.'25mg'$  - 0.'50mg'$ , 1-2 time daily as needed.     losartan (COZAAR) 25 MG tablet Take 1 tablet (25 mg total) by mouth daily. 30 tablet 2   norethindrone (AYGESTIN) 5 MG tablet Take 5 mg by mouth daily.     nystatin ointment (MYCOSTATIN) Apply 1 application topically 3 (three) times daily as needed.     potassium chloride (KLOR-CON) 10 MEQ tablet Take 1 tablet (10 mEq total) by mouth 2 (two) times daily. 180 tablet 1   potassium chloride (KLOR-CON) 10 MEQ tablet Take 10 mEq by mouth 2 (two) times daily. (Patient not taking: Reported on 04/03/2021)     valACYclovir (VALTREX) 500 MG tablet Take 500 mg by mouth daily.     Vitamin D, Ergocalciferol, (DRISDOL) 1.25 MG (50000 UNIT) CAPS capsule Take 1 capsule (50,000 Units total) by mouth every 7 (seven) days. 5 capsule 2   Allergies  Allergen Reactions   Actos [Pioglitazone] Swelling   Augmentin [Amoxicillin-Pot Clavulanate] Nausea Only    Causes severe diarrhea   Invokana [Canagliflozin]    Lopid [Gemfibrozil] Other (See Comments)    Burning tounge   Metoprolol Other (See Comments)    headaches   Naproxen Sodium Other (See Comments)    Patient states she felt loopy   Other Other (See Comments)   Statins Other (See Comments)    Joint pains Joint pains   Amlodipine Cough   Hctz [Hydrochlorothiazide] Rash   Lisinopril Cough   Morphine And Related Itching and Rash    Social History:   reports that she has never smoked. She has never used smokeless tobacco. She reports that she does not drink alcohol and does not use drugs. Family History  Problem Relation Age of Onset   COPD Mother    Arthritis Mother    Asthma Mother    Depression Mother    Diabetes  Mother    Heart disease Mother    Hyperlipidemia Mother    Hypertension Mother    Mental illness Mother    Skin cancer Mother    Lung cancer Father    Arthritis Father    Depression Father    Drug abuse Father    Early death Father  Kidney disease Father    Mental illness Father    Colon cancer Sister    Birth defects Sister    COPD Sister    Hyperlipidemia Sister    Learning disabilities Sister    Breast cancer Sister    Breast cancer Sister    Colon cancer Sister    Alcohol abuse Paternal Grandmother    Diabetes Paternal Grandmother    Alcohol abuse Paternal Grandfather     Review of Systems: Pertinent items noted in HPI and remainder of comprehensive ROS otherwise negative.  PHYSICAL EXAM: Examination completed in office CONSTITUTIONAL: Well-developed, well-nourished female in no acute distress.  HENT:  Normocephalic EYES: Conjunctivae and EOM are normal. Pupils are equal, round, and reactive to light. No scleral icterus.  NECK: Normal range of motion, supple, no masses SKIN: Skin is warm and dry. No rash noted. Not diaphoretic. No erythema. No pallor. NEUROLOGIC: Alert and oriented to person, place, and time. Normal reflexes, muscle tone coordination.  PSYCHIATRIC: Normal mood and affect. Normal behavior. Normal judgment and thought content. CARDIOVASCULAR: Normal heart rate noted, regular rhythm RESPIRATORY: Effort and breath sounds normal, no problems with respiration noted ABDOMEN: obese, Soft, nontender, nondistended. PELVIC: deferred MUSCULOSKELETAL: Minimal edema  Assessment: Abnormal uterine bleeding Morbid obesity H/o TIA Chronic HTN   Plan: Hysteroscopy, D&C, HTA ablation and possible IUD insertion NPO SCDs to OR LR @ 125cc/hr Risk/benefits reviewed including  but not limited to risk of bleeding, infection, failure of device or inability to complete ablation.  To help avoid any bleeding, possible placement of IUD following ablation.  Questions and  concerns were addressed and she desires to proceed.  Janyth Pupa, DO Attending [place], [facility] for [employer], [place]

## 2021-04-17 NOTE — Patient Instructions (Signed)
Kathryn Jimenez  04/17/2021     '@[email or web address]'$ @   Your procedure is scheduled on  04/19/2021.   Report to [facility] at  1020 A.M.   Call this number if you have problems the morning of surgery:  [phone]   Remember:  Do not eat or drink after midnight.      Dial your insulin pump down to your basal rate before bed the night before your procedure.      DO NOT take any medications for diabetes the morning of your procedure.   Use your inhaler before you come and bring your rescue inhaler with you.      Take these medicines the morning of surgery with A SIP OF WATER             wellbutrin, ativan (if needed).     Do not wear jewelry, make-up or nail polish.  Do not wear lotions, powders, or perfumes, or deodorant.  Do not shave 48 hours prior to surgery.  Men may shave face and neck.  Do not bring valuables to the hospital.  [facility] is not responsible for any belongings or valuables.  Contacts, dentures or bridgework may not be worn into surgery.  Leave your suitcase in the car.  After surgery it may be brought to your room.  For patients admitted to the hospital, discharge time will be determined by your treatment team.  Patients discharged the day of surgery will not be allowed to drive home and must have someone with them for 24 hours before your procedure.    Special instructions:   DO NOT smoke tobacco or vape for 24 hours before your procedure.  Please read over the following fact sheets that you were given. Coughing and Deep Breathing, Surgical Site Infection Prevention, Anesthesia Post-op Instructions, and Care and Recovery After Surgery      Dilation and Curettage or Vacuum Curettage, Care After The following information offers guidance on how to care for yourself after your procedure. Your doctor may also give you more specific instructions. Ifyou have problems or questions, contact your doctor. What can I expect after the  procedure? After the procedure, it is common to have: Mild pain or cramps. Some bleeding or spotting from the vagina. These may last for up to 2 weeks. Follow these instructions at home: Medicines Take over-the-counter and prescription medicines only as told by your doctor. If told, take steps to prevent problems with pooping (constipation). You may need to: Drink enough fluid to keep your pee (urine) pale yellow. Take medicines. You will be told what medicines to take. Eat foods that are high in fiber. These include beans, whole grains, and fresh fruits and vegetables. Limit foods that are high in fat and sugar. These include fried or sweet foods. Ask your doctor if you should avoid driving or using machines while you are taking your medicine. Activity  If you were given a medicine to help you relax (sedative) during your procedure, it can affect you for many hours. Do not drive or use machinery until your doctor says that it is safe. Rest as told by your doctor. Get up to take short walks every 1-2 hours. Ask for help if you feel weak or unsteady. Do not lift anything that is heavier than 10 lb (4.5 kg), or the limit that you are told. Return to your normal activities when your doctor says that it is safe.  Lifestyle For at least 2  weeks, or as long as told by your doctor: Do not douche. Do not use tampons. Do not have sex. General instructions Do not take baths, swim, or use a hot tub. Ask your doctor if you may take showers. Do not smoke or use any products that contain nicotine or tobacco. These can delay healing. If you need help quitting, ask your doctor. Wear compression stockings as told by your doctor. It is up to you to get the results of your procedure. Ask how to get your results when they are ready. Keep all follow-up visits. Contact a doctor if: You have very bad cramps that get worse or do not get better with medicine. You have very bad pain in your belly  (abdomen). You cannot drink fluids without vomiting. You have pain in the area just above your thighs. You have fluid from your vagina that smells bad. You have a rash. Get help right away if: You are bleeding a lot from your vagina. This means soaking more than one sanitary pad in 1 hour, and this happens for 2 hours in a row. You have a fever that is above 100.33F (38C). Your belly feels very tender or hard. You have chest pain. You have trouble breathing. You feel dizzy or light-headed. You faint. You have pain in your neck or shoulder area. These symptoms may be an emergency. Get help right away. Call your local emergency services (911 in the [place] Do not wait to see if the symptoms will go away. Do not drive yourself to the hospital. Summary After your procedure, it is common to have pain or cramping. It is also common to have bleeding or spotting from your vagina. Rest as told. Get up to take short walks every 1-2 hours. Do not lift anything that is heavier than 10 lb (4.5 kg), or the limit that you are told. Get help right away if you have problems from the procedure. Ask your doctor what problems to watch for. This information is not intended to replace advice given to you by your health care provider. Make sure you discuss any questions you have with your healthcare provider. Document Revised: 07/27/2020 Document Reviewed: 07/27/2020 Elsevier Patient Education  2022 [place] Anesthesia, Adult, Care After This sheet gives you information about how to care for yourself after your procedure. Your health care provider may also give you more specific instructions. If you have problems or questions, contact your health careprovider. What can I expect after the procedure? After the procedure, the following side effects are common: Pain or discomfort at the IV site. Nausea. Vomiting. Sore throat. Trouble concentrating. Feeling cold or chills. Feeling weak or  tired. Sleepiness and fatigue. Soreness and body aches. These side effects can affect parts of the body that were not involved in surgery. Follow these instructions at home: For the time period you were told by your health care provider:  Rest. Do not participate in activities where you could fall or become injured. Do not drive or use machinery. Do not drink alcohol. Do not take sleeping pills or medicines that cause drowsiness. Do not make important decisions or sign legal documents. Do not take care of children on your own.  Eating and drinking Follow any instructions from your health care provider about eating or drinking restrictions. When you feel hungry, start by eating small amounts of foods that are soft and easy to digest (bland), such as toast. Gradually return to your regular diet. Drink enough fluid to keep  your urine pale yellow. If you vomit, rehydrate by drinking water, juice, or clear broth. General instructions If you have sleep apnea, surgery and certain medicines can increase your risk for breathing problems. Follow instructions from your health care provider about wearing your sleep device: Anytime you are sleeping, including during daytime naps. While taking prescription pain medicines, sleeping medicines, or medicines that make you drowsy. Have a responsible adult stay with you for the time you are told. It is important to have someone help care for you until you are awake and alert. Return to your normal activities as told by your health care provider. Ask your health care provider what activities are safe for you. Take over-the-counter and prescription medicines only as told by your health care provider. If you smoke, do not smoke without supervision. Keep all follow-up visits as told by your health care provider. This is important. Contact a health care provider if: You have nausea or vomiting that does not get better with medicine. You cannot eat or drink  without vomiting. You have pain that does not get better with medicine. You are unable to pass urine. You develop a skin rash. You have a fever. You have redness around your IV site that gets worse. Get help right away if: You have difficulty breathing. You have chest pain. You have blood in your urine or stool, or you vomit blood. Summary After the procedure, it is common to have a sore throat or nausea. It is also common to feel tired. Have a responsible adult stay with you for the time you are told. It is important to have someone help care for you until you are awake and alert. When you feel hungry, start by eating small amounts of foods that are soft and easy to digest (bland), such as toast. Gradually return to your regular diet. Drink enough fluid to keep your urine pale yellow. Return to your normal activities as told by your health care provider. Ask your health care provider what activities are safe for you. This information is not intended to replace advice given to you by your health care provider. Make sure you discuss any questions you have with your healthcare provider. Document Revised: 04/21/2020 Document Reviewed: 11/19/2019 Elsevier Patient Education  2022 [place]. How to Use Chlorhexidine for Bathing Chlorhexidine gluconate (CHG) is a germ-killing (antiseptic) solution that is used to clean the skin. It can get rid of the bacteria that normally live on the skin and can keep them away for about 24 hours. To clean your skin with CHG, you may be given: A CHG solution to use in the shower or as part of a sponge bath. A prepackaged cloth that contains CHG. Cleaning your skin with CHG may help lower the risk for infection: While you are staying in the intensive care unit of the hospital. If you have a vascular access, such as a central line, to provide short-term or long-term access to your veins. If you have a catheter to drain urine from your bladder. If you are on a  ventilator. A ventilator is a machine that helps you breathe by moving air in and out of your lungs. After surgery. What are the risks? Risks of using CHG include: A skin reaction. Hearing loss, if CHG gets in your ears. Eye injury, if CHG gets in your eyes and is not rinsed out. The CHG product catching fire. Make sure that you avoid smoking and flames after applying CHG to your skin. Do not  use CHG: If you have a chlorhexidine allergy or have previously reacted to chlorhexidine. On babies younger than 74 months of age. How to use CHG solution Use CHG only as told by your health care provider, and follow the instructions on the label. Use the full amount of CHG as directed. Usually, this is one bottle. During a shower Follow these steps when using CHG solution during a shower (unless your health care provider gives you different instructions): Start the shower. Use your normal soap and shampoo to wash your face and hair. Turn off the shower or move out of the shower stream. Pour the CHG onto a clean washcloth. Do not use any type of brush or rough-edged sponge. Starting at your neck, lather your body down to your toes. Make sure you follow these instructions: If you will be having surgery, pay special attention to the part of your body where you will be having surgery. Scrub this area for at least 1 minute. Do not use CHG on your head or face. If the solution gets into your ears or eyes, rinse them well with water. Avoid your genital area. Avoid any areas of skin that have broken skin, cuts, or scrapes. Scrub your back and under your arms. Make sure to wash skin folds. Let the lather sit on your skin for 1-2 minutes or as long as told by your health care provider. Thoroughly rinse your entire body in the shower. Make sure that all body creases and crevices are rinsed well. Dry off with a clean towel. Do not put any substances on your body afterward--such as powder, lotion, or  perfume--unless you are told to do so by your health care provider. Only use lotions that are recommended by the manufacturer. Put on clean clothes or pajamas. If it is the night before your surgery, sleep in clean sheets.  During a sponge bath Follow these steps when using CHG solution during a sponge bath (unless your health care provider gives you different instructions): Use your normal soap and shampoo to wash your face and hair. Pour the CHG onto a clean washcloth. Starting at your neck, lather your body down to your toes. Make sure you follow these instructions: If you will be having surgery, pay special attention to the part of your body where you will be having surgery. Scrub this area for at least 1 minute. Do not use CHG on your head or face. If the solution gets into your ears or eyes, rinse them well with water. Avoid your genital area. Avoid any areas of skin that have broken skin, cuts, or scrapes. Scrub your back and under your arms. Make sure to wash skin folds. Let the lather sit on your skin for 1-2 minutes or as long as told by your health care provider. Using a different clean, wet washcloth, thoroughly rinse your entire body. Make sure that all body creases and crevices are rinsed well. Dry off with a clean towel. Do not put any substances on your body afterward--such as powder, lotion, or perfume--unless you are told to do so by your health care provider. Only use lotions that are recommended by the manufacturer. Put on clean clothes or pajamas. If it is the night before your surgery, sleep in clean sheets. How to use CHG prepackaged cloths Only use CHG cloths as told by your health care provider, and follow the instructions on the label. Use the CHG cloth on clean, dry skin. Do not use the CHG cloth on your  head or face unless your health care provider tells you to. When washing with the CHG cloth: Avoid your genital area. Avoid any areas of skin that have broken  skin, cuts, or scrapes. Before surgery Follow these steps when using a CHG cloth to clean before surgery (unless your health care provider gives you different instructions): Using the CHG cloth, vigorously scrub the part of your body where you will be having surgery. Scrub using a back-and-forth motion for 3 minutes. The area on your body should be completely wet with CHG when you are done scrubbing. Do not rinse. Discard the cloth and let the area air-dry. Do not put any substances on the area afterward, such as powder, lotion, or perfume. Put on clean clothes or pajamas. If it is the night before your surgery, sleep in clean sheets.  For general bathing Follow these steps when using CHG cloths for general bathing (unless your health care provider gives you different instructions). Use a separate CHG cloth for each area of your body. Make sure you wash between any folds of skin and between your fingers and toes. Wash your body in the following order, switching to a new cloth after each step: The front of your neck, shoulders, and chest. Both of your arms, under your arms, and your hands. Your stomach and groin area, avoiding the genitals. Your right leg and foot. Your left leg and foot. The back of your neck, your back, and your buttocks. Do not rinse. Discard the cloth and let the area air-dry. Do not put any substances on your body afterward--such as powder, lotion, or perfume--unless you are told to do so by your health care provider. Only use lotions that are recommended by the manufacturer. Put on clean clothes or pajamas. Contact a health care provider if: Your skin gets irritated after scrubbing. You have questions about using your solution or cloth. Get help right away if: Your eyes become very red or swollen. Your eyes itch badly. Your skin itches badly and is red or swollen. Your hearing changes. You have trouble seeing. You have swelling or tingling in your mouth or  throat. You have trouble breathing. You swallow any chlorhexidine. Summary Chlorhexidine gluconate (CHG) is a germ-killing (antiseptic) solution that is used to clean the skin. Cleaning your skin with CHG may help to lower your risk for infection. You may be given CHG to use for bathing. It may be in a bottle or in a prepackaged cloth to use on your skin. Carefully follow your health care provider's instructions and the instructions on the product label. Do not use CHG if you have a chlorhexidine allergy. Contact your health care provider if your skin gets irritated after scrubbing. This information is not intended to replace advice given to you by your health care provider. Make sure you discuss any questions you have with your healthcare provider. Document Revised: 12/18/2019 Document Reviewed: 01/22/2020 Elsevier Patient Education  [place].

## 2021-04-17 NOTE — Pre-Procedure Instructions (Signed)
For PAT tomorrow 04/18/2021. She is on plavix and I did not see a note as to what Dr Nelda Marseille wanted her to do with her plavix. I messaged Dr Nelda Marseille and she states if patient has taken plavix today, its okay but not to take any more before procedure. I called patient to make sure she did not take any more plavx and she verbalized understanding of this.

## 2021-04-18 ENCOUNTER — Encounter (HOSPITAL_COMMUNITY): Payer: Self-pay

## 2021-04-18 ENCOUNTER — Other Ambulatory Visit: Payer: Self-pay

## 2021-04-18 ENCOUNTER — Ambulatory Visit: Payer: BC Managed Care – PPO | Admitting: Family Medicine

## 2021-04-18 ENCOUNTER — Encounter (HOSPITAL_COMMUNITY)
Admission: RE | Admit: 2021-04-18 | Discharge: 2021-04-18 | Disposition: A | Discharge status: Home / Self Care | Payer: BC Managed Care – PPO | Source: Ambulatory Visit | Attending: Obstetrics & Gynecology | Admitting: Obstetrics & Gynecology

## 2021-04-18 ENCOUNTER — Other Ambulatory Visit: Payer: Self-pay | Admitting: Family Medicine

## 2021-04-18 DIAGNOSIS — N939 Abnormal uterine and vaginal bleeding, unspecified: Secondary | ICD-10-CM | POA: Diagnosis not present

## 2021-04-18 DIAGNOSIS — Z79899 Other long term (current) drug therapy: Secondary | ICD-10-CM | POA: Diagnosis not present

## 2021-04-18 DIAGNOSIS — Z803 Family history of malignant neoplasm of breast: Secondary | ICD-10-CM | POA: Diagnosis not present

## 2021-04-18 DIAGNOSIS — Z8261 Family history of arthritis: Secondary | ICD-10-CM | POA: Diagnosis not present

## 2021-04-18 DIAGNOSIS — I10 Essential (primary) hypertension: Secondary | ICD-10-CM | POA: Diagnosis not present

## 2021-04-18 DIAGNOSIS — Z8249 Family history of ischemic heart disease and other diseases of the circulatory system: Secondary | ICD-10-CM | POA: Diagnosis not present

## 2021-04-18 DIAGNOSIS — Z801 Family history of malignant neoplasm of trachea, bronchus and lung: Secondary | ICD-10-CM | POA: Diagnosis not present

## 2021-04-18 DIAGNOSIS — N840 Polyp of corpus uteri: Secondary | ICD-10-CM | POA: Diagnosis not present

## 2021-04-18 DIAGNOSIS — Z833 Family history of diabetes mellitus: Secondary | ICD-10-CM | POA: Diagnosis not present

## 2021-04-18 DIAGNOSIS — Z8673 Personal history of transient ischemic attack (TIA), and cerebral infarction without residual deficits: Secondary | ICD-10-CM | POA: Diagnosis not present

## 2021-04-18 DIAGNOSIS — J4520 Mild intermittent asthma, uncomplicated: Secondary | ICD-10-CM

## 2021-04-18 DIAGNOSIS — R9389 Abnormal findings on diagnostic imaging of other specified body structures: Secondary | ICD-10-CM | POA: Diagnosis not present

## 2021-04-18 DIAGNOSIS — Z01818 Encounter for other preprocedural examination: Secondary | ICD-10-CM | POA: Insufficient documentation

## 2021-04-18 DIAGNOSIS — N938 Other specified abnormal uterine and vaginal bleeding: Secondary | ICD-10-CM | POA: Diagnosis present

## 2021-04-18 DIAGNOSIS — Z818 Family history of other mental and behavioral disorders: Secondary | ICD-10-CM | POA: Diagnosis not present

## 2021-04-18 DIAGNOSIS — Z825 Family history of asthma and other chronic lower respiratory diseases: Secondary | ICD-10-CM | POA: Diagnosis not present

## 2021-04-18 DIAGNOSIS — Z7951 Long term (current) use of inhaled steroids: Secondary | ICD-10-CM | POA: Diagnosis not present

## 2021-04-18 DIAGNOSIS — Z793 Long term (current) use of hormonal contraceptives: Secondary | ICD-10-CM | POA: Diagnosis not present

## 2021-04-18 DIAGNOSIS — Z888 Allergy status to other drugs, medicaments and biological substances status: Secondary | ICD-10-CM | POA: Diagnosis not present

## 2021-04-18 DIAGNOSIS — Z880 Allergy status to penicillin: Secondary | ICD-10-CM | POA: Diagnosis not present

## 2021-04-18 DIAGNOSIS — Z885 Allergy status to narcotic agent status: Secondary | ICD-10-CM | POA: Diagnosis not present

## 2021-04-18 DIAGNOSIS — Z8349 Family history of other endocrine, nutritional and metabolic diseases: Secondary | ICD-10-CM | POA: Diagnosis not present

## 2021-04-18 DIAGNOSIS — Z7902 Long term (current) use of antithrombotics/antiplatelets: Secondary | ICD-10-CM | POA: Diagnosis not present

## 2021-04-18 DIAGNOSIS — Z6841 Body Mass Index (BMI) 40.0 and over, adult: Secondary | ICD-10-CM | POA: Diagnosis not present

## 2021-04-18 DIAGNOSIS — Z841 Family history of disorders of kidney and ureter: Secondary | ICD-10-CM | POA: Diagnosis not present

## 2021-04-18 DIAGNOSIS — Z881 Allergy status to other antibiotic agents status: Secondary | ICD-10-CM | POA: Diagnosis not present

## 2021-04-18 DIAGNOSIS — E119 Type 2 diabetes mellitus without complications: Secondary | ICD-10-CM | POA: Diagnosis not present

## 2021-04-18 LAB — BASIC METABOLIC PANEL
Anion gap: 9 (ref 5–15)
BUN: 7 mg/dL (ref 6–20)
CO2: 24 mmol/L (ref 22–32)
Calcium: 9 mg/dL (ref 8.9–10.3)
Chloride: 103 mmol/L (ref 98–111)
Creatinine, Ser: 0.94 mg/dL (ref 0.44–1.00)
GFR, Estimated: >60 mL/min (ref >60)
Glucose, Bld: 137 mg/dL — ABNORMAL HIGH (ref 70–99)
Potassium: 4.3 mmol/L (ref 3.5–5.1)
Sodium: 136 mmol/L (ref 135–145)

## 2021-04-18 LAB — CBC
HCT: 43.2 % (ref 36.0–46.0)
Hemoglobin: 14 g/dL (ref 12.0–15.0)
MCH: 29 pg (ref 26.0–34.0)
MCHC: 32.4 g/dL (ref 30.0–36.0)
MCV: 89.4 fL (ref 80.0–100.0)
Platelets: 375 10*3/uL (ref 150–400)
RBC: 4.83 MIL/uL (ref 3.87–5.11)
RDW: 13.2 % (ref 11.5–15.5)
WBC: 7.2 10*3/uL (ref 4.0–10.5)
nRBC: 0 % (ref 0.0–0.2)

## 2021-04-18 LAB — PREGNANCY, URINE: Preg Test, Ur: NEGATIVE

## 2021-04-19 ENCOUNTER — Ambulatory Visit (HOSPITAL_COMMUNITY): Payer: BC Managed Care – PPO | Admitting: Anesthesiology

## 2021-04-19 ENCOUNTER — Encounter (HOSPITAL_COMMUNITY): Payer: Self-pay | Admitting: Obstetrics & Gynecology

## 2021-04-19 ENCOUNTER — Other Ambulatory Visit: Payer: Self-pay

## 2021-04-19 ENCOUNTER — Ambulatory Visit (HOSPITAL_COMMUNITY)
Admission: RE | Admit: 2021-04-19 | Discharge: 2021-04-19 | Disposition: A | Discharge status: Home / Self Care | Payer: BC Managed Care – PPO | Attending: Obstetrics & Gynecology | Admitting: Obstetrics & Gynecology

## 2021-04-19 ENCOUNTER — Ambulatory Visit (HOSPITAL_COMMUNITY)
Admission: RE | Disposition: A | Discharge status: Home / Self Care | Payer: Self-pay | Source: Home / Self Care | Attending: Obstetrics & Gynecology

## 2021-04-19 DIAGNOSIS — N939 Abnormal uterine and vaginal bleeding, unspecified: Secondary | ICD-10-CM | POA: Diagnosis not present

## 2021-04-19 DIAGNOSIS — Z7902 Long term (current) use of antithrombotics/antiplatelets: Secondary | ICD-10-CM | POA: Insufficient documentation

## 2021-04-19 DIAGNOSIS — Z79899 Other long term (current) drug therapy: Secondary | ICD-10-CM | POA: Insufficient documentation

## 2021-04-19 DIAGNOSIS — Z818 Family history of other mental and behavioral disorders: Secondary | ICD-10-CM | POA: Insufficient documentation

## 2021-04-19 DIAGNOSIS — R9389 Abnormal findings on diagnostic imaging of other specified body structures: Secondary | ICD-10-CM | POA: Insufficient documentation

## 2021-04-19 DIAGNOSIS — Z7951 Long term (current) use of inhaled steroids: Secondary | ICD-10-CM | POA: Diagnosis not present

## 2021-04-19 DIAGNOSIS — Z888 Allergy status to other drugs, medicaments and biological substances status: Secondary | ICD-10-CM | POA: Insufficient documentation

## 2021-04-19 DIAGNOSIS — N840 Polyp of corpus uteri: Secondary | ICD-10-CM | POA: Insufficient documentation

## 2021-04-19 DIAGNOSIS — Z793 Long term (current) use of hormonal contraceptives: Secondary | ICD-10-CM | POA: Insufficient documentation

## 2021-04-19 DIAGNOSIS — Z841 Family history of disorders of kidney and ureter: Secondary | ICD-10-CM | POA: Insufficient documentation

## 2021-04-19 DIAGNOSIS — Z803 Family history of malignant neoplasm of breast: Secondary | ICD-10-CM | POA: Insufficient documentation

## 2021-04-19 DIAGNOSIS — Z6841 Body Mass Index (BMI) 40.0 and over, adult: Secondary | ICD-10-CM | POA: Diagnosis not present

## 2021-04-19 DIAGNOSIS — E119 Type 2 diabetes mellitus without complications: Secondary | ICD-10-CM | POA: Diagnosis not present

## 2021-04-19 DIAGNOSIS — Z30430 Encounter for insertion of intrauterine contraceptive device: Secondary | ICD-10-CM

## 2021-04-19 DIAGNOSIS — Z825 Family history of asthma and other chronic lower respiratory diseases: Secondary | ICD-10-CM | POA: Insufficient documentation

## 2021-04-19 DIAGNOSIS — Z8349 Family history of other endocrine, nutritional and metabolic diseases: Secondary | ICD-10-CM | POA: Insufficient documentation

## 2021-04-19 DIAGNOSIS — Z8249 Family history of ischemic heart disease and other diseases of the circulatory system: Secondary | ICD-10-CM | POA: Insufficient documentation

## 2021-04-19 DIAGNOSIS — Z801 Family history of malignant neoplasm of trachea, bronchus and lung: Secondary | ICD-10-CM | POA: Insufficient documentation

## 2021-04-19 DIAGNOSIS — Z833 Family history of diabetes mellitus: Secondary | ICD-10-CM | POA: Insufficient documentation

## 2021-04-19 DIAGNOSIS — Z881 Allergy status to other antibiotic agents status: Secondary | ICD-10-CM | POA: Diagnosis not present

## 2021-04-19 DIAGNOSIS — Z8673 Personal history of transient ischemic attack (TIA), and cerebral infarction without residual deficits: Secondary | ICD-10-CM | POA: Insufficient documentation

## 2021-04-19 DIAGNOSIS — I10 Essential (primary) hypertension: Secondary | ICD-10-CM | POA: Diagnosis not present

## 2021-04-19 DIAGNOSIS — Z885 Allergy status to narcotic agent status: Secondary | ICD-10-CM | POA: Insufficient documentation

## 2021-04-19 DIAGNOSIS — Z8261 Family history of arthritis: Secondary | ICD-10-CM | POA: Insufficient documentation

## 2021-04-19 DIAGNOSIS — Z880 Allergy status to penicillin: Secondary | ICD-10-CM | POA: Insufficient documentation

## 2021-04-19 HISTORY — PX: INTRAUTERINE DEVICE (IUD) INSERTION: SHX5877

## 2021-04-19 HISTORY — PX: DILITATION & CURRETTAGE/HYSTROSCOPY WITH HYDROTHERMAL ABLATION: SHX5570

## 2021-04-19 LAB — HEMOGLOBIN A1C
Hgb A1c MFr Bld: 8.6 % — ABNORMAL HIGH (ref 4.8–5.6)
Mean Plasma Glucose: 200 mg/dL

## 2021-04-19 LAB — GLUCOSE, CAPILLARY
Glucose-Capillary: 142 mg/dL — ABNORMAL HIGH (ref 70–99)
Glucose-Capillary: 129 mg/dL — ABNORMAL HIGH (ref 70–99)
Glucose-Capillary: 153 mg/dL — ABNORMAL HIGH (ref 70–99)

## 2021-04-19 SURGERY — DILATATION & CURETTAGE/HYSTEROSCOPY WITH HYDROTHERMAL ABLATION
Anesthesia: General | Site: Uterus

## 2021-04-19 MED ORDER — LIDOCAINE-EPINEPHRINE 0.5 %-1:200000 IJ SOLN
INTRAMUSCULAR | Status: DC | PRN
  Administered 2021-04-19: 19 mL

## 2021-04-19 MED ORDER — LEVONORGESTREL 20.1 MCG/DAY IU IUD
1.0000 | INTRAUTERINE_SYSTEM | INTRAUTERINE | Status: DC
  Filled 2021-04-19: qty 1

## 2021-04-19 MED ORDER — SEVOFLURANE IN SOLN
RESPIRATORY_TRACT | Status: AC
  Filled 2021-04-19: qty 250

## 2021-04-19 MED ORDER — PROPOFOL 10 MG/ML IV BOLUS
INTRAVENOUS | Status: AC
  Filled 2021-04-19: qty 20

## 2021-04-19 MED ORDER — ONDANSETRON HCL 4 MG/2ML IJ SOLN
INTRAMUSCULAR | Status: DC | PRN
Start: 2021-04-19 — End: 2021-04-19
  Administered 2021-04-19: 4 mg via INTRAVENOUS

## 2021-04-19 MED ORDER — LACTATED RINGERS IV SOLN: INTRAVENOUS | Status: DC

## 2021-04-19 MED ORDER — PROPOFOL 10 MG/ML IV BOLUS
INTRAVENOUS | Status: DC | PRN
  Administered 2021-04-19: 200 mg via INTRAVENOUS

## 2021-04-19 MED ORDER — HYDROCODONE-ACETAMINOPHEN 5-325 MG PO TABS
1.0000 | ORAL_TABLET | Freq: Once | ORAL | Status: AC
  Administered 2021-04-19: 1 via ORAL

## 2021-04-19 MED ORDER — SUCCINYLCHOLINE 20MG/ML (10ML) SYRINGE FOR MEDFUSION PUMP - OPTIME
INTRAMUSCULAR | Status: DC | PRN
  Administered 2021-04-19: 140 mg via INTRAVENOUS

## 2021-04-19 MED ORDER — ROCURONIUM 10MG/ML (10ML) SYRINGE FOR MEDFUSION PUMP - OPTIME
INTRAVENOUS | Status: DC | PRN
Start: 2021-04-19 — End: 2021-04-19
  Administered 2021-04-19: 30 mg via INTRAVENOUS
  Administered 2021-04-19: 10 mg via INTRAVENOUS

## 2021-04-19 MED ORDER — MIDAZOLAM HCL 2 MG/2ML IJ SOLN
INTRAMUSCULAR | Status: AC
  Filled 2021-04-19: qty 2

## 2021-04-19 MED ORDER — EPHEDRINE SULFATE 50 MG/ML IJ SOLN
INTRAMUSCULAR | Status: DC | PRN
  Administered 2021-04-19: 10 mg via INTRAVENOUS

## 2021-04-19 MED ORDER — ROCURONIUM BROMIDE 10 MG/ML (PF) SYRINGE
PREFILLED_SYRINGE | INTRAVENOUS | Status: AC
  Filled 2021-04-19: qty 10

## 2021-04-19 MED ORDER — LABETALOL HCL 5 MG/ML IV SOLN
INTRAVENOUS | Status: DC | PRN
  Administered 2021-04-19: 5 mg via INTRAVENOUS

## 2021-04-19 MED ORDER — SODIUM CHLORIDE 0.9 % IR SOLN
Status: DC | PRN
  Administered 2021-04-19 (×2): 3000 mL
  Administered 2021-04-19: 1000 mL

## 2021-04-19 MED ORDER — CHLORHEXIDINE GLUCONATE 0.12 % MT SOLN: 15.0000 mL | Freq: Once | OROMUCOSAL | Status: DC

## 2021-04-19 MED ORDER — OXYCODONE HCL 5 MG PO TABS: 5.0000 mg | ORAL_TABLET | Freq: Four times a day (QID) | ORAL | 0 refills | Status: AC | PRN

## 2021-04-19 MED ORDER — FENTANYL CITRATE PF 50 MCG/ML IJ SOSY: 25.0000 ug | PREFILLED_SYRINGE | INTRAMUSCULAR | Status: DC | PRN

## 2021-04-19 MED ORDER — FENTANYL CITRATE (PF) 100 MCG/2ML IJ SOLN
INTRAMUSCULAR | Status: DC | PRN
  Administered 2021-04-19: 50 ug via INTRAVENOUS
  Administered 2021-04-19: 100 ug via INTRAVENOUS

## 2021-04-19 MED ORDER — LIDOCAINE HCL (CARDIAC) PF 50 MG/5ML IV SOSY
PREFILLED_SYRINGE | INTRAVENOUS | Status: DC | PRN
  Administered 2021-04-19: 100 mg via INTRAVENOUS

## 2021-04-19 MED ORDER — LABETALOL HCL 5 MG/ML IV SOLN
INTRAVENOUS | Status: AC
  Filled 2021-04-19: qty 4

## 2021-04-19 MED ORDER — MIDAZOLAM HCL 5 MG/5ML IJ SOLN
INTRAMUSCULAR | Status: DC | PRN
  Administered 2021-04-19: 2 mg via INTRAVENOUS

## 2021-04-19 MED ORDER — HYDROCODONE-ACETAMINOPHEN 5-325 MG PO TABS
ORAL_TABLET | ORAL | Status: AC
  Filled 2021-04-19: qty 1

## 2021-04-19 MED ORDER — SUGAMMADEX SODIUM 200 MG/2ML IV SOLN
INTRAVENOUS | Status: DC | PRN
Start: 2021-04-19 — End: 2021-04-19
  Administered 2021-04-19: 200 mg via INTRAVENOUS

## 2021-04-19 MED ORDER — ONDANSETRON HCL 4 MG/2ML IJ SOLN
INTRAMUSCULAR | Status: AC
  Filled 2021-04-19: qty 2

## 2021-04-19 MED ORDER — LIDOCAINE-EPINEPHRINE 0.5 %-1:200000 IJ SOLN
INTRAMUSCULAR | Status: AC
  Filled 2021-04-19: qty 1

## 2021-04-19 MED ORDER — LIDOCAINE HCL (PF) 2 % IJ SOLN
INTRAMUSCULAR | Status: AC
  Filled 2021-04-19: qty 5

## 2021-04-19 MED ORDER — FENTANYL CITRATE (PF) 250 MCG/5ML IJ SOLN
INTRAMUSCULAR | Status: AC
  Filled 2021-04-19: qty 5

## 2021-04-19 MED ORDER — ONDANSETRON HCL 4 MG/2ML IJ SOLN: 4.0000 mg | Freq: Once | INTRAMUSCULAR | Status: DC | PRN

## 2021-04-19 MED ORDER — ORAL CARE MOUTH RINSE: 15.0000 mL | Freq: Once | OROMUCOSAL | Status: DC

## 2021-04-19 SURGICAL SUPPLY — 16 items
CATH ROBINSON RED A/P 16FR (CATHETERS) ×2 IMPLANT
DILATOR CANAL MILEX (MISCELLANEOUS) IMPLANT
GAUZE 4X4 16PLY ~~LOC~~+RFID DBL (SPONGE) ×2 IMPLANT
GLOVE SURG LTX SZ6.5 (GLOVE) ×2 IMPLANT
GLOVE SURG UNDER POLY LF SZ7 (GLOVE) ×6 IMPLANT
GOWN STRL REUS W/ TWL LRG LVL3 (GOWN DISPOSABLE) ×1 IMPLANT
GOWN STRL REUS W/TWL LRG LVL3 (GOWN DISPOSABLE) ×4 IMPLANT
IV NS IRRIG 3000ML ARTHROMATIC (IV SOLUTION) ×4 IMPLANT
LILETTA (Levonorgestrel-releasing intrauterine dev ×2 IMPLANT
NS IRRIG 1000ML POUR BTL (IV SOLUTION) ×2 IMPLANT
PACK VAGINAL MINOR WOMEN LF (CUSTOM PROCEDURE TRAY) ×2 IMPLANT
PAD ARMBOARD 7.5X6 YLW CONV (MISCELLANEOUS) ×2 IMPLANT
SET GENESYS HTA PROCERVA (MISCELLANEOUS) ×2 IMPLANT
SET IRRIG Y TYPE TUR BLADDER L (SET/KITS/TRAYS/PACK) ×1 IMPLANT
UNDERPAD 30X36 HEAVY ABSORB (UNDERPADS AND DIAPERS) ×2 IMPLANT
YANKAUER SUCT BULB TIP 10FT TU (MISCELLANEOUS) ×1 IMPLANT

## 2021-04-19 NOTE — Anesthesia Preprocedure Evaluation (Signed)
Anesthesia Evaluation  Patient identified by MRN, date of birth, ID band Patient awake    Reviewed: Allergy & Precautions, H&P , NPO status , Patient's Chart, lab work & pertinent test results, reviewed documented beta blocker date and time   Airway Mallampati: II  TM Distance: >3 FB Neck ROM: full    Dental no notable dental hx.    Pulmonary shortness of breath, asthma , sleep apnea ,    Pulmonary exam normal breath sounds clear to auscultation       Cardiovascular Exercise Tolerance: Good hypertension, + angina  Rhythm:regular Rate:Normal     Neuro/Psych  Headaches, PSYCHIATRIC DISORDERS Anxiety Depression  Neuromuscular disease CVA    GI/Hepatic Neg liver ROS, GERD  Medicated,  Endo/Other  negative endocrine ROSdiabetes  Renal/GU Renal disease  negative genitourinary   Musculoskeletal   Abdominal   Peds  Hematology negative hematology ROS (+)   Anesthesia Other Findings   Reproductive/Obstetrics negative OB ROS                             Anesthesia Physical Anesthesia Plan  ASA: 3  Anesthesia Plan: General and General ETT   Post-op Pain Management:    Induction:   PONV Risk Score and Plan: Ondansetron  Airway Management Planned:   Additional Equipment:   Intra-op Plan:   Post-operative Plan:   Informed Consent: I have reviewed the patients History and Physical, chart, labs and discussed the procedure including the risks, benefits and alternatives for the proposed anesthesia with the patient or authorized representative who has indicated his/her understanding and acceptance.     Dental Advisory Given  Plan Discussed with: CRNA  Anesthesia Plan Comments:         Anesthesia Quick Evaluation

## 2021-04-19 NOTE — Anesthesia Procedure Notes (Signed)
Procedure Name: Intubation Date/Time: 04/19/2021 10:20 AM Performed by: Ollen Bowl, CRNA Pre-anesthesia Checklist: Patient identified, Patient being monitored, Timeout performed, Emergency Drugs available and Suction available Patient Re-evaluated:Patient Re-evaluated prior to induction Oxygen Delivery Method: Circle system utilized Preoxygenation: Pre-oxygenation with 100% oxygen Induction Type: IV induction Ventilation: Mask ventilation without difficulty Laryngoscope Size: Mac and 3 Grade View: Grade I Tube type: Oral Tube size: 7.0 mm Number of attempts: 1 Airway Equipment and Method: Stylet Placement Confirmation: ETT inserted through vocal cords under direct vision, positive ETCO2 and breath sounds checked- equal and bilateral Secured at: 21 cm Tube secured with: Tape Dental Injury: Teeth and Oropharynx as per pre-operative assessment

## 2021-04-19 NOTE — Anesthesia Postprocedure Evaluation (Signed)
Anesthesia Post Note  Patient: Kathryn Jimenez  Procedure(s) Performed: DILATATION & CURETTAGE/HYSTEROSCOPY WITH HYDROTHERMAL ABLATION (Uterus) INTRAUTERINE DEVICE (IUD) INSERTION (Uterus)  Patient location during evaluation: Phase II Anesthesia Type: General Level of consciousness: awake Pain management: pain level controlled Vital Signs Assessment: post-procedure vital signs reviewed and stable Respiratory status: spontaneous breathing and respiratory function stable Cardiovascular status: blood pressure returned to baseline and stable Postop Assessment: no headache and no apparent nausea or vomiting Anesthetic complications: no Comments: Late entry   No notable events documented.   Last Vitals:  Vitals:   04/19/21 0852 04/19/21 1136  BP: (!) 163/76 (!) 171/95  Pulse: 75 93  Resp: 17 18  Temp: 36.8 C 36.9 C  SpO2: 99% 95%    Last Pain:  Vitals:   04/19/21 0852  TempSrc: Oral  PainSc: 0-No pain                 Louann Sjogren

## 2021-04-19 NOTE — Op Note (Signed)
Operative Report  PreOp: 1) Abnormal uterine bleeding 2) Morbid obesity  PostOp: same Procedure:  Hysteroscopy, Dilation and Curettage, Endometrial ablation, Liletta insertion Surgeon: Dr. Janyth Pupa Anesthesia: General Complications:none EBL: Minimal IVF:1000cc  Findings:8cm uterus with thickened endometrium, both ostia visualized  Specimens: 1) endometrial curettings   Procedure: The patient was taken to the operating room where she underwent general anesthesia without difficulty. The patient was placed in a low lithotomy position using Allen stirrups. The patient was examined with the findings as noted above.  She was then prepped and draped in the normal sterile fashion.  A sterile speculum was inserted into the vagina. Cervical block was completed using 1% lidocaine with epinephrine.  A single tooth tenaculum was placed on the anterior lip of the cervix.  The uterus was then sounded to 8cm. The endocervical canal was then serially dilated to 21 using Hank dilators.  The diagnostic hysteroscope was then inserted with resistance and was not able to be passed through the endocervical canal.  A smaller hysteroscope was then used to enter into the cavity using hydro distension.  Findings as listed above.  The hysteroscope was removed and further dilation was required in order to pass the larger operative hysteroscope into the endometrium.   Visualization was achieved using NS as a distending medium. The hysteroscope was removed, sharp curettage was performed.  The tissue was sent to pathology.  Attention was turned to the HTA system.  The seal test was performed and passed.  Ablation was then completed under direct visualization, global ablation was seen without any perforations.  The HTA system was removed.  The uterus was sounded and confirmed 8cm.  The [place] IUD was inserted without difficulty.  The strings were trimmed.  All instruments were removed.  Hemostasis was observed at the cervical  site.  The patient was repositioned to the supine position. The patient tolerated the procedure without any complications and taken to recovery in stable condition.   Janyth Pupa, DO [phone] (pager) [phone] (office)

## 2021-04-19 NOTE — Transfer of Care (Signed)
Immediate Anesthesia Transfer of Care Note  Patient: Kathryn Jimenez  Procedure(s) Performed: DILATATION & CURETTAGE/HYSTEROSCOPY WITH HYDROTHERMAL ABLATION (Uterus) INTRAUTERINE DEVICE (IUD) INSERTION (Uterus)  Patient Location: PACU  Anesthesia Type:General  Level of Consciousness: awake and alert   Airway & Oxygen Therapy: Patient Spontanous Breathing  Post-op Assessment: Report given to RN  Post vital signs: Reviewed and stable  Last Vitals:  Vitals Value Taken Time  BP 171/95 04/19/21 1136  Temp    Pulse 92 04/19/21 1142  Resp 18 04/19/21 1142  SpO2 97 % 04/19/21 1142  Vitals shown include unvalidated device data.  Last Pain:  Vitals:   04/19/21 0852  TempSrc: Oral  PainSc: 0-No pain      Patients Stated Pain Goal: 4 ([id number])  Complications: No notable events documented.

## 2021-04-19 NOTE — Discharge Instructions (Addendum)
HOME INSTRUCTIONS  Please note any unusual or excessive bleeding, pain, swelling. Mild dizziness or drowsiness are normal for about 24 hours after surgery.   Shower when comfortable  Restrictions: No driving for 24 hours or while taking pain medications.  Activity:  No heavy lifting (> 20 lbs), nothing in vagina (no tampons, douching, or intercourse) x 4 weeks; no tub baths for 4 weeks Vaginal spotting is expected but if your bleeding is heavy, period like,  please call the office   Diet:  You may return to your regular diet.  Do not eat large meals.  Eat small frequent meals throughout the day.  Continue to drink a good amount of water at least 6-8 glasses of water per day, hydration is very important for the healing process.  Pain Management: Take Tylenol over the counter for pain management.  For severe or breakthrough pain, take oxycodone 1 tablet.  Due to prior itching/rash from morphine, may consider taking benadryl along with this medication.  Always take prescription pain medication with food, it may cause constipation, increase fluids and fiber and you may want to take an over-the-counter stool softener like Colace as needed up to 2x a day.    Alcohol -- Avoid for 24 hours and while taking pain medications.  Nausea: Take sips of ginger ale or soda  Fever -- Call physician if temperature over 101 degrees  Follow up:  Please call the office to schedule a follow up appointment in 2 mos.  If you experience fever (a temperature greater than 100.4), pain unrelieved by pain medication, shortness of breath, swelling of a single leg, or any other symptoms which are concerning to you please the office immediately.

## 2021-04-19 NOTE — Interval H&P Note (Signed)
History and Physical Interval Note:  04/19/2021 9:46 AM  Kathryn Jimenez  has presented today for surgery, with the diagnosis of Abnormal Uterine Bleeding.  The various methods of treatment have been discussed with the patient and family. After consideration of risks, benefits and other options for treatment, the patient has consented to  Procedure(s): DILATATION & CURETTAGE/HYSTEROSCOPY WITH HYDROTHERMAL ABLATION (N/A) INTRAUTERINE DEVICE (IUD) INSERTION (N/A) as a surgical intervention.  The patient's history has been reviewed, patient examined, no change in status, stable for surgery.  I have reviewed the patient's chart and labs.  Questions were answered to the patient's satisfaction.     [name]

## 2021-04-19 NOTE — Anesthesia Postprocedure Evaluation (Signed)
Anesthesia Post Note  Patient: Kathryn Jimenez  Procedure(s) Performed: DILATATION & CURETTAGE/HYSTEROSCOPY WITH HYDROTHERMAL ABLATION (Uterus) INTRAUTERINE DEVICE (IUD) INSERTION (Uterus)  Patient location during evaluation: Phase II Anesthesia Type: General Level of consciousness: awake Pain management: pain level controlled Vital Signs Assessment: post-procedure vital signs reviewed and stable Respiratory status: spontaneous breathing and respiratory function stable Cardiovascular status: blood pressure returned to baseline and stable Postop Assessment: no headache and no apparent nausea or vomiting Anesthetic complications: no Comments: Late entry   No notable events documented.   Last Vitals:  Vitals:   04/19/21 0852 04/19/21 1136  BP: (!) 163/76 (!) 171/95  Pulse: 75 93  Resp: 17 18  Temp: 36.8 C 36.9 C  SpO2: 99% 95%    Last Pain:  Vitals:   04/19/21 1136  TempSrc:   PainSc: 0-No pain                 Louann Sjogren

## 2021-04-19 NOTE — Addendum Note (Signed)
Addendum  created 04/19/21 1254 by Ollen Bowl, CRNA   Intraprocedure Event edited, Intraprocedure Meds edited

## 2021-04-20 ENCOUNTER — Other Ambulatory Visit (HOSPITAL_COMMUNITY): Payer: BC Managed Care – PPO

## 2021-04-21 ENCOUNTER — Encounter (HOSPITAL_COMMUNITY): Payer: BC Managed Care – PPO

## 2021-04-21 ENCOUNTER — Encounter (HOSPITAL_COMMUNITY): Payer: Self-pay | Admitting: Obstetrics & Gynecology

## 2021-04-21 LAB — SURGICAL PATHOLOGY

## 2021-04-21 MED FILL — Levonorgestrel IUD 20.1 MCG/DAY (Initial) (52 MG Total): INTRAUTERINE | Qty: 1 | Status: AC

## 2021-04-26 ENCOUNTER — Encounter (HOSPITAL_COMMUNITY): Payer: Self-pay | Admitting: Obstetrics & Gynecology

## 2021-04-26 DIAGNOSIS — F329 Major depressive disorder, single episode, unspecified: Secondary | ICD-10-CM | POA: Diagnosis not present

## 2021-05-01 ENCOUNTER — Encounter: Payer: BC Managed Care – PPO | Admitting: Obstetrics & Gynecology

## 2021-05-02 ENCOUNTER — Other Ambulatory Visit: Payer: Self-pay

## 2021-05-02 ENCOUNTER — Encounter: Payer: Self-pay | Admitting: Family Medicine

## 2021-05-02 ENCOUNTER — Ambulatory Visit (INDEPENDENT_AMBULATORY_CARE_PROVIDER_SITE_OTHER): Payer: BC Managed Care – PPO | Admitting: Family Medicine

## 2021-05-02 VITALS — BP 170/75 | HR 84 | Temp 95.8°F | Ht 66.0 in | Wt 333.0 lb

## 2021-05-02 DIAGNOSIS — E538 Deficiency of other specified B group vitamins: Secondary | ICD-10-CM | POA: Diagnosis not present

## 2021-05-02 DIAGNOSIS — E7800 Pure hypercholesterolemia, unspecified: Secondary | ICD-10-CM | POA: Diagnosis not present

## 2021-05-02 DIAGNOSIS — E559 Vitamin D deficiency, unspecified: Secondary | ICD-10-CM | POA: Diagnosis not present

## 2021-05-02 DIAGNOSIS — I10 Essential (primary) hypertension: Secondary | ICD-10-CM

## 2021-05-02 MED ORDER — EZETIMIBE 10 MG PO TABS: 10.0000 mg | ORAL_TABLET | Freq: Every day | ORAL | 2 refills | Status: DC

## 2021-05-02 MED ORDER — LOSARTAN POTASSIUM 50 MG PO TABS: 50.0000 mg | ORAL_TABLET | Freq: Every day | ORAL | 2 refills | Status: DC

## 2021-05-02 NOTE — Progress Notes (Signed)
Assessment & Plan:  1. Essential hypertension Uncontrolled. Losartan increased from 25 mg to 50 mg daily. - losartan (COZAAR) 50 MG tablet; Take 1 tablet (50 mg total) by mouth daily.  Dispense: 30 tablet; Refill: 2  2. High cholesterol Uncontrolled. Started Zetia. - ezetimibe (ZETIA) 10 MG tablet; Take 1 tablet (10 mg total) by mouth daily.  Dispense: 30 tablet; Refill: 2  3. Vitamin D deficiency Uncontrolled. Continue supplement.  4. Vitamin B12 deficiency Uncontrolled. Encouraged to purchase supplement. I will send a prescription to see if insurance will cover it.    Return in about 6 weeks (around 06/13/2021) for HTN.  Hendricks Limes, MSN, APRN, FNP-C [facility]  Subjective:    Patient ID: Kathryn Jimenez, female    DOB: Nov 13, 1970, 50 y.o.   MRN: [id number]  Patient Care Team: Loman Brooklyn, FNP as PCP - General (Family Medicine) Jacelyn Pi, MD as Consulting Physician (Endocrinology) Rogene Houston, MD as Consulting Physician (Gastroenterology) Sandford Craze, MD as Consulting Physician (Dermatology) Case, Reche Dixon, MD as Consulting Physician (Orthopedic Surgery)   Chief Complaint:  Chief Complaint  Patient presents with   Hypertension    6 week follow up     HPI: Kathryn Jimenez is a 50 y.o. female presenting on 05/02/2021 for Hypertension (6 week follow up )  Hypertension: at patient's last visit she was started on Losartan 25 mg once daily.   Vitamin D Deficiency: patient did start the vitamin D supplement that was sent in for her to take once weekly.  B12 Deficiency: patient has not started an OTC supplement as she states she cannot afford it.   Hyperlipidemia: patient reports intolerance to statins. She wanted to think about starting Zetia after her last visit.   The 10-year ASCVD risk score (Arnett DK, et al., 2019) is: 11.4%   Values used to calculate the score:     Age: 50 years     Sex: Female     Is Non-Hispanic  African American: No     Diabetic: Yes     Tobacco smoker: No     Systolic Blood Pressure: 182 mmHg     Is BP treated: Yes     HDL Cholesterol: 32 mg/dL     Total Cholesterol: 223 mg/dL  New complaints: None   Social history:  Relevant past medical, surgical, family and social history reviewed and updated as indicated. Interim medical history since our last visit reviewed.  Allergies and medications reviewed and updated.  DATA REVIEWED: CHART IN EPIC  ROS: Negative unless specifically indicated above in HPI.    Current Outpatient Medications:    ACCU-CHEK AVIVA PLUS test strip, SMARTSIG:Via Meter, Disp: , Rfl:    acetaminophen (TYLENOL) 500 MG tablet, Take 500 mg by mouth every 6 (six) hours as needed for mild pain or moderate pain., Disp: , Rfl:    albuterol (VENTOLIN HFA) 108 (90 Base) MCG/ACT inhaler, 2 PUFFS INTO THE LUNGS EVERY 6 HOURS AS NEEDED FOR WHEEZING OR SHORTNESS OF BREATH., Disp: 6.7 g, Rfl: 0   buPROPion (WELLBUTRIN XL) 150 MG 24 hr tablet, Take 150 mg by mouth daily., Disp: , Rfl:    clopidogrel (PLAVIX) 75 MG tablet, Take 1 tablet (75 mg total) by mouth daily., Disp: 90 tablet, Rfl: 1   clotrimazole (LOTRIMIN) 1 % cream, Apply 1 application topically 2 (two) times daily. (Patient taking differently: Apply 1 application topically daily as needed (Skin fungal).), Disp: 60 g, Rfl: 2   cycloSPORINE (RESTASIS)  0.05 % ophthalmic emulsion, Apply 1 drop to eye 2 (two) times daily., Disp: , Rfl:    diphenhydrAMINE (BENADRYL) 25 mg capsule, Take 25 mg by mouth every 6 (six) hours as needed for allergies., Disp: , Rfl:    docusate sodium (COLACE) 100 MG capsule, Take 2 capsules (200 mg total) by mouth at bedtime. (Patient taking differently: Take 200 mg by mouth as needed for moderate constipation or mild constipation.), Disp: , Rfl: 0   FLUoxetine (PROZAC) 40 MG capsule, Take 40 mg by mouth daily., Disp: , Rfl:    furosemide (LASIX) 20 MG tablet, Take 1 tablet (20 mg total)  by mouth daily., Disp: 90 tablet, Rfl: 1   Incontinence Supply Disposable (PREVAIL IB FULL MAT BRIEF 2XL) MISC, 1 each by Does not apply route every 2 (two) hours as needed., Disp: 96 each, Rfl: 5   insulin aspart (NOVOLOG) 100 UNIT/ML injection, Inject into the skin 3 (three) times daily before meals. Per pump, Disp: , Rfl:    ketoconazole (NIZORAL) 2 % cream, Apply 1 application topically daily. (Patient taking differently: Apply 1 application topically daily as needed for irritation (fungal).), Disp: 30 g, Rfl: 0   LORazepam (ATIVAN) 0.5 MG tablet, Take 0.25-0.5 mg by mouth daily as needed for anxiety. Takes 0.25mg - 0.50mg, 1-2 time daily as needed., Disp: , Rfl:    losartan (COZAAR) 25 MG tablet, Take 1 tablet (25 mg total) by mouth daily., Disp: 30 tablet, Rfl: 2   MAGNESIUM GLYCINATE PO, Take 200-400 mg by mouth at bedtime. 200 mg each, Disp: , Rfl:    nystatin (MYCOSTATIN/NYSTOP) powder, Apply 1 application topically daily as needed (fungal)., Disp: , Rfl:    nystatin ointment (MYCOSTATIN), Apply 1 application topically 3 (three) times daily as needed (fungal)., Disp: , Rfl:    OVER THE COUNTER MEDICATION, Take 1-2 drops by mouth daily as needed (Thydoid Function). Potassium Iodide 4 %  Iodine 2 % Distilled water, Disp: , Rfl:    potassium chloride (KLOR-CON) 10 MEQ tablet, Take 1 tablet (10 mEq total) by mouth 2 (two) times daily. (Patient taking differently: Take 20 mEq by mouth daily.), Disp: 180 tablet, Rfl: 1   valACYclovir (VALTREX) 500 MG tablet, Take 500 mg by mouth daily., Disp: , Rfl:    Vitamin D, Ergocalciferol, (DRISDOL) 1.25 MG (50000 UNIT) CAPS capsule, Take 1 capsule (50,000 Units total) by mouth every 7 (seven) days., Disp: 5 capsule, Rfl: 2   Allergies  Allergen Reactions   Actos [Pioglitazone] Swelling   Augmentin [Amoxicillin-Pot Clavulanate] Nausea Only    Causes severe diarrhea   Invokana [Canagliflozin] Other (See Comments)    Yeast infection   Lopid [Gemfibrozil]  Other (See Comments)    Burning tounge   Metoprolol Other (See Comments)    headaches   Naproxen Sodium Other (See Comments)    Patient states she felt loopy   Other Other (See Comments)   Statins Other (See Comments)    Joint pains Joint pains   Amlodipine Cough   Hctz [Hydrochlorothiazide] Rash   Lisinopril Cough   Morphine And Related Itching and Rash   Past Medical History:  Diagnosis Date   Allergy    Anginal pain (HCC)    Anxiety    Arthritis    Asthma    Bladder disorder, other    Borderline personality disorder (HCC)    Carotid artery disease (HCC) 03/05/2017   Depression    Diabetes mellitus without complication (HCC)    GERD (gastroesophageal reflux   disease)    Headache    Hyperlipidemia    Hypertension    Kidney stones    Neuromuscular disorder (HCC)    Neuropathy   PTSD (post-traumatic stress disorder)    Shortness of breath    Sleep apnea    Stroke (HCC) 2018   Thyroid disease    nodule    Past Surgical History:  Procedure Laterality Date   CARPAL TUNNEL RELEASE     left 2015   CERVICAL FUSION Bilateral 2003   C 5-6 with titanium screws   CHOLECYSTECTOMY     COLONOSCOPY WITH PROPOFOL N/A 06/24/2014   Procedure: COLONOSCOPY WITH PROPOFOL;  Surgeon: Najeeb U Rehman, MD;  Location: [facility];  Service: Endoscopy;  Laterality: N/A;  cecum time in 0810    time out   0821    total time 11 minutes   DILITATION & CURRETTAGE/HYSTROSCOPY WITH HYDROTHERMAL ABLATION N/A 04/19/2021   Procedure: DILATATION & CURETTAGE/HYSTEROSCOPY WITH HYDROTHERMAL ABLATION;  Surgeon: Ozan, Jennifer, DO;  Location: [facility];  Service: Gynecology;  Laterality: N/A;   INTRAUTERINE DEVICE (IUD) INSERTION N/A 04/19/2021   Procedure: INTRAUTERINE DEVICE (IUD) INSERTION;  Surgeon: Ozan, Jennifer, DO;  Location: [facility];  Service: Gynecology;  Laterality: N/A;   POLYPECTOMY N/A 06/24/2014   Procedure: RECTAL POLYPECTOMY;  Surgeon: Najeeb U Rehman, MD;  Location: [facility];  Service: Endoscopy;   Laterality: N/A;   SPINE SURGERY     URETHRAL DILATION  1981   WISDOM TOOTH EXTRACTION Bilateral 2001 and 1990s   top and bottom     Social History   Socioeconomic History   Marital status: Married    Spouse name: Frank   Number of children: 0   Years of education: HS   Highest education level: Not on file  Occupational History   Occupation: Disabled  Tobacco Use   Smoking status: Never   Smokeless tobacco: Never  Vaping Use   Vaping Use: Never used  Substance and Sexual Activity   Alcohol use: No    Comment: Rarely   Drug use: No   Sexual activity: Not Currently    Birth control/protection: Pill  Other Topics Concern   Not on file  Social History Narrative   Lives at home with husband.   Right-handed.   3-4 cups caffeine per day.   Social Determinants of Health   Financial Resource Strain: Medium Risk   Difficulty of Paying Living Expenses: Somewhat hard  Food Insecurity: Food Insecurity Present   Worried About Running Out of Food in the Last Year: Sometimes true   Ran Out of Food in the Last Year: Never true  Transportation Needs: No Transportation Needs   Lack of Transportation (Medical): No   Lack of Transportation (Non-Medical): No  Physical Activity: Insufficiently Active   Days of Exercise per Week: 1 day   Minutes of Exercise per Session: 10 min  Stress: No Stress Concern Present   Feeling of Stress : Only a little  Social Connections: Moderately Isolated   Frequency of Communication with Friends and Family: Once a week   Frequency of Social Gatherings with Friends and Family: Once a week   Attends Religious Services: 1 to 4 times per year   Active Member of Clubs or Organizations: No   Attends Club or Organization Meetings: Never   Marital Status: Married  Intimate Partner Violence: Not At Risk   Fear of Current or Ex-Partner: No   Emotionally Abused: No   Physically Abused: No   Sexually Abused:   No        Objective:    BP (!) 170/75   Pulse  84   Temp (!) 95.8 F (35.4 C) (Temporal)   Ht 5' 6" (1.676 m)   Wt (!) 333 lb (151 kg)   SpO2 96%   BMI 53.75 kg/m   Wt Readings from Last 3 Encounters:  05/02/21 (!) 333 lb (151 kg)  04/19/21 (!) 335 lb (152 kg)  04/18/21 (!) 335 lb (152 kg)    Physical Exam Vitals reviewed.  Constitutional:      General: She is not in acute distress.    Appearance: Normal appearance. She is morbidly obese. She is not ill-appearing, toxic-appearing or diaphoretic.  HENT:     Head: Normocephalic and atraumatic.  Eyes:     General: No scleral icterus.       Right eye: No discharge.        Left eye: No discharge.     Conjunctiva/sclera: Conjunctivae normal.  Cardiovascular:     Rate and Rhythm: Normal rate and regular rhythm.     Heart sounds: Normal heart sounds. No murmur heard.   No friction rub. No gallop.  Pulmonary:     Effort: Pulmonary effort is normal. No respiratory distress.     Breath sounds: Normal breath sounds. No stridor. No wheezing, rhonchi or rales.  Musculoskeletal:        General: Normal range of motion.     Cervical back: Normal range of motion.  Skin:    General: Skin is warm and dry.     Capillary Refill: Capillary refill takes less than 2 seconds.  Neurological:     General: No focal deficit present.     Mental Status: She is alert and oriented to person, place, and time. Mental status is at baseline.  Psychiatric:        Mood and Affect: Mood normal.        Behavior: Behavior normal.        Thought Content: Thought content normal.        Judgment: Judgment normal.    Lab Results  Component Value Date   TSH 2.320 12/18/2016   Lab Results  Component Value Date   WBC 7.2 04/18/2021   HGB 14.0 04/18/2021   HCT 43.2 04/18/2021   MCV 89.4 04/18/2021   PLT 375 04/18/2021   Lab Results  Component Value Date   NA 136 04/18/2021   K 4.3 04/18/2021   CO2 24 04/18/2021   GLUCOSE 137 (H) 04/18/2021   BUN 7 04/18/2021   CREATININE 0.94 04/18/2021    BILITOT 0.3 03/07/2021   ALKPHOS 95 03/07/2021   AST 12 03/07/2021   ALT 12 03/07/2021   PROT 7.1 03/07/2021   ALBUMIN 4.5 03/07/2021   CALCIUM 9.0 04/18/2021   ANIONGAP 9 04/18/2021   EGFR 72 03/07/2021   Lab Results  Component Value Date   CHOL 223 (H) 03/07/2021   Lab Results  Component Value Date   HDL 32 (L) 03/07/2021   Lab Results  Component Value Date   LDLCALC 170 (H) 03/07/2021   Lab Results  Component Value Date   TRIG 117 03/07/2021   Lab Results  Component Value Date   CHOLHDL 7.0 (H) 03/07/2021   Lab Results  Component Value Date   HGBA1C 8.6 (H) 04/18/2021            

## 2021-05-07 DIAGNOSIS — E538 Deficiency of other specified B group vitamins: Secondary | ICD-10-CM | POA: Insufficient documentation

## 2021-05-07 MED ORDER — VITAMIN B-12 1000 MCG PO TABS: 1000.0000 ug | ORAL_TABLET | Freq: Every day | ORAL | 2 refills | Status: DC

## 2021-05-11 ENCOUNTER — Other Ambulatory Visit (HOSPITAL_COMMUNITY): Payer: BC Managed Care – PPO

## 2021-06-02 ENCOUNTER — Other Ambulatory Visit: Payer: Self-pay | Admitting: Family Medicine

## 2021-06-02 DIAGNOSIS — E559 Vitamin D deficiency, unspecified: Secondary | ICD-10-CM

## 2021-06-05 DIAGNOSIS — E109 Type 1 diabetes mellitus without complications: Secondary | ICD-10-CM | POA: Diagnosis not present

## 2021-06-13 ENCOUNTER — Ambulatory Visit (INDEPENDENT_AMBULATORY_CARE_PROVIDER_SITE_OTHER): Payer: BC Managed Care – PPO | Admitting: Family Medicine

## 2021-06-13 ENCOUNTER — Encounter: Payer: Self-pay | Admitting: Family Medicine

## 2021-06-13 ENCOUNTER — Other Ambulatory Visit: Payer: Self-pay

## 2021-06-13 VITALS — BP 149/82 | HR 82 | Temp 97.2°F | Resp 20 | Ht 66.0 in | Wt 339.0 lb

## 2021-06-13 DIAGNOSIS — E538 Deficiency of other specified B group vitamins: Secondary | ICD-10-CM

## 2021-06-13 DIAGNOSIS — E1142 Type 2 diabetes mellitus with diabetic polyneuropathy: Secondary | ICD-10-CM

## 2021-06-13 DIAGNOSIS — E7800 Pure hypercholesterolemia, unspecified: Secondary | ICD-10-CM

## 2021-06-13 DIAGNOSIS — I10 Essential (primary) hypertension: Secondary | ICD-10-CM | POA: Diagnosis not present

## 2021-06-13 DIAGNOSIS — B372 Candidiasis of skin and nail: Secondary | ICD-10-CM

## 2021-06-13 DIAGNOSIS — E559 Vitamin D deficiency, unspecified: Secondary | ICD-10-CM | POA: Diagnosis not present

## 2021-06-13 DIAGNOSIS — Z794 Long term (current) use of insulin: Secondary | ICD-10-CM

## 2021-06-13 DIAGNOSIS — L409 Psoriasis, unspecified: Secondary | ICD-10-CM

## 2021-06-13 MED ORDER — LOSARTAN POTASSIUM 100 MG PO TABS
100.0000 mg | ORAL_TABLET | Freq: Every day | ORAL | 2 refills | Status: DC
Start: 2021-06-13 — End: 2021-08-30

## 2021-06-13 MED ORDER — FLUCONAZOLE 150 MG PO TABS: 150.0000 mg | ORAL_TABLET | ORAL | 0 refills | Status: AC

## 2021-06-13 MED ORDER — CLOBETASOL PROPIONATE 0.05 % EX CREA
1.0000 | TOPICAL_CREAM | Freq: Two times a day (BID) | CUTANEOUS | 0 refills | Status: DC | PRN
Start: 2021-06-13 — End: 2023-05-16

## 2021-06-13 NOTE — Progress Notes (Addendum)
Assessment & Plan:  1. Essential hypertension Improving, but still uncontrolled. Losartan increased from 50 mg to 100 mg daily.  - losartan (COZAAR) 100 MG tablet; Take 1 tablet (100 mg total) by mouth daily.  Dispense: 30 tablet; Refill: 2  2. High cholesterol Patient to continue Zetia. Labs not reassessed today since she has only been on the medication x6 weeks.   3. Vitamin D deficiency Continue vitamin D supplement.  4. Vitamin B12 deficiency Unable to afford vitamin B12 supplement.  5. Type 2 diabetes mellitus with diabetic polyneuropathy, with long-term current use of insulin ([place]) Patient to keep appointment with Kathryn Jimenez, endocrinologist, next month.  6. Scalp psoriasis Steroid cream provided. - clobetasol cream (TEMOVATE) 0.05 %; Apply 1 application topically 2 (two) times daily as needed (scalp psoriasis).  Dispense: 30 g; Refill: 0  7. Candidal intertrigo All fungal creams discontinued. Started Diflucan weekly x4 weeks to clear up current fungal infection. Discussed using Nystatin as needed in a light layer. Recommended if she is having pain/irritation where she can't see to schedule an appointment so we can determine if it is the fungal rash or a herpes outbreak.    - fluconazole (DIFLUCAN) 150 MG tablet; Take 1 tablet (150 mg total) by mouth once a week for 4 doses.  Dispense: 4 tablet; Refill: 0   Return in about 2 months (around 08/13/2021) for follow-up of chronic medication conditions.  Kathryn Limes, MSN, APRN, FNP-C [facility]  Subjective:    Patient ID: Kathryn Jimenez, female    DOB: [birth year], 50 y.o.   MRN: [id number]  Patient Care Team: Loman Brooklyn, FNP as PCP - General (Family Medicine) Jacelyn Pi, MD as Consulting Physician (Endocrinology) Rogene Houston, MD as Consulting Physician (Gastroenterology) Sandford Craze, MD as Consulting Physician (Dermatology) Case, Reche Dixon, MD as Consulting Physician (Orthopedic  Surgery)   Chief Complaint:  Chief Complaint  Patient presents with  . Medical Management of Chronic Issues    HPI: Kathryn Jimenez is a 50 y.o. female presenting on 06/13/2021 for Medical Management of Chronic Issues  Hypertension At patient's last visit her Losartan was increased to 50 mg once daily. She does monitor her blood pressure at home, but brought her cuff with her today and it is not accurate when compared to ours.   Hyperlipidemia Patient was started on Zetia at our last visit six weeks ago.  Vitamin D Deficiency Patient is taking her once weekly vitamin d supplement.  Vitamin B12 Deficiency Patient is not taking a vitamin B12 supplement as insurance would not pay for the prescription I sent over and she cannot afford to purchase over the counter.   Diabetes Managed by Kathryn Jimenez who she has an upcoming appointment with next month.   New complaints: Patient is requesting a refill on the cream she applies to her scalp psoriasis.   She has numerous fungal creams/powders on her medication list, which she applies to the fungal rash in her groin area from her abdominal pannus. She states she hasn't ever really been able to get this cleared up.   In patient's explanation regarding her fungal rash she reports it started when she was also diagnosed with genital herpes. She states she had the most terrible rash that started when she started taking Invokana. It was so bad she couldn't even sit down. She had tried a couple different things for the rash and couldn't get it to improve until she took oral diflucan. She had gone to  her OBGYN at the time who did a lot of lab work and diagnosed her with herpes. She is unsure if she actually has outbreaks of herpes or the fungal rash because she cannot see down there.    Social history:  Relevant past medical, surgical, family and social history reviewed and updated as indicated. Interim medical history since our last visit  reviewed.  Allergies and medications reviewed and updated.  DATA REVIEWED: CHART IN EPIC  ROS: Negative unless specifically indicated above in HPI.    Current Outpatient Medications:  .  ACCU-CHEK AVIVA PLUS test strip, SMARTSIG:Via Meter, Disp: , Rfl:  .  acetaminophen (TYLENOL) 500 MG tablet, Take 500 mg by mouth every 6 (six) hours as needed for mild pain or moderate pain., Disp: , Rfl:  .  albuterol (VENTOLIN HFA) 108 (90 Base) MCG/ACT inhaler, 2 PUFFS INTO THE LUNGS EVERY 6 HOURS AS NEEDED FOR WHEEZING OR SHORTNESS OF BREATH., Disp: 6.7 g, Rfl: 0 .  buPROPion (WELLBUTRIN XL) 150 MG 24 hr tablet, Take 150 mg by mouth daily., Disp: , Rfl:  .  clopidogrel (PLAVIX) 75 MG tablet, Take 1 tablet (75 mg total) by mouth daily., Disp: 90 tablet, Rfl: 1 .  clotrimazole (LOTRIMIN) 1 % cream, Apply 1 application topically 2 (two) times daily. (Patient taking differently: Apply 1 application topically daily as needed (Skin fungal).), Disp: 60 g, Rfl: 2 .  cycloSPORINE (RESTASIS) 0.05 % ophthalmic emulsion, Apply 1 drop to eye 2 (two) times daily., Disp: , Rfl:  .  diphenhydrAMINE (BENADRYL) 25 mg capsule, Take 25 mg by mouth every 6 (six) hours as needed for allergies., Disp: , Rfl:  .  docusate sodium (COLACE) 100 MG capsule, Take 2 capsules (200 mg total) by mouth at bedtime. (Patient taking differently: Take 200 mg by mouth as needed for moderate constipation or mild constipation.), Disp: , Rfl: 0 .  ezetimibe (ZETIA) 10 MG tablet, Take 1 tablet (10 mg total) by mouth daily., Disp: 30 tablet, Rfl: 2 .  FLUoxetine (PROZAC) 40 MG capsule, Take 40 mg by mouth daily., Disp: , Rfl:  .  furosemide (LASIX) 20 MG tablet, Take 1 tablet (20 mg total) by mouth daily., Disp: 90 tablet, Rfl: 1 .  Incontinence Supply Disposable (PREVAIL IB FULL MAT BRIEF 2XL) MISC, 1 each by Does not apply route every 2 (two) hours as needed., Disp: 96 each, Rfl: 5 .  insulin aspart (NOVOLOG) 100 UNIT/ML injection, Inject into  the skin 3 (three) times daily before meals. Per pump, Disp: , Rfl:  .  ketoconazole (NIZORAL) 2 % cream, Apply 1 application topically daily. (Patient taking differently: Apply 1 application topically daily as needed for irritation (fungal).), Disp: 30 g, Rfl: 0 .  LORazepam (ATIVAN) 0.5 MG tablet, Take 0.25-0.5 mg by mouth daily as needed for anxiety. Takes 0.15m - 0.547m 1-2 time daily as needed., Disp: , Rfl:  .  losartan (COZAAR) 50 MG tablet, Take 1 tablet (50 mg total) by mouth daily., Disp: 30 tablet, Rfl: 2 .  MAGNESIUM GLYCINATE PO, Take 200-400 mg by mouth at bedtime. 200 mg each, Disp: , Rfl:  .  nystatin (MYCOSTATIN/NYSTOP) powder, Apply 1 application topically daily as needed (fungal)., Disp: , Rfl:  .  nystatin ointment (MYCOSTATIN), Apply 1 application topically 3 (three) times daily as needed (fungal)., Disp: , Rfl:  .  OVER THE COUNTER MEDICATION, Take 1-2 drops by mouth daily as needed (Thydoid Function). Potassium Iodide 4 %  Iodine 2 % Distilled water, Disp: ,  Rfl:  .  potassium chloride (KLOR-CON) 10 MEQ tablet, Take 1 tablet (10 mEq total) by mouth 2 (two) times daily. (Patient taking differently: Take 20 mEq by mouth daily.), Disp: 180 tablet, Rfl: 1 .  vitamin B-12 (CYANOCOBALAMIN) 1000 MCG tablet, Take 1 tablet (1,000 mcg total) by mouth daily., Disp: 30 tablet, Rfl: 2 .  Vitamin D, Ergocalciferol, (DRISDOL) 1.25 MG (50000 UNIT) CAPS capsule, TAKE 1 CAPSULE EVERY 7 DAYS., Disp: 4 capsule, Rfl: 0   Allergies  Allergen Reactions  . Actos [Pioglitazone] Swelling  . Augmentin [Amoxicillin-Pot Clavulanate] Nausea Only    Causes severe diarrhea  . Invokana [Canagliflozin] Other (See Comments)    Yeast infection  . Lopid [Gemfibrozil] Other (See Comments)    Burning tounge  . Metoprolol Other (See Comments)    headaches  . Naproxen Sodium Other (See Comments)    Patient states she felt loopy  . Other Other (See Comments)  . Statins Other (See Comments)    Joint  pains Joint pains  . Amlodipine Cough  . Hctz [Hydrochlorothiazide] Rash  . Lisinopril Cough  . Morphine And Related Itching and Rash   Past Medical History:  Diagnosis Date  . Allergy   . Anginal pain ([place])   . Anxiety   . Arthritis   . Asthma   . Bladder disorder, other   . Borderline personality disorder ([place])   . Carotid artery disease ([place]) 03/05/2017  . Depression   . Diabetes mellitus without complication ([place])   . GERD (gastroesophageal reflux disease)   . Headache   . Hyperlipidemia   . Hypertension   . Kidney stones   . Neuromuscular disorder ([facility])    Neuropathy  . PTSD (post-traumatic stress disorder)   . Shortness of breath   . Sleep apnea   . Stroke ([place]) 2018  . Thyroid disease    nodule    Past Surgical History:  Procedure Laterality Date  . CARPAL TUNNEL RELEASE     left 2015  . CERVICAL FUSION Bilateral 2003   C 5-6 with titanium screws  . CHOLECYSTECTOMY    . COLONOSCOPY WITH PROPOFOL N/A 06/24/2014   Procedure: COLONOSCOPY WITH PROPOFOL;  Surgeon: Rogene Houston, MD;  Location: [facility];  Service: Endoscopy;  Laterality: N/A;  cecum time in 0810    time out   0821    total time 11 minutes  . DILITATION & CURRETTAGE/HYSTROSCOPY WITH HYDROTHERMAL ABLATION N/A 04/19/2021   Procedure: DILATATION & CURETTAGE/HYSTEROSCOPY WITH HYDROTHERMAL ABLATION;  Surgeon: Janyth Pupa, DO;  Location: [facility];  Service: Gynecology;  Laterality: N/A;  . INTRAUTERINE DEVICE (IUD) INSERTION N/A 04/19/2021   Procedure: INTRAUTERINE DEVICE (IUD) INSERTION;  Surgeon: Janyth Pupa, DO;  Location: [facility];  Service: Gynecology;  Laterality: N/A;  . POLYPECTOMY N/A 06/24/2014   Procedure: RECTAL POLYPECTOMY;  Surgeon: Rogene Houston, MD;  Location: [facility];  Service: Endoscopy;  Laterality: N/A;  . SPINE SURGERY    . URETHRAL DILATION  1981  . WISDOM TOOTH EXTRACTION Bilateral 2001 and 1990s   top and bottom     Social History   Socioeconomic History  . Marital status:  Married    Spouse name: [name]  . Number of children: 0  . Years of education: HS  . Highest education level: Not on file  Occupational History  . Occupation: Disabled  Tobacco Use  . Smoking status: Never  . Smokeless tobacco: Never  Vaping Use  . Vaping Use: Never used  Substance and Sexual  Activity  . Alcohol use: No    Comment: Rarely  . Drug use: No  . Sexual activity: Not Currently    Birth control/protection: Pill  Other Topics Concern  . Not on file  Social History Narrative   Lives at home with husband.   Right-handed.   3-4 cups caffeine per day.   Social Determinants of Health   Financial Resource Strain: Medium Risk  . Difficulty of Paying Living Expenses: Somewhat hard  Food Insecurity: Food Insecurity Present  . Worried About Charity fundraiser in the Last Year: Sometimes true  . Ran Out of Food in the Last Year: Never true  Transportation Needs: No Transportation Needs  . Lack of Transportation (Medical): No  . Lack of Transportation (Non-Medical): No  Physical Activity: Insufficiently Active  . Days of Exercise per Week: 1 day  . Minutes of Exercise per Session: 10 min  Stress: No Stress Concern Present  . Feeling of Stress : Only a little  Social Connections: Moderately Isolated  . Frequency of Communication with Friends and Family: Once a week  . Frequency of Social Gatherings with Friends and Family: Once a week  . Attends Religious Services: 1 to 4 times per year  . Active Member of Clubs or Organizations: No  . Attends Archivist Meetings: Never  . Marital Status: Married  Human resources officer Violence: Not At Risk  . Fear of Current or Ex-Partner: No  . Emotionally Abused: No  . Physically Abused: No  . Sexually Abused: No        Objective:    BP (!) 149/82   Pulse 82   Temp (!) 97.2 F (36.2 C) (Temporal)   Resp 20   Ht 5' 6"  (1.676 m)   Wt (!) 339 lb (153.8 kg)   SpO2 97%   BMI 54.72 kg/m   Wt Readings from Last 3  Encounters:  06/13/21 (!) 339 lb (153.8 kg)  05/02/21 (!) 333 lb (151 kg)  04/19/21 (!) 335 lb (152 kg)    Physical Exam Vitals reviewed.  Constitutional:      General: She is not in acute distress.    Appearance: Normal appearance. She is morbidly obese. She is not ill-appearing, toxic-appearing or diaphoretic.  HENT:     Head: Normocephalic and atraumatic.  Eyes:     General: No scleral icterus.       Right eye: No discharge.        Left eye: No discharge.     Conjunctiva/sclera: Conjunctivae normal.  Cardiovascular:     Rate and Rhythm: Normal rate and regular rhythm.     Heart sounds: Normal heart sounds. No murmur heard.   No friction rub. No gallop.  Pulmonary:     Effort: Pulmonary effort is normal. No respiratory distress.     Breath sounds: Normal breath sounds. No stridor. No wheezing, rhonchi or rales.  Musculoskeletal:        General: Normal range of motion.     Cervical back: Normal range of motion.  Skin:    General: Skin is warm and dry.     Capillary Refill: Capillary refill takes less than 2 seconds.     Comments: Erythematous yeast rash in groin folds (R>L).  Neurological:     General: No focal deficit present.     Mental Status: She is alert and oriented to person, place, and time. Mental status is at baseline.  Psychiatric:        Mood and  Affect: Mood normal.        Behavior: Behavior normal.        Thought Content: Thought content normal.        Judgment: Judgment normal.    Lab Results  Component Value Date   TSH 2.320 12/18/2016   Lab Results  Component Value Date   WBC 7.2 04/18/2021   HGB 14.0 04/18/2021   HCT 43.2 04/18/2021   MCV 89.4 04/18/2021   PLT 375 04/18/2021   Lab Results  Component Value Date   NA 136 04/18/2021   K 4.3 04/18/2021   CO2 24 04/18/2021   GLUCOSE 137 (H) 04/18/2021   BUN 7 04/18/2021   CREATININE 0.94 04/18/2021   BILITOT 0.3 03/07/2021   ALKPHOS 95 03/07/2021   AST 12 03/07/2021   ALT 12 03/07/2021    PROT 7.1 03/07/2021   ALBUMIN 4.5 03/07/2021   CALCIUM 9.0 04/18/2021   ANIONGAP 9 04/18/2021   EGFR 72 03/07/2021   Lab Results  Component Value Date   CHOL 223 (H) 03/07/2021   Lab Results  Component Value Date   HDL 32 (L) 03/07/2021   Lab Results  Component Value Date   LDLCALC 170 (H) 03/07/2021   Lab Results  Component Value Date   TRIG 117 03/07/2021   Lab Results  Component Value Date   CHOLHDL 7.0 (H) 03/07/2021   Lab Results  Component Value Date   HGBA1C 8.6 (H) 04/18/2021

## 2021-06-16 ENCOUNTER — Encounter: Payer: Self-pay | Admitting: Obstetrics & Gynecology

## 2021-06-16 ENCOUNTER — Other Ambulatory Visit: Payer: Self-pay

## 2021-06-16 ENCOUNTER — Ambulatory Visit (INDEPENDENT_AMBULATORY_CARE_PROVIDER_SITE_OTHER): Payer: BC Managed Care – PPO | Admitting: Obstetrics & Gynecology

## 2021-06-16 VITALS — BP 153/59 | HR 87 | Ht 66.0 in | Wt 341.8 lb

## 2021-06-16 DIAGNOSIS — Z9889 Other specified postprocedural states: Secondary | ICD-10-CM

## 2021-06-16 DIAGNOSIS — Z30431 Encounter for routine checking of intrauterine contraceptive device: Secondary | ICD-10-CM

## 2021-06-16 NOTE — Progress Notes (Signed)
   GYN VISIT Patient name: Kathryn Jimenez MRN [id number]  Date of birth: 01-22-1971 Chief Complaint:   Post-op Follow-up  History of Present Illness:   Kathryn Jimenez is a 50 y.o. G0P0000  female being seen today for follow up- s/p [facility], D&C, HTA ablation, IUD insertion- 04/19/2021  Today she notes that she had spotting for 2 weeks after the procedure and then no bleeding since that time. Denies pelvic or abdominal pain.  Overall doing well and reports no acute complaints.  Of note, she did have some premenstrual symptoms- breast tenderness, but again, no menses.  No LMP recorded (lmp unknown). Patient has had an ablation.  Depression screen [facility] 2/9 06/13/2021 05/02/2021 04/03/2021 03/07/2021 01/12/2020  Decreased Interest 1 1 1 2 1   Down, Depressed, Hopeless 1 1 1 1 1   PHQ - 2 Score 2 2 2 3 2   Altered sleeping 1 1 1 2 1   Tired, decreased energy 1 1 1 2 1   Change in appetite 1 1 1 1 1   Feeling bad or failure about yourself  1 0 1 0 1  Trouble concentrating 1 1 1 1 1   Moving slowly or fidgety/restless 1 1 1 1 1   Suicidal thoughts 0 0 0 0 0  PHQ-9 Score 8 7 8 10 8   Difficult doing work/chores Somewhat difficult Somewhat difficult - Very difficult Somewhat difficult  Some recent data might be hidden     Review of Systems:   Pertinent items are noted in HPI Denies fever/chills, dizziness, headaches, visual disturbances, fatigue, shortness of breath, chest pain, abdominal pain, vomiting, no problems with periods, bowel movements, urination unless otherwise stated above.  Pertinent History Reviewed:  Reviewed past medical,surgical, social, obstetrical and family history.  Reviewed problem list, medications and allergies. Physical Assessment:   Vitals:   06/16/21 0917  BP: (!) 153/59  Pulse: 87  Weight: (!) 341 lb 12.8 oz (155 kg)  Height: 5\' 6"  (1.676 m)  Body mass index is 55.17 kg/m.       Physical Examination:   General appearance: alert, well appearing, and in no  distress  Psych: mood appropriate, normal affect  Skin: warm & dry   Cardiovascular: normal heart rate noted  Respiratory: normal respiratory effort, no distress  Abdomen: soft, non-tender morbidly obese  Pelvic: normal external genitalia, vulva, vagina, cervix- strings visualized at os **large speculum used  Extremities: 1+ edema  Chaperone: Information systems manager    Assessment & Plan:  1) IUD in place - device in proper location  2) AUB- significant improvement s/p ablation and IUD placement -follow up as needed or in 17yr for annual  3) Morbid obesity -pt not interested in gastric bypass -working on [employer] management with PCP   No orders of the defined types were placed in this encounter.   No follow-ups on file.   Janyth Pupa, DO Attending [place], [facility] for [employer], [place]

## 2021-06-18 ENCOUNTER — Encounter: Payer: Self-pay | Admitting: Family Medicine

## 2021-06-28 ENCOUNTER — Other Ambulatory Visit: Payer: Self-pay | Admitting: Family Medicine

## 2021-06-28 DIAGNOSIS — E559 Vitamin D deficiency, unspecified: Secondary | ICD-10-CM

## 2021-06-29 DIAGNOSIS — E1165 Type 2 diabetes mellitus with hyperglycemia: Secondary | ICD-10-CM | POA: Diagnosis not present

## 2021-06-29 DIAGNOSIS — E669 Obesity, unspecified: Secondary | ICD-10-CM | POA: Diagnosis not present

## 2021-06-29 DIAGNOSIS — I10 Essential (primary) hypertension: Secondary | ICD-10-CM | POA: Diagnosis not present

## 2021-06-29 DIAGNOSIS — G609 Hereditary and idiopathic neuropathy, unspecified: Secondary | ICD-10-CM | POA: Diagnosis not present

## 2021-07-14 DIAGNOSIS — E119 Type 2 diabetes mellitus without complications: Secondary | ICD-10-CM | POA: Diagnosis not present

## 2021-08-02 ENCOUNTER — Other Ambulatory Visit: Payer: Self-pay | Admitting: Family Medicine

## 2021-08-02 DIAGNOSIS — E7800 Pure hypercholesterolemia, unspecified: Secondary | ICD-10-CM

## 2021-08-15 ENCOUNTER — Telehealth: Payer: Self-pay | Admitting: Obstetrics & Gynecology

## 2021-08-15 ENCOUNTER — Other Ambulatory Visit: Payer: Self-pay

## 2021-08-15 MED ORDER — VALACYCLOVIR HCL 500 MG PO TABS: 500.0000 mg | ORAL_TABLET | Freq: Every day | ORAL | 6 refills | Status: AC

## 2021-08-15 NOTE — Telephone Encounter (Signed)
Patient wants to know if you can take over the medicine from [facility]. It is the valtrex 500mg  and she uses [employer].

## 2021-08-15 NOTE — Telephone Encounter (Signed)
Refill request sent to Dr Nelda Marseille, awaiting approval.

## 2021-08-17 DIAGNOSIS — E119 Type 2 diabetes mellitus without complications: Secondary | ICD-10-CM | POA: Diagnosis not present

## 2021-08-23 ENCOUNTER — Ambulatory Visit: Payer: BC Managed Care – PPO | Admitting: Family Medicine

## 2021-08-28 ENCOUNTER — Encounter: Payer: Self-pay | Admitting: Family Medicine

## 2021-08-30 ENCOUNTER — Ambulatory Visit (INDEPENDENT_AMBULATORY_CARE_PROVIDER_SITE_OTHER): Payer: BC Managed Care – PPO | Admitting: Family Medicine

## 2021-08-30 ENCOUNTER — Encounter: Payer: Self-pay | Admitting: Family Medicine

## 2021-08-30 VITALS — BP 177/94 | HR 73 | Temp 97.4°F | Ht 66.0 in | Wt 344.6 lb

## 2021-08-30 DIAGNOSIS — I10 Essential (primary) hypertension: Secondary | ICD-10-CM

## 2021-08-30 DIAGNOSIS — Z6841 Body Mass Index (BMI) 40.0 and over, adult: Secondary | ICD-10-CM

## 2021-08-30 DIAGNOSIS — E7800 Pure hypercholesterolemia, unspecified: Secondary | ICD-10-CM | POA: Diagnosis not present

## 2021-08-30 DIAGNOSIS — H538 Other visual disturbances: Secondary | ICD-10-CM

## 2021-08-30 DIAGNOSIS — I679 Cerebrovascular disease, unspecified: Secondary | ICD-10-CM

## 2021-08-30 DIAGNOSIS — E559 Vitamin D deficiency, unspecified: Secondary | ICD-10-CM | POA: Diagnosis not present

## 2021-08-30 DIAGNOSIS — H547 Unspecified visual loss: Secondary | ICD-10-CM

## 2021-08-30 DIAGNOSIS — B372 Candidiasis of skin and nail: Secondary | ICD-10-CM

## 2021-08-30 MED ORDER — CLOPIDOGREL BISULFATE 75 MG PO TABS: 75.0000 mg | ORAL_TABLET | Freq: Every day | ORAL | 1 refills | Status: DC

## 2021-08-30 MED ORDER — POTASSIUM CHLORIDE ER 10 MEQ PO TBCR: 20.0000 meq | EXTENDED_RELEASE_TABLET | Freq: Every day | ORAL | 1 refills | Status: DC

## 2021-08-30 MED ORDER — LOSARTAN POTASSIUM 100 MG PO TABS: 100.0000 mg | ORAL_TABLET | Freq: Every day | ORAL | 1 refills | Status: DC

## 2021-08-30 NOTE — Progress Notes (Signed)
Assessment & Plan:  1. Essential hypertension Uncontrolled. Unagreeable to a medication adjustment at this time. She will discuss the hypertension clinic with her husband. Education provided on the DASH diet. Healthy eating and exercise encouraged. - losartan (COZAAR) 100 MG tablet; Take 1 tablet (100 mg total) by mouth daily.  Dispense: 90 tablet; Refill: 1 - potassium chloride (KLOR-CON) 10 MEQ tablet; Take 2 tablets (20 mEq total) by mouth daily.  Dispense: 180 tablet; Refill: 1 - CBC with Differential/Platelet - CMP14+EGFR - Lipid panel  2. Small vessel disease, cerebrovascular Continue Plavix. Unable to tolerate statins or Zetia. - clopidogrel (PLAVIX) 75 MG tablet; Take 1 tablet (75 mg total) by mouth daily.  Dispense: 90 tablet; Refill: 1 - CBC with Differential/Platelet - CMP14+EGFR - Lipid panel  3. High cholesterol Uncontrolled. Unable to tolerate statins or Zetia. Suggested Repatha, but patient declined at this time stating she does not want to add any new medications.  - CMP14+EGFR - Lipid panel  4. Morbid obesity with BMI of 50.0-59.9, adult ([place]) Encouraged healthy eating and exercise. Patient has gained 4 lbs since her last visit 2.5 months ago. - CBC with Differential/Platelet - CMP14+EGFR - Lipid panel  5. Vitamin D deficiency Labs to assess for improvement. - VITAMIN D 25 Hydroxy (Vit-D Deficiency, Fractures)  6. Candidal intertrigo Declined further treatment with Diflucan.  7-8. Flashing lights seen/Vision impairment Head CT ordered STAT to rule out stroke due to uncontrolled hypertension, uncontrolled hyperlipidemia, history of stroke, and diabetes. Advised patient to go over to [place] and see Dr. Truman Hayward today as she will work her in instead of waiting until February.  - CT HEAD WO CONTRAST (5MM); Future   I have spent 50 minutes with patient discussing uncontrolled medical conditions, treatment options, and further work-up for vision  concerns. Add any referrals if done, test review, discussing pt with other providers (specialist) if any applied.  Return as directed after labs result.  Lucile Crater, NP Student  I personally was present during the history, physical exam, and medical decision-making activities of this service and have verified that the service and findings are accurately documented in the nurse practitioner student's note.  Hendricks Limes, MSN, APRN, FNP-C [facility]  Subjective:    Patient ID: Kathryn Jimenez, female    DOB: Dec 29, 1970, 51 y.o.   MRN: [id number]  Patient Care Team: Loman Brooklyn, FNP as PCP - General (Family Medicine) Jacelyn Pi, MD as Consulting Physician (Endocrinology) Rogene Houston, MD as Consulting Physician (Gastroenterology) Sandford Craze, MD as Consulting Physician (Dermatology) Case, Reche Dixon, MD as Consulting Physician (Orthopedic Surgery)   Chief Complaint:  Chief Complaint  Patient presents with   Hypertension    2 month follow up of chronic medical conditions    Eye Pain    Patient states that the last few weeks her bilateral eyes feel like they are jumping and throbbing. Apt with eye doctor in Feb.     HPI: Kathryn Jimenez is a 51 y.o. female presenting on 08/30/2021 for Hypertension (2 month follow up of chronic medical conditions ) and Eye Pain (Patient states that the last few weeks her bilateral eyes feel like they are jumping and throbbing. Apt with eye doctor in Feb. )  Hypertension At patient's last visit her Losartan was increased to 100 mg once daily. She provided a list of 4 readings taken by an RN while her husband was in the hospital, which are 130/55, 142/71, 140/58, and 121/45. She  reports that she has a lot of side effects related to blood pressure medications and that she believes that her elevated readings are due to the stress in her life. She is not interested in adding an additional medication or seeing a  hypertension specialist, because she believes these are expected findings due to her stress and other health problems.   Hyperlipidemia Patient was started on Zetia 3 months ago due to hyperlipidemia and inability to tolerate statins. She states she has stopped taking the medicine because it gave her headaches and caused fatigue, which resolved when she stopped the medication.  The 10-year ASCVD risk score (Arnett DK, et al., 2019) is: 12.3%   Values used to calculate the score:     Age: 23 years     Sex: Female     Is Non-Hispanic African American: No     Diabetic: Yes     Tobacco smoker: No     Systolic Blood Pressure: 030 mmHg     Is BP treated: Yes     HDL Cholesterol: 32 mg/dL     Total Cholesterol: 223 mg/dL   Diabetes Managed by Dr. Chalmers Cater. She reports an A1c of 7.1 in November 2022, improved from 8.5 in August 2022.  Rash At her last visit, she was noted to have severe fungal growth on her abdominal folds and perineum. She states it has not cleared up completely since finishing diflucan but it is better than it was before.   New complaints: She states her eyes "feel like they vibrate" and it radiates into the tissue around her eyes. She states she already has issues with blurred vision due to using restasis so she can't say definitively if it is causing blurred vision. She also states it appears like bright strobe lights flashing, but it is sometimes one eye or both eyes. She states she noticed it was worse when she increased her losartan. She also states it worsens with stress and certain activities, but it does not occur all the time. She did have a stroke in 2018. She has an appointment with her optometrist in February and states this was the first available "urgent appointment".   Social history:  Relevant past medical, surgical, family and social history reviewed and updated as indicated. Interim medical history since our last visit reviewed.  Allergies and medications  reviewed and updated.  DATA REVIEWED: CHART IN EPIC  ROS: Negative unless specifically indicated above in HPI.    Current Outpatient Medications:    ACCU-CHEK AVIVA PLUS test strip, SMARTSIG:Via Meter, Disp: , Rfl:    acetaminophen (TYLENOL) 500 MG tablet, Take 500 mg by mouth every 6 (six) hours as needed for mild pain or moderate pain., Disp: , Rfl:    albuterol (VENTOLIN HFA) 108 (90 Base) MCG/ACT inhaler, 2 PUFFS INTO THE LUNGS EVERY 6 HOURS AS NEEDED FOR WHEEZING OR SHORTNESS OF BREATH., Disp: 6.7 g, Rfl: 0   buPROPion (WELLBUTRIN XL) 150 MG 24 hr tablet, Take 150 mg by mouth daily., Disp: , Rfl:    clobetasol cream (TEMOVATE) 1.31 %, Apply 1 application topically 2 (two) times daily as needed (scalp psoriasis)., Disp: 30 g, Rfl: 0   clopidogrel (PLAVIX) 75 MG tablet, Take 1 tablet (75 mg total) by mouth daily., Disp: 90 tablet, Rfl: 1   cycloSPORINE (RESTASIS) 0.05 % ophthalmic emulsion, Apply 1 drop to eye 2 (two) times daily., Disp: , Rfl:    diphenhydrAMINE (BENADRYL) 25 mg capsule, Take 25 mg by mouth every 6 (six)  hours as needed for allergies., Disp: , Rfl:    docusate sodium (COLACE) 100 MG capsule, Take 2 capsules (200 mg total) by mouth at bedtime. (Patient taking differently: Take 200 mg by mouth as needed for moderate constipation or mild constipation.), Disp: , Rfl: 0   FLUoxetine (PROZAC) 40 MG capsule, Take 40 mg by mouth daily., Disp: , Rfl:    furosemide (LASIX) 20 MG tablet, Take 1 tablet (20 mg total) by mouth daily., Disp: 90 tablet, Rfl: 1   Incontinence Supply Disposable (PREVAIL IB FULL MAT BRIEF 2XL) MISC, 1 each by Does not apply route every 2 (two) hours as needed., Disp: 96 each, Rfl: 5   insulin aspart (NOVOLOG) 100 UNIT/ML injection, Inject into the skin 3 (three) times daily before meals. Per pump, Disp: , Rfl:    levonorgestrel (LILETTA, 52 MG,) 20.1 MCG/DAY IUD, 1 each by Intrauterine route once. Inserted 04/19/21, Disp: , Rfl:    LORazepam (ATIVAN) 0.5 MG  tablet, Take 0.25-0.5 mg by mouth daily as needed for anxiety. Takes 0.58m - 0.550m 1-2 time daily as needed., Disp: , Rfl:    losartan (COZAAR) 100 MG tablet, Take 1 tablet (100 mg total) by mouth daily., Disp: 30 tablet, Rfl: 2   MAGNESIUM GLYCINATE PO, Take 200-400 mg by mouth at bedtime. 200 mg each, Disp: , Rfl:    nystatin (MYCOSTATIN/NYSTOP) powder, Apply 1 application topically daily as needed (fungal)., Disp: , Rfl:    OVER THE COUNTER MEDICATION, Take 1-2 drops by mouth daily as needed (Thydoid Function). Potassium Iodide 4 %  Iodine 2 % Distilled water, Disp: , Rfl:    potassium chloride (KLOR-CON) 10 MEQ tablet, Take 1 tablet (10 mEq total) by mouth 2 (two) times daily. (Patient taking differently: Take 20 mEq by mouth daily.), Disp: 180 tablet, Rfl: 1   valACYclovir (VALTREX) 500 MG tablet, Take 1 tablet (500 mg total) by mouth daily., Disp: 30 tablet, Rfl: 6   Vitamin D, Ergocalciferol, (DRISDOL) 1.25 MG (50000 UNIT) CAPS capsule, TAKE 1 CAPSULE EVERY 7 DAYS., Disp: 12 capsule, Rfl: 1   ezetimibe (ZETIA) 10 MG tablet, TAKE 1 TABLET ONCE DAILY. (Patient not taking: Reported on 08/30/2021), Disp: 30 tablet, Rfl: 0   Allergies  Allergen Reactions   Actos [Pioglitazone] Swelling   Augmentin [Amoxicillin-Pot Clavulanate] Nausea Only    Causes severe diarrhea   Invokana [Canagliflozin] Other (See Comments)    Yeast infection   Lopid [Gemfibrozil] Other (See Comments)    Burning tounge   Metoprolol Other (See Comments)    headaches   Naproxen Sodium Other (See Comments)    Patient states she felt loopy   Other Other (See Comments)   Statins Other (See Comments)    Joint pains Joint pains   Amlodipine Cough   Hctz [Hydrochlorothiazide] Rash   Lisinopril Cough   Morphine And Related Itching and Rash   Past Medical History:  Diagnosis Date   Allergy    Anginal pain (HCC)    Anxiety    Arthritis    Asthma    Bladder disorder, other    Borderline personality disorder ([place]    Carotid artery disease ([facility]   Depression    Diabetes mellitus without complication (HCC)    GERD (gastroesophageal reflux disease)    Headache    Hyperlipidemia    Hypertension    Kidney stones    Neuromuscular disorder (HCC)    Neuropathy   PTSD (post-traumatic stress disorder)    Shortness of breath  Sleep apnea    Stroke [facility]) 2018   Thyroid disease    nodule    Past Surgical History:  Procedure Laterality Date   CARPAL TUNNEL RELEASE     left 2015   CERVICAL FUSION Bilateral 2003   C 5-6 with titanium screws   CHOLECYSTECTOMY     COLONOSCOPY WITH PROPOFOL N/A 06/24/2014   Procedure: COLONOSCOPY WITH PROPOFOL;  Surgeon: Rogene Houston, MD;  Location: [facility];  Service: Endoscopy;  Laterality: N/A;  cecum time in 0810    time out   0821    total time 11 minutes   DILITATION & CURRETTAGE/HYSTROSCOPY WITH HYDROTHERMAL ABLATION N/A 04/19/2021   Procedure: DILATATION & CURETTAGE/HYSTEROSCOPY WITH HYDROTHERMAL ABLATION;  Surgeon: Janyth Pupa, DO;  Location: [facility];  Service: Gynecology;  Laterality: N/A;   INTRAUTERINE DEVICE (IUD) INSERTION N/A 04/19/2021   Procedure: INTRAUTERINE DEVICE (IUD) INSERTION;  Surgeon: Janyth Pupa, DO;  Location: [facility];  Service: Gynecology;  Laterality: N/A;   POLYPECTOMY N/A 06/24/2014   Procedure: RECTAL POLYPECTOMY;  Surgeon: Rogene Houston, MD;  Location: [facility];  Service: Endoscopy;  Laterality: N/A;   [place]   WISDOM TOOTH EXTRACTION Bilateral 2001 and 1990s   top and bottom     Social History   Socioeconomic History   Marital status: Married    Spouse name: [name]   Number of children: 0   Years of education: HS   Highest education level: Not on file  Occupational History   Occupation: Disabled  Tobacco Use   Smoking status: Never   Smokeless tobacco: Never  Vaping Use   Vaping Use: Never used  Substance and Sexual Activity   Alcohol use: No    Comment: Rarely   Drug use:  No   Sexual activity: Not Currently    Birth control/protection: I.U.D.  Other Topics Concern   Not on file  Social History Narrative   Lives at home with husband.   Right-handed.   3-4 cups caffeine per day.   Social Determinants of Health   Financial Resource Strain: Medium Risk   Difficulty of Paying Living Expenses: Somewhat hard  Food Insecurity: Food Insecurity Present   Worried About Running Out of Food in the Last Year: Sometimes true   Ran Out of Food in the Last Year: Never true  Transportation Needs: No Transportation Needs   Lack of Transportation (Medical): No   Lack of Transportation (Non-Medical): No  Physical Activity: Insufficiently Active   Days of Exercise per Week: 1 day   Minutes of Exercise per Session: 10 min  Stress: No Stress Concern Present   Feeling of Stress : Only a little  Social Connections: Moderately Isolated   Frequency of Communication with Friends and Family: Once a week   Frequency of Social Gatherings with Friends and Family: Once a week   Attends Religious Services: 1 to 4 times per year   Active Member of [employer] or Organizations: No   Attends Archivist Meetings: Never   Marital Status: Married  Human resources officer Violence: Not At Risk   Fear of Current or Ex-Partner: No   Emotionally Abused: No   Physically Abused: No   Sexually Abused: No        Objective:    BP (!) 177/94    Pulse 73    Temp (!) 97.4 F (36.3 C) (Temporal)    Ht 5' 6"  (1.676 m)    Wt Marland Kitchen)  156.3 kg    SpO2 99%    BMI 55.62 kg/m   Wt Readings from Last 3 Encounters:  08/30/21 (!) 344 lb 9.6 oz (156.3 kg)  06/16/21 (!) 341 lb 12.8 oz (155 kg)  06/13/21 (!) 339 lb (153.8 kg)    Physical Exam Vitals reviewed.  Constitutional:      General: She is not in acute distress.    Appearance: Normal appearance. She is morbidly obese. She is not ill-appearing, toxic-appearing or diaphoretic.  HENT:     Head: Normocephalic and atraumatic.  Eyes:     General:  No scleral icterus.       Right eye: No discharge.        Left eye: No discharge.     Conjunctiva/sclera: Conjunctivae normal.  Cardiovascular:     Rate and Rhythm: Normal rate and regular rhythm.     Heart sounds: Normal heart sounds. No murmur heard.   No friction rub. No gallop.  Pulmonary:     Effort: Pulmonary effort is normal. No respiratory distress.     Breath sounds: Normal breath sounds. No stridor. No wheezing, rhonchi or rales.  Musculoskeletal:        General: Normal range of motion.     Cervical back: Normal range of motion.  Skin:    General: Skin is warm and dry.     Capillary Refill: Capillary refill takes less than 2 seconds.     Comments: Erythematous yeast rash in groin folds (R>L).  Neurological:     General: No focal deficit present.     Mental Status: She is alert and oriented to person, place, and time. Mental status is at baseline.  Psychiatric:        Mood and Affect: Mood normal.        Behavior: Behavior normal.        Thought Content: Thought content normal.        Judgment: Judgment normal.    Lab Results  Component Value Date   TSH 2.320 12/18/2016   Lab Results  Component Value Date   WBC 7.2 04/18/2021   HGB 14.0 04/18/2021   HCT 43.2 04/18/2021   MCV 89.4 04/18/2021   PLT 375 04/18/2021   Lab Results  Component Value Date   NA 136 04/18/2021   K 4.3 04/18/2021   CO2 24 04/18/2021   GLUCOSE 137 (H) 04/18/2021   BUN 7 04/18/2021   CREATININE 0.94 04/18/2021   BILITOT 0.3 03/07/2021   ALKPHOS 95 03/07/2021   AST 12 03/07/2021   ALT 12 03/07/2021   PROT 7.1 03/07/2021   ALBUMIN 4.5 03/07/2021   CALCIUM 9.0 04/18/2021   ANIONGAP 9 04/18/2021   EGFR 72 03/07/2021   Lab Results  Component Value Date   CHOL 223 (H) 03/07/2021   Lab Results  Component Value Date   HDL 32 (L) 03/07/2021   Lab Results  Component Value Date   LDLCALC 170 (H) 03/07/2021   Lab Results  Component Value Date   TRIG 117 03/07/2021   Lab  Results  Component Value Date   CHOLHDL 7.0 (H) 03/07/2021   Lab Results  Component Value Date   HGBA1C 8.6 (H) 04/18/2021

## 2021-08-30 NOTE — Patient Instructions (Signed)
Please ask your eye doctor to send [place] a copy of your eye exam next month.  Please ask your endocrinologist to send [place] notes/lab work when you see them so that we have good continuity of care.

## 2021-08-31 ENCOUNTER — Encounter: Payer: Self-pay | Admitting: Family Medicine

## 2021-08-31 ENCOUNTER — Telehealth: Payer: Self-pay | Admitting: Family Medicine

## 2021-08-31 ENCOUNTER — Other Ambulatory Visit: Payer: Self-pay

## 2021-08-31 ENCOUNTER — Ambulatory Visit (HOSPITAL_COMMUNITY)
Admission: RE | Admit: 2021-08-31 | Discharge: 2021-08-31 | Disposition: A | Discharge status: Home / Self Care | Payer: BC Managed Care – PPO | Source: Ambulatory Visit | Attending: Family Medicine | Admitting: Family Medicine

## 2021-08-31 ENCOUNTER — Telehealth: Payer: BC Managed Care – PPO | Admitting: Family Medicine

## 2021-08-31 DIAGNOSIS — I10 Essential (primary) hypertension: Secondary | ICD-10-CM | POA: Diagnosis not present

## 2021-08-31 DIAGNOSIS — J341 Cyst and mucocele of nose and nasal sinus: Secondary | ICD-10-CM

## 2021-08-31 DIAGNOSIS — Z8673 Personal history of transient ischemic attack (TIA), and cerebral infarction without residual deficits: Secondary | ICD-10-CM | POA: Diagnosis not present

## 2021-08-31 DIAGNOSIS — H538 Other visual disturbances: Secondary | ICD-10-CM | POA: Diagnosis not present

## 2021-08-31 DIAGNOSIS — H547 Unspecified visual loss: Secondary | ICD-10-CM | POA: Diagnosis not present

## 2021-08-31 HISTORY — DX: Cyst and mucocele of nose and nasal sinus: J34.1

## 2021-08-31 LAB — CMP14+EGFR
ALT: 14 IU/L (ref 0–32)
AST: 11 IU/L (ref 0–40)
Albumin/Globulin Ratio: 1.8 (ref 1.2–2.2)
Albumin: 4.4 g/dL (ref 3.8–4.8)
Alkaline Phosphatase: 120 IU/L (ref 44–121)
BUN/Creatinine Ratio: 9 (ref 9–23)
BUN: 8 mg/dL (ref 6–24)
Bilirubin Total: 0.2 mg/dL (ref 0.0–1.2)
CO2: 25 mmol/L (ref 20–29)
Calcium: 9.6 mg/dL (ref 8.7–10.2)
Chloride: 103 mmol/L (ref 96–106)
Creatinine, Ser: 0.86 mg/dL (ref 0.57–1.00)
Globulin, Total: 2.5 g/dL (ref 1.5–4.5)
Glucose: 125 mg/dL — ABNORMAL HIGH (ref 70–99)
Potassium: 4.5 mmol/L (ref 3.5–5.2)
Sodium: 143 mmol/L (ref 134–144)
Total Protein: 6.9 g/dL (ref 6.0–8.5)
eGFR: 83 mL/min/{1.73_m2} (ref >59)

## 2021-08-31 LAB — CBC WITH DIFFERENTIAL/PLATELET
Basophils Absolute: 0 10*3/uL (ref 0.0–0.2)
Basos: 1 %
EOS (ABSOLUTE): 0.1 10*3/uL (ref 0.0–0.4)
Eos: 1 %
Hematocrit: 43.7 % (ref 34.0–46.6)
Hemoglobin: 14.4 g/dL (ref 11.1–15.9)
Immature Grans (Abs): 0 10*3/uL (ref 0.0–0.1)
Immature Granulocytes: 1 %
Lymphocytes Absolute: 3.5 10*3/uL — ABNORMAL HIGH (ref 0.7–3.1)
Lymphs: 44 %
MCH: 28.5 pg (ref 26.6–33.0)
MCHC: 33 g/dL (ref 31.5–35.7)
MCV: 87 fL (ref 79–97)
Monocytes Absolute: 0.5 10*3/uL (ref 0.1–0.9)
Monocytes: 6 %
Neutrophils Absolute: 3.8 10*3/uL (ref 1.4–7.0)
Neutrophils: 47 %
Platelets: 343 10*3/uL (ref 150–450)
RBC: 5.05 x10E6/uL (ref 3.77–5.28)
RDW: 13.1 % (ref 11.7–15.4)
WBC: 7.9 10*3/uL (ref 3.4–10.8)

## 2021-08-31 LAB — LIPID PANEL
Chol/HDL Ratio: 4.5 ratio — ABNORMAL HIGH (ref 0.0–4.4)
Cholesterol, Total: 218 mg/dL — ABNORMAL HIGH (ref 100–199)
HDL: 48 mg/dL (ref >39)
LDL Chol Calc (NIH): 151 mg/dL — ABNORMAL HIGH (ref 0–99)
Triglycerides: 106 mg/dL (ref 0–149)
VLDL Cholesterol Cal: 19 mg/dL (ref 5–40)

## 2021-08-31 LAB — HM DIABETES EYE EXAM

## 2021-08-31 LAB — VITAMIN D 25 HYDROXY (VIT D DEFICIENCY, FRACTURES): Vit D, 25-Hydroxy: 26.7 ng/mL — ABNORMAL LOW (ref 30.0–100.0)

## 2021-08-31 NOTE — Telephone Encounter (Signed)
Please call patient with CT appt ASAP.

## 2021-08-31 NOTE — Telephone Encounter (Signed)
Pt has called office a few times this morning asking for update on info Britney said she would call her about regarding an eye appt and her insurance.  Please advise and call patient with info.

## 2021-08-31 NOTE — Telephone Encounter (Signed)
I spoke to [facility]. Even if they don't accept her insurance, the price is only $60. They can see her today but she needs to come before 3 PM.

## 2021-08-31 NOTE — Telephone Encounter (Signed)
Pt aware of provider feedback and voiced understanding and will go today before 3.

## 2021-09-12 ENCOUNTER — Other Ambulatory Visit: Payer: Self-pay | Admitting: Family Medicine

## 2021-09-12 DIAGNOSIS — I679 Cerebrovascular disease, unspecified: Secondary | ICD-10-CM

## 2021-09-26 DIAGNOSIS — E083293 Diabetes mellitus due to underlying condition with mild nonproliferative diabetic retinopathy without macular edema, bilateral: Secondary | ICD-10-CM | POA: Diagnosis not present

## 2021-09-26 DIAGNOSIS — H40053 Ocular hypertension, bilateral: Secondary | ICD-10-CM | POA: Diagnosis not present

## 2021-09-26 DIAGNOSIS — Z7984 Long term (current) use of oral hypoglycemic drugs: Secondary | ICD-10-CM | POA: Diagnosis not present

## 2021-09-26 LAB — HM DIABETES EYE EXAM

## 2021-10-10 DIAGNOSIS — F329 Major depressive disorder, single episode, unspecified: Secondary | ICD-10-CM | POA: Diagnosis not present

## 2021-10-17 DIAGNOSIS — E119 Type 2 diabetes mellitus without complications: Secondary | ICD-10-CM | POA: Diagnosis not present

## 2021-10-18 ENCOUNTER — Encounter: Payer: Self-pay | Admitting: Family Medicine

## 2021-10-18 ENCOUNTER — Ambulatory Visit (INDEPENDENT_AMBULATORY_CARE_PROVIDER_SITE_OTHER): Payer: BC Managed Care – PPO | Admitting: Family Medicine

## 2021-10-18 VITALS — BP 162/83 | HR 79 | Temp 97.3°F | Ht 66.0 in | Wt 349.6 lb

## 2021-10-18 DIAGNOSIS — Z6841 Body Mass Index (BMI) 40.0 and over, adult: Secondary | ICD-10-CM

## 2021-10-18 DIAGNOSIS — E559 Vitamin D deficiency, unspecified: Secondary | ICD-10-CM

## 2021-10-18 DIAGNOSIS — E7800 Pure hypercholesterolemia, unspecified: Secondary | ICD-10-CM | POA: Diagnosis not present

## 2021-10-18 DIAGNOSIS — I10 Essential (primary) hypertension: Secondary | ICD-10-CM

## 2021-10-18 DIAGNOSIS — I679 Cerebrovascular disease, unspecified: Secondary | ICD-10-CM | POA: Diagnosis not present

## 2021-10-18 DIAGNOSIS — R601 Generalized edema: Secondary | ICD-10-CM

## 2021-10-18 DIAGNOSIS — H538 Other visual disturbances: Secondary | ICD-10-CM

## 2021-10-18 DIAGNOSIS — Z0000 Encounter for general adult medical examination without abnormal findings: Secondary | ICD-10-CM

## 2021-10-18 MED ORDER — VALSARTAN 320 MG PO TABS: 320.0000 mg | ORAL_TABLET | Freq: Every day | ORAL | 2 refills | Status: DC

## 2021-10-18 MED ORDER — FUROSEMIDE 40 MG PO TABS: 40.0000 mg | ORAL_TABLET | Freq: Every day | ORAL | 2 refills | Status: DC

## 2021-10-18 NOTE — Progress Notes (Signed)
Assessment & Plan:  1. Essential hypertension D/C Losartan. Start Valsartan. Increase furosemide to 40 mg daily. - furosemide (LASIX) 40 MG tablet; Take 1 tablet (40 mg total) by mouth daily.  Dispense: 30 tablet; Refill: 2 - valsartan (DIOVAN) 320 MG tablet; Take 1 tablet (320 mg total) by mouth daily.  Dispense: 30 tablet; Refill: 2  2. High cholesterol Uncontrolled. Unable to tolerate statins or zetia. Unagreeable in using a PCSK9 inhibitor.   3. Small vessel disease, cerebrovascular Unable to tolerate statins or zetia. Unagreeable in using a PCSK9 inhibitor.   4. Vitamin D deficiency Continue weekly supplement.  5. Generalized edema Uncontrolled. Increase furosemide from 20 mg to 40 mg daily. Encouraged daily weights and for her to keep a log to bring to her next appointment. - furosemide (LASIX) 40 MG tablet; Take 1 tablet (40 mg total) by mouth daily.  Dispense: 30 tablet; Refill: 2  6. Morbid obesity with BMI of 50.0-59.9, adult ([place]) Encouraged healthy eating and exercise.   7. Flashing lights seen - Ambulatory referral to Neurology  8. Health maintenance Encouraged to schedule mammogram and colonoscopy.    Return in about 4 weeks (around 11/15/2021) for HTN & edema.  Hendricks Limes, MSN, APRN, FNP-C [facility]  Subjective:    Patient ID: Kathryn Jimenez, female    DOB: 11/12/1970, 51 y.o.   MRN: [id number]  Patient Care Team: Loman Brooklyn, FNP as PCP - General (Family Medicine) Jacelyn Pi, MD as Consulting Physician (Endocrinology) Rogene Houston, MD as Consulting Physician (Gastroenterology) Sandford Craze, MD as Consulting Physician (Dermatology) Case, Reche Dixon, MD as Consulting Physician (Orthopedic Surgery)   Chief Complaint:  Chief Complaint  Patient presents with   Medical Management of Chronic Issues    HPI: Kathryn Jimenez is a 51 y.o. female presenting on 10/18/2021 for Medical Management of Chronic  Issues  Hypertension At patient's last visit she was not agreeable to making any further medication changes for her blood pressure.  She was considering the hypertension clinic, but states today she does not wish to go.    Small vessel disease/cerebrovascular: taking Plavix. Unable to tolerate statins or Zetia.   Hyperlipidemia Unable to tolerate statins or Zetia.  Patient previously declined Repatha.  The 10-year ASCVD risk score (Arnett DK, et al., 2019) is: 5.9%   Values used to calculate the score:     Age: 89 years     Sex: Female     Is Non-Hispanic African American: No     Diabetic: Yes     Tobacco smoker: No     Systolic Blood Pressure: 552 mmHg     Is BP treated: Yes     HDL Cholesterol: 48 mg/dL     Total Cholesterol: 218 mg/dL  Vitamin D Deficiency: taking the weekly vitamin D supplement. Last level remained low in January 2023. She is unable to afford OTC supplements.   New complaints:  Vision: Patient previously reported her eyes "feel like they vibrate" and it radiates into the tissue around her eyes. She also reported bright strobe lights flashing in one or both eyes.  Head CT on 08/31/2021 did not show any acute intracranial abnormalities.  She has seen to optometrist to be evaluated for the symptoms and states they both told her they could not find anything wrong with her eyes.  The second recommended a referral to neurology, which is what she is requesting today.  Patient continues to gain weight and feels like their is fluid  in abdominal panus and on her right side, including her leg and buttock.  She reports she can feel a knot on her right thigh.   Social history:  Relevant past medical, surgical, family and social history reviewed and updated as indicated. Interim medical history since our last visit reviewed.  Allergies and medications reviewed and updated.  DATA REVIEWED: CHART IN EPIC  ROS: Negative unless specifically indicated above in HPI.     Current Outpatient Medications:    ACCU-CHEK AVIVA PLUS test strip, SMARTSIG:Via Meter, Disp: , Rfl:    acetaminophen (TYLENOL) 500 MG tablet, Take 500 mg by mouth every 6 (six) hours as needed for mild pain or moderate pain., Disp: , Rfl:    albuterol (VENTOLIN HFA) 108 (90 Base) MCG/ACT inhaler, 2 PUFFS INTO THE LUNGS EVERY 6 HOURS AS NEEDED FOR WHEEZING OR SHORTNESS OF BREATH., Disp: 6.7 g, Rfl: 0   buPROPion (WELLBUTRIN XL) 150 MG 24 hr tablet, Take 150 mg by mouth daily., Disp: , Rfl:    clobetasol cream (TEMOVATE) 3.08 %, Apply 1 application topically 2 (two) times daily as needed (scalp psoriasis)., Disp: 30 g, Rfl: 0   clopidogrel (PLAVIX) 75 MG tablet, Take 1 tablet (75 mg total) by mouth daily., Disp: 90 tablet, Rfl: 1   cycloSPORINE (RESTASIS) 0.05 % ophthalmic emulsion, Apply 1 drop to eye 2 (two) times daily., Disp: , Rfl:    diphenhydrAMINE (BENADRYL) 25 mg capsule, Take 25 mg by mouth every 6 (six) hours as needed for allergies., Disp: , Rfl:    docusate sodium (COLACE) 100 MG capsule, Take 2 capsules (200 mg total) by mouth at bedtime. (Patient taking differently: Take 200 mg by mouth as needed for moderate constipation or mild constipation.), Disp: , Rfl: 0   FLUoxetine (PROZAC) 40 MG capsule, Take 40 mg by mouth daily., Disp: , Rfl:    furosemide (LASIX) 20 MG tablet, Take 1 tablet (20 mg total) by mouth daily., Disp: 90 tablet, Rfl: 1   Incontinence Supply Disposable (PREVAIL IB FULL MAT BRIEF 2XL) MISC, 1 each by Does not apply route every 2 (two) hours as needed., Disp: 96 each, Rfl: 5   insulin aspart (NOVOLOG) 100 UNIT/ML injection, Inject into the skin 3 (three) times daily before meals. Per pump, Disp: , Rfl:    levonorgestrel (LILETTA, 52 MG,) 20.1 MCG/DAY IUD, 1 each by Intrauterine route once. Inserted 04/19/21, Disp: , Rfl:    LORazepam (ATIVAN) 0.5 MG tablet, Take 0.25-0.5 mg by mouth daily as needed for anxiety. Takes 0.68m - 0.561m 1-2 time daily as needed.,  Disp: , Rfl:    losartan (COZAAR) 100 MG tablet, Take 1 tablet (100 mg total) by mouth daily., Disp: 90 tablet, Rfl: 1   MAGNESIUM GLYCINATE PO, Take 200-400 mg by mouth at bedtime. 200 mg each, Disp: , Rfl:    nystatin (MYCOSTATIN/NYSTOP) powder, Apply 1 application topically daily as needed (fungal)., Disp: , Rfl:    OVER THE COUNTER MEDICATION, Take 1-2 drops by mouth daily as needed (Thydoid Function). Potassium Iodide 4 %  Iodine 2 % Distilled water, Disp: , Rfl:    potassium chloride (KLOR-CON) 10 MEQ tablet, Take 2 tablets (20 mEq total) by mouth daily., Disp: 180 tablet, Rfl: 1   Vitamin D, Ergocalciferol, (DRISDOL) 1.25 MG (50000 UNIT) CAPS capsule, TAKE 1 CAPSULE EVERY 7 DAYS., Disp: 12 capsule, Rfl: 1   ezetimibe (ZETIA) 10 MG tablet, TAKE 1 TABLET ONCE DAILY. (Patient not taking: Reported on 10/18/2021), Disp: 30 tablet, Rfl: 0  Allergies  Allergen Reactions   Actos [Pioglitazone] Swelling   Augmentin [Amoxicillin-Pot Clavulanate] Nausea Only    Causes severe diarrhea   Invokana [Canagliflozin] Other (See Comments)    Yeast infection   Lopid [Gemfibrozil] Other (See Comments)    Burning tounge   Metoprolol Other (See Comments)    headaches   Naproxen Sodium Other (See Comments)    Patient states she felt loopy   Other Other (See Comments)   Statins Other (See Comments)    Joint pains Joint pains   Amlodipine Cough   Hctz [Hydrochlorothiazide] Rash   Lisinopril Cough   Morphine And Related Itching and Rash   Past Medical History:  Diagnosis Date   Allergy    Anginal pain (HCC)    Anxiety    Arthritis    Asthma    Bladder disorder, other    Borderline personality disorder ([place])    Carotid artery disease ([place]) 03/05/2017   Depression    Diabetes mellitus without complication (HCC)    GERD (gastroesophageal reflux disease)    Headache    Hyperlipidemia    Hypertension    Kidney stones    Mucous retention cyst of maxillary sinus 08/31/2021   Noted on head CT    Neuromuscular disorder (HCC)    Neuropathy   PTSD (post-traumatic stress disorder)    Shortness of breath    Sleep apnea    Stroke ([place]) 2018   Thyroid disease    nodule    Past Surgical History:  Procedure Laterality Date   CARPAL TUNNEL RELEASE     left 2015   CERVICAL FUSION Bilateral 2003   C 5-6 with titanium screws   CHOLECYSTECTOMY     COLONOSCOPY WITH PROPOFOL N/A 06/24/2014   Procedure: COLONOSCOPY WITH PROPOFOL;  Surgeon: Rogene Houston, MD;  Location: [facility];  Service: Endoscopy;  Laterality: N/A;  cecum time in 0810    time out   0821    total time 11 minutes   DILITATION & CURRETTAGE/HYSTROSCOPY WITH HYDROTHERMAL ABLATION N/A 04/19/2021   Procedure: DILATATION & CURETTAGE/HYSTEROSCOPY WITH HYDROTHERMAL ABLATION;  Surgeon: Janyth Pupa, DO;  Location: [facility];  Service: Gynecology;  Laterality: N/A;   INTRAUTERINE DEVICE (IUD) INSERTION N/A 04/19/2021   Procedure: INTRAUTERINE DEVICE (IUD) INSERTION;  Surgeon: Janyth Pupa, DO;  Location: [facility];  Service: Gynecology;  Laterality: N/A;   POLYPECTOMY N/A 06/24/2014   Procedure: RECTAL POLYPECTOMY;  Surgeon: Rogene Houston, MD;  Location: [facility];  Service: Endoscopy;  Laterality: N/A;   [place]   WISDOM TOOTH EXTRACTION Bilateral 2001 and 1990s   top and bottom     Social History   Socioeconomic History   Marital status: Married    Spouse name: [name]   Number of children: 0   Years of education: HS   Highest education level: Not on file  Occupational History   Occupation: Disabled  Tobacco Use   Smoking status: Never   Smokeless tobacco: Never  Vaping Use   Vaping Use: Never used  Substance and Sexual Activity   Alcohol use: No    Comment: Rarely   Drug use: No   Sexual activity: Not Currently    Birth control/protection: I.U.D.  Other Topics Concern   Not on file  Social History Narrative   Lives at home with husband.   Right-handed.   3-4 cups caffeine per day.    Social Determinants of Radio broadcast assistant  Strain: Medium Risk   Difficulty of Paying Living Expenses: Somewhat hard  Food Insecurity: Food Insecurity Present   Worried About Charity fundraiser in the Last Year: Sometimes true   Ran Out of Food in the Last Year: Never true  Transportation Needs: No Transportation Needs   Lack of Transportation (Medical): No   Lack of Transportation (Non-Medical): No  Physical Activity: Insufficiently Active   Days of Exercise per Week: 1 day   Minutes of Exercise per Session: 10 min  Stress: No Stress Concern Present   Feeling of Stress : Only a little  Social Connections: Moderately Isolated   Frequency of Communication with Friends and Family: Once a week   Frequency of Social Gatherings with Friends and Family: Once a week   Attends Religious Services: 1 to 4 times per year   Active Member of [employer] or Organizations: No   Attends Archivist Meetings: Never   Marital Status: Married  Human resources officer Violence: Not At Risk   Fear of Current or Ex-Partner: No   Emotionally Abused: No   Physically Abused: No   Sexually Abused: No        Objective:    BP (!) 162/83    Pulse 79    Temp (!) 97.3 F (36.3 C) (Temporal)    Ht 5' 6"  (1.676 m)    Wt (!) 349 lb 9.6 oz (158.6 kg)    SpO2 98%    BMI 56.43 kg/m   Wt Readings from Last 3 Encounters:  10/18/21 (!) 349 lb 9.6 oz (158.6 kg)  08/30/21 (!) 344 lb 9.6 oz (156.3 kg)  06/16/21 (!) 341 lb 12.8 oz (155 kg)    Physical Exam Vitals reviewed.  Constitutional:      General: She is not in acute distress.    Appearance: Normal appearance. She is morbidly obese. She is not ill-appearing, toxic-appearing or diaphoretic.  HENT:     Head: Normocephalic and atraumatic.  Eyes:     General: No scleral icterus.       Right eye: No discharge.        Left eye: No discharge.     Conjunctiva/sclera: Conjunctivae normal.  Cardiovascular:     Rate and Rhythm: Normal rate and regular  rhythm.     Heart sounds: Normal heart sounds. No murmur heard.   No friction rub. No gallop.  Pulmonary:     Effort: Pulmonary effort is normal. No respiratory distress.     Breath sounds: Normal breath sounds. No stridor. No wheezing, rhonchi or rales.  Musculoskeletal:        General: Normal range of motion.     Cervical back: Normal range of motion.  Skin:    General: Skin is warm and dry.     Capillary Refill: Capillary refill takes less than 2 seconds.     Comments: Knot on outer right thigh. Increased edema on left side of abdominal panus, left buttock, and left thigh.  Neurological:     General: No focal deficit present.     Mental Status: She is alert and oriented to person, place, and time. Mental status is at baseline.  Psychiatric:        Mood and Affect: Mood normal.        Behavior: Behavior normal.        Thought Content: Thought content normal.        Judgment: Judgment normal.    Lab Results  Component Value Date   TSH  2.320 12/18/2016   Lab Results  Component Value Date   WBC 7.9 08/30/2021   HGB 14.4 08/30/2021   HCT 43.7 08/30/2021   MCV 87 08/30/2021   PLT 343 08/30/2021   Lab Results  Component Value Date   NA 143 08/30/2021   K 4.5 08/30/2021   CO2 25 08/30/2021   GLUCOSE 125 (H) 08/30/2021   BUN 8 08/30/2021   CREATININE 0.86 08/30/2021   BILITOT 0.2 08/30/2021   ALKPHOS 120 08/30/2021   AST 11 08/30/2021   ALT 14 08/30/2021   PROT 6.9 08/30/2021   ALBUMIN 4.4 08/30/2021   CALCIUM 9.6 08/30/2021   ANIONGAP 9 04/18/2021   EGFR 83 08/30/2021   Lab Results  Component Value Date   CHOL 218 (H) 08/30/2021   Lab Results  Component Value Date   HDL 48 08/30/2021   Lab Results  Component Value Date   LDLCALC 151 (H) 08/30/2021   Lab Results  Component Value Date   TRIG 106 08/30/2021   Lab Results  Component Value Date   CHOLHDL 4.5 (H) 08/30/2021   Lab Results  Component Value Date   HGBA1C 8.6 (H) 04/18/2021

## 2021-10-18 NOTE — Patient Instructions (Addendum)
Stop taking Losartan and start taking Valsartan for your blood pressure. ? ?Increase furosemide (Lasix) to 40 mg daily. Weigh yourself daily and keep a log to bring back to your next appointment. ? ?Schedule your mammogram and colonoscopy.  ?

## 2021-10-22 ENCOUNTER — Encounter: Payer: Self-pay | Admitting: Family Medicine

## 2021-10-30 NOTE — Telephone Encounter (Signed)
Patient got a call from [facility] and she don't want to see the doctor there that thye called her on. She told Britney she didn't want to see Dr. Krista Blue. Wants to go to [facility] and see whoever she can see. ?

## 2021-11-01 NOTE — Telephone Encounter (Signed)
Please call patient and make her aware of Courtney's response. ?

## 2021-11-16 ENCOUNTER — Encounter: Payer: Self-pay | Admitting: Family Medicine

## 2021-11-16 ENCOUNTER — Ambulatory Visit (INDEPENDENT_AMBULATORY_CARE_PROVIDER_SITE_OTHER): Payer: BC Managed Care – PPO | Admitting: Family Medicine

## 2021-11-16 VITALS — BP 153/76 | HR 71 | Temp 97.5°F | Ht 66.0 in | Wt 349.6 lb

## 2021-11-16 DIAGNOSIS — Z1159 Encounter for screening for other viral diseases: Secondary | ICD-10-CM | POA: Diagnosis not present

## 2021-11-16 DIAGNOSIS — R601 Generalized edema: Secondary | ICD-10-CM

## 2021-11-16 DIAGNOSIS — G479 Sleep disorder, unspecified: Secondary | ICD-10-CM

## 2021-11-16 DIAGNOSIS — E559 Vitamin D deficiency, unspecified: Secondary | ICD-10-CM | POA: Diagnosis not present

## 2021-11-16 DIAGNOSIS — I10 Essential (primary) hypertension: Secondary | ICD-10-CM | POA: Diagnosis not present

## 2021-11-16 DIAGNOSIS — E1142 Type 2 diabetes mellitus with diabetic polyneuropathy: Secondary | ICD-10-CM

## 2021-11-16 DIAGNOSIS — Z794 Long term (current) use of insulin: Secondary | ICD-10-CM

## 2021-11-16 DIAGNOSIS — F4312 Post-traumatic stress disorder, chronic: Secondary | ICD-10-CM

## 2021-11-16 DIAGNOSIS — G4730 Sleep apnea, unspecified: Secondary | ICD-10-CM

## 2021-11-16 DIAGNOSIS — E7800 Pure hypercholesterolemia, unspecified: Secondary | ICD-10-CM

## 2021-11-16 DIAGNOSIS — F515 Nightmare disorder: Secondary | ICD-10-CM | POA: Diagnosis not present

## 2021-11-16 DIAGNOSIS — Z9641 Presence of insulin pump (external) (internal): Secondary | ICD-10-CM

## 2021-11-16 MED ORDER — PRAZOSIN HCL 1 MG PO CAPS
1.0000 mg | ORAL_CAPSULE | Freq: Every day | ORAL | 2 refills | Status: DC
Start: 2021-11-16 — End: 2021-12-19

## 2021-11-16 NOTE — Progress Notes (Signed)
? ?Assessment & Plan:  ?1. Generalized edema ?Improving. May increase further after labs result if they look okay. ?- CMP14+EGFR ?- Magnesium ? ?2. Essential hypertension ?Improving. Continue Valsartan. Increasing furosemide and starting Prazosin may also help reduce blood pressure. ?- CBC with Differential/Platelet ?- CMP14+EGFR ?- Lipid panel ? ?3. Difficulty sleeping ?Discussed likely due to a combination of nightmares and untreated sleep apnea. If we can control nightmares, she may be able to tolerate her CPAP. ? ?4. Nightmares associated with chronic post-traumatic stress disorder ?Started Prazosin. I did call her psychiatrist, Dr. Hoyle Barr, and left a message letting him know I started this. ?- prazosin (MINIPRESS) 1 MG capsule; Take 1 capsule (1 mg total) by mouth at bedtime.  Dispense: 30 capsule; Refill: 2 ? ?5. Sleep apnea in adult ?Plan to treat nightmares to see if she can then tolerate her CPAP. ? ?6. High cholesterol ?Labs to assess. ?- CMP14+EGFR ?- Lipid panel ? ?7. Vitamin D deficiency ?Labs to assess. ?- VITAMIN D 25 Hydroxy (Vit-D Deficiency, Fractures) ? ?8. Encounter for hepatitis C screening test for low risk patient ?- Hepatitis C antibody (reflex, frozen specimen) ? ?9-10. Type 2 diabetes mellitus with diabetic polyneuropathy, with long-term current use of insulin (HCC)/Presence of insulin pump (external) (internal) ?Patient is in need of a continuous glucose meter to help monitor her diabetes. She is on an insulin pump, which she reports is going to be switched to a different one in the near future. She states her endocrinologist, Dr. Chalmers Cater, has tried to get her one but has not been able. I have placed a call to Dr. Almetta Lovely office inquiring if she would rather our mutual patient have the [facility] or [place] system. I will place the order once I get a call back.  ? ? ?Return in about 4 weeks (around 12/14/2021) for sleep & HTN. ? ?Hendricks Limes, MSN, APRN, FNP-C ?[place] ? ?Subjective:  ? ? Patient ID: Kathryn Jimenez, female    DOB: May 23, 1971, 51 y.o.   MRN: [id number] ? ?Patient Care Team: ?Loman Brooklyn, FNP as PCP - General (Family Medicine) ?Jacelyn Pi, MD as Consulting Physician (Endocrinology) ?Rogene Houston, MD as Consulting Physician (Gastroenterology) ?Sandford Craze, MD as Consulting Physician (Dermatology) ?Case, Reche Dixon, MD as Consulting Physician (Orthopedic Surgery)  ? ?Chief Complaint:  ?Chief Complaint  ?Patient presents with  ? Hypertension  ? Edema  ?  4 week follow up- states is the same  ?  ? ? ?HPI: ?Kathryn Jimenez is a 51 y.o. female presenting on 11/16/2021 for Hypertension and Edema (4 week follow up- states is the same /) ? ?Edema: patient reports she is urinating out much more fluid, but still feels like she is holding on to too much.  ? ?Hypertension: at our last visit Losartan was changed to Valsartan and furosemide was increased to 40 mg daily.  ? ?New complaints: ?Patient reports she is still not sleeping. This has been ongoing since childhood. She also has a diagnosis of sleep apnea but does not tolerate her CPAP. States she moves around so much at night due to her nightmares, that she knocks the mask off. She reports she has tried so many medications to help with sleep she cannot name them all, but she does name Trazodone, Ambien, and Elavil.  ? ? ?Social history: ? ?Relevant past medical, surgical, family and social history reviewed and updated as indicated. Interim medical history since our last visit reviewed. ? ?Allergies and medications reviewed and  updated. ? ?DATA REVIEWED: CHART IN EPIC ? ?ROS: Negative unless specifically indicated above in HPI.  ? ? ?Current Outpatient Medications:  ?  ACCU-CHEK AVIVA PLUS test strip, SMARTSIG:Via Meter, Disp: , Rfl:  ?  acetaminophen (TYLENOL) 500 MG tablet, Take 500 mg by mouth every 6 (six) hours as needed for mild pain or moderate pain., Disp: , Rfl:  ?  albuterol (VENTOLIN HFA) 108  (90 Base) MCG/ACT inhaler, 2 PUFFS INTO THE LUNGS EVERY 6 HOURS AS NEEDED FOR WHEEZING OR SHORTNESS OF BREATH., Disp: 6.7 g, Rfl: 0 ?  buPROPion (WELLBUTRIN XL) 150 MG 24 hr tablet, Take 150 mg by mouth daily., Disp: , Rfl:  ?  clobetasol cream (TEMOVATE) 8.92 %, Apply 1 application topically 2 (two) times daily as needed (scalp psoriasis)., Disp: 30 g, Rfl: 0 ?  clopidogrel (PLAVIX) 75 MG tablet, Take 1 tablet (75 mg total) by mouth daily., Disp: 90 tablet, Rfl: 1 ?  cycloSPORINE (RESTASIS) 0.05 % ophthalmic emulsion, Apply 1 drop to eye 2 (two) times daily., Disp: , Rfl:  ?  diphenhydrAMINE (BENADRYL) 25 mg capsule, Take 25 mg by mouth every 6 (six) hours as needed for allergies., Disp: , Rfl:  ?  docusate sodium (COLACE) 100 MG capsule, Take 2 capsules (200 mg total) by mouth at bedtime. (Patient taking differently: Take 200 mg by mouth as needed for moderate constipation or mild constipation.), Disp: , Rfl: 0 ?  FLUoxetine (PROZAC) 40 MG capsule, Take 40 mg by mouth daily., Disp: , Rfl:  ?  furosemide (LASIX) 40 MG tablet, Take 1 tablet (40 mg total) by mouth daily., Disp: 30 tablet, Rfl: 2 ?  Incontinence Supply Disposable (PREVAIL IB FULL MAT BRIEF 2XL) MISC, 1 each by Does not apply route every 2 (two) hours as needed., Disp: 96 each, Rfl: 5 ?  insulin aspart (NOVOLOG) 100 UNIT/ML injection, Inject into the skin 3 (three) times daily before meals. Per pump, Disp: , Rfl:  ?  levonorgestrel (LILETTA, 52 MG,) 20.1 MCG/DAY IUD, 1 each by Intrauterine route once. Inserted 04/19/21, Disp: , Rfl:  ?  LORazepam (ATIVAN) 0.5 MG tablet, Take 0.25-0.5 mg by mouth daily as needed for anxiety. Takes 0.51m - 0.533m 1-2 time daily as needed., Disp: , Rfl:  ?  MAGNESIUM GLYCINATE PO, Take 200-400 mg by mouth at bedtime. 200 mg each, Disp: , Rfl:  ?  nystatin (MYCOSTATIN/NYSTOP) powder, Apply 1 application topically daily as needed (fungal)., Disp: , Rfl:  ?  OVER THE COUNTER MEDICATION, Take 1-2 drops by mouth daily as  needed (Thydoid Function). Potassium Iodide 4 %  Iodine 2 % Distilled water, Disp: , Rfl:  ?  potassium chloride (KLOR-CON) 10 MEQ tablet, Take 2 tablets (20 mEq total) by mouth daily., Disp: 180 tablet, Rfl: 1 ?  valsartan (DIOVAN) 320 MG tablet, Take 1 tablet (320 mg total) by mouth daily., Disp: 30 tablet, Rfl: 2 ?  Vitamin D, Ergocalciferol, (DRISDOL) 1.25 MG (50000 UNIT) CAPS capsule, TAKE 1 CAPSULE EVERY 7 DAYS., Disp: 12 capsule, Rfl: 1 ?  ezetimibe (ZETIA) 10 MG tablet, TAKE 1 TABLET ONCE DAILY. (Patient not taking: Reported on 10/18/2021), Disp: 30 tablet, Rfl: 0  ? ?Allergies  ?Allergen Reactions  ? Actos [Pioglitazone] Swelling  ? Augmentin [Amoxicillin-Pot Clavulanate] Nausea Only  ?  Causes severe diarrhea  ? Invokana [Canagliflozin] Other (See Comments)  ?  Yeast infection  ? Lopid [Gemfibrozil] Other (See Comments)  ?  Burning tounge  ? Metoprolol Other (See Comments)  ?  headaches  ? Naproxen Sodium Other (See Comments)  ?  Patient states she felt loopy  ? Other Other (See Comments)  ? Statins Other (See Comments)  ?  Joint pains ?Joint pains  ? Amlodipine Cough  ? Hctz [Hydrochlorothiazide] Rash  ? Lisinopril Cough  ? Morphine And Related Itching and Rash  ? ?Past Medical History:  ?Diagnosis Date  ? Allergy   ? Anginal pain ([place])   ? Anxiety   ? Arthritis   ? Asthma   ? Bladder disorder, other   ? Borderline personality disorder ([place])   ? Carotid artery disease ([place]) 03/05/2017  ? Depression   ? Diabetes mellitus without complication ([place])   ? GERD (gastroesophageal reflux disease)   ? Headache   ? Hyperlipidemia   ? Hypertension   ? Kidney stones   ? Mucous retention cyst of maxillary sinus 08/31/2021  ? Noted on head CT  ? Neuromuscular disorder ([place])   ? Neuropathy  ? PTSD (post-traumatic stress disorder)   ? Shortness of breath   ? Sleep apnea   ? Stroke [facility]) 2018  ? Thyroid disease   ? nodule  ?  ?Past Surgical History:  ?Procedure Laterality Date  ? CARPAL TUNNEL RELEASE    ? left 2015  ?  CERVICAL FUSION Bilateral 2003  ? C 5-6 with titanium screws  ? CHOLECYSTECTOMY    ? COLONOSCOPY WITH PROPOFOL N/A 06/24/2014  ? Procedure: COLONOSCOPY WITH PROPOFOL;  Surgeon: Rogene Houston, MD;  Location: [facility];  Lewanda Rife

## 2021-11-17 DIAGNOSIS — Z7984 Long term (current) use of oral hypoglycemic drugs: Secondary | ICD-10-CM | POA: Diagnosis not present

## 2021-11-17 LAB — HCV INTERPRETATION

## 2021-11-17 LAB — CBC WITH DIFFERENTIAL/PLATELET
Basophils Absolute: 0.1 10*3/uL (ref 0.0–0.2)
Basos: 1 %
EOS (ABSOLUTE): 0.1 10*3/uL (ref 0.0–0.4)
Eos: 1 %
Hematocrit: 41.5 % (ref 34.0–46.6)
Hemoglobin: 13.7 g/dL (ref 11.1–15.9)
Immature Grans (Abs): 0 10*3/uL (ref 0.0–0.1)
Immature Granulocytes: 0 %
Lymphocytes Absolute: 3.4 10*3/uL — ABNORMAL HIGH (ref 0.7–3.1)
Lymphs: 46 %
MCH: 28.6 pg (ref 26.6–33.0)
MCHC: 33 g/dL (ref 31.5–35.7)
MCV: 87 fL (ref 79–97)
Monocytes Absolute: 0.5 10*3/uL (ref 0.1–0.9)
Monocytes: 7 %
Neutrophils Absolute: 3.3 10*3/uL (ref 1.4–7.0)
Neutrophils: 45 %
Platelets: 349 10*3/uL (ref 150–450)
RBC: 4.79 x10E6/uL (ref 3.77–5.28)
RDW: 13.4 % (ref 11.7–15.4)
WBC: 7.3 10*3/uL (ref 3.4–10.8)

## 2021-11-17 LAB — LIPID PANEL
Chol/HDL Ratio: 4.8 ratio — ABNORMAL HIGH (ref 0.0–4.4)
Cholesterol, Total: 233 mg/dL — ABNORMAL HIGH (ref 100–199)
HDL: 49 mg/dL (ref >39)
LDL Chol Calc (NIH): 162 mg/dL — ABNORMAL HIGH (ref 0–99)
Triglycerides: 121 mg/dL (ref 0–149)
VLDL Cholesterol Cal: 22 mg/dL (ref 5–40)

## 2021-11-17 LAB — CMP14+EGFR
ALT: 17 IU/L (ref 0–32)
AST: 13 IU/L (ref 0–40)
Albumin/Globulin Ratio: 1.4 (ref 1.2–2.2)
Albumin: 4.1 g/dL (ref 3.8–4.8)
Alkaline Phosphatase: 103 IU/L (ref 44–121)
BUN/Creatinine Ratio: 10 (ref 9–23)
BUN: 8 mg/dL (ref 6–24)
Bilirubin Total: 0.3 mg/dL (ref 0.0–1.2)
CO2: 27 mmol/L (ref 20–29)
Calcium: 9.5 mg/dL (ref 8.7–10.2)
Chloride: 99 mmol/L (ref 96–106)
Creatinine, Ser: 0.84 mg/dL (ref 0.57–1.00)
Globulin, Total: 2.9 g/dL (ref 1.5–4.5)
Glucose: 152 mg/dL — ABNORMAL HIGH (ref 70–99)
Potassium: 4.4 mmol/L (ref 3.5–5.2)
Sodium: 138 mmol/L (ref 134–144)
Total Protein: 7 g/dL (ref 6.0–8.5)
eGFR: 86 mL/min/{1.73_m2} (ref >59)

## 2021-11-17 LAB — HCV AB W REFLEX TO QUANT PCR: HCV Ab: NONREACTIVE

## 2021-11-17 LAB — MAGNESIUM: Magnesium: 2 mg/dL (ref 1.6–2.3)

## 2021-11-17 LAB — VITAMIN D 25 HYDROXY (VIT D DEFICIENCY, FRACTURES): Vit D, 25-Hydroxy: 27.4 ng/mL — ABNORMAL LOW (ref 30.0–100.0)

## 2021-11-20 ENCOUNTER — Encounter: Payer: Self-pay | Admitting: Family Medicine

## 2021-11-20 ENCOUNTER — Other Ambulatory Visit: Payer: Self-pay | Admitting: Family Medicine

## 2021-11-20 DIAGNOSIS — R601 Generalized edema: Secondary | ICD-10-CM

## 2021-11-20 DIAGNOSIS — I10 Essential (primary) hypertension: Secondary | ICD-10-CM

## 2021-11-20 MED ORDER — FUROSEMIDE 40 MG PO TABS: 40.0000 mg | ORAL_TABLET | Freq: Two times a day (BID) | ORAL | 2 refills | Status: DC

## 2021-11-28 DIAGNOSIS — E041 Nontoxic single thyroid nodule: Secondary | ICD-10-CM | POA: Diagnosis not present

## 2021-11-28 DIAGNOSIS — R1310 Dysphagia, unspecified: Secondary | ICD-10-CM | POA: Diagnosis not present

## 2021-12-07 ENCOUNTER — Telehealth: Payer: Self-pay | Admitting: Family Medicine

## 2021-12-07 DIAGNOSIS — Z1211 Encounter for screening for malignant neoplasm of colon: Secondary | ICD-10-CM

## 2021-12-07 NOTE — Telephone Encounter (Signed)
Pt tried to schedule her colonoscopy before the end of the yr but she needs a new referral. It has been more than 3 years. Pt has been going to that office since she was 22. ? ?REFERRAL REQUEST ?Telephone Note ? ?Have you been seen at our office for this problem? Yes but it has been more than 3 years ?(Advise that they may need an appointment with their PCP before a referral can be done) ? ?Reason for Referral: colonoscopy ?Referral discussed with patient: no  ?Best contact number of patient for referral team: [phone]    ?Has patient been seen by a specialist for this issue before: yes but needs new referral Dr Laural Golden in [place]  ?Patient provider preference for referral: Dr Kyung Bacca ?Patient location preference for referral: Dr Kyung Bacca ?  ?Patient notified that referrals can take up to a week or longer to process. If they haven't heard anything within a week they should call back and speak with the referral department.  ? ?

## 2021-12-07 NOTE — Telephone Encounter (Signed)
Referral placed.

## 2021-12-07 NOTE — Telephone Encounter (Signed)
Okay to place referral

## 2021-12-11 ENCOUNTER — Encounter (INDEPENDENT_AMBULATORY_CARE_PROVIDER_SITE_OTHER): Payer: Self-pay | Admitting: *Deleted

## 2021-12-11 ENCOUNTER — Encounter: Payer: Self-pay | Admitting: Neurology

## 2021-12-14 ENCOUNTER — Institutional Professional Consult (permissible substitution): Payer: BC Managed Care – PPO | Admitting: Neurology

## 2021-12-19 ENCOUNTER — Encounter: Payer: Self-pay | Admitting: Family Medicine

## 2021-12-19 ENCOUNTER — Ambulatory Visit (INDEPENDENT_AMBULATORY_CARE_PROVIDER_SITE_OTHER): Payer: BC Managed Care – PPO | Admitting: Family Medicine

## 2021-12-19 VITALS — BP 150/65 | HR 76 | Temp 97.6°F | Ht 66.0 in | Wt 350.8 lb

## 2021-12-19 DIAGNOSIS — G479 Sleep disorder, unspecified: Secondary | ICD-10-CM | POA: Diagnosis not present

## 2021-12-19 DIAGNOSIS — Z6841 Body Mass Index (BMI) 40.0 and over, adult: Secondary | ICD-10-CM

## 2021-12-19 DIAGNOSIS — E559 Vitamin D deficiency, unspecified: Secondary | ICD-10-CM | POA: Diagnosis not present

## 2021-12-19 DIAGNOSIS — F515 Nightmare disorder: Secondary | ICD-10-CM | POA: Diagnosis not present

## 2021-12-19 DIAGNOSIS — F4312 Post-traumatic stress disorder, chronic: Secondary | ICD-10-CM

## 2021-12-19 MED ORDER — PRAZOSIN HCL 1 MG PO CAPS: 1.0000 mg | ORAL_CAPSULE | Freq: Every day | ORAL | 5 refills | Status: DC

## 2021-12-19 NOTE — Progress Notes (Signed)
? ?Assessment & Plan:  ?1. Difficulty sleeping ?Well controlled on current regimen.  ? ?2. Nightmares associated with chronic post-traumatic stress disorder ?Well controlled on current regimen.  ?- prazosin (MINIPRESS) 1 MG capsule; Take 1 capsule (1 mg total) by mouth at bedtime.  Dispense: 30 capsule; Refill: 5 ? ?3. Vitamin D deficiency ?Continue supplement and going outside. ? ?4. Morbid obesity with BMI of 50.0-59.9, adult ([place]) ?Continue staying active and increasing activity. ? ? ?Return in about 3 months (around 03/21/2022) for annual physical. ? ?Hendricks Limes, MSN, APRN, FNP-C ?[facility] ? ?Subjective:  ? ? Patient ID: Kathryn Jimenez, female    DOB: 04-02-1971, 51 y.o.   MRN: [id number] ? ?Patient Care Team: ?Loman Brooklyn, FNP as PCP - General (Family Medicine) ?Jacelyn Pi, MD as Consulting Physician (Endocrinology) ?Rogene Houston, MD as Consulting Physician (Gastroenterology) ?Sandford Craze, MD as Consulting Physician (Dermatology) ?Case, Reche Dixon, MD as Consulting Physician (Orthopedic Surgery)  ? ?Chief Complaint:  ?Chief Complaint  ?Patient presents with  ? Sleeping Problem  ?  4 week follow up- patient states it is better  ? ? ?HPI: ?Kathryn Jimenez is a 51 y.o. female presenting on 12/19/2021 for Sleeping Problem (4 week follow up- patient states it is better) ? ?Sleep: At our last visit a month ago patient was started on prazosin as she has difficulty sleeping due to nightmares associated with PTSD.  She reports today she is no longer having nightmares.  She does still drain.  She is now able to go to sleep and staying asleep. ? ?Vitamin D deficiency: Patient reports she has been going outside at least an hour every day that it is sunny to get natural vitamin D. ? ?She has been going outside and trying to do work in the yard to stay active. ? ?New complaints: ?None ? ? ?Social history: ? ?Relevant past medical, surgical, family and social history reviewed and updated  as indicated. Interim medical history since our last visit reviewed. ? ?Allergies and medications reviewed and updated. ? ?DATA REVIEWED: CHART IN EPIC ? ?ROS: Negative unless specifically indicated above in HPI.  ? ? ?Current Outpatient Medications:  ?  ACCU-CHEK AVIVA PLUS test strip, SMARTSIG:Via Meter, Disp: , Rfl:  ?  acetaminophen (TYLENOL) 500 MG tablet, Take 500 mg by mouth every 6 (six) hours as needed for mild pain or moderate pain., Disp: , Rfl:  ?  albuterol (VENTOLIN HFA) 108 (90 Base) MCG/ACT inhaler, 2 PUFFS INTO THE LUNGS EVERY 6 HOURS AS NEEDED FOR WHEEZING OR SHORTNESS OF BREATH., Disp: 6.7 g, Rfl: 0 ?  buPROPion (WELLBUTRIN XL) 150 MG 24 hr tablet, Take 150 mg by mouth daily., Disp: , Rfl:  ?  clobetasol cream (TEMOVATE) 2.83 %, Apply 1 application topically 2 (two) times daily as needed (scalp psoriasis)., Disp: 30 g, Rfl: 0 ?  clopidogrel (PLAVIX) 75 MG tablet, Take 1 tablet (75 mg total) by mouth daily., Disp: 90 tablet, Rfl: 1 ?  cycloSPORINE (RESTASIS) 0.05 % ophthalmic emulsion, Apply 1 drop to eye 2 (two) times daily., Disp: , Rfl:  ?  diphenhydrAMINE (BENADRYL) 25 mg capsule, Take 25 mg by mouth every 6 (six) hours as needed for allergies., Disp: , Rfl:  ?  docusate sodium (COLACE) 100 MG capsule, Take 2 capsules (200 mg total) by mouth at bedtime. (Patient taking differently: Take 200 mg by mouth as needed for moderate constipation or mild constipation.), Disp: , Rfl: 0 ?  FLUoxetine (PROZAC) 40 MG capsule,  Take 40 mg by mouth daily., Disp: , Rfl:  ?  furosemide (LASIX) 40 MG tablet, Take 1 tablet (40 mg total) by mouth 2 (two) times daily., Disp: 60 tablet, Rfl: 2 ?  Incontinence Supply Disposable (PREVAIL IB FULL MAT BRIEF 2XL) MISC, 1 each by Does not apply route every 2 (two) hours as needed., Disp: 96 each, Rfl: 5 ?  insulin aspart (NOVOLOG) 100 UNIT/ML injection, Inject into the skin 3 (three) times daily before meals. Per pump, Disp: , Rfl:  ?  levonorgestrel (LILETTA, 52 MG,) 20.1  MCG/DAY IUD, 1 each by Intrauterine route once. Inserted 04/19/21, Disp: , Rfl:  ?  LORazepam (ATIVAN) 0.5 MG tablet, Take 0.25-0.5 mg by mouth daily as needed for anxiety. Takes 0.'25mg'$  - 0.'50mg'$ , 1-2 time daily as needed., Disp: , Rfl:  ?  MAGNESIUM GLYCINATE PO, Take 200-400 mg by mouth at bedtime. 200 mg each, Disp: , Rfl:  ?  nystatin (MYCOSTATIN/NYSTOP) powder, Apply 1 application topically daily as needed (fungal)., Disp: , Rfl:  ?  OVER THE COUNTER MEDICATION, Take 1-2 drops by mouth daily as needed (Thydoid Function). Potassium Iodide 4 %  Iodine 2 % Distilled water, Disp: , Rfl:  ?  potassium chloride (KLOR-CON) 10 MEQ tablet, Take 2 tablets (20 mEq total) by mouth daily., Disp: 180 tablet, Rfl: 1 ?  prazosin (MINIPRESS) 1 MG capsule, Take 1 capsule (1 mg total) by mouth at bedtime., Disp: 30 capsule, Rfl: 2 ?  valsartan (DIOVAN) 320 MG tablet, Take 1 tablet (320 mg total) by mouth daily., Disp: 30 tablet, Rfl: 2 ?  Vitamin D, Ergocalciferol, (DRISDOL) 1.25 MG (50000 UNIT) CAPS capsule, TAKE 1 CAPSULE EVERY 7 DAYS., Disp: 12 capsule, Rfl: 1 ?  ezetimibe (ZETIA) 10 MG tablet, TAKE 1 TABLET ONCE DAILY. (Patient not taking: Reported on 10/18/2021), Disp: 30 tablet, Rfl: 0  ? ?Allergies  ?Allergen Reactions  ? Actos [Pioglitazone] Swelling  ? Augmentin [Amoxicillin-Pot Clavulanate] Nausea Only  ?  Causes severe diarrhea  ? Invokana [Canagliflozin] Other (See Comments)  ?  Yeast infection  ? Lopid [Gemfibrozil] Other (See Comments)  ?  Burning tounge  ? Metoprolol Other (See Comments)  ?  headaches  ? Naproxen Sodium Other (See Comments)  ?  Patient states she felt loopy  ? Other Other (See Comments)  ? Statins Other (See Comments)  ?  Joint pains ?Joint pains  ? Amlodipine Cough  ? Hctz [Hydrochlorothiazide] Rash  ? Lisinopril Cough  ? Morphine And Related Itching and Rash  ? ?Past Medical History:  ?Diagnosis Date  ? Allergy   ? Anginal pain ([place])   ? Anxiety   ? Arthritis   ? Asthma   ? Bladder disorder, other    ? Borderline personality disorder ([employer])   ? Carotid artery disease ([place]) 03/05/2017  ? Depression   ? Diabetes mellitus without complication ([place])   ? GERD (gastroesophageal reflux disease)   ? Headache   ? Hyperlipidemia   ? Hypertension   ? Kidney stones   ? Mucous retention cyst of maxillary sinus 08/31/2021  ? Noted on head CT  ? Neuromuscular disorder ([place])   ? Neuropathy  ? PTSD (post-traumatic stress disorder)   ? Shortness of breath   ? Sleep apnea   ? Stroke [facility]) 2018  ? Thyroid disease   ? nodule  ?  ?Past Surgical History:  ?Procedure Laterality Date  ? CARPAL TUNNEL RELEASE    ? left 2015  ? CERVICAL FUSION Bilateral 2003  ?  C 5-6 with titanium screws  ? CHOLECYSTECTOMY    ? COLONOSCOPY WITH PROPOFOL N/A 06/24/2014  ? Procedure: COLONOSCOPY WITH PROPOFOL;  Surgeon: Rogene Houston, MD;  Location: [facility];  Service: Endoscopy;  Laterality: N/A;  cecum time in 0810    time out   0821    total time 11 minutes  ? DILITATION & CURRETTAGE/HYSTROSCOPY WITH HYDROTHERMAL ABLATION N/A 04/19/2021  ? Procedure: DILATATION & CURETTAGE/HYSTEROSCOPY WITH HYDROTHERMAL ABLATION;  Surgeon: Janyth Pupa, DO;  Location: [facility];  Service: Gynecology;  Laterality: N/A;  ? INTRAUTERINE DEVICE (IUD) INSERTION N/A 04/19/2021  ? Procedure: INTRAUTERINE DEVICE (IUD) INSERTION;  Surgeon: Janyth Pupa, DO;  Location: [facility];  Service: Gynecology;  Laterality: N/A;  ? POLYPECTOMY N/A 06/24/2014  ? Procedure: RECTAL POLYPECTOMY;  Surgeon: Rogene Houston, MD;  Location: [facility];  Service: Endoscopy;  Laterality: N/A;  ? SPINE SURGERY    ? [place]  ? WISDOM TOOTH EXTRACTION Bilateral 2001 and 1990s  ? top and bottom   ?  ?Social History  ? ?Socioeconomic History  ? Marital status: Married  ?  Spouse name: [name]  ? Number of children: 0  ? Years of education: HS  ? Highest education level: Not on file  ?Occupational History  ? Occupation: Disabled  ?Tobacco Use  ? Smoking status: Never  ? Smokeless tobacco: Never   ?Vaping Use  ? Vaping Use: Never used  ?Substance and Sexual Activity  ? Alcohol use: No  ?  Comment: Rarely  ? Drug use: No  ? Sexual activity: Not Currently  ?  Birth control/protection: I.U.D.  ?Other To

## 2022-01-02 ENCOUNTER — Telehealth: Payer: Self-pay | Admitting: Family Medicine

## 2022-01-02 NOTE — Telephone Encounter (Signed)
Left message to return call 

## 2022-01-02 NOTE — Telephone Encounter (Signed)
Patient returning call. Please call back

## 2022-01-03 ENCOUNTER — Encounter: Payer: Self-pay | Admitting: Nurse Practitioner

## 2022-01-03 ENCOUNTER — Ambulatory Visit (INDEPENDENT_AMBULATORY_CARE_PROVIDER_SITE_OTHER): Payer: BC Managed Care – PPO | Admitting: Nurse Practitioner

## 2022-01-03 VITALS — BP 133/74 | HR 63 | Temp 98.7°F | Ht 66.0 in | Wt 350.4 lb

## 2022-01-03 DIAGNOSIS — L720 Epidermal cyst: Secondary | ICD-10-CM | POA: Diagnosis not present

## 2022-01-03 NOTE — Patient Instructions (Signed)
Epidermoid Cyst  An epidermoid cyst, also called an epidermal cyst, is a small lump under your skin. The cyst contains a substance called keratin. Do not try to pop or open the cyst yourself. What are the causes? A blocked hair follicle. A hair that curls and re-enters the skin instead of growing straight out of the skin. A blocked pore. Irritated skin. An injury to the skin. Certain conditions that are passed along from parent to child. Human papillomavirus (HPV). This happens rarely when cysts occur on the bottom of the feet. Long-term sun damage to the skin. What increases the risk? Having acne. Being female. Having an injury to the skin. Being past puberty. Having certain conditions caused by genes (genetic disorder) What are the signs or symptoms? These cysts are usually harmless, but they can get infected. Symptoms of infection may include: Redness. Inflammation. Tenderness. Warmth. Fever. A bad-smelling substance that drains from the cyst. Pus that drains from the cyst. How is this treated? In many cases, epidermoid cysts go away on their own without treatment. If a cyst becomes infected, treatment may include: Opening and draining the cyst, done by a doctor. After draining, you may need minor surgery to remove the rest of the cyst. Antibiotic medicine. Shots of medicines (steroids) that help to reduce inflammation. Surgery to remove the cyst. Surgery may be done if the cyst: Becomes large. Bothers you. Has a chance of turning into cancer. Do not try to open a cyst yourself. Follow these instructions at home: Medicines Take over-the-counter and prescription medicines as told by your doctor. If you were prescribed an antibiotic medicine, take it as told by your doctor. Do not stop taking it even if you start to feel better. General instructions Keep the area around your cyst clean and dry. Wear loose, dry clothing. Avoid touching your cyst. Check your cyst every day  for signs of infection. Check for: Redness, swelling, or pain. Fluid or blood. Warmth. Pus or a bad smell. Keep all follow-up visits. How is this prevented? Wear clean, dry, clothing. Avoid wearing tight clothing. Keep your skin clean and dry. Take showers or baths every day. Contact a doctor if: Your cyst has symptoms of infection. Your condition does not improve or gets worse. You have a cyst that looks different from other cysts you have had. You have a fever. Get help right away if: Redness spreads from the cyst into the area close by. Summary An epidermoid cyst is a small lump under your skin. If a cyst becomes infected, treatment may include surgery to open and drain the cyst, or to remove it. Take over-the-counter and prescription medicines only as told by your doctor. Contact a doctor if your condition is not improving or is getting worse. Keep all follow-up visits. This information is not intended to replace advice given to you by your health care provider. Make sure you discuss any questions you have with your health care provider. Document Revised: 11/11/2019 Document Reviewed: 11/11/2019 Elsevier Patient Education  2023 Elsevier Inc.  

## 2022-01-03 NOTE — Progress Notes (Signed)
? ?  Acute Office Visit ? ?Subjective:  ? ?  ?Patient ID: Kathryn Jimenez, female    DOB: 31-Jan-1971, 52 y.o.   MRN: [id number] ? ?Chief Complaint  ?Patient presents with  ? skin check  ?  On back of neck , in hair line it has gotten bigger over the last year   ? ? ?HPI ?Patient is in today for cyst behind his neck.  Patient reports this has been present for a year, in the last 3 days patient reports the cyst has flared up causing pain and mild discomfort.  No fever, nausea, headache or visual changes associated with current symptoms ? ?Review of Systems  ?Constitutional: Negative.   ?HENT: Negative.    ?Eyes: Negative.   ?Respiratory: Negative.    ?Cardiovascular: Negative.   ?Skin: Negative.   ?     Sebaceous cyst back of neck  ?All other systems reviewed and are negative. ? ? ?   ?Objective:  ?  ?BP 133/74   Pulse 63   Temp 98.7 ?F (37.1 ?C)   Ht '5\' 6"'$  (1.676 m)   Wt (!) 350 lb 6.4 oz (158.9 kg)   SpO2 97%   BMI 56.56 kg/m?  ?BP Readings from Last 3 Encounters:  ?01/03/22 133/74  ?12/19/21 (!) 150/65  ?11/16/21 (!) 153/76  ? ?Wt Readings from Last 3 Encounters:  ?01/03/22 (!) 350 lb 6.4 oz (158.9 kg)  ?12/19/21 (!) 350 lb 12.8 oz (159.1 kg)  ?11/16/21 (!) 349 lb 9.6 oz (158.6 kg)  ? ?  ? ?Physical Exam ?Vitals and nursing note reviewed.  ?Constitutional:   ?   Appearance: Normal appearance.  ?HENT:  ?   Head: Normocephalic.  ?   Right Ear: External ear normal.  ?   Left Ear: External ear normal.  ?   Nose: Nose normal.  ?   Mouth/Throat:  ?   Mouth: Mucous membranes are moist.  ?   Pharynx: Oropharynx is clear.  ?Eyes:  ?   Conjunctiva/sclera: Conjunctivae normal.  ?Cardiovascular:  ?   Rate and Rhythm: Normal rate and regular rhythm.  ?   Pulses: Normal pulses.  ?   Heart sounds: Normal heart sounds.  ?Pulmonary:  ?   Effort: Pulmonary effort is normal.  ?   Breath sounds: Normal breath sounds.  ?Abdominal:  ?   General: Bowel sounds are normal.  ?Musculoskeletal:     ?   General: Normal range of motion.   ?Skin: ?   General: Skin is warm.  ?   Findings: Rash present. No lesion. Rash is nodular.  ? ?    ?   Comments: Nodular cyst present back of neck  ?Neurological:  ?   Mental Status: She is alert and oriented to person, place, and time.  ? ? ?No results found for any visits on 01/03/22. ? ? ?   ?Assessment & Plan:  ?Nodule cyst assessed with erythema and tenderness. ?Symptoms unresolved. ?-Apply warm compress ?-We will complete ultrasound ? ?Patient is to follow-up with worsening or unresolved symptoms. ?Problem List Items Addressed This Visit   ?None ?Visit Diagnoses   ? ? Epidermoid cyst    -  Primary  ? Relevant Orders  ? US Soft Tissue Head/Neck (NON-THYROID)  ? ?  ? ? ?No orders of the defined types were placed in this encounter. ? ? ?Return if symptoms worsen or fail to improve. ? ?Ivy Lynn, NP ? ? ?

## 2022-01-03 NOTE — Telephone Encounter (Signed)
Patient just wanted to give Britney an update. ?Since we last seen her her husband has been taken out of work and she is taking care of him due to wounds on his feet. ?Due to this she was not able to see Dr. Bubba Camp last week. ?She has called [facility] to r/s her apt. ?Has an appointment with them in October.  ? ?FYI  ?

## 2022-01-05 ENCOUNTER — Telehealth: Payer: Self-pay | Admitting: Family Medicine

## 2022-01-09 NOTE — Telephone Encounter (Signed)
Left message advising pt to call back with any further questions or concerns regarding ultrasound tomorrow.

## 2022-01-09 NOTE — Telephone Encounter (Signed)
Please make sure patient has no further questions.

## 2022-01-10 ENCOUNTER — Ambulatory Visit (HOSPITAL_COMMUNITY)
Admission: RE | Admit: 2022-01-10 | Discharge: 2022-01-10 | Disposition: A | Discharge status: Home / Self Care | Payer: BC Managed Care – PPO | Source: Ambulatory Visit | Attending: Nurse Practitioner | Admitting: Nurse Practitioner

## 2022-01-10 DIAGNOSIS — E119 Type 2 diabetes mellitus without complications: Secondary | ICD-10-CM | POA: Diagnosis not present

## 2022-01-10 DIAGNOSIS — L720 Epidermal cyst: Secondary | ICD-10-CM | POA: Insufficient documentation

## 2022-01-10 DIAGNOSIS — M542 Cervicalgia: Secondary | ICD-10-CM | POA: Diagnosis not present

## 2022-01-15 ENCOUNTER — Other Ambulatory Visit: Payer: Self-pay | Admitting: Nurse Practitioner

## 2022-01-15 DIAGNOSIS — L720 Epidermal cyst: Secondary | ICD-10-CM

## 2022-01-17 ENCOUNTER — Telehealth: Payer: Self-pay | Admitting: Family Medicine

## 2022-01-17 NOTE — Telephone Encounter (Signed)
Patient aware of [place] results and verbalizes understanding.

## 2022-01-18 ENCOUNTER — Other Ambulatory Visit: Payer: Self-pay | Admitting: Family Medicine

## 2022-01-18 DIAGNOSIS — I10 Essential (primary) hypertension: Secondary | ICD-10-CM

## 2022-01-24 DIAGNOSIS — E7800 Pure hypercholesterolemia, unspecified: Secondary | ICD-10-CM | POA: Diagnosis not present

## 2022-01-24 DIAGNOSIS — E1165 Type 2 diabetes mellitus with hyperglycemia: Secondary | ICD-10-CM | POA: Diagnosis not present

## 2022-01-24 DIAGNOSIS — E041 Nontoxic single thyroid nodule: Secondary | ICD-10-CM | POA: Diagnosis not present

## 2022-01-24 DIAGNOSIS — I10 Essential (primary) hypertension: Secondary | ICD-10-CM | POA: Diagnosis not present

## 2022-02-12 DIAGNOSIS — E119 Type 2 diabetes mellitus without complications: Secondary | ICD-10-CM | POA: Diagnosis not present

## 2022-02-14 ENCOUNTER — Ambulatory Visit: Payer: BC Managed Care – PPO | Admitting: Neurology

## 2022-03-09 ENCOUNTER — Other Ambulatory Visit: Payer: Self-pay | Admitting: Family Medicine

## 2022-03-09 DIAGNOSIS — E559 Vitamin D deficiency, unspecified: Secondary | ICD-10-CM

## 2022-03-20 DIAGNOSIS — F329 Major depressive disorder, single episode, unspecified: Secondary | ICD-10-CM | POA: Diagnosis not present

## 2022-03-21 ENCOUNTER — Ambulatory Visit (INDEPENDENT_AMBULATORY_CARE_PROVIDER_SITE_OTHER): Payer: BC Managed Care – PPO | Admitting: Family Medicine

## 2022-03-21 ENCOUNTER — Encounter: Payer: Self-pay | Admitting: Family Medicine

## 2022-03-21 VITALS — BP 138/72 | HR 78 | Temp 96.4°F | Ht 66.0 in | Wt 349.0 lb

## 2022-03-21 DIAGNOSIS — E7800 Pure hypercholesterolemia, unspecified: Secondary | ICD-10-CM | POA: Diagnosis not present

## 2022-03-21 DIAGNOSIS — Z0000 Encounter for general adult medical examination without abnormal findings: Secondary | ICD-10-CM | POA: Diagnosis not present

## 2022-03-21 DIAGNOSIS — E559 Vitamin D deficiency, unspecified: Secondary | ICD-10-CM | POA: Diagnosis not present

## 2022-03-21 DIAGNOSIS — Z0001 Encounter for general adult medical examination with abnormal findings: Secondary | ICD-10-CM | POA: Diagnosis not present

## 2022-03-21 DIAGNOSIS — F4310 Post-traumatic stress disorder, unspecified: Secondary | ICD-10-CM

## 2022-03-21 DIAGNOSIS — E1142 Type 2 diabetes mellitus with diabetic polyneuropathy: Secondary | ICD-10-CM

## 2022-03-21 DIAGNOSIS — R601 Generalized edema: Secondary | ICD-10-CM

## 2022-03-21 DIAGNOSIS — I10 Essential (primary) hypertension: Secondary | ICD-10-CM

## 2022-03-21 DIAGNOSIS — F331 Major depressive disorder, recurrent, moderate: Secondary | ICD-10-CM

## 2022-03-21 DIAGNOSIS — E538 Deficiency of other specified B group vitamins: Secondary | ICD-10-CM

## 2022-03-21 DIAGNOSIS — Z794 Long term (current) use of insulin: Secondary | ICD-10-CM

## 2022-03-21 DIAGNOSIS — Z6841 Body Mass Index (BMI) 40.0 and over, adult: Secondary | ICD-10-CM

## 2022-03-21 DIAGNOSIS — I679 Cerebrovascular disease, unspecified: Secondary | ICD-10-CM

## 2022-03-21 MED ORDER — POTASSIUM CHLORIDE ER 10 MEQ PO TBCR: 20.0000 meq | EXTENDED_RELEASE_TABLET | Freq: Every day | ORAL | 1 refills | Status: DC

## 2022-03-21 MED ORDER — CLOPIDOGREL BISULFATE 75 MG PO TABS: 75.0000 mg | ORAL_TABLET | Freq: Every day | ORAL | 1 refills | Status: DC

## 2022-03-21 NOTE — Progress Notes (Signed)
Assessment & Plan:  Well adult exam Discussed health benefits of physical activity, and encouraged her to engage in regular exercise appropriate for her age and condition. Preventive health education provided.  Declined influenza vaccine, Prevnar 20, and COVID-vaccine.  She will schedule her colonoscopy and mammogram at a later time.  Immunization History  Administered Date(s) Administered   Influenza,inj,Quad PF,6+ Mos 07/01/2017, 05/29/2018   Influenza-Unspecified 05/25/2014, 07/03/2016   Tdap 01/26/2010, 01/21/2020   Health Maintenance  Topic Date Due   MAMMOGRAM  11/29/2018   COLONOSCOPY (Pts 45-10yr Insurance coverage will need to be confirmed)  06/25/2019   HEMOGLOBIN A1C  10/17/2021   COVID-19 Vaccine (1) 04/06/2022 (Originally 08/25/1971)   INFLUENZA VACCINE  11/18/2022 (Originally 03/20/2022)   OPHTHALMOLOGY EXAM  09/26/2022   PAP SMEAR-Modifier  11/23/2024   TETANUS/TDAP  01/20/2030   Hepatitis C Screening  Completed   HIV Screening  Completed   HPV VACCINES  Aged Out   FOOT EXAM  Discontinued   - CMP14+EGFR - Lipid panel  2. Essential hypertension Well controlled on current regimen.  - potassium chloride (KLOR-CON) 10 MEQ tablet; Take 2 tablets (20 mEq total) by mouth daily.  Dispense: 180 tablet; Refill: 1 - CMP14+EGFR - Lipid panel  3. Generalized edema Well controlled on current regimen.  - CMP14+EGFR  4. Small vessel disease, cerebrovascular Continue Plavix.  Patient is unable to tolerate statins or Zetia.  She is on agreeable to a PCSK9 inhibitor. - clopidogrel (PLAVIX) 75 MG tablet; Take 1 tablet (75 mg total) by mouth daily.  Dispense: 90 tablet; Refill: 1 - Lipid panel  5. High cholesterol Uncontrolled. Patient is unable to tolerate statins or Zetia.  She is on agreeable to a PCSK9 inhibitor. - Lipid panel  6. Morbid obesity with BMI of 50.0-59.9, adult (HPevely Encouraged healthy eating and exercise.  7. Vitamin D deficiency Labs to assess. -  VITAMIN D 25 Hydroxy (Vit-D Deficiency, Fractures)  8. Vitamin B12 deficiency Labs to assess. - Vitamin B12  9. Type 2 diabetes mellitus with diabetic polyneuropathy, with long-term current use of insulin ([place] Man[place] by endocrinology.  10. Moderate episode of recurrent major depressive disorder ([place] Managed by psychiatry.  11. PTSD (post-traumatic stress disorder) Managed by psychiatry.   Follow-up: Return in about 3 months (around 06/21/2022) for follow-up of chronic medication conditions with Je.   BHendricks Limes MSN, APRN, FNP-C [facility]  Subjective:  Patient ID: Kathryn Jimenez, female    DOB: [birth year] Age: 51y.o. MRN: [id number] Patient Care Team: JLoman Brooklyn FNP as PCP - General (Family Medicine) BJacelyn Pi MD as Consulting Physician (Endocrinology) RRogene Houston MD as Consulting Physician (Gastroenterology) BSandford Craze MD as Consulting Physician (Dermatology) Case, SReche Dixon MD as Consulting Physician (Orthopedic Surgery)   CC:  Chief Complaint  Patient presents with   Annual Exam    HPI Kathryn Jimenez is a 51y.o. female who presents today for a complete physical exam. She reports consuming a general diet. Home exercise routine includes housework and yard work. She generally feels well. She reports sleeping fairly well. She does not have additional problems to discuss today.   Vision:Within last year Dental:No regular dental care  Depression/PTSD: [facility] [place] [facility].     03/21/2022    2:54 PM 01/03/2022   11:31 AM 12/19/2021    9:57 AM  Depression screen PHQ 2/9  Decreased Interest 1 0 1  Down, Depressed, Hopeless  1 0 1  PHQ - 2 Score 2 0 2  Altered sleeping 1 0 1  Tired, decreased energy 1 0 1  Change in appetite 1 0 1  Feeling bad or failure about yourself  0 0 0  Trouble concentrating 2 0 1  Moving slowly or fidgety/restless 1 0 1  Suicidal thoughts 0 0 0   PHQ-9 Score 8 0 7  Difficult doing work/chores Somewhat difficult  Somewhat difficult      03/21/2022    2:54 PM 12/19/2021    9:57 AM 11/16/2021    9:48 AM 10/18/2021    9:20 AM  GAD 7 : Generalized Anxiety Score  Nervous, Anxious, on Edge _0 Control/stop worrying 1 0 1 1  Worry too much - different things _1 Trouble relaxing _2 Restless _3 0  Easily annoyed or irritable _4 Afraid - awful might happen _5 Total GAD 7 Score _6 Anxiety Difficulty Somewhat difficult Somewhat difficult Somewhat difficult Very difficult    Sleep: Previously started on prazosin for difficulty sleeping due to nightmares associated with PTSD, which was helpful.  Vitamin D deficiency: Patient tries to get outside daily for at least an hour to get sun.  She is also taking the weekly vitamin D supplement.  Vitamin B12 deficiency: Unable to afford a B12 supplement.  Hypertension: Patient reports readings in the 130s/70s when she has had her blood pressure checked at other doctors offices.  Hyperlipidemia/cerebrovascular disease: Unable to tolerate statins or Zetia.  Unagreeable in using a PCSK9 inhibitor.  Diabetes: Managed by endocrinology who she last saw 1 to 2 months ago.  They are working on getting her a new insulin pump.   Review of Systems  Constitutional:  Negative for chills, fever, malaise/fatigue and weight loss.  HENT:  Negative for congestion, ear discharge, ear pain, nosebleeds, sinus pain, sore throat and tinnitus.   Eyes:  Negative for blurred vision, double vision, pain, discharge and redness.  Respiratory:  Negative for cough, shortness of breath and wheezing.   Cardiovascular:  Negative for chest pain, palpitations and leg swelling.  Gastrointestinal:  Negative for abdominal pain, constipation, diarrhea, heartburn, nausea and vomiting.  Genitourinary:  Negative for dysuria, frequency and urgency.  Musculoskeletal:  Negative for myalgias.  Skin:   Negative for rash.  Neurological:  Negative for dizziness, seizures, weakness and headaches.  Psychiatric/Behavioral:  Negative for depression, substance abuse and suicidal ideas. The patient is not nervous/anxious.      Current Outpatient Medications:    ACCU-CHEK AVIVA PLUS test strip, SMARTSIG:Via Meter, Disp: , Rfl:    acetaminophen (TYLENOL) 500 MG tablet, Take 500 mg by mouth every 6 (six) hours as needed for mild pain or moderate pain., Disp: , Rfl:    albuterol (VENTOLIN HFA) 108 (90 Base) MCG/ACT inhaler, 2 PUFFS INTO THE LUNGS EVERY 6 HOURS AS NEEDED FOR WHEEZING OR SHORTNESS OF BREATH., Disp: 6.7 g, Rfl: 0   buPROPion (WELLBUTRIN XL) 150 MG 24 hr tablet, Take 150 mg by mouth daily., Disp: , Rfl:    clobetasol cream (TEMOVATE) 0.62 %, Apply 1 application topically 2 (two) times daily as needed (scalp psoriasis)., Disp: 30 g, Rfl: 0   clopidogrel (PLAVIX) 75 MG tablet, Take 1 tablet (75 mg total) by mouth daily., Disp: 90 tablet, Rfl: 1   cycloSPORINE (RESTASIS) 0.05 % ophthalmic emulsion, Apply 1 drop  to eye 2 (two) times daily., Disp: , Rfl:    diphenhydrAMINE (BENADRYL) 25 mg capsule, Take 25 mg by mouth every 6 (six) hours as needed for allergies., Disp: , Rfl:    FLUoxetine (PROZAC) 40 MG capsule, Take 40 mg by mouth daily., Disp: , Rfl:    furosemide (LASIX) 40 MG tablet, Take 1 tablet (40 mg total) by mouth 2 (two) times daily., Disp: 60 tablet, Rfl: 2   Incontinence Supply Disposable (PREVAIL IB FULL MAT BRIEF 2XL) MISC, 1 each by Does not apply route every 2 (two) hours as needed., Disp: 96 each, Rfl: 5   insulin aspart (NOVOLOG) 100 UNIT/ML injection, Inject into the skin 3 (three) times daily before meals. Per pump, Disp: , Rfl:    Insulin Disposable Pump (OMNIPOD CLASSIC PODS, GEN 3,) MISC, change every 48 hours, Disp: , Rfl:    levonorgestrel (LILETTA, 52 MG,) 20.1 MCG/DAY IUD, 1 each by Intrauterine route once. Inserted 04/19/21, Disp: , Rfl:    LORazepam (ATIVAN) 0.5 MG  tablet, Take 0.25-0.5 mg by mouth daily as needed for anxiety. Takes 0.52m - 0.557m 1-2 time daily as needed., Disp: , Rfl:    MAGNESIUM GLYCINATE PO, Take 200-400 mg by mouth at bedtime. 200 mg each, Disp: , Rfl:    nystatin (MYCOSTATIN/NYSTOP) powder, Apply 1 application topically daily as needed (fungal)., Disp: , Rfl:    OVER THE COUNTER MEDICATION, Take 1-2 drops by mouth daily as needed (Thydoid Function). Potassium Iodide 4 %  Iodine 2 % Distilled water, Disp: , Rfl:    potassium chloride (KLOR-CON) 10 MEQ tablet, Take 2 tablets (20 mEq total) by mouth daily., Disp: 180 tablet, Rfl: 1   prazosin (MINIPRESS) 1 MG capsule, Take 1 capsule (1 mg total) by mouth at bedtime., Disp: 30 capsule, Rfl: 5   valACYclovir (VALTREX) 500 MG tablet, Take 500 mg by mouth daily., Disp: , Rfl:    valsartan (DIOVAN) 320 MG tablet, TAKE 1 TABLET ONCE DAILY., Disp: 30 tablet, Rfl: 2   Vitamin D, Ergocalciferol, (DRISDOL) 1.25 MG (50000 UNIT) CAPS capsule, TAKE 1 CAPSULE EVERY 7 DAYS., Disp: 12 capsule, Rfl: 0   docusate sodium (COLACE) 100 MG capsule, Take 2 capsules (200 mg total) by mouth at bedtime. (Patient not taking: Reported on 03/21/2022), Disp: , Rfl: 0  Allergies  Allergen Reactions   Actos [Pioglitazone] Swelling   Augmentin [Amoxicillin-Pot Clavulanate] Nausea Only    Causes severe diarrhea   Invokana [Canagliflozin] Other (See Comments)    Yeast infection   Lopid [Gemfibrozil] Other (See Comments)    Burning tounge   Metoprolol Other (See Comments)    headaches   Naproxen Sodium Other (See Comments)    Patient states she felt loopy   Other Other (See Comments)   Statins Other (See Comments)    Joint pains Joint pains   Amlodipine Cough   Hctz [Hydrochlorothiazide] Rash   Lisinopril Cough   Morphine And Related Itching and Rash    Past Medical History:  Diagnosis Date   Allergy    Anginal pain (HCC)    Anxiety    Arthritis    Asthma    Bladder disorder, other    Borderline  personality disorder ([place]   Carotid artery disease ([place]   Depression    Diabetes mellitus without complication (HCC)    GERD (gastroesophageal reflux disease)    Headache    Hyperlipidemia    Hypertension    Kidney stones    Mucous retention cyst  of maxillary sinus 08/31/2021   Noted on head CT   Neuromuscular disorder (HCC)    Neuropathy   PTSD (post-traumatic stress disorder)    Shortness of breath    Sleep apnea    Stroke ([place]) 2018   Thyroid disease    nodule    Past Surgical History:  Procedure Laterality Date   CARPAL TUNNEL RELEASE     left 2015   CERVICAL FUSION Bilateral 2003   C 5-6 with titanium screws   CHOLECYSTECTOMY     COLONOSCOPY WITH PROPOFOL N/A 06/24/2014   Procedure: COLONOSCOPY WITH PROPOFOL;  Surgeon: Rogene Houston, MD;  Location: [facility];  Service: Endoscopy;  Laterality: N/A;  cecum time in 0810    time out   0821    total time 11 minutes   DILITATION & CURRETTAGE/HYSTROSCOPY WITH HYDROTHERMAL ABLATION N/A 04/19/2021   Procedure: DILATATION & CURETTAGE/HYSTEROSCOPY WITH HYDROTHERMAL ABLATION;  Surgeon: Janyth Pupa, DO;  Location: [facility];  Service: Gynecology;  Laterality: N/A;   INTRAUTERINE DEVICE (IUD) INSERTION N/A 04/19/2021   Procedure: INTRAUTERINE DEVICE (IUD) INSERTION;  Surgeon: Janyth Pupa, DO;  Location: [facility];  Service: Gynecology;  Laterality: N/A;   POLYPECTOMY N/A 06/24/2014   Procedure: RECTAL POLYPECTOMY;  Surgeon: Rogene Houston, MD;  Location: [facility];  Service: Endoscopy;  Laterality: N/A;   SPINE SURGERY     URETHRAL DILATION  1981   WISDOM TOOTH EXTRACTION Bilateral 2001 and 1990s   top and bottom     Family History  Problem Relation Age of Onset   COPD Mother    Arthritis Mother    Asthma Mother    Depression Mother    Diabetes Mother    Heart disease Mother    Hyperlipidemia Mother    Hypertension Mother    Mental illness Mother    Skin cancer Mother    Lung cancer Father    Arthritis Father     Depression Father    Drug abuse Father    Early death Father    Kidney disease Father    Mental illness Father    Colon cancer Sister    Birth defects Sister    COPD Sister    Hyperlipidemia Sister    Learning disabilities Sister    Breast cancer Sister    Breast cancer Sister    Colon cancer Sister    Alcohol abuse Paternal Grandmother    Diabetes Paternal Grandmother    Alcohol abuse Paternal Grandfather     Social History   Socioeconomic History   Marital status: Married    Spouse name: [name]   Number of children: 0   Years of education: HS   Highest education level: Not on file  Occupational History   Occupation: Disabled  Tobacco Use   Smoking status: Never   Smokeless tobacco: Never  Vaping Use   Vaping Use: Never used  Substance and Sexual Activity   Alcohol use: No    Comment: Rarely   Drug use: No   Sexual activity: Not Currently    Birth control/protection: I.U.D.  Other Topics Concern   Not on file  Social History Narrative   Lives at home with husband.   Right-handed.   3-4 cups caffeine per day.   Social Determinants of Health   Financial Resource Strain: Medium Risk (04/03/2021)   Overall Financial Resource Strain (CARDIA)    Difficulty of Paying Living Expenses: Somewhat hard  Food Insecurity: Food Insecurity Present (04/03/2021)   Hunger Vital Sign  Worried About Charity fundraiser in the Last Year: Sometimes true    [employer] of Food in the Last Year: Never true  Transportation Needs: No Transportation Needs (04/03/2021)   PRAPARE - Hydrologist (Medical): No    Lack of Transportation (Non-Medical): No  Physical Activity: Insufficiently Active (04/03/2021)   Exercise Vital Sign    Days of Exercise per Week: 1 day    Minutes of Exercise per Session: 10 min  Stress: No Stress Concern Present (04/03/2021)   [place]    Feeling of Stress : Only a  little  Social Connections: Moderately Isolated (04/03/2021)   Social Connection and Isolation Panel [NHANES]    Frequency of Communication with Friends and Family: Once a week    Frequency of Social Gatherings with Friends and Family: Once a week    Attends Religious Services: 1 to 4 times per year    Active Member of [employer] or Organizations: No    Attends Archivist Meetings: Never    Marital Status: Married  Human resources officer Violence: Not At Risk (04/03/2021)   Humiliation, Afraid, Rape, and Kick questionnaire    Fear of Current or Ex-Partner: No    Emotionally Abused: No    Physically Abused: No    Sexually Abused: No      Objective:    BP 138/72 Comment: yesterday  Pulse 78   Temp (!) 96.4 F (35.8 C) (Temporal)   Ht $R'5\' 6"'jN$  (1.676 m)   Wt (!) 349 lb (158.3 kg)   SpO2 96%   BMI 56.33 kg/m   BP Readings from Last 3 Encounters:  03/21/22 138/72  01/03/22 133/74  12/19/21 (!) 150/65   Wt Readings from Last 3 Encounters:  03/21/22 (!) 349 lb (158.3 kg)  01/03/22 (!) 350 lb 6.4 oz (158.9 kg)  12/19/21 (!) 350 lb 12.8 oz (159.1 kg)    Physical Exam Vitals reviewed.  Constitutional:      General: She is not in acute distress.    Appearance: Normal appearance. She is morbidly obese. She is not ill-appearing, toxic-appearing or diaphoretic.  HENT:     Head: Normocephalic and atraumatic.     Right Ear: Tympanic membrane, ear canal and external ear normal. There is no impacted cerumen.     Left Ear: Tympanic membrane, ear canal and external ear normal. There is no impacted cerumen.     Nose: Nose normal. No congestion or rhinorrhea.     Mouth/Throat:     Mouth: Mucous membranes are moist.     Pharynx: Oropharynx is clear. No oropharyngeal exudate or posterior oropharyngeal erythema.  Eyes:     General: No scleral icterus.       Right eye: No discharge.        Left eye: No discharge.     Conjunctiva/sclera: Conjunctivae normal.     Pupils: Pupils are equal,  round, and reactive to light.  Cardiovascular:     Rate and Rhythm: Normal rate and regular rhythm.     Heart sounds: Normal heart sounds. No murmur heard.    No friction rub. No gallop.  Pulmonary:     Effort: Pulmonary effort is normal. No respiratory distress.     Breath sounds: Normal breath sounds. No stridor. No wheezing, rhonchi or rales.  Abdominal:     General: Abdomen is flat. Bowel sounds are normal. There is no distension.     Palpations: Abdomen  is soft. There is no hepatomegaly, splenomegaly or mass.     Tenderness: There is no abdominal tenderness. There is no guarding or rebound.     Hernia: No hernia is present.  Musculoskeletal:        General: Normal range of motion.     Cervical back: Normal range of motion and neck supple. No rigidity. No muscular tenderness.  Lymphadenopathy:     Cervical: No cervical adenopathy.  Skin:    General: Skin is warm and dry.     Capillary Refill: Capillary refill takes less than 2 seconds.  Neurological:     General: No focal deficit present.     Mental Status: She is alert and oriented to person, place, and time. Mental status is at baseline.  Psychiatric:        Mood and Affect: Mood normal.        Behavior: Behavior normal.        Thought Content: Thought content normal.        Judgment: Judgment normal.     Lab Results  Component Value Date   TSH 2.320 12/18/2016   Lab Results  Component Value Date   WBC 7.3 11/16/2021   HGB 13.7 11/16/2021   HCT 41.5 11/16/2021   MCV 87 11/16/2021   PLT 349 11/16/2021   Lab Results  Component Value Date   NA 138 11/16/2021   K 4.4 11/16/2021   CO2 27 11/16/2021   GLUCOSE 152 (H) 11/16/2021   BUN 8 11/16/2021   CREATININE 0.84 11/16/2021   BILITOT 0.3 11/16/2021   ALKPHOS 103 11/16/2021   AST 13 11/16/2021   ALT 17 11/16/2021   PROT 7.0 11/16/2021   ALBUMIN 4.1 11/16/2021   CALCIUM 9.5 11/16/2021   ANIONGAP 9 04/18/2021   EGFR 86 11/16/2021   Lab Results  Component  Value Date   CHOL 233 (H) 11/16/2021   Lab Results  Component Value Date   HDL 49 11/16/2021   Lab Results  Component Value Date   LDLCALC 162 (H) 11/16/2021   Lab Results  Component Value Date   TRIG 121 11/16/2021   Lab Results  Component Value Date   CHOLHDL 4.8 (H) 11/16/2021   Lab Results  Component Value Date   HGBA1C 8.6 (H) 04/18/2021

## 2022-03-22 ENCOUNTER — Other Ambulatory Visit: Payer: Self-pay | Admitting: Family Medicine

## 2022-03-22 DIAGNOSIS — E559 Vitamin D deficiency, unspecified: Secondary | ICD-10-CM

## 2022-03-22 LAB — CMP14+EGFR
ALT: 15 IU/L (ref 0–32)
AST: 13 IU/L (ref 0–40)
Albumin/Globulin Ratio: 1.8 (ref 1.2–2.2)
Albumin: 4.1 g/dL (ref 3.9–4.9)
Alkaline Phosphatase: 103 IU/L (ref 44–121)
BUN/Creatinine Ratio: 12 (ref 9–23)
BUN: 10 mg/dL (ref 6–24)
Bilirubin Total: 0.3 mg/dL (ref 0.0–1.2)
CO2: 24 mmol/L (ref 20–29)
Calcium: 9.6 mg/dL (ref 8.7–10.2)
Chloride: 99 mmol/L (ref 96–106)
Creatinine, Ser: 0.86 mg/dL (ref 0.57–1.00)
Globulin, Total: 2.3 g/dL (ref 1.5–4.5)
Glucose: 251 mg/dL — ABNORMAL HIGH (ref 70–99)
Potassium: 4.8 mmol/L (ref 3.5–5.2)
Sodium: 137 mmol/L (ref 134–144)
Total Protein: 6.4 g/dL (ref 6.0–8.5)
eGFR: 83 mL/min/{1.73_m2} (ref >59)

## 2022-03-22 LAB — LIPID PANEL
Chol/HDL Ratio: 4.8 ratio — ABNORMAL HIGH (ref 0.0–4.4)
Cholesterol, Total: 188 mg/dL (ref 100–199)
HDL: 39 mg/dL — ABNORMAL LOW (ref >39)
LDL Chol Calc (NIH): 116 mg/dL — ABNORMAL HIGH (ref 0–99)
Triglycerides: 185 mg/dL — ABNORMAL HIGH (ref 0–149)
VLDL Cholesterol Cal: 33 mg/dL (ref 5–40)

## 2022-03-22 LAB — VITAMIN B12: Vitamin B-12: 180 pg/mL — ABNORMAL LOW (ref 232–1245)

## 2022-03-22 LAB — VITAMIN D 25 HYDROXY (VIT D DEFICIENCY, FRACTURES): Vit D, 25-Hydroxy: 15.6 ng/mL — ABNORMAL LOW (ref 30.0–100.0)

## 2022-03-22 MED ORDER — VITAMIN D (ERGOCALCIFEROL) 1.25 MG (50000 UNIT) PO CAPS: 50000.0000 [IU] | ORAL_CAPSULE | ORAL | 1 refills | Status: DC

## 2022-03-27 ENCOUNTER — Telehealth: Payer: Self-pay | Admitting: Family Medicine

## 2022-03-27 DIAGNOSIS — I10 Essential (primary) hypertension: Secondary | ICD-10-CM

## 2022-03-27 DIAGNOSIS — R601 Generalized edema: Secondary | ICD-10-CM

## 2022-03-28 MED ORDER — FUROSEMIDE 40 MG PO TABS: 40.0000 mg | ORAL_TABLET | Freq: Two times a day (BID) | ORAL | 2 refills | Status: DC

## 2022-03-28 NOTE — Telephone Encounter (Signed)
Pt called to let PCP know that [facility] was supposed to send [place] a refill request to fill her FUROSEMIDE 40 MG Rx. No refills were sent in when she was seen recently.  Please advise.

## 2022-03-28 NOTE — Telephone Encounter (Signed)
Rx sent left detailed message 

## 2022-03-28 NOTE — Addendum Note (Signed)
Addended by: Nigel Berthold C on: 03/28/2022 02:15 PM   Modules accepted: Orders

## 2022-04-24 ENCOUNTER — Other Ambulatory Visit: Payer: Self-pay | Admitting: Family Medicine

## 2022-04-24 ENCOUNTER — Other Ambulatory Visit: Payer: Self-pay | Admitting: *Deleted

## 2022-04-24 ENCOUNTER — Telehealth: Payer: Self-pay | Admitting: Obstetrics & Gynecology

## 2022-04-24 DIAGNOSIS — I10 Essential (primary) hypertension: Secondary | ICD-10-CM

## 2022-04-24 NOTE — Telephone Encounter (Signed)
Pt is requesting valACYclovir (VALTREX) 500 MG tablet [[id number]]  to be sent to [facility]

## 2022-04-25 MED ORDER — VALACYCLOVIR HCL 500 MG PO TABS: 500.0000 mg | ORAL_TABLET | Freq: Every day | ORAL | 0 refills | Status: AC

## 2022-04-25 NOTE — Telephone Encounter (Signed)
Rx sent for valtrex 

## 2022-05-12 DIAGNOSIS — Z794 Long term (current) use of insulin: Secondary | ICD-10-CM | POA: Diagnosis not present

## 2022-05-12 DIAGNOSIS — Z880 Allergy status to penicillin: Secondary | ICD-10-CM | POA: Diagnosis not present

## 2022-05-12 DIAGNOSIS — E119 Type 2 diabetes mellitus without complications: Secondary | ICD-10-CM | POA: Diagnosis not present

## 2022-05-12 DIAGNOSIS — Z79899 Other long term (current) drug therapy: Secondary | ICD-10-CM | POA: Diagnosis not present

## 2022-05-12 DIAGNOSIS — F32A Depression, unspecified: Secondary | ICD-10-CM | POA: Diagnosis not present

## 2022-05-12 DIAGNOSIS — I10 Essential (primary) hypertension: Secondary | ICD-10-CM | POA: Diagnosis not present

## 2022-05-12 DIAGNOSIS — J45909 Unspecified asthma, uncomplicated: Secondary | ICD-10-CM | POA: Diagnosis not present

## 2022-05-12 DIAGNOSIS — Z888 Allergy status to other drugs, medicaments and biological substances status: Secondary | ICD-10-CM | POA: Diagnosis not present

## 2022-05-12 DIAGNOSIS — R059 Cough, unspecified: Secondary | ICD-10-CM | POA: Diagnosis not present

## 2022-05-12 DIAGNOSIS — F4310 Post-traumatic stress disorder, unspecified: Secondary | ICD-10-CM | POA: Diagnosis not present

## 2022-05-12 DIAGNOSIS — Z885 Allergy status to narcotic agent status: Secondary | ICD-10-CM | POA: Diagnosis not present

## 2022-05-12 DIAGNOSIS — Z20822 Contact with and (suspected) exposure to covid-19: Secondary | ICD-10-CM | POA: Diagnosis not present

## 2022-05-12 DIAGNOSIS — J069 Acute upper respiratory infection, unspecified: Secondary | ICD-10-CM | POA: Diagnosis not present

## 2022-05-14 ENCOUNTER — Telehealth: Payer: Self-pay | Admitting: Family Medicine

## 2022-05-14 DIAGNOSIS — E119 Type 2 diabetes mellitus without complications: Secondary | ICD-10-CM | POA: Diagnosis not present

## 2022-05-14 NOTE — Telephone Encounter (Signed)
Spoke with pt - was started on tessalon perles  - aware to drink plenty of water and call us if she starts feeling any worse ( will need appt if so)

## 2022-05-22 ENCOUNTER — Ambulatory Visit: Payer: BC Managed Care – PPO | Admitting: Nurse Practitioner

## 2022-05-22 ENCOUNTER — Encounter: Payer: Self-pay | Admitting: *Deleted

## 2022-05-22 DIAGNOSIS — E1169 Type 2 diabetes mellitus with other specified complication: Secondary | ICD-10-CM | POA: Diagnosis not present

## 2022-05-22 DIAGNOSIS — R918 Other nonspecific abnormal finding of lung field: Secondary | ICD-10-CM | POA: Diagnosis not present

## 2022-05-22 DIAGNOSIS — Z885 Allergy status to narcotic agent status: Secondary | ICD-10-CM | POA: Diagnosis not present

## 2022-05-22 DIAGNOSIS — Z7902 Long term (current) use of antithrombotics/antiplatelets: Secondary | ICD-10-CM | POA: Diagnosis not present

## 2022-05-22 DIAGNOSIS — Z20822 Contact with and (suspected) exposure to covid-19: Secondary | ICD-10-CM | POA: Diagnosis not present

## 2022-05-22 DIAGNOSIS — J189 Pneumonia, unspecified organism: Secondary | ICD-10-CM | POA: Diagnosis not present

## 2022-05-22 DIAGNOSIS — I5030 Unspecified diastolic (congestive) heart failure: Secondary | ICD-10-CM | POA: Diagnosis not present

## 2022-05-22 DIAGNOSIS — J9601 Acute respiratory failure with hypoxia: Secondary | ICD-10-CM | POA: Diagnosis not present

## 2022-05-22 DIAGNOSIS — I5031 Acute diastolic (congestive) heart failure: Secondary | ICD-10-CM | POA: Diagnosis not present

## 2022-05-22 DIAGNOSIS — F4310 Post-traumatic stress disorder, unspecified: Secondary | ICD-10-CM | POA: Diagnosis not present

## 2022-05-22 DIAGNOSIS — Z6841 Body Mass Index (BMI) 40.0 and over, adult: Secondary | ICD-10-CM | POA: Diagnosis not present

## 2022-05-22 DIAGNOSIS — R0902 Hypoxemia: Secondary | ICD-10-CM | POA: Diagnosis not present

## 2022-05-22 DIAGNOSIS — E1142 Type 2 diabetes mellitus with diabetic polyneuropathy: Secondary | ICD-10-CM | POA: Diagnosis not present

## 2022-05-22 DIAGNOSIS — I110 Hypertensive heart disease with heart failure: Secondary | ICD-10-CM | POA: Diagnosis not present

## 2022-05-22 DIAGNOSIS — E1140 Type 2 diabetes mellitus with diabetic neuropathy, unspecified: Secondary | ICD-10-CM | POA: Diagnosis not present

## 2022-05-22 DIAGNOSIS — G4733 Obstructive sleep apnea (adult) (pediatric): Secondary | ICD-10-CM | POA: Diagnosis not present

## 2022-05-22 DIAGNOSIS — E785 Hyperlipidemia, unspecified: Secondary | ICD-10-CM | POA: Diagnosis not present

## 2022-05-22 DIAGNOSIS — Z9641 Presence of insulin pump (external) (internal): Secondary | ICD-10-CM | POA: Diagnosis not present

## 2022-05-22 DIAGNOSIS — R0602 Shortness of breath: Secondary | ICD-10-CM | POA: Diagnosis not present

## 2022-05-22 DIAGNOSIS — J811 Chronic pulmonary edema: Secondary | ICD-10-CM | POA: Diagnosis not present

## 2022-05-22 DIAGNOSIS — I509 Heart failure, unspecified: Secondary | ICD-10-CM | POA: Diagnosis not present

## 2022-05-22 DIAGNOSIS — Z888 Allergy status to other drugs, medicaments and biological substances status: Secondary | ICD-10-CM | POA: Diagnosis not present

## 2022-05-22 DIAGNOSIS — R069 Unspecified abnormalities of breathing: Secondary | ICD-10-CM | POA: Diagnosis not present

## 2022-05-22 DIAGNOSIS — R739 Hyperglycemia, unspecified: Secondary | ICD-10-CM | POA: Diagnosis not present

## 2022-05-22 DIAGNOSIS — R059 Cough, unspecified: Secondary | ICD-10-CM | POA: Diagnosis not present

## 2022-05-22 DIAGNOSIS — Z7984 Long term (current) use of oral hypoglycemic drugs: Secondary | ICD-10-CM | POA: Diagnosis not present

## 2022-05-23 ENCOUNTER — Ambulatory Visit: Payer: BC Managed Care – PPO | Admitting: Neurology

## 2022-06-01 ENCOUNTER — Encounter: Payer: Self-pay | Admitting: Nurse Practitioner

## 2022-06-01 ENCOUNTER — Ambulatory Visit (INDEPENDENT_AMBULATORY_CARE_PROVIDER_SITE_OTHER): Payer: BC Managed Care – PPO | Admitting: Nurse Practitioner

## 2022-06-01 VITALS — BP 138/88 | HR 89 | Temp 98.6°F | Ht 66.0 in | Wt 352.0 lb

## 2022-06-01 DIAGNOSIS — R051 Acute cough: Secondary | ICD-10-CM

## 2022-06-01 DIAGNOSIS — N39498 Other specified urinary incontinence: Secondary | ICD-10-CM

## 2022-06-01 DIAGNOSIS — K921 Melena: Secondary | ICD-10-CM

## 2022-06-01 MED ORDER — BENZONATATE 100 MG PO CAPS: 100.0000 mg | ORAL_CAPSULE | Freq: Three times a day (TID) | ORAL | 0 refills | Status: DC | PRN

## 2022-06-01 MED ORDER — GUAIFENESIN ER 600 MG PO TB12: 600.0000 mg | ORAL_TABLET | Freq: Two times a day (BID) | ORAL | 0 refills | Status: DC

## 2022-06-01 MED ORDER — OXYBUTYNIN CHLORIDE ER 10 MG PO TB24: 10.0000 mg | ORAL_TABLET | Freq: Every day | ORAL | 1 refills | Status: DC

## 2022-06-01 NOTE — Patient Instructions (Addendum)
Rectal Bleeding  Rectal bleeding is when blood passes out of the opening between the buttocks (anus). People with rectal bleeding may notice bright red blood in their underwear or in the toilet after having a bowel movement. They may also have blood mixed with their stool (feces), or dark red or black stools. Rectal bleeding is usually a sign that something is wrong. Many things can cause rectal bleeding, including: Diverticulosis. This is a condition in which pockets or sacs project from the bowel. Hemorrhoids. These are blood vessels around the anus or inside the rectum that are larger than normal. Anal fissures. This is a tear in the anus. Proctitis and colitis. These are conditions in which the rectum, colon, or anus become inflamed. Polyps. These are growths that can be cancerous (malignant) or noncancerous (benign). Infections of the intestines. Fistulas. These are abnormal openings in the rectum and anus. Rectal prolapse. This is when a part of the rectum sticks out from the anus. Follow these instructions at home: Pay attention to any changes in your symptoms. Take these actions to help reduce bleeding and discomfort: Medicines Take over-the-counter and prescription medicines only as told by your health care provider. Ask your health care provider about changing or stopping your regular medicines or supplements. This is especially important if you are taking blood thinners. Medicines that thin the blood can make rectal bleeding worse. Managing constipation Your condition may cause constipation. To prevent or treat constipation, or to help make your stools soft, you may need to: Drink enough fluid to keep your urine pale yellow. Take over-the-counter or prescription medicines. Eat foods that are high in fiber, such as beans, whole grains, and fresh fruits and vegetables. Ask your health care provider if you need a supplement to give you more fiber. Limit foods that are high in fat and  processed sugars, such as fried or sweet foods.  General instructions Try not to strain when having a bowel movement. Try taking a warm bath. This may help to soothe any pain in your rectum. Keep all follow-up visits as told by your health care provider. This is important. Contact a health care provider if you: Have pain or tenderness in your abdomen. Have a fever. Have weakness. Have nausea. Cannot have a bowel movement. Get help right away if you have: New or increased rectal bleeding. Black or dark red stools. Vomit with blood or something that looks like coffee grounds. A fainting episode. Severe pain in your rectum. Summary Rectal bleeding is usually a sign that something is wrong. This condition should be evaluated by a health care provider. Eat a diet that is high in fiber. This will help keep your stools soft, making it easier to pass stools without straining. Medicines that thin the blood can make rectal bleeding worse. Get help right away if you have new or increased rectal bleeding, black or dark red stools, blood in your vomit, an episode of fainting, or severe pain in your rectum. This information is not intended to replace advice given to you by your health care provider. Make sure you discuss any questions you have with your health care provider. Document Revised: 07/08/2019 Document Reviewed: 07/08/2019 Elsevier Patient Education  [place]. Urinary Incontinence Urinary incontinence refers to a condition in which a person is unable to control where and when to pass urine. A person with this condition will urinate involuntarily. This means that the person urinates when he or she does not mean to. What are the causes?  This condition may be caused by: Medicines. Infections. Constipation. Overactive bladder muscles. Weak bladder muscles. Weak pelvic floor muscles. These muscles provide support for the bladder, intestine, and, in women, the uterus. Enlarged  prostate in men. The prostate is a gland near the bladder. When it gets too big, it can pinch the urethra. With the urethra blocked, the bladder can weaken and lose the ability to empty properly. Surgery. Emotional factors, such as anxiety, stress, or post-traumatic stress disorder (PTSD). Spinal cord injury, nerve injury, or other neurological conditions. Pelvic organ prolapse. This happens in women when organs move out of place and into the vagina. This movement can prevent the bladder and urethra from working properly. What increases the risk? The following factors may make you more likely to develop this condition: Age. The older you are, the higher the risk. Obesity. Being physically inactive. Pregnancy and childbirth. Menopause. Diseases that affect the nerves or spinal cord. Long-term, or chronic, coughing. This can increase pressure on the bladder and pelvic floor muscles. What are the signs or symptoms? Symptoms may vary depending on the type of urinary incontinence you have. They include: A sudden urge to urinate, and passing urine involuntarily before you can get to a bathroom (urge incontinence). Suddenly passing urine when doing activities that force urine to pass, such as coughing, laughing, exercising, or sneezing (stress incontinence). Needing to urinate often but urinating only a small amount, or constantly dribbling urine (overflow incontinence). Urinating because you cannot get to the bathroom in time due to a physical disability, such as arthritis or injury, or due to a communication or thinking problem, such as Alzheimer's disease (functional incontinence). How is this diagnosed? This condition may be diagnosed based on: Your medical history. A physical exam. Tests, such as: Urine tests. X-rays of your kidney and bladder. Ultrasound. CT scan. Cystoscopy. In this procedure, a health care provider inserts a tube with a light and camera (cystoscope) through the urethra  and into the bladder to check for problems. Urodynamic testing. These tests assess how well the bladder, urethra, and sphincter can store and release urine. There are different types of urodynamic tests, and they vary depending on what the test is measuring. To help diagnose your condition, your health care provider may recommend that you keep a log of when you urinate and how much you urinate. How is this treated? Treatment for this condition depends on the type of incontinence that you have and its cause. Treatment may include: Lifestyle changes, such as: Quitting smoking. Maintaining a healthy weight. Staying active. Try to get 150 minutes of moderate-intensity exercise every week. Ask your health care provider which activities are safe for you. Eating a healthy diet. Avoid high-fat foods, like fried foods. Avoid refined carbohydrates like white bread and white rice. Limit how much alcohol and caffeine you drink. Increase your fiber intake. Healthy sources of fiber include beans, whole grains, and fresh fruits and vegetables. Behavioral changes, such as: Pelvic floor muscle exercises. Bladder training, such as lengthening the amount of time between bathroom breaks, or using the bathroom at regular intervals. Using techniques to suppress bladder urges. This can include distraction techniques or controlled breathing exercises. Medicines, such as: Medicines to relax the bladder muscles and prevent bladder spasms. Medicines to help slow or prevent the growth of a man's prostate. Botox injections. These can help relax the bladder muscles. Treatments, such as: Using pulses of electricity to help change bladder reflexes (electrical nerve stimulation). For women, using a medical device to  prevent urine leaks. This is a small, tampon-like, disposable device that is inserted into the urethra. Injecting collagen or carbon beads (bulking agents) into the urinary sphincter. These can help thicken  tissue and close the bladder opening. Surgery. Follow these instructions at home: Lifestyle Limit alcohol and caffeine. These can fill your bladder quickly and irritate it. Keep yourself clean to help prevent odors and skin damage. Ask your health care provider about special skin creams and cleansers that can protect the skin from urine. Consider wearing pads or adult diapers. Make sure to change them regularly, and always change them right after experiencing incontinence. General instructions Take over-the-counter and prescription medicines only as told by your health care provider. Use the bathroom about every 3-4 hours, even if you do not feel the need to urinate. Try to empty your bladder completely every time. After urinating, wait a minute. Then try to urinate again. Make sure you are in a relaxed position while urinating. If your incontinence is caused by nerve problems, keep a log of the medicines you take and the times you go to the bathroom. Keep all follow-up visits. This is important. Where to find more information [employer] Diabetes and Digestive and Kidney Diseases: [employer] U[employer] Association: www.urologyhealth.org Contact a health care provider if: You have pain that gets worse. Your incontinence gets worse. Get help right away if: You have a fever or chills. You are unable to urinate. You have redness in your groin area or down your legs. Summary Urinary incontinence refers to a condition in which a person is unable to control where and when to pass urine. This condition may be caused by medicines, infection, weak bladder muscles, weak pelvic floor muscles, enlargement of the prostate (in men), or surgery. Factors such as older age, obesity, pregnancy and childbirth, menopause, neurological diseases, and chronic coughing may increase your risk for developing this condition. Types of urinary incontinence include urge incontinence, stress  incontinence, overflow incontinence, and functional incontinence. This condition is usually treated first with lifestyle and behavioral changes, such as quitting smoking, eating a healthier diet, and doing regular pelvic floor exercises. Other treatment options include medicines, bulking agents, medical devices, electrical nerve stimulation, or surgery. This information is not intended to replace advice given to you by your health care provider. Make sure you discuss any questions you have with your health care provider. Document Revised: 03/11/2020 Document Reviewed: 03/11/2020 Elsevier Patient Education  [place].

## 2022-06-01 NOTE — Progress Notes (Signed)
Established Patient Office Visit  Subjective   Patient ID: Kathryn Jimenez, female    DOB: [birth year]  Age: 51 y.o. MRN: [id number]  Chief Complaint  Patient presents with  . Hospitalization Follow-up    HPI  {History (Optional):23778}  Review of Systems  Constitutional: Negative.   HENT: Negative.    Eyes: Negative.   Respiratory:  Positive for shortness of breath.        Patient is on continue oxygen at 2l  Cardiovascular: Negative.   Gastrointestinal:  Positive for blood in stool.  Genitourinary: Negative.   Skin: Negative.  Negative for itching and rash.  All other systems reviewed and are negative.     Objective:     BP 138/88   Pulse 89   Temp 98.6 F (37 C)   Ht 5' 6"  (1.676 m)   Wt (!) 352 lb (159.7 kg)   SpO2 94%   BMI 56.81 kg/m  BP Readings from Last 3 Encounters:  06/01/22 138/88  03/21/22 138/72  01/03/22 133/74      Physical Exam Vitals and nursing note reviewed.  Constitutional:      Appearance: She is obese.  HENT:     Head: Normocephalic.     Right Ear: External ear normal.     Left Ear: External ear normal.     Nose: Nose normal.     Mouth/Throat:     Mouth: Mucous membranes are moist.     Pharynx: Oropharynx is clear.  Eyes:     Pupils: Pupils are equal, round, and reactive to light.  Cardiovascular:     Rate and Rhythm: Normal rate.     Pulses: Normal pulses.     Heart sounds: Normal heart sounds.  Pulmonary:     Effort: Pulmonary effort is normal.     Breath sounds: Normal breath sounds. No wheezing.     Comments: Decreased bilateral lower lung sound Abdominal:     General: Bowel sounds are normal.  Skin:    Findings: No erythema or rash.  Neurological:     General: No focal deficit present.     Mental Status: She is alert and oriented to person, place, and time.     No results found for any visits on 06/01/22.  Last CBC Lab Results  Component Value Date   WBC 7.3 11/16/2021   HGB 13.7 11/16/2021   HCT 41.5  11/16/2021   MCV 87 11/16/2021   MCH 28.6 11/16/2021   RDW 13.4 11/16/2021   PLT 349 55/73/2202   Last metabolic panel Lab Results  Component Value Date   GLUCOSE 251 (H) 03/21/2022   NA 137 03/21/2022   K 4.8 03/21/2022   CL 99 03/21/2022   CO2 24 03/21/2022   BUN 10 03/21/2022   CREATININE 0.86 03/21/2022   EGFR 83 03/21/2022   CALCIUM 9.6 03/21/2022   PROT 6.4 03/21/2022   ALBUMIN 4.1 03/21/2022   LABGLOB 2.3 03/21/2022   AGRATIO 1.8 03/21/2022   BILITOT 0.3 03/21/2022   ALKPHOS 103 03/21/2022   AST 13 03/21/2022   ALT 15 03/21/2022   ANIONGAP 9 04/18/2021   Last lipids Lab Results  Component Value Date   CHOL 188 03/21/2022   HDL 39 (L) 03/21/2022   LDLCALC 116 (H) 03/21/2022   TRIG 185 (H) 03/21/2022   CHOLHDL 4.8 (H) 03/21/2022   Last hemoglobin A1c Lab Results  Component Value Date   HGBA1C 8.6 (H) 04/18/2021      The ASCVD  Risk score (Arnett DK, et al., 2019) failed to calculate for the following reasons:   The valid total cholesterol range is 130 to 320 mg/dL    Assessment & Plan:   Problem List Items Addressed This Visit       Other   Urinary incontinence - Primary   Relevant Medications   oxybutynin (DITROPAN XL) 10 MG 24 hr tablet   Other Visit Diagnoses     Hematochezia       Acute cough       Relevant Medications   benzonatate (TESSALON PERLES) 100 MG capsule   guaiFENesin (MUCINEX) 600 MG 12 hr tablet   Other Relevant Orders   DG Chest 2 View       Return in about 6 weeks (around 07/13/2022) for Repeat chest x-ray to rule out Pnuemonia.    Ivy Lynn, NP

## 2022-06-02 NOTE — Assessment & Plan Note (Signed)
Oxybutynin 10 mg tablet every 24 hours for urinary inconstance, follow up with unresolved symptoms.

## 2022-06-04 ENCOUNTER — Other Ambulatory Visit: Payer: Self-pay | Admitting: Nurse Practitioner

## 2022-06-04 ENCOUNTER — Telehealth: Payer: Self-pay | Admitting: Nurse Practitioner

## 2022-06-04 ENCOUNTER — Other Ambulatory Visit: Payer: Self-pay

## 2022-06-04 DIAGNOSIS — B3731 Acute candidiasis of vulva and vagina: Secondary | ICD-10-CM

## 2022-06-04 MED ORDER — FLUCONAZOLE 150 MG PO TABS: 150.0000 mg | ORAL_TABLET | Freq: Once | ORAL | 0 refills | Status: AC

## 2022-06-04 NOTE — Telephone Encounter (Signed)
Pt has been notified.

## 2022-06-04 NOTE — Telephone Encounter (Signed)
Pt called stating that the hospital gave her an antibiotic to start taking for her pneumonia and says shes been taking it but it has caused her to get a yeast infection. Wants to know if PCP will prescribe her Diflucan for the yeast infection. Says its what she normally takes when this happens.  Please advise and call patient.

## 2022-06-04 NOTE — Telephone Encounter (Signed)
Diflucan sent to pharmacy. Follow up with unresolved symptoms

## 2022-06-05 DIAGNOSIS — E119 Type 2 diabetes mellitus without complications: Secondary | ICD-10-CM | POA: Diagnosis not present

## 2022-06-05 MED ORDER — VALACYCLOVIR HCL 500 MG PO TABS: 500.0000 mg | ORAL_TABLET | Freq: Every day | ORAL | 0 refills | Status: DC

## 2022-06-15 ENCOUNTER — Encounter (HOSPITAL_COMMUNITY): Payer: Self-pay

## 2022-06-15 DIAGNOSIS — Z6841 Body Mass Index (BMI) 40.0 and over, adult: Secondary | ICD-10-CM | POA: Diagnosis not present

## 2022-06-15 DIAGNOSIS — I110 Hypertensive heart disease with heart failure: Secondary | ICD-10-CM | POA: Diagnosis not present

## 2022-06-15 DIAGNOSIS — R7989 Other specified abnormal findings of blood chemistry: Secondary | ICD-10-CM | POA: Diagnosis not present

## 2022-06-15 DIAGNOSIS — J9601 Acute respiratory failure with hypoxia: Secondary | ICD-10-CM | POA: Diagnosis not present

## 2022-06-15 DIAGNOSIS — Z7902 Long term (current) use of antithrombotics/antiplatelets: Secondary | ICD-10-CM | POA: Diagnosis not present

## 2022-06-15 DIAGNOSIS — I5023 Acute on chronic systolic (congestive) heart failure: Secondary | ICD-10-CM | POA: Diagnosis not present

## 2022-06-15 DIAGNOSIS — Z885 Allergy status to narcotic agent status: Secondary | ICD-10-CM | POA: Diagnosis not present

## 2022-06-15 DIAGNOSIS — G4733 Obstructive sleep apnea (adult) (pediatric): Secondary | ICD-10-CM | POA: Diagnosis not present

## 2022-06-15 DIAGNOSIS — Z9641 Presence of insulin pump (external) (internal): Secondary | ICD-10-CM | POA: Diagnosis not present

## 2022-06-15 DIAGNOSIS — I2510 Atherosclerotic heart disease of native coronary artery without angina pectoris: Secondary | ICD-10-CM | POA: Diagnosis not present

## 2022-06-15 DIAGNOSIS — R0689 Other abnormalities of breathing: Secondary | ICD-10-CM | POA: Diagnosis not present

## 2022-06-15 DIAGNOSIS — R778 Other specified abnormalities of plasma proteins: Secondary | ICD-10-CM | POA: Diagnosis not present

## 2022-06-15 DIAGNOSIS — I5033 Acute on chronic diastolic (congestive) heart failure: Secondary | ICD-10-CM | POA: Diagnosis not present

## 2022-06-15 DIAGNOSIS — I10 Essential (primary) hypertension: Secondary | ICD-10-CM | POA: Diagnosis not present

## 2022-06-15 DIAGNOSIS — Z8673 Personal history of transient ischemic attack (TIA), and cerebral infarction without residual deficits: Secondary | ICD-10-CM | POA: Diagnosis not present

## 2022-06-15 DIAGNOSIS — I2489 Other forms of acute ischemic heart disease: Secondary | ICD-10-CM | POA: Diagnosis not present

## 2022-06-15 DIAGNOSIS — I5032 Chronic diastolic (congestive) heart failure: Secondary | ICD-10-CM | POA: Diagnosis not present

## 2022-06-15 DIAGNOSIS — I471 Supraventricular tachycardia, unspecified: Secondary | ICD-10-CM | POA: Diagnosis not present

## 2022-06-15 DIAGNOSIS — E119 Type 2 diabetes mellitus without complications: Secondary | ICD-10-CM | POA: Diagnosis not present

## 2022-06-15 DIAGNOSIS — I429 Cardiomyopathy, unspecified: Secondary | ICD-10-CM | POA: Diagnosis not present

## 2022-06-15 DIAGNOSIS — Z7984 Long term (current) use of oral hypoglycemic drugs: Secondary | ICD-10-CM | POA: Diagnosis not present

## 2022-06-15 DIAGNOSIS — Z79899 Other long term (current) drug therapy: Secondary | ICD-10-CM | POA: Diagnosis not present

## 2022-06-15 DIAGNOSIS — I272 Pulmonary hypertension, unspecified: Secondary | ICD-10-CM | POA: Diagnosis not present

## 2022-06-15 DIAGNOSIS — R079 Chest pain, unspecified: Secondary | ICD-10-CM | POA: Diagnosis not present

## 2022-06-15 DIAGNOSIS — R0600 Dyspnea, unspecified: Secondary | ICD-10-CM | POA: Diagnosis not present

## 2022-06-15 DIAGNOSIS — F603 Borderline personality disorder: Secondary | ICD-10-CM | POA: Diagnosis not present

## 2022-06-15 DIAGNOSIS — Z20822 Contact with and (suspected) exposure to covid-19: Secondary | ICD-10-CM | POA: Diagnosis not present

## 2022-06-15 DIAGNOSIS — E1169 Type 2 diabetes mellitus with other specified complication: Secondary | ICD-10-CM | POA: Diagnosis not present

## 2022-06-15 DIAGNOSIS — Z880 Allergy status to penicillin: Secondary | ICD-10-CM | POA: Diagnosis not present

## 2022-06-15 DIAGNOSIS — Z7901 Long term (current) use of anticoagulants: Secondary | ICD-10-CM | POA: Diagnosis not present

## 2022-06-15 DIAGNOSIS — E785 Hyperlipidemia, unspecified: Secondary | ICD-10-CM | POA: Diagnosis not present

## 2022-06-18 ENCOUNTER — Observation Stay (HOSPITAL_COMMUNITY)
Admission: EM | Admit: 2022-06-18 | Discharge: 2022-06-20 | Disposition: A | Discharge status: Home / Self Care | Payer: BC Managed Care – PPO | Source: Other Acute Inpatient Hospital | Attending: Family Medicine | Admitting: Family Medicine

## 2022-06-18 ENCOUNTER — Ambulatory Visit: Payer: BC Managed Care – PPO | Admitting: Obstetrics & Gynecology

## 2022-06-18 DIAGNOSIS — G4733 Obstructive sleep apnea (adult) (pediatric): Secondary | ICD-10-CM | POA: Diagnosis not present

## 2022-06-18 DIAGNOSIS — Z794 Long term (current) use of insulin: Secondary | ICD-10-CM

## 2022-06-18 DIAGNOSIS — E1142 Type 2 diabetes mellitus with diabetic polyneuropathy: Secondary | ICD-10-CM

## 2022-06-18 DIAGNOSIS — I2510 Atherosclerotic heart disease of native coronary artery without angina pectoris: Secondary | ICD-10-CM | POA: Diagnosis not present

## 2022-06-18 DIAGNOSIS — Z8673 Personal history of transient ischemic attack (TIA), and cerebral infarction without residual deficits: Secondary | ICD-10-CM | POA: Insufficient documentation

## 2022-06-18 DIAGNOSIS — R7989 Other specified abnormal findings of blood chemistry: Secondary | ICD-10-CM | POA: Diagnosis not present

## 2022-06-18 DIAGNOSIS — E785 Hyperlipidemia, unspecified: Secondary | ICD-10-CM | POA: Insufficient documentation

## 2022-06-18 DIAGNOSIS — I110 Hypertensive heart disease with heart failure: Principal | ICD-10-CM | POA: Insufficient documentation

## 2022-06-18 DIAGNOSIS — I5032 Chronic diastolic (congestive) heart failure: Secondary | ICD-10-CM | POA: Diagnosis present

## 2022-06-18 DIAGNOSIS — F603 Borderline personality disorder: Secondary | ICD-10-CM | POA: Insufficient documentation

## 2022-06-18 DIAGNOSIS — I272 Pulmonary hypertension, unspecified: Secondary | ICD-10-CM | POA: Insufficient documentation

## 2022-06-18 DIAGNOSIS — I10 Essential (primary) hypertension: Secondary | ICD-10-CM | POA: Diagnosis present

## 2022-06-18 DIAGNOSIS — I5033 Acute on chronic diastolic (congestive) heart failure: Secondary | ICD-10-CM | POA: Diagnosis not present

## 2022-06-18 DIAGNOSIS — Z7902 Long term (current) use of antithrombotics/antiplatelets: Secondary | ICD-10-CM | POA: Diagnosis not present

## 2022-06-18 DIAGNOSIS — Z6841 Body Mass Index (BMI) 40.0 and over, adult: Secondary | ICD-10-CM | POA: Diagnosis not present

## 2022-06-18 DIAGNOSIS — I2489 Other forms of acute ischemic heart disease: Secondary | ICD-10-CM | POA: Insufficient documentation

## 2022-06-18 DIAGNOSIS — Z79899 Other long term (current) drug therapy: Secondary | ICD-10-CM | POA: Diagnosis not present

## 2022-06-18 DIAGNOSIS — R601 Generalized edema: Secondary | ICD-10-CM

## 2022-06-18 DIAGNOSIS — E119 Type 2 diabetes mellitus without complications: Secondary | ICD-10-CM | POA: Insufficient documentation

## 2022-06-18 DIAGNOSIS — I471 Supraventricular tachycardia, unspecified: Secondary | ICD-10-CM | POA: Diagnosis present

## 2022-06-18 DIAGNOSIS — E7800 Pure hypercholesterolemia, unspecified: Principal | ICD-10-CM

## 2022-06-18 LAB — HEMOGLOBIN A1C
Hgb A1c MFr Bld: 6.9 % — ABNORMAL HIGH (ref 4.8–5.6)
Mean Plasma Glucose: 151.33 mg/dL

## 2022-06-18 LAB — HIV ANTIBODY (ROUTINE TESTING W REFLEX): HIV Screen 4th Generation wRfx: NONREACTIVE

## 2022-06-18 MED ORDER — INSULIN ASPART 100 UNIT/ML IJ SOLN
0.0000 [IU] | INTRAMUSCULAR | Status: DC
  Administered 2022-06-19: 3 [IU] via SUBCUTANEOUS
  Administered 2022-06-19: 8 [IU] via SUBCUTANEOUS
  Administered 2022-06-19 (×2): 2 [IU] via SUBCUTANEOUS
  Administered 2022-06-19: 3 [IU] via SUBCUTANEOUS
  Administered 2022-06-20: 8 [IU] via SUBCUTANEOUS
  Administered 2022-06-20 (×3): 3 [IU] via SUBCUTANEOUS

## 2022-06-18 MED ORDER — FLUOXETINE HCL 20 MG PO CAPS
40.0000 mg | ORAL_CAPSULE | Freq: Every day | ORAL | Status: DC
  Administered 2022-06-19 – 2022-06-20 (×2): 40 mg via ORAL
  Filled 2022-06-18 (×2): qty 2

## 2022-06-18 MED ORDER — ACETAMINOPHEN 325 MG PO TABS: 650.0000 mg | ORAL_TABLET | ORAL | Status: DC | PRN

## 2022-06-18 MED ORDER — ENOXAPARIN SODIUM 40 MG/0.4ML IJ SOSY
40.0000 mg | PREFILLED_SYRINGE | INTRAMUSCULAR | Status: DC
  Administered 2022-06-19 – 2022-06-20 (×2): 40 mg via SUBCUTANEOUS
  Filled 2022-06-18 (×2): qty 0.4

## 2022-06-18 MED ORDER — FUROSEMIDE 40 MG PO TABS
40.0000 mg | ORAL_TABLET | Freq: Every morning | ORAL | Status: DC
  Administered 2022-06-19 – 2022-06-20 (×2): 40 mg via ORAL
  Filled 2022-06-18 (×2): qty 1

## 2022-06-18 MED ORDER — LORAZEPAM 0.5 MG PO TABS: 0.2500 mg | ORAL_TABLET | Freq: Every day | ORAL | Status: DC | PRN

## 2022-06-18 MED ORDER — CYCLOSPORINE 0.05 % OP EMUL
1.0000 [drp] | Freq: Two times a day (BID) | OPHTHALMIC | Status: DC
  Administered 2022-06-18 – 2022-06-20 (×4): 1 [drp] via OPHTHALMIC
  Filled 2022-06-18 (×5): qty 30

## 2022-06-18 MED ORDER — LOSARTAN POTASSIUM 50 MG PO TABS
50.0000 mg | ORAL_TABLET | Freq: Every day | ORAL | Status: DC
  Administered 2022-06-19 – 2022-06-20 (×2): 50 mg via ORAL
  Filled 2022-06-18 (×2): qty 1

## 2022-06-18 MED ORDER — CLOPIDOGREL BISULFATE 75 MG PO TABS
75.0000 mg | ORAL_TABLET | Freq: Every day | ORAL | Status: DC
  Administered 2022-06-19 – 2022-06-20 (×2): 75 mg via ORAL
  Filled 2022-06-18 (×2): qty 1

## 2022-06-18 MED ORDER — OXYBUTYNIN CHLORIDE ER 10 MG PO TB24
10.0000 mg | ORAL_TABLET | Freq: Every day | ORAL | Status: DC
  Administered 2022-06-18 – 2022-06-19 (×2): 10 mg via ORAL
  Filled 2022-06-18 (×3): qty 1

## 2022-06-18 MED ORDER — ONDANSETRON HCL 4 MG/2ML IJ SOLN: 4.0000 mg | Freq: Four times a day (QID) | INTRAMUSCULAR | Status: DC | PRN

## 2022-06-18 MED ORDER — METOPROLOL TARTRATE 25 MG PO TABS
25.0000 mg | ORAL_TABLET | Freq: Two times a day (BID) | ORAL | Status: DC
  Administered 2022-06-18 – 2022-06-20 (×4): 25 mg via ORAL
  Filled 2022-06-18 (×4): qty 1

## 2022-06-18 MED ORDER — PRAZOSIN HCL 1 MG PO CAPS
1.0000 mg | ORAL_CAPSULE | Freq: Every day | ORAL | Status: DC
  Administered 2022-06-18 – 2022-06-19 (×2): 1 mg via ORAL
  Filled 2022-06-18 (×3): qty 1

## 2022-06-18 MED ORDER — NYSTATIN 100000 UNIT/GM EX POWD
Freq: Three times a day (TID) | CUTANEOUS | Status: DC
  Filled 2022-06-18: qty 15

## 2022-06-18 MED ORDER — VALACYCLOVIR HCL 500 MG PO TABS
500.0000 mg | ORAL_TABLET | Freq: Every day | ORAL | Status: DC
  Administered 2022-06-19 – 2022-06-20 (×2): 500 mg via ORAL
  Filled 2022-06-18 (×2): qty 1

## 2022-06-18 MED ORDER — INSULIN GLARGINE-YFGN 100 UNIT/ML ~~LOC~~ SOLN
15.0000 [IU] | Freq: Every day | SUBCUTANEOUS | Status: DC
  Administered 2022-06-18 – 2022-06-19 (×2): 15 [IU] via SUBCUTANEOUS
  Filled 2022-06-18 (×3): qty 0.15

## 2022-06-18 MED ORDER — ALBUTEROL SULFATE HFA 108 (90 BASE) MCG/ACT IN AERS: 2.0000 | INHALATION_SPRAY | Freq: Four times a day (QID) | RESPIRATORY_TRACT | Status: DC | PRN

## 2022-06-18 MED ORDER — POTASSIUM CHLORIDE CRYS ER 10 MEQ PO TBCR
20.0000 meq | EXTENDED_RELEASE_TABLET | Freq: Every day | ORAL | Status: DC
  Administered 2022-06-19 – 2022-06-20 (×2): 20 meq via ORAL
  Filled 2022-06-18 (×2): qty 2

## 2022-06-18 MED ORDER — BUPROPION HCL ER (XL) 150 MG PO TB24
150.0000 mg | ORAL_TABLET | Freq: Every day | ORAL | Status: DC
  Administered 2022-06-19 – 2022-06-20 (×2): 150 mg via ORAL
  Filled 2022-06-18 (×2): qty 1

## 2022-06-18 MED ORDER — INSULIN PUMP
SUBCUTANEOUS | Status: DC
  Filled 2022-06-18: qty 1

## 2022-06-18 NOTE — Assessment & Plan Note (Addendum)
New Dx as of earlier this month. 2d Echo = normal EF and grade 2 DD. 1. Cont home diuretics and ARB 2. Going to DC jardiance as patient has h/o yeast infections to SGLT2 inhibitors (Invokana) previously, and has already seen her PCP for yeast infection this month since starting Jardiance.

## 2022-06-18 NOTE — Assessment & Plan Note (Signed)
Patient with SVT episode prompting EMS call, resolved with adenosine, none since that single episode 3 days ago. 1. Continue Lopressor '25mg'$  BID started at OSH 2. If she has recurrence in future, will need EP referral for ablation.

## 2022-06-18 NOTE — Assessment & Plan Note (Signed)
Continue home BP meds

## 2022-06-18 NOTE — Assessment & Plan Note (Addendum)
Strongly urged patient to talk to PCP and see if theres any way her insurance can make ozempic or mounjaro affordable. BMI 50 with multiple obesity related illnesses including DM2, HTN, etc.

## 2022-06-18 NOTE — Assessment & Plan Note (Signed)
Pt with demand ischemia from SVT causing Trop rise, peaked at just over 1200, has since been trending back down over past couple of days. CP only with SVT rate of ~200, CP resolved after adenosine converted her back to NSR. Cards also doubts type 1 MI. 1. CP obs pathway 2. Cards to see in consult 3. trops already trended for 3 days, not going to order any more unless she re develops symptoms 4. Tele monitor 5. No need for repeat 2d echo with one done just earlier this month per cards 6. Per cards: since shes already waited 3 days for transfer, might as well see if we can do stress test tomorrow, but strongly doubt she needs LHC 1. NPO after MN

## 2022-06-18 NOTE — H&P (Signed)
History and Physical    Patient: Kathryn Jimenez FXT:[id number] DOB: 10-01-1970 DOA: 06/18/2022 DOS: the patient was seen and examined on 06/18/2022 PCP: Ivy Lynn, NP  Patient coming from: Home  Chief Complaint:  Chief Complaint  Patient presents with   Acute Respitory Failure    HPI: Kathryn Jimenez is a 51 y.o. female with medical history significant of Morbid obesity with BMI 50, DM2, HTN, HLD (intolerant to statins).  Recent new diagnosis of HFpEF with grade 2 DD earlier this month.  Patient with onset of CP, SOB, palpitations on 10/27.  Called EMS.  EMS found pt to be in SVT with HR ~200.  Gave adenosine and successfully cardioverted her to NSR.  CP symptoms resolved and has been CP free ever since that time.  Pt brought in to ED at [facility], serial trops started trending up before peaking at 1200 and trending back down over last 3 days.  [facility] requested transfer 3 days ago due to positive trops and no cards at [employer].  They spoke to fellow on call over here who said to admit to medicine and cards would consult.  3 day delay in transfer due to lack of beds.  Pt now arrives 10/30.  Pt asymptomatic ever since initial event, trops have trended down.   Review of Systems: As mentioned in the history of present illness. All other systems reviewed and are negative. Past Medical History:  Diagnosis Date   Allergy    Anginal pain ([place])    Anxiety    Arthritis    Asthma    Bladder disorder, other    Borderline personality disorder ([place])    Carotid artery disease ([place]) 03/05/2017   Depression    Diabetes mellitus without complication (HCC)    GERD (gastroesophageal reflux disease)    Headache    Hyperlipidemia    Hypertension    Kidney stones    Mucous retention cyst of maxillary sinus 08/31/2021   Noted on head CT   Neuromuscular disorder (HCC)    Neuropathy   PTSD (post-traumatic stress disorder)    Shortness of breath    Sleep apnea    Stroke ([place]) 2018   Thyroid  disease    nodule   Past Surgical History:  Procedure Laterality Date   CARPAL TUNNEL RELEASE     left 2015   CERVICAL FUSION Bilateral 2003   C 5-6 with titanium screws   CHOLECYSTECTOMY     COLONOSCOPY WITH PROPOFOL N/A 06/24/2014   Procedure: COLONOSCOPY WITH PROPOFOL;  Surgeon: Rogene Houston, MD;  Location: [facility];  Service: Endoscopy;  Laterality: N/A;  cecum time in 0810    time out   0821    total time 11 minutes   DILITATION & CURRETTAGE/HYSTROSCOPY WITH HYDROTHERMAL ABLATION N/A 04/19/2021   Procedure: DILATATION & CURETTAGE/HYSTEROSCOPY WITH HYDROTHERMAL ABLATION;  Surgeon: Janyth Pupa, DO;  Location: [facility];  Service: Gynecology;  Laterality: N/A;   INTRAUTERINE DEVICE (IUD) INSERTION N/A 04/19/2021   Procedure: INTRAUTERINE DEVICE (IUD) INSERTION;  Surgeon: Janyth Pupa, DO;  Location: [facility];  Service: Gynecology;  Laterality: N/A;   POLYPECTOMY N/A 06/24/2014   Procedure: RECTAL POLYPECTOMY;  Surgeon: Rogene Houston, MD;  Location: [facility];  Service: Endoscopy;  Laterality: N/A;   [place]   WISDOM TOOTH EXTRACTION Bilateral 2001 and 1990s   top and bottom    Social History:  reports that she has never smoked. She has never used  smokeless tobacco. She reports that she does not drink alcohol and does not use drugs.  Allergies  Allergen Reactions   Actos [Pioglitazone] Swelling   Augmentin [Amoxicillin-Pot Clavulanate] Diarrhea, Nausea Only and Other (See Comments)    Caused severe diarrhea   Citrus Other (See Comments)    "Issues with my tongue"- the fleshy part of the fruit    Invokana [Canagliflozin] Other (See Comments)    Yeast infection   Lopid [Gemfibrozil] Other (See Comments)    Made the tongue burn   Metoprolol Other (See Comments)    Headaches    Naproxen Sodium Other (See Comments)    Patient states she "felt loopy"   Penicillins Diarrhea and Other (See Comments)    Violent diarrhea- must have Diflucan   Pineapple  Other (See Comments)    "Issues with my tongue"   Statins Other (See Comments)    Joint pain   Tape Itching and Other (See Comments)    Tape, EKG leads, and Band-Aids - Itching and redness   Amlodipine Cough   Clavulanic Acid Nausea Only   Hctz [Hydrochlorothiazide] Rash   Lisinopril Cough   Morphine And Related Itching and Rash    Family History  Problem Relation Age of Onset   COPD Mother    Arthritis Mother    Asthma Mother    Depression Mother    Diabetes Mother    Heart disease Mother    Hyperlipidemia Mother    Hypertension Mother    Mental illness Mother    Skin cancer Mother    Lung cancer Father    Arthritis Father    Depression Father    Drug abuse Father    Early death Father    Kidney disease Father    Mental illness Father    Colon cancer Sister    Birth defects Sister    COPD Sister    Hyperlipidemia Sister    Learning disabilities Sister    Breast cancer Sister    Breast cancer Sister    Colon cancer Sister    Alcohol abuse Paternal Grandmother    Diabetes Paternal Grandmother    Alcohol abuse Paternal Grandfather     Prior to Admission medications   Medication Sig Start Date End Date Taking? Authorizing Provider  acetaminophen (TYLENOL) 500 MG tablet Take 500 mg by mouth every 6 (six) hours as needed for mild pain or moderate pain.   Yes [provider]  albuterol (VENTOLIN HFA) 108 (90 Base) MCG/ACT inhaler 2 PUFFS INTO THE LUNGS EVERY 6 HOURS AS NEEDED FOR WHEEZING OR SHORTNESS OF BREATH. Patient taking differently: Inhale 2 puffs into the lungs every 6 (six) hours as needed for wheezing or shortness of breath. 04/18/21  Yes Hendricks Limes F, FNP  benzonatate (TESSALON PERLES) 100 MG capsule Take 1 capsule (100 mg total) by mouth 3 (three) times daily as needed. Patient taking differently: Take 100 mg by mouth 3 (three) times daily as needed for cough. 06/01/22  Yes Ivy Lynn, NP  buPROPion (WELLBUTRIN XL) 150 MG 24 hr tablet Take  150 mg by mouth daily. 11/27/16  Yes [provider]  clobetasol cream (TEMOVATE) 7.82 % Apply 1 application topically 2 (two) times daily as needed (scalp psoriasis). 06/13/21  Yes Hendricks Limes F, FNP  clopidogrel (PLAVIX) 75 MG tablet Take 1 tablet (75 mg total) by mouth daily. 03/21/22  Yes Hendricks Limes F, FNP  cycloSPORINE (RESTASIS) 0.05 % ophthalmic emulsion Place 1 drop into both eyes 2 (  two) times daily.   Yes [provider]  diphenhydrAMINE (BENADRYL) 25 mg capsule Take 25 mg by mouth every 6 (six) hours as needed for allergies.   Yes [provider]  FLUoxetine (PROZAC) 40 MG capsule Take 40 mg by mouth daily.   Yes [provider]  furosemide (LASIX) 40 MG tablet Take 1 tablet (40 mg total) by mouth 2 (two) times daily. Patient taking differently: Take 40 mg by mouth in the morning. 03/28/22  Yes Loman Brooklyn, FNP  guaiFENesin (MUCINEX) 600 MG 12 hr tablet Take 1 tablet (600 mg total) by mouth 2 (two) times daily. Patient taking differently: Take 600 mg by mouth 2 (two) times daily as needed for to loosen phlegm or cough. 06/01/22  Yes Ivy Lynn, NP  insulin aspart (NOVOLOG) 100 UNIT/ML injection Inject into the skin See admin instructions. Per pump/"2.9 units per hour"   Yes [provider]  Insulin Disposable Pump (OMNIPOD CLASSIC PODS, GEN 3,) MISC Inject 1 Device into the skin every other day. 01/06/20  Yes [provider]  JARDIANCE 10 MG TABS tablet Take 10 mg by mouth daily. 05/25/22  Yes [provider]  levonorgestrel (LILETTA, 52 MG,) 20.1 MCG/DAY IUD 1 each by Intrauterine route once. Inserted 04/19/21   Yes [provider]  LORazepam (ATIVAN) 0.5 MG tablet Take 0.25-0.5 mg by mouth daily as needed for anxiety.   Yes [provider]  losartan (COZAAR) 50 MG tablet Take 50 mg by mouth daily. 05/25/22  Yes [provider]  nystatin (MYCOSTATIN/NYSTOP) powder Apply 1 application   topically daily as needed (for fungal infections).   Yes [provider]  oxybutynin (DITROPAN XL) 10 MG 24 hr tablet Take 1 tablet (10 mg total) by mouth at bedtime. 06/01/22  Yes Ivy Lynn, NP  OXYGEN Inhale 4 L/min into the lungs continuous.   Yes [provider]  potassium chloride (KLOR-CON) 10 MEQ tablet Take 2 tablets (20 mEq total) by mouth daily. 03/21/22  Yes Hendricks Limes F, FNP  prazosin (MINIPRESS) 1 MG capsule Take 1 capsule (1 mg total) by mouth at bedtime. 12/19/21  Yes Hendricks Limes F, FNP  valACYclovir (VALTREX) 500 MG tablet Take 1 tablet (500 mg total) by mouth daily. 06/05/22  Yes Janyth Pupa, DO  Vitamin D, Ergocalciferol, (DRISDOL) 1.25 MG (50000 UNIT) CAPS capsule Take 1 capsule (50,000 Units total) by mouth 2 (two) times a week. Patient taking differently: Take 50,000 Units by mouth every 7 (seven) days. 03/22/22  Yes Hendricks Limes F, FNP  Incontinence Supply Disposable (PREVAIL IB FULL MAT BRIEF 2XL) MISC 1 each by Does not apply route every 2 (two) hours as needed. 10/04/20   Loman Brooklyn, FNP  OVER THE COUNTER MEDICATION Take 1-2 drops by mouth daily as needed (Thyroid Function). Potassium Iodide 4 %; Iodine 2 %; Distilled water Patient not taking: Reported on 06/18/2022    [provider]  valsartan (DIOVAN) 320 MG tablet TAKE 1 TABLET ONCE DAILY. Patient not taking: Reported on 06/18/2022 04/24/22   Loman Brooklyn, FNP    Physical Exam: Vitals:   06/18/22 2015  BP: (!) 145/66  Pulse: 83  Resp: 15  Temp: 99 F (37.2 C)  TempSrc: Oral  SpO2: 94%  Weight: (!) 142.7 kg   Constitutional: NAD, calm, comfortable, morbidly obese Eyes: PERRL, lids and conjunctivae normal ENMT: Mucous membranes are moist. Posterior pharynx clear of any exudate or lesions.Normal dentition.  Neck: normal, supple, no masses,  no thyromegaly Respiratory: clear to auscultation bilaterally, no wheezing, no crackles. Normal respiratory effort. No  accessory muscle use.  Cardiovascular: Regular rate and rhythm, no murmurs / rubs / gallops. No extremity edema. 2+ pedal pulses. No carotid bruits.  Abdomen: no tenderness, no masses palpated. No hepatosplenomegaly. Bowel sounds positive.  Musculoskeletal: no clubbing / cyanosis. No joint deformity upper and lower extremities. Good ROM, no contractures. Normal muscle tone.  Skin: no rashes, lesions, ulcers. No induration Neurologic: CN 2-12 grossly intact. Sensation intact, DTR normal. Strength 5/5 in all 4.  Psychiatric: Normal judgment and insight. Alert and oriented x 3. Normal mood.   Data Reviewed:  There are no new results to review at this time. Data from [facility] reviewed, visible in care-everywhere.  Troponins were elevated at 179 => 1056 => 1253 => 773 => 377 => 277  Initial CXR showed pulm edema   Assessment and Plan: * Demand ischemia of myocardium Pt with demand ischemia from SVT causing Trop rise, peaked at just over 1200, has since been trending back down over past couple of days. CP only with SVT rate of ~200, CP resolved after adenosine converted her back to NSR. Cards also doubts type 1 MI. CP obs pathway Cards to see in consult trops already trended for 3 days, not going to order any more unless she re develops symptoms Tele monitor No need for repeat 2d echo with one done just earlier this month per cards Per cards: since shes already waited 3 days for transfer, might as well see if we can do stress test tomorrow, but strongly doubt she needs LHC NPO after MN  SVT (supraventricular tachycardia) Patient with SVT episode prompting EMS call, resolved with adenosine, none since that single episode 3 days ago. Continue Lopressor '25mg'$  BID started at OSH If she has recurrence in future, will need EP referral for ablation.  Chronic heart failure with preserved ejection fraction (HFpEF) ([place]) New Dx as of earlier this month. 2d Echo = normal EF and grade 2 DD. Cont  home diuretics and ARB Going to DC jardiance as patient has h/o yeast infections to SGLT2 inhibitors (Invokana) previously, and has already seen her PCP for yeast infection this month since starting Jardiance.  OSA (obstructive sleep apnea) Unable to tolerate CPAP.  Morbid obesity with BMI of 50.0-59.9, adult ([place]) Strongly urged patient to talk to PCP and see if theres any way her insurance can make ozempic or mounjaro affordable. BMI 50 with multiple obesity related illnesses including DM2, HTN, etc.  Essential hypertension Continue home BP meds  T2DM (type 2 diabetes mellitus) ([place]) DC Jardiance as discussed above Will put on Lantus 15u for tonight (was on 23 at OSH) Mod scale SSI Q4H      Advance Care Planning:   Code Status: Full Code  Consults: Spoke with Dr. Alveta Heimlich  Family Communication: No family in room  Severity of Illness: The appropriate patient status for this patient is OBSERVATION. Observation status is judged to be reasonable and necessary in order to provide the required intensity of service to ensure the patient's safety. The patient's presenting symptoms, physical exam findings, and initial radiographic and laboratory data in the context of their medical condition is felt to place them at decreased risk for further clinical deterioration. Furthermore, it is anticipated that the patient will be medically stable for discharge from the hospital within 2 midnights of admission.   Author: Etta Quill., DO 06/18/2022 9:30 PM  For on call review [email or web address].

## 2022-06-18 NOTE — Assessment & Plan Note (Signed)
DC Jardiance as discussed above Will put on Lantus 15u for tonight (was on 23 at OSH) Mod scale SSI Q4H

## 2022-06-18 NOTE — Assessment & Plan Note (Signed)
Unable to tolerate CPAP 

## 2022-06-18 NOTE — Consult Note (Signed)
Cardiology Consultation   Patient ID: Kathryn Jimenez MRN: [id number]; DOB: 16-Jun-1971  Admit date: 06/18/2022 Date of Consult: 06/18/2022  PCP:  Ivy Lynn, NP   [place] Providers Cardiologist:  None        Patient Profile:   Kathryn Jimenez is a 51 y.o. female with a hx of obesity, DM2, HTN, HLD, and psychiatric issues who is being seen 06/18/2022 for the evaluation of elevated HS troponin in the setting of SVT at the request of Dr Alcario Drought.  History of Present Illness:   Kathryn Jimenez was transferred from [facility] where she has been hospitalized for the past 3 days after presenting w/ palpitations and chest discomfort due to SVT. She was treated w/ adenosine to terminate the SVT en route to the ED by EMS, and was successfully converted to NSR. HS trop was checked at the hospital, and was 179 => 1056 => 1254 => 773 => 377 => 377 by trend. Pt sx completely resolved once NSR was restored, however she remained hospitalized for the past 3 days awaiting transfer for possible LHC per the cardiology consult note done at [facility] earlier today. Please refer to that note for further detail about the OSH admission.   Past Medical History:  Diagnosis Date   Allergy    Anginal pain ([place])    Anxiety    Arthritis    Asthma    Bladder disorder, other    Borderline personality disorder ([place])    Carotid artery disease ([place]) 03/05/2017   Depression    Diabetes mellitus without complication (HCC)    GERD (gastroesophageal reflux disease)    Headache    Hyperlipidemia    Hypertension    Kidney stones    Mucous retention cyst of maxillary sinus 08/31/2021   Noted on head CT   Neuromuscular disorder (HCC)    Neuropathy   PTSD (post-traumatic stress disorder)    Shortness of breath    Sleep apnea    Stroke ([place]) 2018   Thyroid disease    nodule    Past Surgical History:  Procedure Laterality Date   CARPAL TUNNEL RELEASE     left 2015   CERVICAL FUSION  Bilateral 2003   C 5-6 with titanium screws   CHOLECYSTECTOMY     COLONOSCOPY WITH PROPOFOL N/A 06/24/2014   Procedure: COLONOSCOPY WITH PROPOFOL;  Surgeon: Rogene Houston, MD;  Location: [facility];  Service: Endoscopy;  Laterality: N/A;  cecum time in 0810    time out   0821    total time 11 minutes   DILITATION & CURRETTAGE/HYSTROSCOPY WITH HYDROTHERMAL ABLATION N/A 04/19/2021   Procedure: DILATATION & CURETTAGE/HYSTEROSCOPY WITH HYDROTHERMAL ABLATION;  Surgeon: Janyth Pupa, DO;  Location: [facility];  Service: Gynecology;  Laterality: N/A;   INTRAUTERINE DEVICE (IUD) INSERTION N/A 04/19/2021   Procedure: INTRAUTERINE DEVICE (IUD) INSERTION;  Surgeon: Janyth Pupa, DO;  Location: [facility];  Service: Gynecology;  Laterality: N/A;   POLYPECTOMY N/A 06/24/2014   Procedure: RECTAL POLYPECTOMY;  Surgeon: Rogene Houston, MD;  Location: [facility];  Service: Endoscopy;  Laterality: N/A;   [place] EXTRACTION Bilateral 2001 and 1990s   top and bottom      Home Medications:  Prior to Admission medications   Medication Sig Start Date End Date Taking? Authorizing Provider  acetaminophen (TYLENOL) 500 MG tablet Take 500 mg by mouth every 6 (six) hours as needed for mild pain  or moderate pain.   Yes [provider]  albuterol (VENTOLIN HFA) 108 (90 Base) MCG/ACT inhaler 2 PUFFS INTO THE LUNGS EVERY 6 HOURS AS NEEDED FOR WHEEZING OR SHORTNESS OF BREATH. Patient taking differently: Inhale 2 puffs into the lungs every 6 (six) hours as needed for wheezing or shortness of breath. 04/18/21  Yes Hendricks Limes F, FNP  benzonatate (TESSALON PERLES) 100 MG capsule Take 1 capsule (100 mg total) by mouth 3 (three) times daily as needed. Patient taking differently: Take 100 mg by mouth 3 (three) times daily as needed for cough. 06/01/22  Yes Ivy Lynn, NP  buPROPion (WELLBUTRIN XL) 150 MG 24 hr tablet Take 150 mg by mouth daily. 11/27/16  Yes [provider]  clobetasol cream (TEMOVATE) 3.15 % Apply 1 application topically 2 (two) times daily as needed (scalp psoriasis). 06/13/21  Yes Hendricks Limes F, FNP  clopidogrel (PLAVIX) 75 MG tablet Take 1 tablet (75 mg total) by mouth daily. 03/21/22  Yes Hendricks Limes F, FNP  cycloSPORINE (RESTASIS) 0.05 % ophthalmic emulsion Place 1 drop into both eyes 2 (two) times daily.   Yes [provider]  diphenhydrAMINE (BENADRYL) 25 mg capsule Take 25 mg by mouth every 6 (six) hours as needed for allergies.   Yes [provider]  FLUoxetine (PROZAC) 40 MG capsule Take 40 mg by mouth daily.   Yes [provider]  furosemide (LASIX) 40 MG tablet Take 1 tablet (40 mg total) by mouth 2 (two) times daily. Patient taking differently: Take 40 mg by mouth in the morning. 03/28/22  Yes Loman Brooklyn, FNP  guaiFENesin (MUCINEX) 600 MG 12 hr tablet Take 1 tablet (600 mg total) by mouth 2 (two) times daily. Patient taking differently: Take 600 mg by mouth 2 (two) times daily as needed for to loosen phlegm or cough. 06/01/22  Yes Ivy Lynn, NP  insulin aspart (NOVOLOG) 100 UNIT/ML injection Inject into the skin See admin instructions. Per pump/"2.9 units per hour"   Yes [provider]  Insulin Disposable Pump (OMNIPOD CLASSIC PODS, GEN 3,) MISC Inject 1 Device into the skin every other day. 01/06/20  Yes [provider]  JARDIANCE 10 MG TABS tablet Take 10 mg by mouth daily. 05/25/22  Yes [provider]  levonorgestrel (LILETTA, 52 MG,) 20.1 MCG/DAY IUD 1 each by Intrauterine route once. Inserted 04/19/21   Yes [provider]  LORazepam (ATIVAN) 0.5 MG tablet Take 0.25-0.5 mg by mouth daily as needed for anxiety.   Yes [provider]  losartan (COZAAR) 50 MG tablet Take 50 mg by mouth daily. 05/25/22  Yes [provider]  nystatin (MYCOSTATIN/NYSTOP) powder Apply 1 application  topically daily as needed (for fungal infections).    Yes [provider]  oxybutynin (DITROPAN XL) 10 MG 24 hr tablet Take 1 tablet (10 mg total) by mouth at bedtime. 06/01/22  Yes Ivy Lynn, NP  OXYGEN Inhale 4 L/min into the lungs continuous.   Yes [provider]  potassium chloride (KLOR-CON) 10 MEQ tablet Take 2 tablets (20 mEq total) by mouth daily. 03/21/22  Yes Hendricks Limes F, FNP  prazosin (MINIPRESS) 1 MG capsule Take 1 capsule (1 mg total) by mouth at bedtime. 12/19/21  Yes Hendricks Limes F, FNP  valACYclovir (VALTREX) 500 MG tablet Take 1 tablet (500 mg total) by mouth daily. 06/05/22  Yes Janyth Pupa, DO  Vitamin D, Ergocalciferol, (DRISDOL) 1.25 MG (50000 UNIT) CAPS capsule Take 1 capsule (50,000 Units  total) by mouth 2 (two) times a week. Patient taking differently: Take 50,000 Units by mouth every 7 (seven) days. 03/22/22  Yes Hendricks Limes F, FNP  Incontinence Supply Disposable (PREVAIL IB FULL MAT BRIEF 2XL) MISC 1 each by Does not apply route every 2 (two) hours as needed. 10/04/20   Loman Brooklyn, FNP  OVER THE COUNTER MEDICATION Take 1-2 drops by mouth daily as needed (Thyroid Function). Potassium Iodide 4 %; Iodine 2 %; Distilled water Patient not taking: Reported on 06/18/2022    [provider]  valsartan (DIOVAN) 320 MG tablet TAKE 1 TABLET ONCE DAILY. Patient not taking: Reported on 06/18/2022 04/24/22   Loman Brooklyn, FNP    Inpatient Medications: Scheduled Meds:  Continuous Infusions:  PRN Meds:   Allergies:    Allergies  Allergen Reactions   Actos [Pioglitazone] Swelling   Augmentin [Amoxicillin-Pot Clavulanate] Diarrhea, Nausea Only and Other (See Comments)    Caused severe diarrhea   Citrus     Issues with my tongue    Invokana [Canagliflozin] Other (See Comments)    Yeast infection   Lopid [Gemfibrozil] Other (See Comments)    Made the tongue burn   Metoprolol Other (See Comments)    Headaches    Naproxen Sodium Other (See Comments)    Patient states she felt  loopy   Penicillins Diarrhea and Other (See Comments)    Violent diarrhea- must have Diflucan   Pineapple     Issues with my tongue   Statins Other (See Comments)    Joint pain   Tape Itching and Other (See Comments)    Tape, EKG leads, and Band-Aids - Itching and redness   Amlodipine Cough   Clavulanic Acid Nausea Only   Hctz [Hydrochlorothiazide] Rash   Lisinopril Cough   Morphine And Related Itching and Rash    Social History:   Social History   Socioeconomic History   Marital status: Married    Spouse name: [name]   Number of children: 0   Years of education: HS   Highest education level: Not on file  Occupational History   Occupation: Disabled  Tobacco Use   Smoking status: Never   Smokeless tobacco: Never  Vaping Use   Vaping Use: Never used  Substance and Sexual Activity   Alcohol use: No    Comment: Rarely   Drug use: No   Sexual activity: Not Currently    Birth control/protection: I.U.D.  Other Topics Concern   Not on file  Social History Narrative   Lives at home with husband.   Right-handed.   3-4 cups caffeine per day.   Social Determinants of Health   Financial Resource Strain: Medium Risk (04/03/2021)   Overall Financial Resource Strain (CARDIA)    Difficulty of Paying Living Expenses: Somewhat hard  Food Insecurity: Food Insecurity Present (04/03/2021)   Hunger Vital Sign    Worried About Running Out of Food in the Last Year: Sometimes true    Ran Out of Food in the Last Year: Never true  Transportation Needs: No Transportation Needs (04/03/2021)   PRAPARE - Hydrologist (Medical): No    Lack of Transportation (Non-Medical): No  Physical Activity: Insufficiently Active (04/03/2021)   Exercise Vital Sign    Days of Exercise per Week: 1 day    Minutes of Exercise per Session: 10 min  Stress: No Stress Concern Present (04/03/2021)   [place]  Feeling  of Stress : Only a little  Social Connections: Moderately Isolated (04/03/2021)   Social Connection and Isolation Panel [NHANES]    Frequency of Communication with Friends and Family: Once a week    Frequency of Social Gatherings with Friends and Family: Once a week    Attends Religious Services: 1 to 4 times per year    Active Member of [employer] or Organizations: No    Attends Archivist Meetings: Never    Marital Status: Married  Human resources officer Violence: Not At Risk (04/03/2021)   Humiliation, Afraid, Rape, and Kick questionnaire    Fear of Current or Ex-Partner: No    Emotionally Abused: No    Physically Abused: No    Sexually Abused: No    Family History:    Family History  Problem Relation Age of Onset   COPD Mother    Arthritis Mother    Asthma Mother    Depression Mother    Diabetes Mother    Heart disease Mother    Hyperlipidemia Mother    Hypertension Mother    Mental illness Mother    Skin cancer Mother    Lung cancer Father    Arthritis Father    Depression Father    Drug abuse Father    Early death Father    Kidney disease Father    Mental illness Father    Colon cancer Sister    Birth defects Sister    COPD Sister    Hyperlipidemia Sister    Learning disabilities Sister    Breast cancer Sister    Breast cancer Sister    Colon cancer Sister    Alcohol abuse Paternal Grandmother    Diabetes Paternal Grandmother    Alcohol abuse Paternal Grandfather      ROS:  Please see the history of present illness.   All other ROS reviewed and negative.     Physical Exam/Data:   Vitals:   06/18/22 2015  BP: (!) 145/66  Pulse: 83  Resp: 15  Temp: 99 F (37.2 C)  TempSrc: Oral  SpO2: 94%  Weight: (!) 142.7 kg    Intake/Output Summary (Last 24 hours) at 06/18/2022 2109 Last data filed at 06/18/2022 2018 Gross per 24 hour  Intake --  Output 200 ml  Net -200 ml      06/18/2022    8:15 PM 06/01/2022   11:07 AM 03/21/2022    2:50 PM  Last 3  Weights  Weight (lbs) 314 lb 9.5 oz 352 lb 349 lb  Weight (kg) 142.7 kg 159.666 kg 158.305 kg     Body mass index is 50.78 kg/m.  General:  Well nourished, well developed, in no acute distress HEENT: normal Neck: no JVD Vascular: No carotid bruits; Distal pulses 2+ bilaterally Cardiac:  normal S1, S2; RRR; no murmur  Lungs:  clear to auscultation bilaterally, no wheezing, rhonchi or rales  Abd: soft, nontender, no hepatomegaly  Ext: no edema Musculoskeletal:  No deformities Skin: warm and dry  Neuro:  no focal abnormalities noted Psych:  Normal affect   EKG:  The EKG was personally reviewed and demonstrates:  NSR Telemetry:  Telemetry was personally reviewed and demonstrates:  NSR  Relevant CV Studies: TTE 05-22-22 1. The left ventricle is normal in size with mildly increased wall  thickness.    2. The left ventricular systolic function is normal, LVEF is visually  estimated at 60-65%.    3. There is grade II diastolic dysfunction (elevated filling  pressure)-->E/A ratio given as 1.0, so this may actually be grade I. I am unable to tell without images    4. The left atrium is mildly dilated in size.    5. The right ventricle is mildly dilated in size, with normal systolic  function.    6. IVC size and inspiratory change suggest mildly elevated right atrial pressure. (5-10 mmHg).    Laboratory Data:  High Sensitivity Troponin:  No results for input(s): "TROPONINIHS" in the last 720 hours.   ChemistryNo results for input(s): "NA", "K", "CL", "CO2", "GLUCOSE", "BUN", "CREATININE", "CALCIUM", "MG", "GFRNONAA", "GFRAA", "ANIONGAP" in the last 168 hours.  No results for input(s): "PROT", "ALBUMIN", "AST", "ALT", "ALKPHOS", "BILITOT" in the last 168 hours. Lipids No results for input(s): "CHOL", "TRIG", "HDL", "LABVLDL", "LDLCALC", "CHOLHDL" in the last 168 hours.  HematologyNo results for input(s): "WBC", "RBC", "HGB", "HCT", "MCV", "MCH", "MCHC", "RDW", "PLT" in the last 168  hours. Thyroid No results for input(s): "TSH", "FREET4" in the last 168 hours.  BNPNo results for input(s): "BNP", "PROBNP" in the last 168 hours.  DDimer No results for input(s): "DDIMER" in the last 168 hours.   Radiology/Studies:  No results found.   Assessment and Plan:   Abnormal troponin in setting of SVT terminated by adenosine: mild HS trop leak is likely demand-induced, by tachycardia related to the SVT. This is not likely to be plaque-rupture ACS. Pt had sx during the acute SVT episode that resolved completely following restoration of NSR. She was sent for [facility] but I think non-invasive workup should suffice; will leave NPO for NMST in the AM. No need for therapeutic heparin or lovenox. HTN/HLD: restart home regimen OSA: she has untreated CPAP. Along w/ her obesity, this is probably in part responsible for chronic SOB/DOE 4. PSVT: agree w/ BB; if BB adequately controls any symptoms related to PSVT, this can probably be managed conservatively. If SVT becomes refractory, ablation can ultimately be considered down the road. Can consider ambulatory monitor as OP after discharge to quantify and further eval SVT burden   Risk Assessment/Risk Scores:  {Complete the following score calculators/questions to meet required metrics.  Press F2         :[id number]}   TIMI Risk Score for Unstable Angina or Non-ST Elevation MI:   The patient's TIMI risk score is 2, which indicates a 8% risk of all cause mortality, new or recurrent myocardial infarction or need for urgent revascularization in the next 14 days.{ Click here to calculate score  REFRESH Note before signing  :1}          For questions or updates, please contact [place] Please consult [email or web address] for contact info under    Signed, Rudean Curt, MD, [facility] 06/18/2022 9:09 PM

## 2022-06-19 ENCOUNTER — Ambulatory Visit (HOSPITAL_COMMUNITY)
Admission: EM | Disposition: A | Discharge status: Home / Self Care | Payer: Self-pay | Source: Other Acute Inpatient Hospital | Attending: Family Medicine

## 2022-06-19 DIAGNOSIS — I110 Hypertensive heart disease with heart failure: Secondary | ICD-10-CM | POA: Diagnosis not present

## 2022-06-19 DIAGNOSIS — I2510 Atherosclerotic heart disease of native coronary artery without angina pectoris: Secondary | ICD-10-CM | POA: Diagnosis not present

## 2022-06-19 DIAGNOSIS — Z79899 Other long term (current) drug therapy: Secondary | ICD-10-CM | POA: Diagnosis not present

## 2022-06-19 DIAGNOSIS — F603 Borderline personality disorder: Secondary | ICD-10-CM | POA: Diagnosis not present

## 2022-06-19 DIAGNOSIS — Z6841 Body Mass Index (BMI) 40.0 and over, adult: Secondary | ICD-10-CM | POA: Diagnosis not present

## 2022-06-19 DIAGNOSIS — E785 Hyperlipidemia, unspecified: Secondary | ICD-10-CM | POA: Diagnosis not present

## 2022-06-19 DIAGNOSIS — I272 Pulmonary hypertension, unspecified: Secondary | ICD-10-CM | POA: Diagnosis not present

## 2022-06-19 DIAGNOSIS — G4733 Obstructive sleep apnea (adult) (pediatric): Secondary | ICD-10-CM | POA: Diagnosis not present

## 2022-06-19 DIAGNOSIS — I471 Supraventricular tachycardia, unspecified: Secondary | ICD-10-CM | POA: Diagnosis not present

## 2022-06-19 DIAGNOSIS — Z8673 Personal history of transient ischemic attack (TIA), and cerebral infarction without residual deficits: Secondary | ICD-10-CM | POA: Diagnosis not present

## 2022-06-19 DIAGNOSIS — I2489 Other forms of acute ischemic heart disease: Secondary | ICD-10-CM | POA: Diagnosis not present

## 2022-06-19 DIAGNOSIS — I5033 Acute on chronic diastolic (congestive) heart failure: Secondary | ICD-10-CM | POA: Diagnosis not present

## 2022-06-19 DIAGNOSIS — E119 Type 2 diabetes mellitus without complications: Secondary | ICD-10-CM | POA: Diagnosis not present

## 2022-06-19 DIAGNOSIS — Z7902 Long term (current) use of antithrombotics/antiplatelets: Secondary | ICD-10-CM | POA: Diagnosis not present

## 2022-06-19 HISTORY — PX: RIGHT/LEFT HEART CATH AND CORONARY ANGIOGRAPHY: CATH118266

## 2022-06-19 LAB — CBC WITH DIFFERENTIAL/PLATELET
Abs Immature Granulocytes: 0.15 10*3/uL — ABNORMAL HIGH (ref 0.00–0.07)
Basophils Absolute: 0.1 10*3/uL (ref 0.0–0.1)
Basophils Relative: 1 %
Eosinophils Absolute: 0.2 10*3/uL (ref 0.0–0.5)
Eosinophils Relative: 2 %
HCT: 40.6 % (ref 36.0–46.0)
Hemoglobin: 13 g/dL (ref 12.0–15.0)
Immature Granulocytes: 2 %
Lymphocytes Relative: 39 %
Lymphs Abs: 3.5 10*3/uL (ref 0.7–4.0)
MCH: 27.8 pg (ref 26.0–34.0)
MCHC: 32 g/dL (ref 30.0–36.0)
MCV: 86.8 fL (ref 80.0–100.0)
Monocytes Absolute: 1 10*3/uL (ref 0.1–1.0)
Monocytes Relative: 11 %
Neutro Abs: 4.2 10*3/uL (ref 1.7–7.7)
Neutrophils Relative %: 45 %
Platelets: 470 10*3/uL — ABNORMAL HIGH (ref 150–400)
RBC: 4.68 MIL/uL (ref 3.87–5.11)
RDW: 15.2 % (ref 11.5–15.5)
WBC: 9.1 10*3/uL (ref 4.0–10.5)
nRBC: 0 % (ref 0.0–0.2)

## 2022-06-19 LAB — GLUCOSE, CAPILLARY
Glucose-Capillary: 108 mg/dL — ABNORMAL HIGH (ref 70–99)
Glucose-Capillary: 130 mg/dL — ABNORMAL HIGH (ref 70–99)
Glucose-Capillary: 132 mg/dL — ABNORMAL HIGH (ref 70–99)
Glucose-Capillary: 179 mg/dL — ABNORMAL HIGH (ref 70–99)
Glucose-Capillary: 190 mg/dL — ABNORMAL HIGH (ref 70–99)
Glucose-Capillary: 267 mg/dL — ABNORMAL HIGH (ref 70–99)

## 2022-06-19 LAB — POCT I-STAT EG7
Acid-Base Excess: 1 mmol/L (ref 0.0–2.0)
Acid-Base Excess: 2 mmol/L (ref 0.0–2.0)
Acid-Base Excess: 2 mmol/L (ref 0.0–2.0)
Bicarbonate: 26.7 mmol/L (ref 20.0–28.0)
Bicarbonate: 27.5 mmol/L (ref 20.0–28.0)
Bicarbonate: 27.7 mmol/L (ref 20.0–28.0)
Calcium, Ion: 1.13 mmol/L — ABNORMAL LOW (ref 1.15–1.40)
Calcium, Ion: 1.18 mmol/L (ref 1.15–1.40)
Calcium, Ion: 1.22 mmol/L (ref 1.15–1.40)
HCT: 35 % — ABNORMAL LOW (ref 36.0–46.0)
HCT: 37 % (ref 36.0–46.0)
HCT: 37 % (ref 36.0–46.0)
Hemoglobin: 11.9 g/dL — ABNORMAL LOW (ref 12.0–15.0)
Hemoglobin: 12.6 g/dL (ref 12.0–15.0)
Hemoglobin: 12.6 g/dL (ref 12.0–15.0)
O2 Saturation: 55 %
O2 Saturation: 65 %
O2 Saturation: 87 %
Potassium: 3.8 mmol/L (ref 3.5–5.1)
Potassium: 4 mmol/L (ref 3.5–5.1)
Potassium: 4 mmol/L (ref 3.5–5.1)
Sodium: 136 mmol/L (ref 135–145)
Sodium: 137 mmol/L (ref 135–145)
Sodium: 138 mmol/L (ref 135–145)
TCO2: 28 mmol/L (ref 22–32)
TCO2: 29 mmol/L (ref 22–32)
TCO2: 29 mmol/L (ref 22–32)
pCO2, Ven: 45.4 mmHg (ref 44–60)
pCO2, Ven: 47.6 mmHg (ref 44–60)
pCO2, Ven: 47.9 mmHg (ref 44–60)
pH, Ven: 7.369 (ref 7.25–7.43)
pH, Ven: 7.37 (ref 7.25–7.43)
pH, Ven: 7.378 (ref 7.25–7.43)
pO2, Ven: 30 mmHg — CL (ref 32–45)
pO2, Ven: 35 mmHg (ref 32–45)
pO2, Ven: 55 mmHg — ABNORMAL HIGH (ref 32–45)

## 2022-06-19 LAB — COMPREHENSIVE METABOLIC PANEL
ALT: 24 U/L (ref 0–44)
AST: 23 U/L (ref 15–41)
Albumin: 3 g/dL — ABNORMAL LOW (ref 3.5–5.0)
Alkaline Phosphatase: 86 U/L (ref 38–126)
Anion gap: 12 (ref 5–15)
BUN: 16 mg/dL (ref 6–20)
CO2: 24 mmol/L (ref 22–32)
Calcium: 9.1 mg/dL (ref 8.9–10.3)
Chloride: 98 mmol/L (ref 98–111)
Creatinine, Ser: 0.88 mg/dL (ref 0.44–1.00)
GFR, Estimated: >60 mL/min (ref >60)
Glucose, Bld: 132 mg/dL — ABNORMAL HIGH (ref 70–99)
Potassium: 4.8 mmol/L (ref 3.5–5.1)
Sodium: 134 mmol/L — ABNORMAL LOW (ref 135–145)
Total Bilirubin: 0.7 mg/dL (ref 0.3–1.2)
Total Protein: 7.7 g/dL (ref 6.5–8.1)

## 2022-06-19 SURGERY — RIGHT/LEFT HEART CATH AND CORONARY ANGIOGRAPHY
Anesthesia: LOCAL

## 2022-06-19 MED ORDER — SODIUM CHLORIDE 0.9% FLUSH: 3.0000 mL | Freq: Two times a day (BID) | INTRAVENOUS | Status: DC

## 2022-06-19 MED ORDER — SODIUM CHLORIDE 0.9% FLUSH
3.0000 mL | Freq: Two times a day (BID) | INTRAVENOUS | Status: DC
  Administered 2022-06-20: 3 mL via INTRAVENOUS

## 2022-06-19 MED ORDER — SODIUM CHLORIDE 0.9% FLUSH: 3.0000 mL | INTRAVENOUS | Status: DC | PRN

## 2022-06-19 MED ORDER — ASPIRIN 81 MG PO CHEW: 81.0000 mg | CHEWABLE_TABLET | ORAL | Status: DC

## 2022-06-19 MED ORDER — LIDOCAINE HCL (PF) 1 % IJ SOLN
INTRAMUSCULAR | Status: AC
  Filled 2022-06-19: qty 30

## 2022-06-19 MED ORDER — SODIUM CHLORIDE 0.9 % IV SOLN: 250.0000 mL | INTRAVENOUS | Status: DC | PRN

## 2022-06-19 MED ORDER — IOHEXOL 350 MG/ML SOLN
INTRAVENOUS | Status: DC | PRN
  Administered 2022-06-19: 50 mL

## 2022-06-19 MED ORDER — VERAPAMIL HCL 2.5 MG/ML IV SOLN
INTRAVENOUS | Status: AC
  Filled 2022-06-19: qty 2

## 2022-06-19 MED ORDER — SODIUM CHLORIDE 0.9 % IV SOLN: INTRAVENOUS | Status: DC

## 2022-06-19 MED ORDER — EZETIMIBE 10 MG PO TABS
10.0000 mg | ORAL_TABLET | Freq: Every day | ORAL | Status: DC
  Administered 2022-06-19 – 2022-06-20 (×2): 10 mg via ORAL
  Filled 2022-06-19 (×2): qty 1

## 2022-06-19 MED ORDER — HEPARIN SODIUM (PORCINE) 1000 UNIT/ML IJ SOLN
INTRAMUSCULAR | Status: AC
  Filled 2022-06-19: qty 10

## 2022-06-19 MED ORDER — HEPARIN (PORCINE) IN NACL 1000-0.9 UT/500ML-% IV SOLN
INTRAVENOUS | Status: AC
  Filled 2022-06-19: qty 1000

## 2022-06-19 MED ORDER — HEPARIN (PORCINE) IN NACL 1000-0.9 UT/500ML-% IV SOLN
INTRAVENOUS | Status: AC
  Filled 2022-06-19: qty 500

## 2022-06-19 MED ORDER — HYDRALAZINE HCL 20 MG/ML IJ SOLN: 10.0000 mg | INTRAMUSCULAR | Status: AC | PRN

## 2022-06-19 MED ORDER — HEPARIN SODIUM (PORCINE) 1000 UNIT/ML IJ SOLN
INTRAMUSCULAR | Status: DC | PRN
  Administered 2022-06-19: 6000 [IU] via INTRAVENOUS

## 2022-06-19 MED ORDER — FENTANYL CITRATE (PF) 100 MCG/2ML IJ SOLN
INTRAMUSCULAR | Status: AC
  Filled 2022-06-19: qty 2

## 2022-06-19 MED ORDER — MIDAZOLAM HCL 2 MG/2ML IJ SOLN
INTRAMUSCULAR | Status: AC
  Filled 2022-06-19: qty 2

## 2022-06-19 MED ORDER — ASPIRIN 81 MG PO CHEW
81.0000 mg | CHEWABLE_TABLET | ORAL | Status: AC
  Administered 2022-06-19: 81 mg via ORAL
  Filled 2022-06-19: qty 1

## 2022-06-19 MED ORDER — MIDAZOLAM HCL 2 MG/2ML IJ SOLN
INTRAMUSCULAR | Status: DC | PRN
  Administered 2022-06-19: 1 mg via INTRAVENOUS

## 2022-06-19 MED ORDER — LIDOCAINE HCL (PF) 1 % IJ SOLN
INTRAMUSCULAR | Status: DC | PRN
  Administered 2022-06-19 (×2): 2 mL

## 2022-06-19 MED ORDER — FENTANYL CITRATE (PF) 100 MCG/2ML IJ SOLN
INTRAMUSCULAR | Status: DC | PRN
  Administered 2022-06-19: 25 ug via INTRAVENOUS

## 2022-06-19 MED ORDER — VERAPAMIL HCL 2.5 MG/ML IV SOLN
INTRAVENOUS | Status: DC | PRN
  Administered 2022-06-19: 10 mL via INTRA_ARTERIAL

## 2022-06-19 MED ORDER — HEPARIN (PORCINE) IN NACL 1000-0.9 UT/500ML-% IV SOLN
INTRAVENOUS | Status: DC | PRN
  Administered 2022-06-19 (×2): 500 mL

## 2022-06-19 MED ORDER — SODIUM CHLORIDE 0.9 % IV SOLN: INTRAVENOUS | Status: AC

## 2022-06-19 SURGICAL SUPPLY — 12 items
CATH 5FR JL3.5 JR4 ANG PIG MP (CATHETERS) IMPLANT
CATH SWAN GANZ 7F STRAIGHT (CATHETERS) IMPLANT
GLIDESHEATH SLEND SS 6F .021 (SHEATH) IMPLANT
GLIDESHEATH SLENDER 7FR .021G (SHEATH) IMPLANT
GUIDEWIRE INQWIRE 1.5J.035X260 (WIRE) IMPLANT
INQWIRE 1.5J .035X260CM (WIRE) ×1
KIT HEART LEFT (KITS) ×1 IMPLANT
PACK CARDIAC CATHETERIZATION (CUSTOM PROCEDURE TRAY) ×1 IMPLANT
SHEATH GLIDE SLENDER 4/5FR (SHEATH) IMPLANT
SHEATH PROBE COVER 6X72 (BAG) IMPLANT
TRANSDUCER W/STOPCOCK (MISCELLANEOUS) ×1 IMPLANT
TUBING CIL FLEX 10 FLL-RA (TUBING) ×1 IMPLANT

## 2022-06-19 NOTE — Interval H&P Note (Signed)
History and Physical Interval Note:  06/19/2022 4:19 PM  Kathryn Jimenez  has presented today for surgery, with the diagnosis of nstemi.  The various methods of treatment have been discussed with the patient and family. After consideration of risks, benefits and other options for treatment, the patient has consented to  Procedure(s): RIGHT/LEFT HEART CATH AND CORONARY ANGIOGRAPHY (N/A) as a surgical intervention.  The patient's history has been reviewed, patient examined, no change in status, stable for surgery.  I have reviewed the patient's chart and labs.  Questions were answered to the patient's satisfaction.     Sherren Mocha

## 2022-06-19 NOTE — H&P (View-Only) (Signed)
Rounding Note    Patient Name: Kathryn Jimenez Date of Encounter: 06/19/2022  [facility] Cardiologist: None   Subjective   Patient doing well this morning. She was transferred from [facility] to [facility] overnight for possible cardiac catheterization after being found to have an elevated troponin in setting of SVT with heart rates initially in the 190s. She received Adenosine in the field with conversion to sinus rhythm and improvement in symptoms. No recurrent SVT noted on telemetry. He is have some musculoskeletal chest pain that is worse when taking a deep breath and and with palpation after having a prolonged cough from recent pneumonia. He also had some feeling of her heart pounding outside her chest while in SVT. But no chest pain that sounds concerning for angina. She has had some shortness of breath since her pneumonia but improved some after receiving IV Lasix at [facility] ED. Still having some shortness of breath.  Inpatient Medications    Scheduled Meds:  buPROPion  150 mg Oral Daily   clopidogrel  75 mg Oral Daily   cycloSPORINE  1 drop Both Eyes BID   enoxaparin (LOVENOX) injection  40 mg Subcutaneous Q24H   FLUoxetine  40 mg Oral Daily   furosemide  40 mg Oral q AM   insulin aspart  0-15 Units Subcutaneous Q4H   insulin glargine-yfgn  15 Units Subcutaneous QHS   losartan  50 mg Oral Daily   metoprolol tartrate  25 mg Oral BID   nystatin   Topical TID   oxybutynin  10 mg Oral QHS   potassium chloride  20 mEq Oral Daily   prazosin  1 mg Oral QHS   valACYclovir  500 mg Oral Daily   Continuous Infusions:  PRN Meds: acetaminophen, LORazepam, ondansetron (ZOFRAN) IV   Vital Signs    Vitals:   06/18/22 2015 06/19/22 0024 06/19/22 0450 06/19/22 0745  BP: (!) 145/66 128/78 (!) 105/46 124/69  Pulse: 83 81 78 69  Resp: 15 12 (!) 21 18  Temp: 99 F (37.2 C) 98.1 F (36.7 C) 98 F (36.7 C) 98.2 F (36.8 C)  TempSrc: Oral Oral Oral Oral  SpO2:  94% 95% 90% 92%  Weight: (!) 142.7 kg (!) 142.7 kg (!) 142.7 kg     Intake/Output Summary (Last 24 hours) at 06/19/2022 0756 Last data filed at 06/19/2022 0200 Gross per 24 hour  Intake 240 ml  Output 750 ml  Net -510 ml      06/19/2022    4:50 AM 06/19/2022   12:24 AM 06/18/2022    8:15 PM  Last 3 Weights  Weight (lbs) 314 lb 9.5 oz 314 lb 9.5 oz 314 lb 9.5 oz  Weight (kg) 142.7 kg 142.7 kg 142.7 kg      Telemetry    Sinus rhythm with occasional PACs. Rates in the 9s to 85s. - Personally Reviewed  ECG    No new ECG tracing in our system. - Personally Reviewed  Physical Exam   GEN: Morbidly obese Caucasian female in no acute distress.   Neck: JVD difficult to assess due to body habitus. Cardiac: RRR. No murmurs, rubs, or gallops. Reproducible chest pain with palpation along left side of chest under left breast. Respiratory: On supplemental O2 via nasal cannula. No significant increased work of breathing. Clear to auscultation bilaterally. GI: Soft, non-distended, and non-tender. MS: No significant lower extremity edema. No deformity. Neuro:  No focal deficits. Psych: Normal affect. Responds appropriately.  Labs  High Sensitivity Troponin:  No results for input(s): "TROPONINIHS" in the last 720 hours.   ChemistryNo results for input(s): "NA", "K", "CL", "CO2", "GLUCOSE", "BUN", "CREATININE", "CALCIUM", "MG", "PROT", "ALBUMIN", "AST", "ALT", "ALKPHOS", "BILITOT", "GFRNONAA", "GFRAA", "ANIONGAP" in the last 168 hours.  Lipids No results for input(s): "CHOL", "TRIG", "HDL", "LABVLDL", "LDLCALC", "CHOLHDL" in the last 168 hours.  HematologyNo results for input(s): "WBC", "RBC", "HGB", "HCT", "MCV", "MCH", "MCHC", "RDW", "PLT" in the last 168 hours. Thyroid No results for input(s): "TSH", "FREET4" in the last 168 hours.  BNPNo results for input(s): "BNP", "PROBNP" in the last 168 hours.  DDimer No results for input(s): "DDIMER" in the last 168 hours.   Radiology    No  results found.  Cardiac Studies   Echocardiogram 02/21/2017: Study Conclusions: - Procedure narrative: Transthoracic echocardiography. Image    quality was suboptimal. Unable to adequately visualize    endocardium.  - Left ventricle: The cavity size was normal. Wall thickness was    increased in a pattern of mild LVH. Systolic function was normal.    The estimated ejection fraction was in the range of 60% to 65%.    Left ventricular diastolic function parameters were normal.  - Atrial septum: No defect or patent foramen ovale was identified.    Patient Profile     51 y.o. female with a history of hypertension, hyperlipidemia, type 2 diabetes mellitus, CVA in 2018, morbid obesity with BMI of 50, PTSD, and borderline personality disorder who presented to [facility] ED on 06/15/2022 for further evaluation of palpitations and SVT. She was treated with Adenosine in the field with restoration of normal sinus rhythm. High-sensitivity troponin was noted to be elevated and peaked at 1,254. Therefore, she was transferred to [facility] for possible cardiac catheterization.   Assessment & Plan    Chest Pain Demand Ischemia vs NSTEMI Patient presented with palpitations and chest pain and was found to be in SVT. Symptoms resolved with return of sinus rhythm. Work-up revealed elevated troponin. High-sensitivity troponin 179 >> 1,056 >> 1,254 >>  773 >> 377 >> 377. Recent Echo on 05/22/2022 during an admission for pneumonia showed normal LV function. - She has some atypical musculoskeletal chest pain from prolonged coughing from recent pneumonia but nothing that sounds like angina. - Will repeat EKG here now that she is back in sinus rhythm. - Troponin elevation could be due to demand ischemia in setting of SVT. However, she does have multiple CV risk factors including HTN, HLD, DM, prior CVA, and obesity. She was transferred to [facility] for possible ischemic evaluation. I think coronary CTA or cardiac  catheterization would be the best option for her. Don't think Myoview is a good option for her given morbid obesity. Will discuss with MD.   Paroxysmal SVT Presented in SVT with rates as high as the 190s. Received Adenosine in the field and converted to sinus rhythm. - Maintaining sinus rhythm this morning.  - Started on Lopressor '25mg'$  twice daily. Continue. - Consider outpatient monitor at discharged to monitor for SVT burden.  Possible Mild Acute on Chronic Diastolic CHF Pro-BNP at [facility] ED elevated at 663 >> 1,027 >> 291. Chest x-ray showed stable cardiomegaly with bilateral interstitial and patchy aveolar airspace opacities but no focal consolidation. Recent Echo 05/22/2022 during an admission for pneumonia showed LVEFof 60-65% with grade 2 diastolic dysfunction. She was given IV Lasix and then was restarted on home PO Lasix. - Volume status difficult to assess due to body habitus but  she does not look significantly volume overloaded.  - Continue home PO Lasix '40mg'$  daily. - Can resume home Jardiance '10mg'$  daily.  Hypertension BP mostly well controlled. - Continue Losartan '50mg'$  daily. - Continue Lopressor '25mg'$  twice daily.  Hyperlipidemia Lipid panel in 03/2022: Total Cholesterol 188, Triglycerides 185, HDL 39, LDL 116. LDL goal < 55. - Previously intolerant to statins.  - Will start Zetia '10mg'$  daily and recommend outpatient referral to [facility] for consideration of PCSK9 inhibitor.  Type 2 Diabetes Mellitus Hemoglobin A1c 6.9% this admission.  - Management per primary team.  History of Stroke History of stroke in 2018. - On Plavix '75mg'$  daily. - Previously intolerant to statins.  - Will start Zetia '10mg'$  daily and recommend outpatient referral to [facility] for consideration of PCSK9 inhibitor.  For questions or updates, please contact [place] Please consult [email or web address] for contact info under        Signed, Darreld Mclean, PA-C  06/19/2022,  7:56 AM     Patient seen and examined   I agree with findings as noted above by C Sarajane Jews Pt is a 51 yo with hx of DM, HTN, HL, CVA, obesity  Recovering from a URI Last Friday developed significant tachycardia   Called EMS  Found to be in SVT  Converted with adenosine  Taken to [facility]    She was transferred here  Labs significant for trop over 1000.       The pt notes increased SOB with activity over months     Echo on 05/22/22 showed LVEF normal   Gr II diasotlic dysfunction   RV noted to be mildly dilated   RA pressures increaed    On exam, she is comfortable sitting in bed Neck:  Full, unable to assess JVP    Lungs are relatively CTA Cardiac RRR  No S3  no signifcant murmurs Abd   Obese  Suppple   Ext with tr edema   Given Hx of progressive SOB, risk fasctors for CAD and troponin bump after a rel short period of SVT I would recomm L heart cath to define anatomy   WIth SOB and echo findigns as well as difficulty of exam given size I would also recomm a R heart cath to define pressures  Risks/benefits described  Pt understands and agrees to proceed.  Dorris Carnes MD

## 2022-06-19 NOTE — Progress Notes (Addendum)
Rounding Note    Patient Name: Kathryn Jimenez Date of Encounter: 06/19/2022  [facility] Cardiologist: None   Subjective   Patient doing well this morning. She was transferred from [facility] to [facility] overnight for possible cardiac catheterization after being found to have an elevated troponin in setting of SVT with heart rates initially in the 190s. She received Adenosine in the field with conversion to sinus rhythm and improvement in symptoms. No recurrent SVT noted on telemetry. He is have some musculoskeletal chest pain that is worse when taking a deep breath and and with palpation after having a prolonged cough from recent pneumonia. He also had some feeling of her heart pounding outside her chest while in SVT. But no chest pain that sounds concerning for angina. She has had some shortness of breath since her pneumonia but improved some after receiving IV Lasix at [facility] ED. Still having some shortness of breath.  Inpatient Medications    Scheduled Meds:  buPROPion  150 mg Oral Daily   clopidogrel  75 mg Oral Daily   cycloSPORINE  1 drop Both Eyes BID   enoxaparin (LOVENOX) injection  40 mg Subcutaneous Q24H   FLUoxetine  40 mg Oral Daily   furosemide  40 mg Oral q AM   insulin aspart  0-15 Units Subcutaneous Q4H   insulin glargine-yfgn  15 Units Subcutaneous QHS   losartan  50 mg Oral Daily   metoprolol tartrate  25 mg Oral BID   nystatin   Topical TID   oxybutynin  10 mg Oral QHS   potassium chloride  20 mEq Oral Daily   prazosin  1 mg Oral QHS   valACYclovir  500 mg Oral Daily   Continuous Infusions:  PRN Meds: acetaminophen, LORazepam, ondansetron (ZOFRAN) IV   Vital Signs    Vitals:   06/18/22 2015 06/19/22 0024 06/19/22 0450 06/19/22 0745  BP: (!) 145/66 128/78 (!) 105/46 124/69  Pulse: 83 81 78 69  Resp: 15 12 (!) 21 18  Temp: 99 F (37.2 C) 98.1 F (36.7 C) 98 F (36.7 C) 98.2 F (36.8 C)  TempSrc: Oral Oral Oral Oral  SpO2:  94% 95% 90% 92%  Weight: (!) 142.7 kg (!) 142.7 kg (!) 142.7 kg     Intake/Output Summary (Last 24 hours) at 06/19/2022 0756 Last data filed at 06/19/2022 0200 Gross per 24 hour  Intake 240 ml  Output 750 ml  Net -510 ml      06/19/2022    4:50 AM 06/19/2022   12:24 AM 06/18/2022    8:15 PM  Last 3 Weights  Weight (lbs) 314 lb 9.5 oz 314 lb 9.5 oz 314 lb 9.5 oz  Weight (kg) 142.7 kg 142.7 kg 142.7 kg      Telemetry    Sinus rhythm with occasional PACs. Rates in the 15s to 2s. - Personally Reviewed  ECG    No new ECG tracing in our system. - Personally Reviewed  Physical Exam   GEN: Morbidly obese Caucasian female in no acute distress.   Neck: JVD difficult to assess due to body habitus. Cardiac: RRR. No murmurs, rubs, or gallops. Reproducible chest pain with palpation along left side of chest under left breast. Respiratory: On supplemental O2 via nasal cannula. No significant increased work of breathing. Clear to auscultation bilaterally. GI: Soft, non-distended, and non-tender. MS: No significant lower extremity edema. No deformity. Neuro:  No focal deficits. Psych: Normal affect. Responds appropriately.  Labs  High Sensitivity Troponin:  No results for input(s): "TROPONINIHS" in the last 720 hours.   ChemistryNo results for input(s): "NA", "K", "CL", "CO2", "GLUCOSE", "BUN", "CREATININE", "CALCIUM", "MG", "PROT", "ALBUMIN", "AST", "ALT", "ALKPHOS", "BILITOT", "GFRNONAA", "GFRAA", "ANIONGAP" in the last 168 hours.  Lipids No results for input(s): "CHOL", "TRIG", "HDL", "LABVLDL", "LDLCALC", "CHOLHDL" in the last 168 hours.  HematologyNo results for input(s): "WBC", "RBC", "HGB", "HCT", "MCV", "MCH", "MCHC", "RDW", "PLT" in the last 168 hours. Thyroid No results for input(s): "TSH", "FREET4" in the last 168 hours.  BNPNo results for input(s): "BNP", "PROBNP" in the last 168 hours.  DDimer No results for input(s): "DDIMER" in the last 168 hours.   Radiology    No  results found.  Cardiac Studies   Echocardiogram 02/21/2017: Study Conclusions: - Procedure narrative: Transthoracic echocardiography. Image    quality was suboptimal. Unable to adequately visualize    endocardium.  - Left ventricle: The cavity size was normal. Wall thickness was    increased in a pattern of mild LVH. Systolic function was normal.    The estimated ejection fraction was in the range of 60% to 65%.    Left ventricular diastolic function parameters were normal.  - Atrial septum: No defect or patent foramen ovale was identified.    Patient Profile     51 y.o. female with a history of hypertension, hyperlipidemia, type 2 diabetes mellitus, CVA in 2018, morbid obesity with BMI of 50, PTSD, and borderline personality disorder who presented to [facility] ED on 06/15/2022 for further evaluation of palpitations and SVT. She was treated with Adenosine in the field with restoration of normal sinus rhythm. High-sensitivity troponin was noted to be elevated and peaked at 1,254. Therefore, she was transferred to [facility] for possible cardiac catheterization.   Assessment & Plan    Chest Pain Demand Ischemia vs NSTEMI Patient presented with palpitations and chest pain and was found to be in SVT. Symptoms resolved with return of sinus rhythm. Work-up revealed elevated troponin. High-sensitivity troponin 179 >> 1,056 >> 1,254 >>  773 >> 377 >> 377. Recent Echo on 05/22/2022 during an admission for pneumonia showed normal LV function. - She has some atypical musculoskeletal chest pain from prolonged coughing from recent pneumonia but nothing that sounds like angina. - Will repeat EKG here now that she is back in sinus rhythm. - Troponin elevation could be due to demand ischemia in setting of SVT. However, she does have multiple CV risk factors including HTN, HLD, DM, prior CVA, and obesity. She was transferred to [facility] for possible ischemic evaluation. I think coronary CTA or cardiac  catheterization would be the best option for her. Don't think Myoview is a good option for her given morbid obesity. Will discuss with MD.   Paroxysmal SVT Presented in SVT with rates as high as the 190s. Received Adenosine in the field and converted to sinus rhythm. - Maintaining sinus rhythm this morning.  - Started on Lopressor '25mg'$  twice daily. Continue. - Consider outpatient monitor at discharged to monitor for SVT burden.  Possible Mild Acute on Chronic Diastolic CHF Pro-BNP at [facility] ED elevated at 663 >> 1,027 >> 291. Chest x-ray showed stable cardiomegaly with bilateral interstitial and patchy aveolar airspace opacities but no focal consolidation. Recent Echo 05/22/2022 during an admission for pneumonia showed LVEFof 60-65% with grade 2 diastolic dysfunction. She was given IV Lasix and then was restarted on home PO Lasix. - Volume status difficult to assess due to body habitus but  she does not look significantly volume overloaded.  - Continue home PO Lasix '40mg'$  daily. - Can resume home Jardiance '10mg'$  daily.  Hypertension BP mostly well controlled. - Continue Losartan '50mg'$  daily. - Continue Lopressor '25mg'$  twice daily.  Hyperlipidemia Lipid panel in 03/2022: Total Cholesterol 188, Triglycerides 185, HDL 39, LDL 116. LDL goal < 55. - Previously intolerant to statins.  - Will start Zetia '10mg'$  daily and recommend outpatient referral to [facility] for consideration of PCSK9 inhibitor.  Type 2 Diabetes Mellitus Hemoglobin A1c 6.9% this admission.  - Management per primary team.  History of Stroke History of stroke in 2018. - On Plavix '75mg'$  daily. - Previously intolerant to statins.  - Will start Zetia '10mg'$  daily and recommend outpatient referral to [facility] for consideration of PCSK9 inhibitor.  For questions or updates, please contact [place] Please consult [email or web address] for contact info under        Signed, Darreld Mclean, PA-C  06/19/2022,  7:56 AM     Patient seen and examined   I agree with findings as noted above by C Sarajane Jews Pt is a 51 yo with hx of DM, HTN, HL, CVA, obesity  Recovering from a URI Last Friday developed significant tachycardia   Called EMS  Found to be in SVT  Converted with adenosine  Taken to [facility]    She was transferred here  Labs significant for trop over 1000.       The pt notes increased SOB with activity over months     Echo on 05/22/22 showed LVEF normal   Gr II diasotlic dysfunction   RV noted to be mildly dilated   RA pressures increaed    On exam, she is comfortable sitting in bed Neck:  Full, unable to assess JVP    Lungs are relatively CTA Cardiac RRR  No S3  no signifcant murmurs Abd   Obese  Suppple   Ext with tr edema   Given Hx of progressive SOB, risk fasctors for CAD and troponin bump after a rel short period of SVT I would recomm L heart cath to define anatomy   WIth SOB and echo findigns as well as difficulty of exam given size I would also recomm a R heart cath to define pressures  Risks/benefits described  Pt understands and agrees to proceed.  Dorris Carnes MD

## 2022-06-19 NOTE — Progress Notes (Signed)
Obtained the consent form and placed it in pt's chart.   Lavenia Atlas, RN

## 2022-06-19 NOTE — Progress Notes (Signed)
Patient admitted to unit from [facility] via [place].  Alert and Oriented x 4.  O2 on at 4l/min n/c.  Patient denies pain and no distress noted.  Patient oriented to room and unit.  Call bell placed in reach of patient.

## 2022-06-19 NOTE — Progress Notes (Signed)
PROGRESS NOTE   Kathryn Jimenez  UMP:[id number] DOB: March 10, 1971 DOA: 06/18/2022 PCP: Ivy Lynn, NP  Brief Narrative:  51 year old white female Known history of HTN HLD obesity PTSD BMI 50 Prior small vessel disease/small strokes? Recent diagnosis of HFpEF grade 2 diastolic dysfunction EF normal  Patient admitted 10/27 to [facility] chest pain shortness of breath palpitations found to have SVT in the 200s given adenosine and cardioverted troponins peaked at 1200 and patient was transferred due to positive troponins and possible need for left heart cath There is a 3-day delay on transfer Cardiology was consulted-troponins trended as high as 1254 however has resolved  Cardiology consulted and recommending cardiac cath   [facility] based course  SVT Resolved at this time-continues on metoprolol 25 twice daily and will keep on telemetry  ?  Troponin elevation query cause Cardiac cath showed right PDA lesion but medical therapy is being recommended-defer to cardiology Currently: Aspirin 81 Plavix 75 Zetia 10 losartan 50 prazosin 1  PTSD depression Continue Wellbutrin 150 Prozac 40 and Ativan as needed  DM TY 2 Continue Lantus 15 and sliding scale coverage at mealtime if needed  DVT prophylaxis: Lovenox Code Status: Full Family Communication: None Disposition:  Status is: Observation The patient remains OBS appropriate and will d/c before 2 midnights.   Consultants:  Cardiology  Procedures: Cath  Antimicrobials: None   Subjective: No chest pain mainly shortness of breath when was seen so before cath earlier this afternoon  Objective: Vitals:   06/19/22 0024 06/19/22 0450 06/19/22 0745 06/19/22 0800  BP: 128/78 (!) 105/46 124/69   Pulse: 81 78 69   Resp: 12 (!) 21 18   Temp: 98.1 F (36.7 C) 98 F (36.7 C) 98.2 F (36.8 C)   TempSrc: Oral Oral Oral   SpO2: 95% 90% 92% 94%  Weight: (!) 142.7 kg (!) 142.7 kg      Intake/Output Summary (Last 24  hours) at 06/19/2022 0809 Last data filed at 06/19/2022 0200 Gross per 24 hour  Intake 240 ml  Output 750 ml  Net -510 ml   Filed Weights   06/18/22 2015 06/19/22 0024 06/19/22 0450  Weight: (!) 142.7 kg (!) 142.7 kg (!) 142.7 kg    Examination:  EOMI NCAT thick neck Mallampati 4 no icterus CTA B ROM intact No lower extremity edema Abdomen obese nontender no rebound no guarding S1-S2 no murmur telemetry reviewed seems to be sinus  Data Reviewed: personally reviewed   CBC    Component Value Date/Time   WBC 7.3 11/16/2021 1039   WBC 7.2 04/18/2021 1157   RBC 4.79 11/16/2021 1039   RBC 4.83 04/18/2021 1157   HGB 13.7 11/16/2021 1039   HCT 41.5 11/16/2021 1039   PLT 349 11/16/2021 1039   MCV 87 11/16/2021 1039   MCH 28.6 11/16/2021 1039   MCH 29.0 04/18/2021 1157   MCHC 33.0 11/16/2021 1039   MCHC 32.4 04/18/2021 1157   RDW 13.4 11/16/2021 1039   LYMPHSABS 3.4 (H) 11/16/2021 1039   EOSABS 0.1 11/16/2021 1039   BASOSABS 0.1 11/16/2021 1039      Latest Ref Rng & Units 03/21/2022    3:40 PM 11/16/2021   10:39 AM 08/30/2021   11:23 AM  CMP  Glucose 70 - 99 mg/dL 251  152  125   BUN 6 - 24 mg/dL '10  8  8   '$ Creatinine 0.57 - 1.00 mg/dL 0.86  0.84  0.86   Sodium 134 - 144 mmol/L 137  138  143   Potassium 3.5 - 5.2 mmol/L 4.8  4.4  4.5   Chloride 96 - 106 mmol/L 99  99  103   CO2 20 - 29 mmol/L '24  27  25   '$ Calcium 8.7 - 10.2 mg/dL 9.6  9.5  9.6   Total Protein 6.0 - 8.5 g/dL 6.4  7.0  6.9   Total Bilirubin 0.0 - 1.2 mg/dL 0.3  0.3  0.2   Alkaline Phos 44 - 121 IU/L 103  103  120   AST 0 - 40 IU/L '13  13  11   '$ ALT 0 - 32 IU/L '15  17  14      '$ Radiology Studies: No results found.   Scheduled Meds:  buPROPion  150 mg Oral Daily   clopidogrel  75 mg Oral Daily   cycloSPORINE  1 drop Both Eyes BID   enoxaparin (LOVENOX) injection  40 mg Subcutaneous Q24H   FLUoxetine  40 mg Oral Daily   furosemide  40 mg Oral q AM   insulin aspart  0-15 Units Subcutaneous Q4H    insulin glargine-yfgn  15 Units Subcutaneous QHS   losartan  50 mg Oral Daily   metoprolol tartrate  25 mg Oral BID   nystatin   Topical TID   oxybutynin  10 mg Oral QHS   potassium chloride  20 mEq Oral Daily   prazosin  1 mg Oral QHS   valACYclovir  500 mg Oral Daily   Continuous Infusions:   LOS: 1 day   Time spent: 32  Nita Sells, MD [facility] To contact the attending provider between 7A-7P or the covering provider during after hours 7P-7A, please log into the web site [email or web address] and access using universal [place] password for that web site. If you do not have the password, please call the hospital operator.  06/19/2022, 8:09 AM

## 2022-06-20 ENCOUNTER — Encounter (HOSPITAL_COMMUNITY): Payer: Self-pay | Admitting: Cardiovascular Disease

## 2022-06-20 ENCOUNTER — Ambulatory Visit: Payer: BC Managed Care – PPO | Admitting: Nurse Practitioner

## 2022-06-20 DIAGNOSIS — I5033 Acute on chronic diastolic (congestive) heart failure: Secondary | ICD-10-CM | POA: Diagnosis not present

## 2022-06-20 DIAGNOSIS — I110 Hypertensive heart disease with heart failure: Secondary | ICD-10-CM | POA: Diagnosis not present

## 2022-06-20 DIAGNOSIS — E785 Hyperlipidemia, unspecified: Secondary | ICD-10-CM | POA: Diagnosis not present

## 2022-06-20 DIAGNOSIS — Z7902 Long term (current) use of antithrombotics/antiplatelets: Secondary | ICD-10-CM | POA: Diagnosis not present

## 2022-06-20 DIAGNOSIS — Z6841 Body Mass Index (BMI) 40.0 and over, adult: Secondary | ICD-10-CM | POA: Diagnosis not present

## 2022-06-20 DIAGNOSIS — F603 Borderline personality disorder: Secondary | ICD-10-CM | POA: Diagnosis not present

## 2022-06-20 DIAGNOSIS — I272 Pulmonary hypertension, unspecified: Secondary | ICD-10-CM | POA: Diagnosis not present

## 2022-06-20 DIAGNOSIS — Z79899 Other long term (current) drug therapy: Secondary | ICD-10-CM | POA: Diagnosis not present

## 2022-06-20 DIAGNOSIS — G4733 Obstructive sleep apnea (adult) (pediatric): Secondary | ICD-10-CM | POA: Diagnosis not present

## 2022-06-20 DIAGNOSIS — I2489 Other forms of acute ischemic heart disease: Secondary | ICD-10-CM | POA: Diagnosis not present

## 2022-06-20 DIAGNOSIS — E119 Type 2 diabetes mellitus without complications: Secondary | ICD-10-CM | POA: Diagnosis not present

## 2022-06-20 DIAGNOSIS — I471 Supraventricular tachycardia, unspecified: Secondary | ICD-10-CM | POA: Diagnosis not present

## 2022-06-20 DIAGNOSIS — Z8673 Personal history of transient ischemic attack (TIA), and cerebral infarction without residual deficits: Secondary | ICD-10-CM | POA: Diagnosis not present

## 2022-06-20 DIAGNOSIS — I2510 Atherosclerotic heart disease of native coronary artery without angina pectoris: Secondary | ICD-10-CM | POA: Diagnosis not present

## 2022-06-20 LAB — CBC WITH DIFFERENTIAL/PLATELET
Abs Immature Granulocytes: 0.05 10*3/uL (ref 0.00–0.07)
Basophils Absolute: 0.1 10*3/uL (ref 0.0–0.1)
Basophils Relative: 1 %
Eosinophils Absolute: 0.2 10*3/uL (ref 0.0–0.5)
Eosinophils Relative: 3 %
HCT: 37.2 % (ref 36.0–46.0)
Hemoglobin: 11.7 g/dL — ABNORMAL LOW (ref 12.0–15.0)
Immature Granulocytes: 1 %
Lymphocytes Relative: 34 %
Lymphs Abs: 2.6 10*3/uL (ref 0.7–4.0)
MCH: 27.5 pg (ref 26.0–34.0)
MCHC: 31.5 g/dL (ref 30.0–36.0)
MCV: 87.3 fL (ref 80.0–100.0)
Monocytes Absolute: 0.9 10*3/uL (ref 0.1–1.0)
Monocytes Relative: 11 %
Neutro Abs: 3.8 10*3/uL (ref 1.7–7.7)
Neutrophils Relative %: 50 %
Platelets: 382 10*3/uL (ref 150–400)
RBC: 4.26 MIL/uL (ref 3.87–5.11)
RDW: 15.2 % (ref 11.5–15.5)
WBC: 7.6 10*3/uL (ref 4.0–10.5)
nRBC: 0 % (ref 0.0–0.2)

## 2022-06-20 LAB — BASIC METABOLIC PANEL
Anion gap: 8 (ref 5–15)
BUN: 16 mg/dL (ref 6–20)
CO2: 25 mmol/L (ref 22–32)
Calcium: 8.7 mg/dL — ABNORMAL LOW (ref 8.9–10.3)
Chloride: 102 mmol/L (ref 98–111)
Creatinine, Ser: 0.86 mg/dL (ref 0.44–1.00)
GFR, Estimated: >60 mL/min (ref >60)
Glucose, Bld: 166 mg/dL — ABNORMAL HIGH (ref 70–99)
Potassium: 4.1 mmol/L (ref 3.5–5.1)
Sodium: 135 mmol/L (ref 135–145)

## 2022-06-20 LAB — GLUCOSE, CAPILLARY
Glucose-Capillary: 157 mg/dL — ABNORMAL HIGH (ref 70–99)
Glucose-Capillary: 170 mg/dL — ABNORMAL HIGH (ref 70–99)
Glucose-Capillary: 183 mg/dL — ABNORMAL HIGH (ref 70–99)
Glucose-Capillary: 258 mg/dL — ABNORMAL HIGH (ref 70–99)

## 2022-06-20 MED ORDER — METOPROLOL SUCCINATE ER 50 MG PO TB24: 50.0000 mg | ORAL_TABLET | Freq: Every day | ORAL | Status: DC

## 2022-06-20 MED ORDER — METOPROLOL SUCCINATE ER 50 MG PO TB24: 50.0000 mg | ORAL_TABLET | Freq: Every day | ORAL | 3 refills | Status: DC

## 2022-06-20 MED ORDER — EZETIMIBE 10 MG PO TABS: 10.0000 mg | ORAL_TABLET | Freq: Every day | ORAL | 3 refills | Status: DC

## 2022-06-20 MED ORDER — FUROSEMIDE 40 MG PO TABS: 40.0000 mg | ORAL_TABLET | Freq: Every morning | ORAL | 3 refills | Status: DC

## 2022-06-20 NOTE — Progress Notes (Addendum)
Rounding Note    Patient Name: Kathryn Jimenez Date of Encounter: 06/20/2022  [place] Cardiologist: Dorris Carnes, MD   Subjective   No acute overnight events. Patient is feeling much better. She states she feels better overall since weight is down about 30 lbs from where it was. She weighed 312 lbs today which is down from around 340 lbs a couple of months ago. She note improvement in joint pain with walking and shortness of breath with this. She continues to have a cough but this is better than before as well. No recurrent chest pain or palpitations. No recurrent SVT seen on telemetry.  Inpatient Medications    Scheduled Meds:  buPROPion  150 mg Oral Daily   clopidogrel  75 mg Oral Daily   cycloSPORINE  1 drop Both Eyes BID   enoxaparin (LOVENOX) injection  40 mg Subcutaneous Q24H   ezetimibe  10 mg Oral Daily   FLUoxetine  40 mg Oral Daily   furosemide  40 mg Oral q AM   insulin aspart  0-15 Units Subcutaneous Q4H   insulin glargine-yfgn  15 Units Subcutaneous QHS   losartan  50 mg Oral Daily   metoprolol tartrate  25 mg Oral BID   nystatin   Topical TID   oxybutynin  10 mg Oral QHS   potassium chloride  20 mEq Oral Daily   prazosin  1 mg Oral QHS   sodium chloride flush  3 mL Intravenous Q12H   valACYclovir  500 mg Oral Daily   Continuous Infusions:  sodium chloride     PRN Meds: sodium chloride, acetaminophen, LORazepam, ondansetron (ZOFRAN) IV, sodium chloride flush   Vital Signs    Vitals:   06/20/22 0000 06/20/22 0100 06/20/22 0200 06/20/22 0326  BP: (!) 111/54 (!) 117/53 (!) 116/45 138/73  Pulse: 63 62 66 65  Resp:    17  Temp:    98.6 F (37 C)  TempSrc:    Oral  SpO2: 94%   92%  Weight:    (!) 141.5 kg    Intake/Output Summary (Last 24 hours) at 06/20/2022 0846 Last data filed at 06/19/2022 2021 Gross per 24 hour  Intake 75.21 ml  Output --  Net 75.21 ml      06/20/2022    3:26 AM 06/19/2022    4:50 AM 06/19/2022   12:24 AM  Last  3 Weights  Weight (lbs) 312 lb 314 lb 9.5 oz 314 lb 9.5 oz  Weight (kg) 141.522 kg 142.7 kg 142.7 kg      Telemetry    Normal sinus rhythm with rates mostly in the 50s to 70s (occasionally in the 90s but suspect this is when she is ambulating). Some PVCs noted. - Personally Reviewed  ECG    No new ECG tracing today. - Personally Reviewed  Physical Exam   GEN: Morbidly obese Caucasian female. No acute distress.   Neck: JVD difficult to assess due to body habitus. Cardiac: RRR. No murmurs, rubs, or gallops. Right radial cath site soft with no signs of hematoma. Respiratory: Clear to auscultation bilaterally. No wheezes, rhonchi, or rales. GI: Soft, obese, and non-tender. MS: Trace lower extremity edema bilaterally. No deformity. Neuro:  No focal deficits. Psych: Normal affect. Responds appropriately.  Labs    High Sensitivity Troponin:  No results for input(s): "TROPONINIHS" in the last 720 hours.   Chemistry Recent Labs  Lab 06/19/22 0857 06/19/22 1719 06/19/22 1724 06/19/22 1727 06/20/22 0108  NA 134*   < >  136 137 135  K 4.8   < > 4.0 3.8 4.1  CL 98  --   --   --  102  CO2 24  --   --   --  25  GLUCOSE 132*  --   --   --  166*  BUN 16  --   --   --  16  CREATININE 0.88  --   --   --  0.86  CALCIUM 9.1  --   --   --  8.7*  PROT 7.7  --   --   --   --   ALBUMIN 3.0*  --   --   --   --   AST 23  --   --   --   --   ALT 24  --   --   --   --   ALKPHOS 86  --   --   --   --   BILITOT 0.7  --   --   --   --   GFRNONAA >60  --   --   --  >60  ANIONGAP 12  --   --   --  8   < > = values in this interval not displayed.    Lipids No results for input(s): "CHOL", "TRIG", "HDL", "LABVLDL", "LDLCALC", "CHOLHDL" in the last 168 hours.  Hematology Recent Labs  Lab 06/19/22 0857 06/19/22 1719 06/19/22 1724 06/19/22 1727 06/20/22 0108  WBC 9.1  --   --   --  7.6  RBC 4.68  --   --   --  4.26  HGB 13.0   < > 12.6 11.9* 11.7*  HCT 40.6   < > 37.0 35.0* 37.2  MCV 86.8   --   --   --  87.3  MCH 27.8  --   --   --  27.5  MCHC 32.0  --   --   --  31.5  RDW 15.2  --   --   --  15.2  PLT 470*  --   --   --  382   < > = values in this interval not displayed.   Thyroid No results for input(s): "TSH", "FREET4" in the last 168 hours.  BNPNo results for input(s): "BNP", "PROBNP" in the last 168 hours.  DDimer No results for input(s): "DDIMER" in the last 168 hours.   Radiology    CARDIAC CATHETERIZATION  Result Date: 06/19/2022   Ost LAD to Prox LAD lesion is 30% stenosed.   Mid RCA lesion is 40% stenosed.   RPDA lesion is 90% stenosed. 1.  Widely patent left main with no stenosis 2.  Mild nonobstructive proximal LAD stenosis without any high-grade coronary stenoses throughout the LAD distribution 3.  Patent circumflex with no stenosis 4.  Nonobstructive mid RCA stenosis and severe right PDA stenosis, PDA disease distribution is in a distal portion of the vessel not amenable to PCI 5.  Mild pulmonary hypertension with PA pressure of 55/16 mean 33 mmHg, LVEDP 13 mmHg, mean wedge pressure 19 mmHg Recommendations: Medical therapy    Cardiac Studies   Echocardiogram 05/22/2022 ([facility]): Summary: 1. The left ventricle is normal in size with mildly increased wall  thickness.    2. The left ventricular systolic function is normal, LVEF is visually  estimated at 60-65%.    3. There is grade II diastolic dysfunction (elevated filling pressure).    4. The left atrium is  mildly dilated in size.    5. The right ventricle is mildly dilated in size, with normal systolic  function.    6. IVC size and inspiratory change suggest mildly elevated right atrial  pressure. (5-10 mmHg).  _______________  Right/ Left Cardiac Catheterization 06/19/2022:   Ost LAD to Prox LAD lesion is 30% stenosed.   Mid RCA lesion is 40% stenosed.   RPDA lesion is 90% stenosed.   1.  Widely patent left main with no stenosis 2.  Mild nonobstructive proximal LAD stenosis without any  high-grade coronary stenoses throughout the LAD distribution 3.  Patent circumflex with no stenosis 4.  Nonobstructive mid RCA stenosis and severe right PDA stenosis, PDA disease distribution is in a distal portion of the vessel not amenable to PCI 5.  Mild pulmonary hypertension with PA pressure of 55/16 mean 33 mmHg, LVEDP 13 mmHg, mean wedge pressure 19 mmHg   Recommendations: Medical therapy  Diagnostic Dominance: Right       Patient Profile     51 y.o. female with a history of hypertension, hyperlipidemia, type 2 diabetes mellitus, CVA in 2018, morbid obesity with BMI of 50, PTSD, and borderline personality disorder who presented to [facility] ED on 06/15/2022 for further evaluation of palpitations and SVT. She was treated with Adenosine in the field with restoration of normal sinus rhythm. High-sensitivity troponin was noted to be elevated and peaked at 1,254. Therefore, she was transferred to [facility] for possible cardiac catheterization.   Assessment & Plan    Chest Pain CAD Type II NSTEMI Patient presented with palpitations and chest pain and was found to be in SVT. Symptoms resolved with return of sinus rhythm. Work-up revealed elevated troponin. High-sensitivity troponin 179 >> 1,056 >> 1,254 >>  773 >> 377 >> 377. Recent Echo on 05/22/2022 during an admission for pneumonia showed normal LV function. Troponin elevation suspected to be more demand ischemia in setting of SVT rather than true NSTEMI but cardiac catheterization was recommended given significant risk factors and dyspnea on exertion. R/LHC on 06/19/2022 showed 90% stenosis of RPDA (not amenable to PCI) and mild non-obstructive disease of ostial to proximal LAD and mid RCA. Also showed LVEDP 13 mmHg and findings mild pulmonary hypertension. Medical therapy was recommended.  - Continue Plavix '75mg'$  daily which patient has been taking for stroke. - Started on Lopressor '25mg'$  twice daily this admission. Will switch to  Toprol-XL '50mg'$  to make it easier for her at discharge since it looks like most of her medications are once daily a home. - Previously intolerant to statins. Started on Zetia '10mg'$  daily this admission and recommend outpatient referral to [facility] for consideration of PCSK9 inhibitor.  Paroxysmal SVT Presented in SVT with rates as high as the 190s. Received Adenosine in the field and converted to sinus rhythm. - Maintaining sinus rhythm this morning.  - Started on Lopressor '25mg'$  twice daily this admission. Will switch to Toprol-XL '50mg'$  to make it easier for her at discharge since it looks like most of her medications are once daily a home. - Recommend Kardia Mobile device at home but it sounds like this may be cost prohibitive. If she reports recurrent palpitations at follow-up visit, can consider outpatient monitor.   Possible Mild Acute on Chronic Diastolic CHF [facility]BNP at [facility] ED elevated at 663 >> 1,027 >> 291. Chest x-ray showed stable cardiomegaly with bilateral interstitial and patchy aveolar airspace opacities but no focal consolidation. Recent Echo 05/22/2022 during an admission for pneumonia showed  LVEFof 60-65% with grade 2 diastolic dysfunction. She was given IV Lasix and then was restarted on home PO Lasix. LVEDP 13 mmHg on cardiac catheterization. - Does not appear grossly volume overloaded on exam. - Continue home PO Lasix '40mg'$  daily. Can take an extra dose as needed for weight gain (3lbs in 1 day or 5lbs in 1 week). - Can resume home Jardiance '10mg'$  daily.   Hypertension BP mostly well controlled. - Continue Losartan '50mg'$  daily. - Will switch to Toprol-XL '50mg'$  daily.   Hyperlipidemia Lipid panel in 03/2022: Total Cholesterol 188, Triglycerides 185, HDL 39, LDL 116. LDL goal < 55. - Previously intolerant to statins.  - Continue Zetia '10mg'$  daily (which was started yesterday). Of note, patient states she has tried this before but it did not help much and she also said it  made her feel sick (although she was unable to really elaborate on this). She is willing to retry it. - Will also place outpatient referral to [facility] for consideration of PCSK9 inhibitor.   Type 2 Diabetes Mellitus Hemoglobin A1c 6.9% this admission.  - Management per primary team.   History of Stroke History of stroke in 2018. - On Plavix '75mg'$  daily. - Previously intolerant to statins.  - Will start Zetia '10mg'$  daily and place outpatient referral to [facility] for consideration of PCSK9 inhibitor.  Disposition: Patient is stable and OK for discharge from a cardiac standpoint. Will arrange follow-up. Medication recommendations as above. MD to follow with additional recommendations.  For questions or updates, please contact [place] Please consult [email or web address] for contact info under        Signed, Darreld Mclean, PA-C  06/20/2022, 8:46 AM    Patient seen and examined  I agree with findngs as noted above by Virgie Dad PT feeling better this am  No CP ON exam Lungs CTA Cardiac RRR Ext   Tr edema  Cath as noted above   Diffuse dz in PDA   May explain trp bump when HR 190  Plan for aggressive risk factor modification  WIl try to help pt get Kardia device to capture Palpitations in future, look for SVT  OK to d/c today  Dorris Carnes MD

## 2022-06-20 NOTE — Consult Note (Signed)
   [facility] CM Inpatient Consult   06/20/2022  Kathryn Jimenez 1970-08-27 [id number]  [place] [facility] [ACO] Patient: Kathryn Jimenez  Primary Care Provider:  Ivy Lynn, NP with [place] which is listed for the post hospital follow up for transition   Patient screened for hospitalization with noted in observation status but unit progression discussion for transportation issues as barrier for transitioning. Reviewed  to assess for potential [facility] service needs for post hospital transition.    Met with patient at the bedside, she endorses PCP and states her transportation and oxygen needs has been fixed.  She agrees to having follow up with PCP office.  Explained for education and care coordination support with [facility] CM team. SDOH in electronic medical record reviewed for needs. Patient states no needs except getting didn't have clothes and her ride is being arranged 'just for today.'   Plan: Will make a referral request for post hospital care coordination for follow up.    For questions contact:   Natividad Brood, RN BSN [place]  [phone] business mobile phone Toll free office [phone]  *[place]  [phone] Fax number: [phone] [email or web address]

## 2022-06-20 NOTE — Progress Notes (Signed)
Oxygen has been delivered at bedside.

## 2022-06-20 NOTE — Discharge Instructions (Signed)
Post Cardiac Catheterization: NO HEAVY LIFTING OR SEXUAL ACTIVITY X 7 DAYS. NO DRIVING X 2-3 DAYS. NO SOAKING BATHS, HOT TUBS, POOLS, ETC., X 7 DAYS. _______________  Radial Site Care Refer to this sheet in the next few weeks. These instructions provide you with information on caring for yourself after your procedure. Your caregiver may also give you more specific instructions. Your treatment has been planned according to current medical practices, but problems sometimes occur. Call your caregiver if you have any problems or questions after your procedure. HOME CARE INSTRUCTIONS You may shower the day after the procedure. Remove the bandage (dressing) and gently wash the site with plain soap and water. Gently pat the site dry.  Do not apply powder or lotion to the site.  Do not submerge the affected site in water for 3 to 5 days.  Inspect the site at least twice daily.  Do not flex or bend the affected arm for 24 hours.  No lifting over 5 pounds (2.3 kg) for 5 days after your procedure.  Do not drive home if you are discharged the same day of the procedure. Have someone else drive you.  What to expect: Any bruising will usually fade within 1 to 2 weeks.  Blood that collects in the tissue (hematoma) may be painful to the touch. It should usually decrease in size and tenderness within 1 to 2 weeks.  SEEK IMMEDIATE MEDICAL CARE IF: You have unusual pain at the radial site.  You have redness, warmth, swelling, or pain at the radial site.  You have drainage (other than a small amount of blood on the dressing).  You have chills.  You have a fever or persistent symptoms for more than 72 hours.  You have a fever and your symptoms suddenly get worse.  Your arm becomes pale, cool, tingly, or numb.  You have heavy bleeding from the site. Hold pressure on the site.  _______________   Heart Failure Education: Weigh yourself EVERY morning after you go to the bathroom but before you eat or drink  anything. Write this number down in a weight log/diary. If you gain 3 pounds overnight or 5 pounds in a week, you can take an extra dose of Lasix '40mg'$  daily. If you continue to gain weight with extra dose of Lasix, please call our office. Take your medicines as prescribed. If you have concerns about your medications, please call us before you stop taking them.  Eat low salt foods--Limit salt (sodium) to 2000 mg per day. This will help prevent your body from holding onto fluid. Read food labels as many processed foods have a lot of sodium, especially canned goods and prepackaged meats. If you would like some assistance choosing low sodium foods, we would be happy to set you up with a nutritionist. Limit all fluids for the day to less than 2 liters (64 ounces). Fluid includes all drinks, coffee, juice, ice chips, soup, jello, and all other liquids. Stay as active as you can everyday. Staying active will give you more energy and make your muscles stronger. Start with 5 minutes at a time and work your way up to 30 minutes a day. Break up your activities--do some in the morning and some in the afternoon. Start with 3 days per week and work your way up to 5 days as you can.  If you have chest pain, feel short of breath, dizzy, or lightheaded, STOP. If you don't feel better after a short rest, call 911. If you  do feel better, call the office to let us know you have symptoms with exercise.

## 2022-06-20 NOTE — TOC Transition Note (Signed)
Transition of Care [facility]) - CM/SW Discharge Note   Patient Details  Name: Kathryn Jimenez MRN: [id number] Date of Birth: 1970-10-21  [facility] CM/SW Contact:  Zenon Mayo, RN Phone Number: 06/20/2022, 12:06 PM   Clinical Narrative:    Patient is for dc today, she states her niece will be transporting her home today after 12 noon, Huey Romans is to bring oxygen tank for her to go home with.          Patient Goals and CMS Choice        Discharge Placement                       Discharge Plan and Services                                     Social Determinants of Health (SDOH) Interventions     Readmission Risk Interventions     No data to display

## 2022-06-20 NOTE — Discharge Summary (Signed)
Physician Discharge Summary   Patient: Kathryn Jimenez MRN: [id number] DOB: Oct 08, 1970  Admit date:     06/18/2022  Discharge date: 06/20/22  Discharge Physician: Edwin Dada   PCP: Ivy Lynn, NP     Recommendations at discharge:  Follow up with Cardiology Dr. Harrington Challenger in 1 week for diastolic CHF flare and SVT Follow up with PCP Jac Canavan in 1 week     Discharge Diagnoses: Principal Problem:   Acute on chronic diastolic congestive heart failure Active Problems:   SVT (supraventricular tachycardia)   Demand ischemia   T2DM (type 2 diabetes mellitus) ([place])   Essential hypertension   Morbid obesity with BMI of 50.0-59.9, adult (HCC)   OSA (obstructive sleep apnea)     Hospital Course: Kathryn Jimenez is a 51 y.o. F with obesity, HTN and HLD and dCHF who prestened with chest pain and palpitations to OSH.  At the OSH, her troponin peaked at 1200 and so she was transferred for Cardiology evaluation.      * Demand ischemia, type 2 NSTEMI  High sensitivity troponin elevated.  Cardiology consulted and underwent heart cath without obstructive disease.     SVT (supraventricular tachycardia) Patient with SVT noted here.  Started on new metoprolol  Acute on chronic diastolic CHF Treated with IV Lasix and diuresed 30 lbs from her prior weight, swelling and dyspnea on exertion improved.  OSA (obstructive sleep apnea) Unable to tolerate CPAP.  Morbid obesity with BMI of 50.0-59.9, adult (Kathryn Jimenez)   Essential hypertension  T2DM (type 2 diabetes mellitus) (Kathryn Jimenez)            The [facility] Controlled Substances Registry was reviewed for this patient prior to discharge.   Consultants: Cardiology Procedures performed: Left heart cath  Disposition: Home Diet recommendation:  Discharge Diet Orders (From admission, onward)     Start     Ordered   06/20/22 0000  Diet - low sodium heart healthy        06/20/22 1135             DISCHARGE  MEDICATION: Allergies as of 06/20/2022       Reactions   Actos [pioglitazone] Swelling   Augmentin [amoxicillin-pot Clavulanate] Diarrhea, Nausea Only, Other (See Comments)   Caused severe diarrhea   Citrus Other (See Comments)   "Issues with my tongue"- the fleshy part of the fruit   Invokana [canagliflozin] Other (See Comments)   Yeast infection   Lopid [gemfibrozil] Other (See Comments)   Made the tongue burn   Metoprolol Other (See Comments)   Headaches   Naproxen Sodium Other (See Comments)   Patient states she "felt loopy"   Penicillins Diarrhea, Other (See Comments)   Violent diarrhea- must have Diflucan   Pineapple Other (See Comments)   "Issues with my tongue"   Statins Other (See Comments)   Joint pain   Tape Itching, Other (See Comments)   Tape, EKG leads, and Band-Aids - Itching and redness   Amlodipine Cough   Clavulanic Acid Nausea Only   Hctz [hydrochlorothiazide] Rash   Lisinopril Cough   Morphine And Related Itching, Rash        Medication List     TAKE these medications    acetaminophen 500 MG tablet Commonly known as: TYLENOL Take 500 mg by mouth every 6 (six) hours as needed for mild pain or moderate pain.   albuterol 108 (90 Base) MCG/ACT inhaler Commonly known as: VENTOLIN HFA 2 PUFFS INTO THE LUNGS EVERY 6 HOURS  AS NEEDED FOR WHEEZING OR SHORTNESS OF BREATH. What changed: See the new instructions.   benzonatate 100 MG capsule Commonly known as: Tessalon Perles Take 1 capsule (100 mg total) by mouth 3 (three) times daily as needed. What changed: reasons to take this   buPROPion 150 MG 24 hr tablet Commonly known as: WELLBUTRIN XL Take 150 mg by mouth daily.   clobetasol cream 0.05 % Commonly known as: TEMOVATE Apply 1 application topically 2 (two) times daily as needed (scalp psoriasis).   clopidogrel 75 MG tablet Commonly known as: PLAVIX Take 1 tablet (75 mg total) by mouth daily.   diphenhydrAMINE 25 mg capsule Commonly known  as: BENADRYL Take 25 mg by mouth every 6 (six) hours as needed for allergies.   ezetimibe 10 MG tablet Commonly known as: ZETIA Take 1 tablet (10 mg total) by mouth daily. Start taking on: June 21, 2022   FLUoxetine 40 MG capsule Commonly known as: PROZAC Take 40 mg by mouth daily.   furosemide 40 MG tablet Commonly known as: LASIX Take 1 tablet (40 mg total) by mouth in the morning.   guaiFENesin 600 MG 12 hr tablet Commonly known as: Mucinex Take 1 tablet (600 mg total) by mouth 2 (two) times daily. What changed:  when to take this reasons to take this   insulin aspart 100 UNIT/ML injection Commonly known as: novoLOG Inject into the skin See admin instructions. Per pump/"2.9 units per hour"   Jardiance 10 MG Tabs tablet Generic drug: empagliflozin Take 10 mg by mouth daily.   Liletta (52 MG) 20.1 MCG/DAY Iud IUD Generic drug: levonorgestrel 1 each by Intrauterine route once. Inserted 04/19/21   LORazepam 0.5 MG tablet Commonly known as: ATIVAN Take 0.25-0.5 mg by mouth daily as needed for anxiety.   losartan 50 MG tablet Commonly known as: COZAAR Take 50 mg by mouth daily.   metoprolol succinate 50 MG 24 hr tablet Commonly known as: TOPROL-XL Take 1 tablet (50 mg total) by mouth daily. Take with or immediately following a meal.   nystatin powder Commonly known as: MYCOSTATIN/NYSTOP Apply 1 application  topically daily as needed (for fungal infections).   Omnipod Classic Pods (Gen 3) Misc Inject 1 Device into the skin every other day.   OVER THE COUNTER MEDICATION Take 1-2 drops by mouth daily as needed (Thyroid Function). Potassium Iodide 4 %; Iodine 2 %; Distilled water   oxybutynin 10 MG 24 hr tablet Commonly known as: Ditropan XL Take 1 tablet (10 mg total) by mouth at bedtime.   OXYGEN Inhale 4 L/min into the lungs continuous.   potassium chloride 10 MEQ tablet Commonly known as: KLOR-CON Take 2 tablets (20 mEq total) by mouth daily.    prazosin 1 MG capsule Commonly known as: MINIPRESS Take 1 capsule (1 mg total) by mouth at bedtime.   Prevail IB Full Mat Brief 2XL Misc 1 each by Does not apply route every 2 (two) hours as needed.   Restasis 0.05 % ophthalmic emulsion Generic drug: cycloSPORINE Place 1 drop into both eyes 2 (two) times daily.   valACYclovir 500 MG tablet Commonly known as: VALTREX Take 1 tablet (500 mg total) by mouth daily.   Vitamin D (Ergocalciferol) 1.25 MG (50000 UNIT) Caps capsule Commonly known as: DRISDOL Take 1 capsule (50,000 Units total) by mouth 2 (two) times a week. What changed: when to take this        Follow-up Information     Darreld Mclean, PA-C Follow up.  Specialties: Physician Assistant, Cardiology Why: Hospital follow-up scheduled for 06/29/2022 at 2:45pm at our [facility] in [place]. Please arrive 15 minute early for check-in. If this date/time does not work for you, please call our office to reschedule. Contact information: [address] [place] [address] [phone]         Ivy Lynn, NP Follow up.   Specialty: Nurse Practitioner Contact information: [place] [address] [phone]                 Discharge Instructions     AMB Referral to Advanced Lipid Disorders Clinic   Complete by: As directed    Reason for referral: Patients with statin intolerance (failed 2 statins, one of which must be a high potency statin) Comment - possible PCSK9 inhibitor   Provider to see patient: PharmD   Internal Lipid Clinic Referral Scheduling  Internal lipid clinic referrals are providers within [facility], who wish to refer established patients for routine management (help in starting PCSK9 inhibitor therapy) or advanced therapies.  Internal MD referral criteria:              1. All patients with LDL>190 mg/dL  2. All patients with Triglycerides >500 mg/dL  3. Patients with suspected or confirmed  heterozygous familial hyperlipidemia (HeFH) or homozygous familial hyperlipidemia (HoFH)  4. Patients with family history of suspicious for genetic dyslipidemia desiring genetic testing  5. Patients refractory to standard guideline based therapy  6. Patients with statin intolerance (failed 2 statins, one of which must be a high potency statin)  7. Patients who the provider desires to be seen by MD   Internal PharmD referral criteria:   1. Follow-up patients for medication management  2. Follow-up for compliance monitoring  3. Patients for drug education  4. Patients with statin intolerance  5. PCSK9 inhibitor education and prior authorization approvals  6. Patients with triglycerides <500 mg/dL  External Lipid Clinic Referral  External lipid clinic referrals are for providers outside of [place], considered new clinic patients - automatically routed to MD schedule   Diet - low sodium heart healthy   Complete by: As directed    Discharge instructions   Complete by: As directed    **IMPORTANT DISCHARGE INSTRUCTIONS**    From Dr. Loleta Books: You were admitted for fluid overload and fast heart rate. The fast heart rate is called a "supraventricular tachycardia" or SVT  To manage this, make sure your fluid status is well controlled (by taking your Lasix and measuring your weight daily and limiting to 2 liters fluid intake per day as we discussed) ALSO, take the heart rate slowing medicine metoprolol  Take metoprolol/Toprol XL 50 mg daily   For your cholesterol, we have sent a referral to the advanced lipids clinic. Also, start the medicine ezetimibe/Zetia  Continue your other home medicines: Oxybutynin Prazosin Furosemide Jardiance Plavix Insulin pump   Go see your primary care in 1 week Go see Dr. Alan Ripper office in 2 weeks (see below in To Do section, appointment with Sande Rives)   Increase activity slowly   Complete by: As directed        Discharge  Exam: Filed Weights   06/19/22 0024 06/19/22 0450 06/20/22 0326  Weight: (!) 142.7 kg (!) 142.7 kg (!) 141.5 kg    General: Pt is alert, awake, not in acute distress Cardiovascular: RRR, nl S1-S2, no murmurs appreciated.   No LE edema.   Respiratory: Normal respiratory rate and rhythm.  CTAB without rales or wheezes. Abdominal: Abdomen soft and non-tender.  No distension or HSM.   Neuro/Psych: Strength symmetric in upper and lower extremities.  Judgment and insight appear normal.   Condition at discharge: good  The results of significant diagnostics from this hospitalization (including imaging, microbiology, ancillary and laboratory) are listed below for reference.   Imaging Studies: CARDIAC CATHETERIZATION  Result Date: 06/19/2022   Ost LAD to Prox LAD lesion is 30% stenosed.   Mid RCA lesion is 40% stenosed.   RPDA lesion is 90% stenosed. 1.  Widely patent left main with no stenosis 2.  Mild nonobstructive proximal LAD stenosis without any high-grade coronary stenoses throughout the LAD distribution 3.  Patent circumflex with no stenosis 4.  Nonobstructive mid RCA stenosis and severe right PDA stenosis, PDA disease distribution is in a distal portion of the vessel not amenable to PCI 5.  Mild pulmonary hypertension with PA pressure of 55/16 mean 33 mmHg, LVEDP 13 mmHg, mean wedge pressure 19 mmHg Recommendations: Medical therapy    Microbiology: Results for orders placed or performed in visit on 03/07/21  Microscopic Examination     Status: Abnormal   Collection Time: 03/07/21  9:28 AM   Urine  Result Value Ref Range Status   WBC, UA None seen 0 - 5 /hpf Final   RBC, Urine >30 (A) 0 - 2 /hpf Final   Epithelial Cells (non renal) 0-10 0 - 10 /hpf Final   Mucus, UA Present (A) Not Estab. Final   Bacteria, UA Few (A) None seen/Few Final  Urine Culture     Status: None   Collection Time: 03/07/21 10:11 AM   Specimen: Urine   UR  Result Value Ref Range Status   Urine Culture,  Routine Final report  Final   Organism ID, Bacteria Comment  Final    Comment: Mixed urogenital flora Less than 10,000 colonies/mL     Labs: CBC: Recent Labs  Lab 06/19/22 0857 06/19/22 1719 06/19/22 1724 06/19/22 1727 06/20/22 0108  WBC 9.1  --   --   --  7.6  NEUTROABS 4.2  --   --   --  3.8  HGB 13.0 12.6 12.6 11.9* 11.7*  HCT 40.6 37.0 37.0 35.0* 37.2  MCV 86.8  --   --   --  87.3  PLT 470*  --   --   --  253   Basic Metabolic Panel: Recent Labs  Lab 06/19/22 0857 06/19/22 1719 06/19/22 1724 06/19/22 1727 06/20/22 0108  NA 134* 138 136 137 135  K 4.8 4.0 4.0 3.8 4.1  CL 98  --   --   --  102  CO2 24  --   --   --  25  GLUCOSE 132*  --   --   --  166*  BUN 16  --   --   --  16  CREATININE 0.88  --   --   --  0.86  CALCIUM 9.1  --   --   --  8.7*   Liver Function Tests: Recent Labs  Lab 06/19/22 0857  AST 23  ALT 24  ALKPHOS 86  BILITOT 0.7  PROT 7.7  ALBUMIN 3.0*   CBG: Recent Labs  Lab 06/19/22 2120 06/20/22 0025 06/20/22 0422 06/20/22 0844 06/20/22 1116  GLUCAP 267* 170* 157* 258* 183*    Discharge time spent: approximately 35 minutes spent on discharge counseling, evaluation of patient on day of discharge, and coordination of discharge planning  with nursing, social work, pharmacy and case management  Signed: Edwin Dada, MD [facility] 06/20/2022

## 2022-06-20 NOTE — TOC Progression Note (Signed)
Transition of Care [facility]) - Progression Note    Patient Details  Name: Malayla Sergent MRN: [id number] Date of Birth: Apr 23, 1971  Transition of [facility]) CM/SW Contact  Zenon Mayo, RN Phone Number: 06/20/2022, 10:29 AM  Clinical Narrative:    Patient has home oxygen with [employer], she states her spouse does not drive and she needs oxygen to go home with.  NCM contacted [employer], they will bring a e tank for her to go home with today. She states they will be here in 2 hours to bring oxygen tank.  Patient also states she will check with another family member to see if they could transport her home, once oxygen is received.         Expected Discharge Plan and Services                                                 Social Determinants of Health (SDOH) Interventions    Readmission Risk Interventions     No data to display

## 2022-06-21 LAB — LIPOPROTEIN A (LPA): Lipoprotein (a): 51.2 nmol/L — ABNORMAL HIGH (ref <75.0)

## 2022-06-21 MED FILL — Heparin Sod (Porcine)-NaCl IV Soln 1000 Unit/500ML-0.9%: INTRAVENOUS | Qty: 1000 | Status: AC

## 2022-06-25 ENCOUNTER — Telehealth: Payer: Self-pay | Admitting: Nurse Practitioner

## 2022-06-25 DIAGNOSIS — R0902 Hypoxemia: Secondary | ICD-10-CM | POA: Diagnosis not present

## 2022-06-25 DIAGNOSIS — I509 Heart failure, unspecified: Secondary | ICD-10-CM | POA: Diagnosis not present

## 2022-06-25 DIAGNOSIS — J189 Pneumonia, unspecified organism: Secondary | ICD-10-CM | POA: Diagnosis not present

## 2022-06-25 NOTE — Progress Notes (Signed)
Cardiology Office Note:    Date:  06/25/2022   ID:  Kathryn Jimenez, DOB Apr 23, 1971, MRN [id number]  PCP:  Ivy Lynn, NP  Cardiologist:  Dorris Carnes, MD  Electrophysiologist:  None   Referring MD: Ivy Lynn, NP   Chief Complaint: hospital follow-up of SVT  History of Present Illness:    Kathryn Jimenez is a 51 y.o. female with a history of CAD with high grade RPDA disease (not amenable to PCI) but otherwise only mild non-obstructive disease noted on recent cardiac catheterization on 06/19/2022 , chronic diastolic CHF, paroxysmal SVT, hypertension, hyperlipidemia, type 2 diabetes mellitus, CVA in 2018, morbid obesity with BMI of 50, PTSD, and borderline personality disorder who is followed by Dr. Harrington Challenger and presents today for hospital follow-up of SVT.  Patient was first seen by Cardiology during recent admission from 06/18/2022 to 06/20/2022 for SVT after presenting with palpitations and atypical chest pain. She received Adenosine in the field and thankfully converted to normal sinus rhythm. She also reported some atypical chest pain that sounded musculoskeletal in nature as well as some shortness of breath. High-sensitivity troponin peaked at 1,254. Pro-BNP was elevated and peaked at 1,027. She had recently been admitted earlier in the month at [facility] for pneumonia. Echo on 05/22/2022 during that admission showed LVEF of 60-65% with grade 2 diastolic dysfunction, mildly dilated RV with normal systolic function, mildly elevated right atrial pressure, and no significant valvular disease. She was treated with IV Lasix in the ED with some improvement in her shortness of breath and then was resumed on her home PO Lasix. Given degree of troponin elevation and multiple CV risk factors, decision was made to proceed with [facility] which showed 90% stenosis of distal RPDA (not amenable to PCI) and otherwise only mild non-obstructive CAD. Also showed mild pulmonary hypertension and LVEDP of 13 mmHg.  Medical therapy was recommended. Troponin elevation was felt to be due to type 2 NSTEMI (demand ischemia) in setting of SVT and underlying high grade RPDA disease. She was started on Metoprolol and thankfully had no recurrent SVT. LDL was 116. She has a history of being intolerant to statins so she was started on Zetia and referred to the lipid clinic for consideration of PCSK9 inhibitor.  Patient presents today for follow-up. Doing well. Still on o2 palpitations when anxious. No chest pain. Shortness of breaht improving. Still on 2 L of o2.    Past Medical History:  Diagnosis Date   Allergy    Anginal pain ([place])    Anxiety    Arthritis    Asthma    Bladder disorder, other    Borderline personality disorder ([place])    Carotid artery disease ([place]) 03/05/2017   Depression    Diabetes mellitus without complication (HCC)    GERD (gastroesophageal reflux disease)    Headache    Hyperlipidemia    Hypertension    Kidney stones    Mucous retention cyst of maxillary sinus 08/31/2021   Noted on head CT   Neuromuscular disorder (HCC)    Neuropathy   PTSD (post-traumatic stress disorder)    Shortness of breath    Sleep apnea    Stroke ([place]) 2018   Thyroid disease    nodule    Past Surgical History:  Procedure Laterality Date   CARPAL TUNNEL RELEASE     left 2015   CERVICAL FUSION Bilateral 2003   C 5-6 with titanium screws   CHOLECYSTECTOMY     COLONOSCOPY WITH PROPOFOL  N/A 06/24/2014   Procedure: COLONOSCOPY WITH PROPOFOL;  Surgeon: Rogene Houston, MD;  Location: [facility];  Service: Endoscopy;  Laterality: N/A;  cecum time in 0810    time out   0821    total time 11 minutes   DILITATION & CURRETTAGE/HYSTROSCOPY WITH HYDROTHERMAL ABLATION N/A 04/19/2021   Procedure: DILATATION & CURETTAGE/HYSTEROSCOPY WITH HYDROTHERMAL ABLATION;  Surgeon: Janyth Pupa, DO;  Location: [facility];  Service: Gynecology;  Laterality: N/A;   INTRAUTERINE DEVICE (IUD) INSERTION N/A 04/19/2021   Procedure:  INTRAUTERINE DEVICE (IUD) INSERTION;  Surgeon: Janyth Pupa, DO;  Location: [facility];  Service: Gynecology;  Laterality: N/A;   POLYPECTOMY N/A 06/24/2014   Procedure: RECTAL POLYPECTOMY;  Surgeon: Rogene Houston, MD;  Location: [facility];  Service: Endoscopy;  Laterality: N/A;   RIGHT/LEFT HEART CATH AND CORONARY ANGIOGRAPHY N/A 06/19/2022   Procedure: RIGHT/LEFT HEART CATH AND CORONARY ANGIOGRAPHY;  Surgeon: Sherren Mocha, MD;  Location: [facility];  Service: Cardiovascular;  Laterality: N/A;   [place] EXTRACTION Bilateral 2001 and 1990s   top and bottom     Current Medications: No outpatient medications have been marked as taking for the 06/29/22 encounter (Appointment) with Darreld Mclean, PA-C.     Allergies:   Actos [pioglitazone], Augmentin [amoxicillin-pot clavulanate], Citrus, Invokana [canagliflozin], Lopid [gemfibrozil], Metoprolol, Naproxen sodium, Penicillins, Pineapple, Statins, Tape, Amlodipine, Clavulanic acid, Hctz [hydrochlorothiazide], Lisinopril, and Morphine and related   Social History   Socioeconomic History   Marital status: Married    Spouse name: [name]   Number of children: 0   Years of education: HS   Highest education level: Not on file  Occupational History   Occupation: Disabled  Tobacco Use   Smoking status: Never   Smokeless tobacco: Never  Vaping Use   Vaping Use: Never used  Substance and Sexual Activity   Alcohol use: No    Comment: Rarely   Drug use: No   Sexual activity: Not Currently    Birth control/protection: I.U.D.  Other Topics Concern   Not on file  Social History Narrative   Lives at home with husband.   Right-handed.   3-4 cups caffeine per day.   Social Determinants of Health   Financial Resource Strain: Medium Risk (04/03/2021)   Overall Financial Resource Strain (CARDIA)    Difficulty of Paying Living Expenses: Somewhat hard  Food Insecurity: Food Insecurity  Present (04/03/2021)   Hunger Vital Sign    Worried About Running Out of Food in the Last Year: Sometimes true    Ran Out of Food in the Last Year: Never true  Transportation Needs: No Transportation Needs (04/03/2021)   PRAPARE - Hydrologist (Medical): No    Lack of Transportation (Non-Medical): No  Physical Activity: Insufficiently Active (04/03/2021)   Exercise Vital Sign    Days of Exercise per Week: 1 day    Minutes of Exercise per Session: 10 min  Stress: No Stress Concern Present (04/03/2021)   [place]    Feeling of Stress : Only a little  Social Connections: Moderately Isolated (04/03/2021)   Social Connection and Isolation Panel [NHANES]    Frequency of Communication with Friends and Family: Once a week    Frequency of Social Gatherings with Friends and Family: Once a week    Attends Religious Services: 1 to 4 times per year  Active Member of Clubs or Organizations: No    Attends Archivist Meetings: Never    Marital Status: Married     Family History: The patient's family history includes Alcohol abuse in her paternal grandfather and paternal grandmother; Arthritis in her father and mother; Asthma in her mother; Birth defects in her sister; Breast cancer in her sister and sister; COPD in her mother and sister; Colon cancer in her sister and sister; Depression in her father and mother; Diabetes in her mother and paternal grandmother; Drug abuse in her father; Early death in her father; Heart disease in her mother; Hyperlipidemia in her mother and sister; Hypertension in her mother; Kidney disease in her father; Learning disabilities in her sister; Lung cancer in her father; Mental illness in her father and mother; Skin cancer in her mother.  ROS:   Please see the history of present illness.     EKGs/Labs/Other Studies Reviewed:    The following studies were reviewed  today:  Echocardiogram 05/22/2022 ([place]): Summary: 1. The left ventricle is normal in size with mildly increased wall  thickness.    2. The left ventricular systolic function is normal, LVEF is visually  estimated at 60-65%.    3. There is grade II diastolic dysfunction (elevated filling pressure).    4. The left atrium is mildly dilated in size.    5. The right ventricle is mildly dilated in size, with normal systolic  function.    6. IVC size and inspiratory change suggest mildly elevated right atrial  pressure. (5-10 mmHg).  _______________   Right/ Left Cardiac Catheterization 06/19/2022:   Ost LAD to Prox LAD lesion is 30% stenosed.   Mid RCA lesion is 40% stenosed.   RPDA lesion is 90% stenosed.   1.  Widely patent left main with no stenosis 2.  Mild nonobstructive proximal LAD stenosis without any high-grade coronary stenoses throughout the LAD distribution 3.  Patent circumflex with no stenosis 4.  Nonobstructive mid RCA stenosis and severe right PDA stenosis, PDA disease distribution is in a distal portion of the vessel not amenable to PCI 5.  Mild pulmonary hypertension with PA pressure of 55/16 mean 33 mmHg, LVEDP 13 mmHg, mean wedge pressure 19 mmHg   Recommendations: Medical therapy   Diagnostic Dominance: Right     EKG:  EKG not ordered today.   Recent Labs: 11/16/2021: Magnesium 2.0 06/19/2022: ALT 24 06/20/2022: BUN 16; Creatinine, Ser 0.86; Hemoglobin 11.7; Platelets 382; Potassium 4.1; Sodium 135  Recent Lipid Panel    Component Value Date/Time   CHOL 188 03/21/2022 1540   TRIG 185 (H) 03/21/2022 1540   HDL 39 (L) 03/21/2022 1540   CHOLHDL 4.8 (H) 03/21/2022 1540   LDLCALC 116 (H) 03/21/2022 1540    Physical Exam:    Vital Signs: There were no vitals taken for this visit.    Wt Readings from Last 3 Encounters:  06/20/22 (!) 312 lb (141.5 kg)  06/01/22 (!) 352 lb (159.7 kg)  03/21/22 (!) 349 lb (158.3 kg)     General: 51 y.o.  morbidly obese Caucasian female in no acute distress. HEENT: Normocephalic and atraumatic. Sclera clear. Neck: Supple. JVD difficult to assess due to body habitus. Heart: RRR. Distinct S1 and S2. No murmurs, gallops, or rubs.  Lungs: No increased work of breathing. Clear to ausculation bilaterally. No wheezes, rhonchi, or rales.  Abdomen: Soft, non-distended, and non-tender to palpation.  Extremities: Trace lower extremity edema bilaterally.    Skin: Warm  and dry. Neuro: Alert and oriented x3. No focal deficits. Psych: Normal affect. Responds appropriately.   Assessment:    No diagnosis found.  Plan:    CAD LHC during recent admission showed 90% stenosis of distal RPDA (not amenable to PCI) and otherwise only mild non-obstructive CAD. - No angina. - Continue Plavix '75mg'$  daily which patient has been on since time of her stroke. - Continue Toprol-XL '50mg'$  daily. - Intolerant to statins. Continue Zetia '10mg'$  daily. Has been referred to the [facility] for consideration of PCSK9 inhibitor.  Chronic Diastolic CHF Echo in 40/0867 showed LVEF of 60-65% with grade 2 diastolic dysfunction, mildly dilated RV with normal systolic function, mildly elevated right atrial pressure, and no significant valvular disease. - Volume status difficult to assess due to body habitus but does not appear grossly volume overloaded. *** - Continue Lasix '40mg'$  daily. Can take an extra '40mg'$  as needed for weight gain (3lbs in 1 day or 5lbs in 1 week). - Continue Jardiance '10mg'$  daily. - Discussed importance of daily weights and sodium/ fluid restrictions.  Paroxysmal SVT Patient was recently admitted for SVT but converted to sinus rhythm after receiving Adenosine in the field.  - *** - Continue Toprol-XL '50mg'$  daily.  Hypertension BP well controlled. *** - Continue current medications: Losartan '50mg'$  daily and Toprol-XL '50mg'$  daily.  Hyperlipidemia Lipid panel in 03/2022: Total Cholesterol 188, Triglycerides 185,  HDL 39, LDL 116. LDL goal < 55. - Previously intolerant to statins.  - Continue Zetia '10mg'$  daily which was started during recent admission. - She has been referred to the [facility] for consideration of PCSK9 inhibitor.   Type 2 Diabetes Mellitus Hemoglobin A1c 6.9% in 05/2022 during recent admission. - On Jardiance and an Insulin pump. *** - Management per PCP/ Endocrinology. ***  History of Stroke History of a stroke in 2018. - Has been maintained on Plavix monotherapy. - Intolerant to statins in the past. Continue Zetia '10mg'$  daily. Has been referred to the [facility] for consideration of PCSK9 inhibitor.  Disposition: Follow up in ***   Medication Adjustments/Labs and Tests Ordered: Current medicines are reviewed at length with the patient today.  Concerns regarding medicines are outlined above.  No orders of the defined types were placed in this encounter.  No orders of the defined types were placed in this encounter.   There are no Patient Instructions on file for this visit.   Signed, Darreld Mclean, PA-C  06/25/2022 9:43 AM     [facility]

## 2022-06-25 NOTE — Telephone Encounter (Signed)
Schedule patient  for a hospital follow up.  That's supposed to be a different type of  visit right?   Or put patient in a time slot that will cover her appointment and a hospital follow up in one. Thanks

## 2022-06-26 ENCOUNTER — Ambulatory Visit: Payer: BC Managed Care – PPO | Admitting: Nurse Practitioner

## 2022-06-26 NOTE — Telephone Encounter (Signed)
Attempted to call pt , no answer pt does not have a apt with Je until 11/15

## 2022-06-29 ENCOUNTER — Encounter: Payer: Self-pay | Admitting: Student

## 2022-06-29 ENCOUNTER — Other Ambulatory Visit: Payer: Self-pay | Admitting: *Deleted

## 2022-06-29 ENCOUNTER — Ambulatory Visit: Payer: BC Managed Care – PPO | Attending: Student | Admitting: Student

## 2022-06-29 VITALS — BP 122/65 | HR 71 | Ht 66.0 in | Wt 317.6 lb

## 2022-06-29 DIAGNOSIS — Z794 Long term (current) use of insulin: Secondary | ICD-10-CM

## 2022-06-29 DIAGNOSIS — E118 Type 2 diabetes mellitus with unspecified complications: Secondary | ICD-10-CM

## 2022-06-29 DIAGNOSIS — I10 Essential (primary) hypertension: Secondary | ICD-10-CM

## 2022-06-29 DIAGNOSIS — I471 Supraventricular tachycardia, unspecified: Secondary | ICD-10-CM

## 2022-06-29 DIAGNOSIS — E785 Hyperlipidemia, unspecified: Secondary | ICD-10-CM

## 2022-06-29 DIAGNOSIS — I5032 Chronic diastolic (congestive) heart failure: Secondary | ICD-10-CM | POA: Diagnosis not present

## 2022-06-29 DIAGNOSIS — Z8673 Personal history of transient ischemic attack (TIA), and cerebral infarction without residual deficits: Secondary | ICD-10-CM

## 2022-06-29 DIAGNOSIS — I2510 Atherosclerotic heart disease of native coronary artery without angina pectoris: Secondary | ICD-10-CM

## 2022-06-29 DIAGNOSIS — G4733 Obstructive sleep apnea (adult) (pediatric): Secondary | ICD-10-CM

## 2022-06-29 DIAGNOSIS — J9611 Chronic respiratory failure with hypoxia: Secondary | ICD-10-CM

## 2022-06-29 NOTE — Patient Instructions (Addendum)
Medication Instructions:  Your physician recommends that you continue on your current medications as directed. Please refer to the Current Medication list given to you today.  *If you need a refill on your cardiac medications before your next appointment, please call your pharmacy*   Lab Work: 2 MONTHS:  GO TO [facility], FASTING, FOR LIPID & LFT  If you have labs (blood work) drawn today and your tests are completely normal, you will receive your results only by: [place] (if you have MyChart) OR A paper copy in the mail If you have any lab test that is abnormal or we need to change your treatment, we will call you to review the results.   Testing/Procedures: Your physician has recommended that you have a sleep study. This test records several body functions during sleep, including: brain activity, eye movement, oxygen and carbon dioxide blood levels, heart rate and rhythm, breathing rate and rhythm, the flow of air through your mouth and nose, snoring, body muscle movements, and chest and belly movement.    Follow-Up: At [facility], you and your health needs are our priority.  As part of our continuing mission to provide you with exceptional heart care, we have created designated Provider Care Teams.  These Care Teams include your primary Cardiologist (physician) and Advanced Practice Providers (APPs -  Physician Assistants and Nurse Practitioners) who all work together to provide you with the care you need, when you need it.  We recommend signing up for the patient portal called "MyChart".  Sign up information is provided on this After Visit Summary.  MyChart is used to connect with patients for Virtual Visits (Telemedicine).  Patients are able to view lab/test results, encounter notes, upcoming appointments, etc.  Non-urgent messages can be sent to your provider as well.   To learn more about what you can do with MyChart, go to [email or web address].    Your next  appointment:   4 month(s)  The format for your next appointment:   In Person  Provider:   Dorris Carnes, MD    Other Instructions   Important Information About Sugar

## 2022-06-30 ENCOUNTER — Encounter: Payer: Self-pay | Admitting: Student

## 2022-07-02 ENCOUNTER — Other Ambulatory Visit: Payer: Self-pay | Admitting: Nurse Practitioner

## 2022-07-02 ENCOUNTER — Telehealth: Payer: Self-pay

## 2022-07-02 ENCOUNTER — Encounter: Payer: Self-pay | Admitting: Family Medicine

## 2022-07-02 ENCOUNTER — Telehealth: Payer: Self-pay | Admitting: Nurse Practitioner

## 2022-07-02 DIAGNOSIS — B3731 Acute candidiasis of vulva and vagina: Secondary | ICD-10-CM

## 2022-07-02 MED ORDER — TERCONAZOLE 0.8 % VA CREA: 1.0000 | TOPICAL_CREAM | Freq: Every day | VAGINAL | 0 refills | Status: DC

## 2022-07-02 NOTE — Telephone Encounter (Signed)
Terazol sent

## 2022-07-02 NOTE — Telephone Encounter (Signed)
I CALLED THE PATIENT TO SPEAK WITH HER ABOUT THE ITAMAR DEVICE. I LVM FOR PATIENT TO RETURN MY CALL.

## 2022-07-02 NOTE — Telephone Encounter (Signed)
Pt is returning call. Transferred to Debria Garret, [place].

## 2022-07-02 NOTE — Telephone Encounter (Signed)
Patient aware.

## 2022-07-02 NOTE — Telephone Encounter (Signed)
NA

## 2022-07-02 NOTE — Telephone Encounter (Signed)
I SPOKE TO THE PATIENT ABOUT THE ITAMAR DEVICE. THE ISSUE HAS BEEN RESOLVED.

## 2022-07-03 ENCOUNTER — Telehealth: Payer: Self-pay

## 2022-07-03 ENCOUNTER — Telehealth: Payer: BC Managed Care – PPO

## 2022-07-03 ENCOUNTER — Telehealth: Payer: Self-pay | Admitting: *Deleted

## 2022-07-03 NOTE — Telephone Encounter (Addendum)
Prior Authorization for [employer] sent to [employer].  Per insurance no PA is required. Call Reference # [id number]. Debria Garret notified ok to activate device.

## 2022-07-03 NOTE — Telephone Encounter (Signed)
Called and made the patient aware that SHE may proceed with the [facility] Home Sleep Study. PIN # provided to the patient. Patient made aware that SHE will be contacted after the test has been read with the results and any recommendations. Patient verbalized understanding and thanked me for the call.   

## 2022-07-03 NOTE — Telephone Encounter (Signed)
I CALLED THE PATIENT TO SPEAK WITH HER ABOUT HER ITAMAR DEVICE. I LVM TO RETURN MY CALL.

## 2022-07-04 ENCOUNTER — Ambulatory Visit (INDEPENDENT_AMBULATORY_CARE_PROVIDER_SITE_OTHER): Payer: BC Managed Care – PPO | Admitting: Nurse Practitioner

## 2022-07-04 ENCOUNTER — Encounter: Payer: Self-pay | Admitting: Nurse Practitioner

## 2022-07-04 VITALS — BP 137/66 | HR 64 | Temp 98.4°F | Ht 66.0 in | Wt 325.0 lb

## 2022-07-04 DIAGNOSIS — I10 Essential (primary) hypertension: Secondary | ICD-10-CM

## 2022-07-04 DIAGNOSIS — J4520 Mild intermittent asthma, uncomplicated: Secondary | ICD-10-CM | POA: Diagnosis not present

## 2022-07-04 DIAGNOSIS — E1142 Type 2 diabetes mellitus with diabetic polyneuropathy: Secondary | ICD-10-CM | POA: Diagnosis not present

## 2022-07-04 DIAGNOSIS — E7800 Pure hypercholesterolemia, unspecified: Secondary | ICD-10-CM

## 2022-07-04 MED ORDER — ALBUTEROL SULFATE HFA 108 (90 BASE) MCG/ACT IN AERS: INHALATION_SPRAY | RESPIRATORY_TRACT | 0 refills | Status: DC

## 2022-07-04 NOTE — Assessment & Plan Note (Signed)
Hypertension well controlled on current regimen no changes needed. Continue current medication dose,follow up with cardiology, no labs needed today, patient is set up for labs in 2 months.

## 2022-07-04 NOTE — Assessment & Plan Note (Signed)
Diabetes  well controlled on current regimen, no changes necessary, continue diabetic diet, weight loss and exercise 3x a week as tolerated.

## 2022-07-04 NOTE — Progress Notes (Signed)
Established Patient Office Visit  Subjective   Patient ID: Kathryn Jimenez, female    DOB: September 10, 1970  Age: 51 y.o. MRN: [id number]  Chief Complaint  Patient presents with   Medical Management of Chronic Issues    3 month   Obesity    Hypertension This is a chronic problem. The current episode started more than 1 year ago. The problem has been gradually improving since onset. The problem is controlled. Associated symptoms include shortness of breath. Risk factors for coronary artery disease include obesity, dyslipidemia and diabetes mellitus. Past treatments include beta blockers and diuretics. The current treatment provides significant improvement. There are no compliance problems.   Hyperlipidemia This is a chronic problem. The current episode started more than 1 year ago. The problem is controlled. Recent lipid tests were reviewed and are low. Exacerbating diseases include diabetes and obesity. Associated symptoms include shortness of breath. Current antihyperlipidemic treatment includes ezetimibe. The current treatment provides significant improvement of lipids. There are no compliance problems.   Diabetes She has type 2 diabetes mellitus. The initial diagnosis of diabetes was made 7 years ago. Her disease course has been improving. There are no hypoglycemic associated symptoms. Pertinent negatives for diabetes include no polydipsia, no polyphagia, no weakness and no weight loss. There are no hypoglycemic complications. Diabetic complications include peripheral neuropathy. Risk factors for coronary artery disease include diabetes mellitus, hypertension, obesity, sedentary lifestyle and dyslipidemia. Current diabetic treatment includes diet, oral agent (dual therapy) and insulin pump. She is compliant with treatment all of the time. Her weight is decreasing steadily. She is following a diabetic and generally healthy diet. She has not had a previous visit with a dietitian. There is no change in her  home blood glucose trend.    Patient Active Problem List   Diagnosis Date Noted   Demand ischemia of myocardium 06/18/2022   Chronic heart failure with preserved ejection fraction (HFpEF) ([place]) 06/18/2022   SVT (supraventricular tachycardia) 06/18/2022   OSA (obstructive sleep apnea) 06/18/2022   Vitamin B12 deficiency 05/07/2021   Goiter 09/12/2020   Hereditary and idiopathic neuropathy, unspecified 09/12/2020   Nontoxic single thyroid nodule 09/12/2020   Presence of insulin pump (external) (internal) 09/12/2020   Morbid obesity with BMI of 50.0-59.9, adult ([place]) 01/12/2020   Urinary incontinence 01/12/2020   Labia enlarged 05/29/2018   Abdominal pannus 05/29/2018   Small vessel disease, cerebrovascular 09/30/2017   Diabetic polyneuropathy associated with type 2 diabetes mellitus ([place]) 07/01/2017   Vitamin D deficiency 07/01/2017   Depression 01/11/2015   PTSD (post-traumatic stress disorder) 01/11/2015   T2DM (type 2 diabetes mellitus) ([place]) 01/11/2015   Essential hypertension 01/11/2015   High cholesterol 01/11/2015   Edema 01/11/2015   Past Medical History:  Diagnosis Date   Allergy    Anginal pain (HCC)    Anxiety    Arthritis    Asthma    Bladder disorder, other    Borderline personality disorder ([place])    Carotid artery disease ([place]) 03/05/2017   Depression    Diabetes mellitus without complication (HCC)    GERD (gastroesophageal reflux disease)    Headache    Hyperlipidemia    Hypertension    Kidney stones    Mucous retention cyst of maxillary sinus 08/31/2021   Noted on head CT   Neuromuscular disorder (HCC)    Neuropathy   PTSD (post-traumatic stress disorder)    Shortness of breath    Sleep apnea    Stroke ([place]) 2018   Thyroid disease  nodule   Past Surgical History:  Procedure Laterality Date   CARPAL TUNNEL RELEASE     left 2015   CERVICAL FUSION Bilateral 2003   C 5-6 with titanium screws   CHOLECYSTECTOMY     COLONOSCOPY WITH PROPOFOL N/A  06/24/2014   Procedure: COLONOSCOPY WITH PROPOFOL;  Surgeon: Rogene Houston, MD;  Location: [facility];  Service: Endoscopy;  Laterality: N/A;  cecum time in 0810    time out   0821    total time 11 minutes   DILITATION & CURRETTAGE/HYSTROSCOPY WITH HYDROTHERMAL ABLATION N/A 04/19/2021   Procedure: DILATATION & CURETTAGE/HYSTEROSCOPY WITH HYDROTHERMAL ABLATION;  Surgeon: Janyth Pupa, DO;  Location: [facility];  Service: Gynecology;  Laterality: N/A;   INTRAUTERINE DEVICE (IUD) INSERTION N/A 04/19/2021   Procedure: INTRAUTERINE DEVICE (IUD) INSERTION;  Surgeon: Janyth Pupa, DO;  Location: [facility];  Service: Gynecology;  Laterality: N/A;   POLYPECTOMY N/A 06/24/2014   Procedure: RECTAL POLYPECTOMY;  Surgeon: Rogene Houston, MD;  Location: [facility];  Service: Endoscopy;  Laterality: N/A;   RIGHT/LEFT HEART CATH AND CORONARY ANGIOGRAPHY N/A 06/19/2022   Procedure: RIGHT/LEFT HEART CATH AND CORONARY ANGIOGRAPHY;  Surgeon: Sherren Mocha, MD;  Location: [facility];  Service: Cardiovascular;  Laterality: N/A;   [place] EXTRACTION Bilateral 2001 and 1990s   top and bottom    Social History   Tobacco Use   Smoking status: Never   Smokeless tobacco: Never  Vaping Use   Vaping Use: Never used  Substance Use Topics   Alcohol use: No    Comment: Rarely   Drug use: No   Social History   Socioeconomic History   Marital status: Married    Spouse name: [name]   Number of children: 0   Years of education: HS   Highest education level: Not on file  Occupational History   Occupation: Disabled  Tobacco Use   Smoking status: Never   Smokeless tobacco: Never  Vaping Use   Vaping Use: Never used  Substance and Sexual Activity   Alcohol use: No    Comment: Rarely   Drug use: No   Sexual activity: Not Currently    Birth control/protection: I.U.D.  Other Topics Concern   Not on file  Social History Narrative   Lives at home with husband.    Right-handed.   3-4 cups caffeine per day.   Social Determinants of Health   Financial Resource Strain: Medium Risk (04/03/2021)   Overall Financial Resource Strain (CARDIA)    Difficulty of Paying Living Expenses: Somewhat hard  Food Insecurity: Food Insecurity Present (04/03/2021)   Hunger Vital Sign    Worried About Running Out of Food in the Last Year: Sometimes true    Ran Out of Food in the Last Year: Never true  Transportation Needs: No Transportation Needs (04/03/2021)   PRAPARE - Hydrologist (Medical): No    Lack of Transportation (Non-Medical): No  Physical Activity: Insufficiently Active (04/03/2021)   Exercise Vital Sign    Days of Exercise per Week: 1 day    Minutes of Exercise per Session: 10 min  Stress: No Stress Concern Present (04/03/2021)   [place]    Feeling of Stress : Only a little  Social Connections: Moderately Isolated (04/03/2021)   Social Connection and Isolation Panel [NHANES]    Frequency of Communication with Friends and  Family: Once a week    Frequency of Social Gatherings with Friends and Family: Once a week    Attends Religious Services: 1 to 4 times per year    Active Member of [employer] or Organizations: No    Attends Archivist Meetings: Never    Marital Status: Married  Human resources officer Violence: Not At Risk (04/03/2021)   Humiliation, Afraid, Rape, and Kick questionnaire    Fear of Current or Ex-Partner: No    Emotionally Abused: No    Physically Abused: No    Sexually Abused: No   Family Status  Relation Name Status   Mother  Deceased   Father  Deceased   Sister  Alive   Other married Alive   Sister  Alive   MGM  Deceased   MGF  Deceased   PGM  Deceased   PGF  Deceased   Family History  Problem Relation Age of Onset   COPD Mother    Arthritis Mother    Asthma Mother    Depression Mother    Diabetes Mother    Heart disease  Mother    Hyperlipidemia Mother    Hypertension Mother    Mental illness Mother    Skin cancer Mother    Lung cancer Father    Arthritis Father    Depression Father    Drug abuse Father    Early death Father    Kidney disease Father    Mental illness Father    Colon cancer Sister    Birth defects Sister    COPD Sister    Hyperlipidemia Sister    Learning disabilities Sister    Breast cancer Sister    Breast cancer Sister    Colon cancer Sister    Alcohol abuse Paternal Grandmother    Diabetes Paternal Grandmother    Alcohol abuse Paternal Grandfather    Allergies  Allergen Reactions   Actos [Pioglitazone] Swelling   Augmentin [Amoxicillin-Pot Clavulanate] Diarrhea, Nausea Only and Other (See Comments)    Caused severe diarrhea   Citrus Other (See Comments)    "Issues with my tongue"- the fleshy part of the fruit    Invokana [Canagliflozin] Other (See Comments)    Yeast infection   Lopid [Gemfibrozil] Other (See Comments)    Made the tongue burn   Metoprolol Other (See Comments)    Headaches    Naproxen Sodium Other (See Comments)    Patient states she "felt loopy"   Penicillins Diarrhea and Other (See Comments)    Violent diarrhea- must have Diflucan   Pineapple Other (See Comments)    "Issues with my tongue"   Statins Other (See Comments)    Joint pain   Tape Itching and Other (See Comments)    Tape, EKG leads, and Band-Aids - Itching and redness   Amlodipine Cough   Clavulanic Acid Nausea Only   Hctz [Hydrochlorothiazide] Rash   Lisinopril Cough   Morphine And Related Itching and Rash      Review of Systems  Constitutional: Negative.  Negative for weight loss.  HENT: Negative.    Respiratory:  Positive for shortness of breath.   Cardiovascular: Negative.   Genitourinary: Negative.   Skin: Negative.  Negative for itching and rash.  Neurological:  Negative for weakness.  Endo/Heme/Allergies:  Negative for polydipsia and polyphagia.  All other systems  reviewed and are negative.     Objective:     BP 137/66   Pulse 64   Temp 98.4 F (36.9  C)   Ht '5\' 6"'$  (1.676 m)   Wt (!) 325 lb (147.4 kg)   SpO2 100%   BMI 52.46 kg/m  BP Readings from Last 3 Encounters:  07/04/22 137/66  06/29/22 122/65  06/20/22 (!) 168/61   Wt Readings from Last 3 Encounters:  07/04/22 (!) 325 lb (147.4 kg)  06/29/22 (!) 317 lb 9.6 oz (144.1 kg)  06/20/22 (!) 312 lb (141.5 kg)      Physical Exam Vitals and nursing note reviewed.  Constitutional:      Appearance: Normal appearance. She is obese.  HENT:     Right Ear: External ear normal.     Left Ear: External ear normal.     Nose: Nose normal.     Mouth/Throat:     Mouth: Mucous membranes are moist.     Pharynx: Oropharynx is clear.  Eyes:     Conjunctiva/sclera: Conjunctivae normal.  Cardiovascular:     Pulses: Normal pulses.     Heart sounds: Normal heart sounds.  Pulmonary:     Effort: Pulmonary effort is normal. No respiratory distress.     Breath sounds: Normal breath sounds. No wheezing.     Comments: Patient os on O2 2L Abdominal:     General: Bowel sounds are normal.  Neurological:     Mental Status: She is alert.      No results found for any visits on 07/04/22.  Last CBC Lab Results  Component Value Date   WBC 7.6 06/20/2022   HGB 11.7 (L) 06/20/2022   HCT 37.2 06/20/2022   MCV 87.3 06/20/2022   MCH 27.5 06/20/2022   RDW 15.2 06/20/2022   PLT 382 08/22/7251   Last metabolic panel Lab Results  Component Value Date   GLUCOSE 166 (H) 06/20/2022   NA 135 06/20/2022   K 4.1 06/20/2022   CL 102 06/20/2022   CO2 25 06/20/2022   BUN 16 06/20/2022   CREATININE 0.86 06/20/2022   GFRNONAA >60 06/20/2022   CALCIUM 8.7 (L) 06/20/2022   PROT 7.7 06/19/2022   ALBUMIN 3.0 (L) 06/19/2022   LABGLOB 2.3 03/21/2022   AGRATIO 1.8 03/21/2022   BILITOT 0.7 06/19/2022   ALKPHOS 86 06/19/2022   AST 23 06/19/2022   ALT 24 06/19/2022   ANIONGAP 8 06/20/2022   Last  lipids Lab Results  Component Value Date   CHOL 188 03/21/2022   HDL 39 (L) 03/21/2022   LDLCALC 116 (H) 03/21/2022   TRIG 185 (H) 03/21/2022   CHOLHDL 4.8 (H) 03/21/2022   Last hemoglobin A1c Lab Results  Component Value Date   HGBA1C 6.9 (H) 06/18/2022   Last thyroid functions Lab Results  Component Value Date   TSH 2.320 12/18/2016   Last vitamin D Lab Results  Component Value Date   VD25OH 15.6 (L) 03/21/2022   Last vitamin B12 and Folate Lab Results  Component Value Date   VITAMINB12 180 (L) 03/21/2022   FOLATE 6.0 03/07/2021      The ASCVD Risk score (Arnett DK, et al., 2019) failed to calculate for the following reasons:   The valid total cholesterol range is 130 to 320 mg/dL    Assessment & Plan:   Problem List Items Addressed This Visit       Cardiovascular and Mediastinum   Essential hypertension - Primary (Chronic)    Hypertension well controlled on current regimen no changes needed. Continue current medication dose,follow up with cardiology, no labs needed today, patient is set up for labs in 2  months.         Endocrine   Diabetic polyneuropathy associated with type 2 diabetes mellitus ([place])    Diabetes  well controlled on current regimen, no changes necessary, continue diabetic diet, weight loss and exercise 3x a week as tolerated.         Other   High cholesterol    Cholesterol levels are improving. Labs will be repeated in 2 months.       Other Visit Diagnoses     Mild intermittent asthma without complication       Relevant Medications   albuterol (VENTOLIN HFA) 108 (90 Base) MCG/ACT inhaler       Return in about 3 months (around 10/04/2022) for chronic disease management.    Ivy Lynn, NP

## 2022-07-04 NOTE — Assessment & Plan Note (Signed)
Cholesterol levels are improving. Labs will be repeated in 2 months.

## 2022-07-04 NOTE — Patient Instructions (Addendum)
Hypertension, Adult ?Hypertension is another name for high blood pressure. High blood pressure forces your heart to work harder to pump blood. This can cause problems over time. ?There are two numbers in a blood pressure reading. There is a top number (systolic) over a bottom number (diastolic). It is best to have a blood pressure that is below 120/80. ?What are the causes? ?The cause of this condition is not known. Some other conditions can lead to high blood pressure. ?What increases the risk? ?Some lifestyle factors can make you more likely to develop high blood pressure: ?Smoking. ?Not getting enough exercise or physical activity. ?Being overweight. ?Having too much fat, sugar, calories, or salt (sodium) in your diet. ?Drinking too much alcohol. ?Other risk factors include: ?Having any of these conditions: ?Heart disease. ?Diabetes. ?High cholesterol. ?Kidney disease. ?Obstructive sleep apnea. ?Having a family history of high blood pressure and high cholesterol. ?Age. The risk increases with age. ?Stress. ?What are the signs or symptoms? ?High blood pressure may not cause symptoms. Very high blood pressure (hypertensive crisis) may cause: ?Headache. ?Fast or uneven heartbeats (palpitations). ?Shortness of breath. ?Nosebleed. ?Vomiting or feeling like you may vomit (nauseous). ?Changes in how you see. ?Very bad chest pain. ?Feeling dizzy. ?Seizures. ?How is this treated? ?This condition is treated by making healthy lifestyle changes, such as: ?Eating healthy foods. ?Exercising more. ?Drinking less alcohol. ?Your doctor may prescribe medicine if lifestyle changes do not help enough and if: ?Your top number is above 130. ?Your bottom number is above 80. ?Your personal target blood pressure may vary. ?Follow these instructions at home: ?Eating and drinking ? ?If told, follow the DASH eating plan. To follow this plan: ?Fill one half of your plate at each meal with fruits and vegetables. ?Fill one fourth of your plate  at each meal with whole grains. Whole grains include whole-wheat pasta, brown rice, and whole-grain bread. ?Eat or drink low-fat dairy products, such as skim milk or low-fat yogurt. ?Fill one fourth of your plate at each meal with low-fat (lean) proteins. Low-fat proteins include fish, chicken without skin, eggs, beans, and tofu. ?Avoid fatty meat, cured and processed meat, or chicken with skin. ?Avoid pre-made or processed food. ?Limit the amount of salt in your diet to less than 1,500 mg each day. ?Do not drink alcohol if: ?Your doctor tells you not to drink. ?You are pregnant, may be pregnant, or are planning to become pregnant. ?If you drink alcohol: ?Limit how much you have to: ?0-1 drink a day for women. ?0-2 drinks a day for men. ?Know how much alcohol is in your drink. In the [place] one drink equals one 12 oz bottle of beer (355 mL), one 5 oz glass of wine (148 mL), or one 1? oz glass of hard liquor (44 mL). ?Lifestyle ? ?Work with your doctor to stay at a healthy weight or to lose weight. Ask your doctor what the best weight is for you. ?Get at least 30 minutes of exercise that causes your heart to beat faster (aerobic exercise) most days of the week. This may include walking, swimming, or biking. ?Get at least 30 minutes of exercise that strengthens your muscles (resistance exercise) at least 3 days a week. This may include lifting weights or doing Pilates. ?Do not smoke or use any products that contain nicotine or tobacco. If you need help quitting, ask your doctor. ?Check your blood pressure at home as told by your doctor. ?Keep all follow-up visits. ?Medicines ?Take over-the-counter and prescription medicines   only as told by your doctor. Follow directions carefully. ?Do not skip doses of blood pressure medicine. The medicine does not work as well if you skip doses. Skipping doses also puts you at risk for problems. ?Ask your doctor about side effects or reactions to medicines that you should watch  for. ?Contact a doctor if: ?You think you are having a reaction to the medicine you are taking. ?You have headaches that keep coming back. ?You feel dizzy. ?You have swelling in your ankles. ?You have trouble with your vision. ?Get help right away if: ?You get a very bad headache. ?You start to feel mixed up (confused). ?You feel weak or numb. ?You feel faint. ?You have very bad pain in your: ?Chest. ?Belly (abdomen). ?You vomit more than once. ?You have trouble breathing. ?These symptoms may be an emergency. Get help right away. Call 911. ?Do not wait to see if the symptoms will go away. ?Do not drive yourself to the hospital. ?Summary ?Hypertension is another name for high blood pressure. ?High blood pressure forces your heart to work harder to pump blood. ?For most people, a normal blood pressure is less than 120/80. ?Making healthy choices can help lower blood pressure. If your blood pressure does not get lower with healthy choices, you may need to take medicine. ?This information is not intended to replace advice given to you by your health care provider. Make sure you discuss any questions you have with your health care provider. ?Document Revised: 05/25/2021 Document Reviewed: 05/25/2021 ?Elsevier Patient Education ? 2023 Elsevier Inc. ? ?

## 2022-07-09 ENCOUNTER — Other Ambulatory Visit: Payer: Self-pay | Admitting: *Deleted

## 2022-07-09 MED ORDER — VALACYCLOVIR HCL 500 MG PO TABS: 500.0000 mg | ORAL_TABLET | Freq: Every day | ORAL | 0 refills | Status: DC

## 2022-07-11 NOTE — Telephone Encounter (Signed)
Pt seen 11/15

## 2022-07-16 ENCOUNTER — Telehealth: Payer: Self-pay | Admitting: Nurse Practitioner

## 2022-07-16 NOTE — Telephone Encounter (Signed)
Pt called to let PCP know that Dr Chalmers Cater (endo) sent her a letter stating that they were not going to continue care with her.   Needs new referral for Endo. Wants to remind PCP that pt is currently on insulin pump.

## 2022-07-17 ENCOUNTER — Encounter (HOSPITAL_BASED_OUTPATIENT_CLINIC_OR_DEPARTMENT_OTHER): Payer: BC Managed Care – PPO | Admitting: Cardiology

## 2022-07-17 ENCOUNTER — Other Ambulatory Visit: Payer: Self-pay | Admitting: Nurse Practitioner

## 2022-07-17 DIAGNOSIS — R0683 Snoring: Secondary | ICD-10-CM | POA: Diagnosis not present

## 2022-07-17 DIAGNOSIS — E1142 Type 2 diabetes mellitus with diabetic polyneuropathy: Secondary | ICD-10-CM

## 2022-07-17 NOTE — Telephone Encounter (Signed)
New referral completed.

## 2022-07-18 NOTE — Procedures (Signed)
       SLEEP STUDY REPORT Patient Information Study Date: 07/17/2022 Patient Name: Kathryn Jimenez Patient ID: [id number] Birth Date: 03/10/1971 Age: 51 Gender: Female BMI: 51.0 (W=317 lb, H=5' 6'')  Referring Physician: Sande Rives, PA  TEST DESCRIPTION: Home sleep apnea testing was completed using the WatchPat, a Type 1 device, utilizing peripheral arterial tonometry (PAT), chest movement, actigraphy, pulse oximetry, pulse rate, body position and snore. AHI was calculated with apnea and hypopnea using valid sleep time as the denominator. RDI includes apneas, hypopneas, and RERAs. The data acquired and the scoring of sleep and all associated events were performed in accordance with the recommended standards and specifications as outlined in the AASM Manual for the Scoring of Sleep and Associated Events 2.2.0 (2015).   FINDINGS: 1.  No evidence of Obstructive Sleep Apnea with AHI 2.9/hr.  2.  No Central Sleep Apnea. 3.  Oxygen desaturations as low as 82%. 4.  Moderate snoring was present. O2 sats were < 88% for 82 minutes. 5.  Total sleep time was 8 hrs and 9 min. 6.  9.5% of total sleep time was spent in REM sleep.  7.  Normal sleep onset latency at 22 min.  8.  Prolonged REM sleep onset latency at 484 min.  9.  Total awakenings were 13.  10.  Arrhythmia analysis demonstrates possible short run of atrial fibrillation lasting 19 seconds.   This is only a screening tool and is not diagnostic.  DIAGNOSIS:  Normal study with no significant sleep disordered breathing. Possible Paroxysmal atrial fibrillation  RECOMMENDATIONS:   1. Normal study with no significant sleep disordered breathing.  2.  Healthy sleep recommendations include:  adequate nightly sleep (normal 7-9 hrs/night), avoidance of caffeine after noon and alcohol near bedtime, and maintaining a sleep environment that is cool, dark and quiet.  3.  Weight loss for overweight patients is recommended.    4.  Snoring  recommendations include:  weight loss where appropriate, side sleeping, and avoidance of alcohol before bed.  5.  Operation of motor vehicle or dangerous equipment must be avoided when feeling drowsy, excessively sleepy, or mentally fatigued.    6.  An ENT consultation which may be useful for specific causes of and possible treatment of bothersome snoring.   7. Weight loss may be of benefit in reducing the severity of snoring.   8.  Consider outpatient heart monitor to assess for silent atrial arrhythmias.  Signature:   Fransico Him, MD; [facility]; [place], [place] Board of Sleep Medicine Electronically Signed: 07/18/2022

## 2022-07-20 ENCOUNTER — Encounter: Payer: Self-pay | Admitting: Obstetrics & Gynecology

## 2022-07-20 ENCOUNTER — Ambulatory Visit: Payer: BC Managed Care – PPO | Attending: Student

## 2022-07-20 ENCOUNTER — Ambulatory Visit (INDEPENDENT_AMBULATORY_CARE_PROVIDER_SITE_OTHER): Payer: BC Managed Care – PPO | Admitting: Obstetrics & Gynecology

## 2022-07-20 ENCOUNTER — Other Ambulatory Visit (HOSPITAL_COMMUNITY)
Admission: RE | Admit: 2022-07-20 | Discharge: 2022-07-20 | Disposition: A | Discharge status: Home / Self Care | Payer: BC Managed Care – PPO | Source: Ambulatory Visit | Attending: Obstetrics & Gynecology | Admitting: Obstetrics & Gynecology

## 2022-07-20 VITALS — BP 116/61 | HR 62 | Ht 66.0 in | Wt 319.1 lb

## 2022-07-20 DIAGNOSIS — Z01419 Encounter for gynecological examination (general) (routine) without abnormal findings: Secondary | ICD-10-CM | POA: Diagnosis not present

## 2022-07-20 DIAGNOSIS — Z9889 Other specified postprocedural states: Secondary | ICD-10-CM

## 2022-07-20 DIAGNOSIS — N760 Acute vaginitis: Secondary | ICD-10-CM

## 2022-07-20 DIAGNOSIS — Z1231 Encounter for screening mammogram for malignant neoplasm of breast: Secondary | ICD-10-CM

## 2022-07-20 DIAGNOSIS — Z975 Presence of (intrauterine) contraceptive device: Secondary | ICD-10-CM

## 2022-07-20 MED ORDER — FLUCONAZOLE 150 MG PO TABS: ORAL_TABLET | ORAL | 4 refills | Status: DC

## 2022-07-20 MED ORDER — CLOTRIMAZOLE-BETAMETHASONE 1-0.05 % EX CREA: 1.0000 | TOPICAL_CREAM | Freq: Two times a day (BID) | CUTANEOUS | 4 refills | Status: AC

## 2022-07-20 NOTE — Progress Notes (Signed)
WELL-WOMAN EXAMINATION Patient name: Kathryn Jimenez MRN [id number]  Date of birth: 1970/11/22 Chief Complaint:   Annual Exam (Vaginal discharge and itching )  History of Present Illness:   Kathryn Jimenez is a 51 y.o. G0P0000  female with known h/o T2DM, SVT, demand ischemia, OSA, HTN and morbid obesity being seen today for a routine well-woman exam and the following concerns:   -Vaginal irritation: She notes considerable itching and irritation.  Typically this occurs s/p antibiotics, which she was just one while at the hospital.  Pt given Diflucan by PCP, which did help somewhat, but she continues to note considerable irritation and itching.  States she is just "clawing" at her skin.  Of note, for the past 2 days, she has noted some light bright red spotting- she cannot tell if the blood is urinary or vaginal.  S/p endometrial ablation with IUD insertion 03/2021- last visit 05/2021- denies vaginal bleeding.  Other than the past few days, it seems as though she has not had any further bleeding.  **Pt s/p hospitalization (11/1) due to CHF.  No LMP recorded. Patient has had an ablation.  The current method of family planning is IUD.    Last pap to be collected today.  Last mammogram: pt overdue. Last colonoscopy: 2015     07/20/2022   11:28 AM 07/04/2022   11:14 AM 06/01/2022   11:07 AM 03/21/2022    2:54 PM 01/03/2022   11:31 AM  Depression screen PHQ 2/9  Decreased Interest 1 0 0 1 0  Down, Depressed, Hopeless 1 0 0 1 0  PHQ - 2 Score 2 0 0 2 0  Altered sleeping 1 0  1 0  Tired, decreased energy 1 0  1 0  Change in appetite 1 0  1 0  Feeling bad or failure about yourself  1 0  0 0  Trouble concentrating 1 0  2 0  Moving slowly or fidgety/restless 1 0  1 0  Suicidal thoughts 0 0  0 0  PHQ-9 Score 8 0  8 0  Difficult doing work/chores    Somewhat difficult       Review of Systems:   Pertinent items are noted in HPI Denies any headaches, blurred vision, fatigue, shortness of  breath, chest pain, abdominal pain, bowel movements, urination, or intercourse unless otherwise stated above.  Pertinent History Reviewed:  Reviewed past medical,surgical, social and family history.  Reviewed problem list, medications and allergies. Physical Assessment:   Vitals:   07/20/22 1130  BP: 116/61  Pulse: 62  Weight: (!) 319 lb 2 oz (144.8 kg)  Height: '5\' 6"'$  (1.676 m)  Body mass index is 51.51 kg/m.        Physical Examination:   General appearance - well appearing, and in no distress  Mental status - alert, oriented to person, place, and time  Psych:  She has a normal mood and affect  Skin - warm and dry, normal color, no suspicious lesions noted  Chest - effort normal, all lung fields clear to auscultation bilaterally  Heart - normal rate and regular rhythm  Neck:  midline trachea, no thyromegaly or nodules  Breasts - breasts appear normal, no suspicious masses, no skin or nipple changes or  axillary nodes  Abdomen - obese, soft, nontender, nondistended, no masses or organomegaly  Pelvic - VULVA: diffuse erythema with bilateral excoriation noted VAGINA: normal appearing vagina with normal color and discharge, no lesions  CERVIX: normal appearing cervix without discharge or lesions,  no CMT, strings visualized at os  Thin prep pap is done with HR HPV cotesting  UTERUS: uterus is felt to be normal size, shape, consistency and nontender   ADNEXA: No adnexal masses or tenderness noted.  Extremities:  No swelling or varicosities noted  Chaperone: Counselling psychologist     Assessment & Plan:  1) Well-Woman Exam -pap collected, reviewed screening guidelines -mammogram ordered -colonoscopy up to date  2) Recurrent vaginitis -rx for Diflucan as well as Lotrison -pt to f/u prn  Orders Placed This Encounter  Procedures   MM 3D SCREEN BREAST BILATERAL    Meds:  Meds ordered this encounter  Medications   fluconazole (DIFLUCAN) 150 MG tablet    Sig: Take one tablet once  then repeat in 3 days if needed    Dispense:  2 tablet    Refill:  4   clotrimazole-betamethasone (LOTRISONE) cream    Sig: Apply 1 Application topically 2 (two) times daily for 7 days.    Dispense:  14 g    Refill:  4    Follow-up: Return in about 1 year (around 07/21/2023) for Annual.   Janyth Pupa, DO Attending [place] [place] [place]

## 2022-07-23 LAB — CYTOLOGY - PAP
Comment: NEGATIVE
Diagnosis: NEGATIVE
High risk HPV: NEGATIVE

## 2022-07-25 ENCOUNTER — Telehealth: Payer: Self-pay | Admitting: *Deleted

## 2022-07-25 DIAGNOSIS — I509 Heart failure, unspecified: Secondary | ICD-10-CM | POA: Diagnosis not present

## 2022-07-25 DIAGNOSIS — J189 Pneumonia, unspecified organism: Secondary | ICD-10-CM | POA: Diagnosis not present

## 2022-07-25 DIAGNOSIS — R0902 Hypoxemia: Secondary | ICD-10-CM | POA: Diagnosis not present

## 2022-07-25 NOTE — Telephone Encounter (Signed)
-----   Message from Sueanne Margarita, MD sent at 07/18/2022  4:02 PM EST ----- Normal home sleep study so in lab PSG will be ordered

## 2022-07-25 NOTE — Telephone Encounter (Signed)
Left message to return a call to discuss sleep study result and recommendations.

## 2022-07-26 ENCOUNTER — Telehealth: Payer: Self-pay | Admitting: Nurse Practitioner

## 2022-07-26 ENCOUNTER — Encounter: Payer: Self-pay | Admitting: Student

## 2022-07-26 NOTE — Telephone Encounter (Signed)
Left patient detailed voice message as requested, advised to let us know if she still wishes to be referred there.

## 2022-07-26 NOTE — Progress Notes (Unsigned)
   I ordered a sleep study at last visit on 06/29/2022 for further evaluation of obstructive sleep apnea. Sleep study was normal with no significant sleep disordered breathing. However, it did show possible short runs of paroxysmal atrial fibrillation (although this is no diagnostic).  I recommend a 30 day Event Monitor to rule out atrial fibrillation. Sharyn Lull, can you please call patient and help arrange this?  Thank you! Arora Coakley

## 2022-07-26 NOTE — Telephone Encounter (Signed)
Endocrinologist recently dropped her. Patient is on insulin pump and reports blood sugars dropping in the middle of night and in morning. States she was recently put on Jardiance for heart and has recently lost 30 lbs which may be reasons for change. Has referral into Endocrinology in [place]. Would like referral to [facility] instead and would like to know what adjustments to make in the meantime.

## 2022-07-27 ENCOUNTER — Telehealth: Payer: Self-pay

## 2022-07-27 DIAGNOSIS — E119 Type 2 diabetes mellitus without complications: Secondary | ICD-10-CM | POA: Diagnosis not present

## 2022-07-27 DIAGNOSIS — I4891 Unspecified atrial fibrillation: Secondary | ICD-10-CM

## 2022-07-27 NOTE — Progress Notes (Signed)
See telephone message

## 2022-07-27 NOTE — Telephone Encounter (Signed)
    I ordered a sleep study at last visit on 06/29/2022 for further evaluation of obstructive sleep apnea. Sleep study was normal with no significant sleep disordered breathing. However, it did show possible short runs of paroxysmal atrial fibrillation (although this is no diagnostic).   I recommend a 30 day Event Monitor to rule out atrial fibrillation. Sharyn Lull, can you please call patient and help arrange this?   Thank you! Callie  Pt informed of providers result & recommendations. Pt verbalized understanding. All questions, if any, were answered. Monitor ordered. Informed pt to make sure she keeps the box that the monitor will need to mail the monitor back for reading.

## 2022-07-30 ENCOUNTER — Other Ambulatory Visit: Payer: Self-pay | Admitting: Cardiology

## 2022-07-30 DIAGNOSIS — G4733 Obstructive sleep apnea (adult) (pediatric): Secondary | ICD-10-CM

## 2022-07-30 DIAGNOSIS — I10 Essential (primary) hypertension: Secondary | ICD-10-CM

## 2022-08-01 ENCOUNTER — Other Ambulatory Visit: Payer: Self-pay | Admitting: Nurse Practitioner

## 2022-08-01 ENCOUNTER — Telehealth: Payer: Self-pay | Admitting: *Deleted

## 2022-08-01 DIAGNOSIS — I2510 Atherosclerotic heart disease of native coronary artery without angina pectoris: Secondary | ICD-10-CM

## 2022-08-01 DIAGNOSIS — I10 Essential (primary) hypertension: Secondary | ICD-10-CM

## 2022-08-01 DIAGNOSIS — G4733 Obstructive sleep apnea (adult) (pediatric): Secondary | ICD-10-CM

## 2022-08-01 DIAGNOSIS — E1142 Type 2 diabetes mellitus with diabetic polyneuropathy: Secondary | ICD-10-CM

## 2022-08-01 NOTE — Telephone Encounter (Signed)
I completed referral to endocrinology. We don't manage insulin pump, have patient send Blood glucose values for two days  so I can see her numbers.

## 2022-08-01 NOTE — Telephone Encounter (Signed)
-----   Message from Sueanne Margarita, MD sent at 07/18/2022  4:02 PM EST ----- Normal home sleep study so in lab PSG will be ordered

## 2022-08-01 NOTE — Telephone Encounter (Signed)
The patient has been notified of the result. Left detailed message on voicemail and informed patient to call back..Kathryn Jimenez, CMA   

## 2022-08-02 NOTE — Telephone Encounter (Addendum)
Prior Authorization for NPSG sent to [employer] via Phone. Reference # .

## 2022-08-02 NOTE — Telephone Encounter (Signed)
Pt aware.

## 2022-08-02 NOTE — Addendum Note (Signed)
Addended by: Freada Bergeron on: 08/02/2022 01:13 PM   Modules accepted: Orders

## 2022-08-08 ENCOUNTER — Ambulatory Visit: Payer: BC Managed Care – PPO | Attending: Student

## 2022-08-08 DIAGNOSIS — I4891 Unspecified atrial fibrillation: Secondary | ICD-10-CM | POA: Diagnosis not present

## 2022-08-08 DIAGNOSIS — H40013 Open angle with borderline findings, low risk, bilateral: Secondary | ICD-10-CM | POA: Diagnosis not present

## 2022-08-09 ENCOUNTER — Other Ambulatory Visit: Payer: Self-pay | Admitting: Nurse Practitioner

## 2022-08-09 DIAGNOSIS — N39498 Other specified urinary incontinence: Secondary | ICD-10-CM

## 2022-08-10 ENCOUNTER — Telehealth: Payer: Self-pay | Admitting: Nurse Practitioner

## 2022-08-10 ENCOUNTER — Other Ambulatory Visit: Payer: Self-pay | Admitting: *Deleted

## 2022-08-10 ENCOUNTER — Telehealth: Payer: Self-pay | Admitting: *Deleted

## 2022-08-10 NOTE — Telephone Encounter (Signed)
Patient calling to give blood sugar readings   12/14 8:25am 183, 1:35pm 140, 9:23pm 168 12/15 7:08am 96, 9:54am 80, 3:53pm 135 12/16 4:14am 86, 9:04am 91   Average 128 (7 day), 153 (14 day) 171 (30 day), 148 (90 day)

## 2022-08-10 NOTE — Telephone Encounter (Signed)
Dr. Nelda Marseille approved Valacyclovir HCL 500 mg #30 take 1 tablet once daily ok with 11 refills. [facility] & pt aware. [place]

## 2022-08-15 NOTE — Telephone Encounter (Signed)
I will call patient over the phone tomorrow, is she needing anything in particular? If her blood sugar is not controlled, please have patient schedule a phone visit or office visit with me

## 2022-08-16 NOTE — Telephone Encounter (Signed)
Thank you :)

## 2022-08-16 NOTE — Telephone Encounter (Signed)
Pt does not have any concerns or questions at this time , pt was just calling in to give her recent readings

## 2022-08-25 DIAGNOSIS — I509 Heart failure, unspecified: Secondary | ICD-10-CM | POA: Diagnosis not present

## 2022-08-25 DIAGNOSIS — J189 Pneumonia, unspecified organism: Secondary | ICD-10-CM | POA: Diagnosis not present

## 2022-08-25 DIAGNOSIS — R0902 Hypoxemia: Secondary | ICD-10-CM | POA: Diagnosis not present

## 2022-09-04 DIAGNOSIS — F329 Major depressive disorder, single episode, unspecified: Secondary | ICD-10-CM | POA: Diagnosis not present

## 2022-09-06 NOTE — Telephone Encounter (Addendum)
Prior Authorization for NPSG sent to [employer] via Phone. Reference # . APPROVED-08-02-22--11-01-22- AUTH# [id number]

## 2022-09-11 NOTE — Telephone Encounter (Signed)
See other message: Gerarda Gunther  to Aaria Hatler   07/27/22 11:44 AM Mrs Ashworth I have registered this online to be mailed to your home address. Once you receive it, if you have any questions please call our office or the monitor company. A booklet is included inside the box with detailed instructions. Valetta Fuller [facility] [phone]

## 2022-09-13 ENCOUNTER — Other Ambulatory Visit (HOSPITAL_COMMUNITY)
Admission: RE | Admit: 2022-09-13 | Discharge: 2022-09-13 | Disposition: A | Discharge status: Home / Self Care | Payer: BC Managed Care – PPO | Source: Ambulatory Visit | Attending: Student | Admitting: Student

## 2022-09-13 DIAGNOSIS — I10 Essential (primary) hypertension: Secondary | ICD-10-CM | POA: Insufficient documentation

## 2022-09-13 DIAGNOSIS — E118 Type 2 diabetes mellitus with unspecified complications: Secondary | ICD-10-CM | POA: Insufficient documentation

## 2022-09-13 DIAGNOSIS — Z794 Long term (current) use of insulin: Secondary | ICD-10-CM | POA: Insufficient documentation

## 2022-09-13 DIAGNOSIS — I471 Supraventricular tachycardia, unspecified: Secondary | ICD-10-CM | POA: Insufficient documentation

## 2022-09-13 DIAGNOSIS — E785 Hyperlipidemia, unspecified: Secondary | ICD-10-CM | POA: Insufficient documentation

## 2022-09-13 DIAGNOSIS — I2510 Atherosclerotic heart disease of native coronary artery without angina pectoris: Secondary | ICD-10-CM | POA: Diagnosis not present

## 2022-09-13 DIAGNOSIS — I5032 Chronic diastolic (congestive) heart failure: Secondary | ICD-10-CM | POA: Insufficient documentation

## 2022-09-13 DIAGNOSIS — G4733 Obstructive sleep apnea (adult) (pediatric): Secondary | ICD-10-CM | POA: Insufficient documentation

## 2022-09-13 DIAGNOSIS — Z8673 Personal history of transient ischemic attack (TIA), and cerebral infarction without residual deficits: Secondary | ICD-10-CM | POA: Insufficient documentation

## 2022-09-13 LAB — LIPID PANEL
Cholesterol: 176 mg/dL (ref 0–200)
HDL: 36 mg/dL — ABNORMAL LOW (ref >40)
LDL Cholesterol: 115 mg/dL — ABNORMAL HIGH (ref 0–99)
Total CHOL/HDL Ratio: 4.9 RATIO
Triglycerides: 123 mg/dL (ref <150)
VLDL: 25 mg/dL (ref 0–40)

## 2022-09-13 LAB — HEPATIC FUNCTION PANEL
ALT: 17 U/L (ref 0–44)
AST: 17 U/L (ref 15–41)
Albumin: 3.7 g/dL (ref 3.5–5.0)
Alkaline Phosphatase: 88 U/L (ref 38–126)
Bilirubin, Direct: 0.1 mg/dL (ref 0.0–0.2)
Indirect Bilirubin: 0.6 mg/dL (ref 0.3–0.9)
Total Bilirubin: 0.7 mg/dL (ref 0.3–1.2)
Total Protein: 7.7 g/dL (ref 6.5–8.1)

## 2022-09-18 ENCOUNTER — Encounter: Payer: Self-pay | Admitting: Nurse Practitioner

## 2022-09-18 ENCOUNTER — Ambulatory Visit (INDEPENDENT_AMBULATORY_CARE_PROVIDER_SITE_OTHER): Payer: BC Managed Care – PPO | Admitting: Nurse Practitioner

## 2022-09-18 ENCOUNTER — Telehealth: Payer: Self-pay | Admitting: Nurse Practitioner

## 2022-09-18 VITALS — BP 144/68 | HR 59 | Ht 66.0 in | Wt 311.2 lb

## 2022-09-18 DIAGNOSIS — Z794 Long term (current) use of insulin: Secondary | ICD-10-CM

## 2022-09-18 DIAGNOSIS — E119 Type 2 diabetes mellitus without complications: Secondary | ICD-10-CM | POA: Diagnosis not present

## 2022-09-18 MED ORDER — CAREFINE PEN NEEDLES 31G X 6 MM MISC: 11 refills | Status: DC

## 2022-09-18 MED ORDER — NOVOLOG FLEXPEN 100 UNIT/ML ~~LOC~~ SOPN: 10.0000 [IU] | PEN_INJECTOR | Freq: Three times a day (TID) | SUBCUTANEOUS | 3 refills | Status: DC

## 2022-09-18 MED ORDER — TOUJEO MAX SOLOSTAR 300 UNIT/ML ~~LOC~~ SOPN: 35.0000 [IU] | PEN_INJECTOR | Freq: Every day | SUBCUTANEOUS | 3 refills | Status: DC

## 2022-09-18 NOTE — Progress Notes (Signed)
Endocrinology Consult Note       09/18/2022, 3:46 PM   Subjective:    Patient ID: Kathryn Jimenez, female    DOB: [birth year].  Claude Gillaspie is being seen in consultation for management of currently uncontrolled symptomatic diabetes requested by  Baruch Gouty, FNP.   Past Medical History:  Diagnosis Date   Allergy    Anginal pain ([place])    Anxiety    Arthritis    Asthma    Bladder disorder, other    Borderline personality disorder ([place])    Carotid artery disease ([place]) 03/05/2017   Depression    Diabetes mellitus without complication (HCC)    GERD (gastroesophageal reflux disease)    Headache    Hyperlipidemia    Hypertension    Kidney stones    Mucous retention cyst of maxillary sinus 08/31/2021   Noted on head CT   Neuromuscular disorder (HCC)    Neuropathy   PTSD (post-traumatic stress disorder)    Shortness of breath    Sleep apnea    Stroke ([place]) 2018   Thyroid disease    nodule    Past Surgical History:  Procedure Laterality Date   CARPAL TUNNEL RELEASE     left 2015   CERVICAL FUSION Bilateral 2003   C 5-6 with titanium screws   CHOLECYSTECTOMY     COLONOSCOPY WITH PROPOFOL N/A 06/24/2014   Procedure: COLONOSCOPY WITH PROPOFOL;  Surgeon: Rogene Houston, MD;  Location: [facility];  Service: Endoscopy;  Laterality: N/A;  cecum time in 0810    time out   0821    total time 11 minutes   DILITATION & CURRETTAGE/HYSTROSCOPY WITH HYDROTHERMAL ABLATION N/A 04/19/2021   Procedure: DILATATION & CURETTAGE/HYSTEROSCOPY WITH HYDROTHERMAL ABLATION;  Surgeon: Janyth Pupa, DO;  Location: [facility];  Service: Gynecology;  Laterality: N/A;   INTRAUTERINE DEVICE (IUD) INSERTION N/A 04/19/2021   Procedure: INTRAUTERINE DEVICE (IUD) INSERTION;  Surgeon: Janyth Pupa, DO;  Location: [facility];  Service: Gynecology;  Laterality: N/A;   POLYPECTOMY N/A 06/24/2014   Procedure: RECTAL POLYPECTOMY;  Surgeon: Rogene Houston, MD;  Location: [facility];  Service: Endoscopy;  Laterality: N/A;   RIGHT/LEFT HEART CATH AND CORONARY ANGIOGRAPHY N/A 06/19/2022   Procedure: RIGHT/LEFT HEART CATH AND CORONARY ANGIOGRAPHY;  Surgeon: Sherren Mocha, MD;  Location: [facility];  Service: Cardiovascular;  Laterality: N/A;   [place] EXTRACTION Bilateral 2001 and 1990s   top and bottom     Social History   Socioeconomic History   Marital status: Married    Spouse name: [name]   Number of children: 0   Years of education: HS   Highest education level: Not on file  Occupational History   Occupation: Disabled  Tobacco Use   Smoking status: Never   Smokeless tobacco: Never  Vaping Use   Vaping Use: Never used  Substance and Sexual Activity   Alcohol use: No    Comment: Rarely   Drug use: No   Sexual activity: Not Currently    Birth control/protection: I.U.D.  Other Topics Concern   Not on file  Social History Narrative  Lives at home with husband.   Right-handed.   3-4 cups caffeine per day.   Social Determinants of Health   Financial Resource Strain: Medium Risk (07/20/2022)   Overall Financial Resource Strain (CARDIA)    Difficulty of Paying Living Expenses: Somewhat hard  Food Insecurity: Food Insecurity Present (07/20/2022)   Hunger Vital Sign    Worried About Running Out of Food in the Last Year: Sometimes true    Ran Out of Food in the Last Year: Sometimes true  Transportation Needs: Unmet Transportation Needs (07/20/2022)   PRAPARE - Hydrologist (Medical): Yes    Lack of Transportation (Non-Medical): Yes  Physical Activity: Inactive (07/20/2022)   Exercise Vital Sign    Days of Exercise per Week: 0 days    Minutes of Exercise per Session: 0 min  Stress: No Stress Concern Present (04/03/2021)   [place]    Feeling of Stress : Only a little   Social Connections: Moderately Isolated (07/20/2022)   Social Connection and Isolation Panel [NHANES]    Frequency of Communication with Friends and Family: Three times a week    Frequency of Social Gatherings with Friends and Family: Once a week    Attends Religious Services: Never    Marine scientist or Organizations: No    Attends Music therapist: Never    Marital Status: Married    Family History  Problem Relation Age of Onset   COPD Mother    Arthritis Mother    Asthma Mother    Depression Mother    Diabetes Mother    Heart disease Mother    Hyperlipidemia Mother    Hypertension Mother    Mental illness Mother    Skin cancer Mother    Lung cancer Father    Arthritis Father    Depression Father    Drug abuse Father    Early death Father    Kidney disease Father    Mental illness Father    Colon cancer Sister    Birth defects Sister    COPD Sister    Hyperlipidemia Sister    Learning disabilities Sister    Breast cancer Sister    Breast cancer Sister    Colon cancer Sister    Alcohol abuse Paternal Grandmother    Diabetes Paternal Grandmother    Alcohol abuse Paternal Grandfather     Outpatient Encounter Medications as of 09/18/2022  Medication Sig   acetaminophen (TYLENOL) 500 MG tablet Take 500 mg by mouth every 6 (six) hours as needed for mild pain or moderate pain.   albuterol (VENTOLIN HFA) 108 (90 Base) MCG/ACT inhaler 2 PUFFS INTO THE LUNGS EVERY 6 HOURS AS NEEDED FOR WHEEZING OR SHORTNESS OF BREATH. Strength: 108 (90 Base) MCG/ACT   benzonatate (TESSALON PERLES) 100 MG capsule Take 1 capsule (100 mg total) by mouth 3 (three) times daily as needed. (Patient taking differently: Take 100 mg by mouth 3 (three) times daily as needed for cough.)   buPROPion (WELLBUTRIN XL) 150 MG 24 hr tablet Take 150 mg by mouth daily.   clobetasol cream (TEMOVATE) 7.67 % Apply 1 application topically 2 (two) times daily as needed (scalp psoriasis).    clopidogrel (PLAVIX) 75 MG tablet Take 1 tablet (75 mg total) by mouth daily.   cycloSPORINE (RESTASIS) 0.05 % ophthalmic emulsion Place 1 drop into both eyes 2 (two) times daily.   diphenhydrAMINE (BENADRYL) 25 mg capsule Take  25 mg by mouth every 6 (six) hours as needed for allergies.   ezetimibe (ZETIA) 10 MG tablet Take 1 tablet (10 mg total) by mouth daily.   fluconazole (DIFLUCAN) 150 MG tablet Take one tablet once then repeat in 3 days if needed   FLUoxetine (PROZAC) 40 MG capsule Take 40 mg by mouth daily.   furosemide (LASIX) 40 MG tablet Take 1 tablet (40 mg total) by mouth in the morning.   guaiFENesin (MUCINEX) 600 MG 12 hr tablet Take 1 tablet (600 mg total) by mouth 2 (two) times daily. (Patient taking differently: Take 600 mg by mouth 2 (two) times daily as needed for to loosen phlegm or cough.)   Incontinence Supply Disposable (PREVAIL IB FULL MAT BRIEF 2XL) MISC 1 each by Does not apply route every 2 (two) hours as needed.   insulin aspart (NOVOLOG FLEXPEN) 100 UNIT/ML FlexPen Inject 10-16 Units into the skin 3 (three) times daily with meals.   insulin glargine, 2 Unit Dial, (TOUJEO MAX SOLOSTAR) 300 UNIT/ML Solostar Pen Inject 35 Units into the skin at bedtime.   Insulin Pen Needle (CAREFINE PEN NEEDLES) 31G X 6 MM MISC Use to inject insulin 4 times daily   levonorgestrel (LILETTA, 52 MG,) 20.1 MCG/DAY IUD 1 each by Intrauterine route once. Inserted 04/19/21   LORazepam (ATIVAN) 0.5 MG tablet Take 0.25-0.5 mg by mouth daily as needed for anxiety.   losartan (COZAAR) 50 MG tablet TAKE 1 TABLET ONCE DAILY.   metoprolol succinate (TOPROL-XL) 50 MG 24 hr tablet Take 1 tablet (50 mg total) by mouth daily. Take with or immediately following a meal.   nystatin (MYCOSTATIN/NYSTOP) powder Apply 1 application  topically daily as needed (for fungal infections).   OVER THE COUNTER MEDICATION Take 1-2 drops by mouth daily as needed (Thyroid Function). Potassium Iodide 4 %; Iodine 2 %; Distilled  water   oxybutynin (DITROPAN-XL) 10 MG 24 hr tablet TAKE 1 TABLET BY MOUTH AT BEDTIME.   OXYGEN Inhale 4 L/min into the lungs continuous.   potassium chloride (KLOR-CON) 10 MEQ tablet Take 2 tablets (20 mEq total) by mouth daily.   prazosin (MINIPRESS) 1 MG capsule Take 1 capsule (1 mg total) by mouth at bedtime.   terconazole (TERAZOL 3) 0.8 % vaginal cream Place 1 applicator vaginally at bedtime.   valACYclovir (VALTREX) 500 MG tablet Take 1 tablet (500 mg total) by mouth daily.   Vitamin D, Ergocalciferol, (DRISDOL) 1.25 MG (50000 UNIT) CAPS capsule Take 1 capsule (50,000 Units total) by mouth 2 (two) times a week. (Patient taking differently: Take 50,000 Units by mouth every 7 (seven) days.)   [DISCONTINUED] empagliflozin (JARDIANCE) 10 MG TABS tablet TAKE 1 TABLET ONCE DAILY.   [DISCONTINUED] insulin aspart (NOVOLOG) 100 UNIT/ML injection Inject into the skin See admin instructions. Per pump/"2.9 units per hour"   [DISCONTINUED] Insulin Disposable Pump (OMNIPOD CLASSIC PODS, GEN 3,) MISC Inject 1 Device into the skin every other day.   No facility-administered encounter medications on file as of 09/18/2022.    ALLERGIES: Allergies  Allergen Reactions   Actos [Pioglitazone] Swelling   Augmentin [Amoxicillin-Pot Clavulanate] Diarrhea, Nausea Only and Other (See Comments)    Caused severe diarrhea   Citrus Other (See Comments)    "Issues with my tongue"- the fleshy part of the fruit    Invokana [Canagliflozin] Other (See Comments)    Yeast infection   Lopid [Gemfibrozil] Other (See Comments)    Made the tongue burn   Metoprolol Other (See Comments)  Headaches    Naproxen Sodium Other (See Comments)    Patient states she "felt loopy"   Penicillins Diarrhea and Other (See Comments)    Violent diarrhea- must have Diflucan   Pineapple Other (See Comments)    "Issues with my tongue"   Statins Other (See Comments)    Joint pain   Tape Itching and Other (See Comments)    Tape, EKG  leads, and Band-Aids - Itching and redness   Amlodipine Cough   Clavulanic Acid Nausea Only   Hctz [Hydrochlorothiazide] Rash   Lisinopril Cough   Morphine And Related Itching and Rash    VACCINATION STATUS: Immunization History  Administered Date(s) Administered   Influenza,inj,Quad PF,6+ Mos 07/01/2017, 05/29/2018   Influenza-Unspecified 05/25/2014, 07/03/2016   Tdap 01/26/2010, 01/21/2020    Diabetes She presents for her initial diabetic visit. She has type 2 diabetes mellitus. Onset time: Diagnosed at approx age of 38. Her disease course has been fluctuating. Associated symptoms include fatigue, foot paresthesias, polydipsia, polyuria and weight loss. Symptoms are stable. Diabetic complications include a CVA, heart disease (CHF) and peripheral neuropathy. Risk factors for coronary artery disease include obesity, hypertension, sedentary lifestyle, family history, dyslipidemia and diabetes mellitus. Current diabetic treatment includes insulin injections and oral agent (monotherapy) (recently started on Jardiance). Her weight is decreasing steadily. She is following a generally healthy diet. Meal planning includes avoidance of concentrated sweets. She has not had a previous visit with a dietitian. She participates in exercise intermittently. (She presents today for her consultation with her meter and logs showing fluctuating glycemic profile with readings around 180-200 range.  Her POCT A1c today is 9.1% increasing from her last A1c of 6.9%.  She used to be on an insulin pump with old Omnipod 3 but she stopped using it a while ago.  She was recently hospitalized and diagnosed with CHF and put on Jardiance.  She monitors glucose twice daily right now.  She eats 2-3 meals per day and occasionally snacks.  She drinks water (sometimes flavored with SF flavoring or citrus fruits), diet soda, and unsweet tea.  Her physical activity is limited due to endurance from recent CHF exacerbation.  She is UTD on  eye exam, has never seen podiatry in the past (has had trouble finding one that takes Medicaid).) An ACE inhibitor/angiotensin II receptor blocker is being taken. She does not see a podiatrist.Eye exam is current.     Review of systems  Constitutional: + steadily decreasing body weight, current Body mass index is 50.23 kg/m., no fatigue, no subjective hyperthermia, no subjective hypothermia Eyes: no blurry vision, no xerophthalmia ENT: no sore throat, no nodules palpated in throat, no dysphagia/odynophagia, no hoarseness Cardiovascular: no chest pain, no shortness of breath, no palpitations, no leg swelling Respiratory: no cough, no shortness of breath Gastrointestinal: no nausea/vomiting/diarrhea Genitourinary: + frequent UTIs and yeast infections Musculoskeletal: no muscle/joint aches Skin: no rashes, no hyperemia, reports yeast in her groin area Neurological: no tremors, + numbness/tingling to bilateral hands and feet due to neuropathy, no dizziness Psychiatric: no depression, no anxiety  Objective:     BP (!) 144/68 (BP Location: Left Arm, Patient Position: Sitting, Cuff Size: Large)   Pulse (!) 59   Ht '5\' 6"'$  (1.676 m)   Wt (!) 311 lb 3.2 oz (141.2 kg)   BMI 50.23 kg/m   Wt Readings from Last 3 Encounters:  09/18/22 (!) 311 lb 3.2 oz (141.2 kg)  07/20/22 (!) 319 lb 2 oz (144.8 kg)  07/04/22 (!) 325  lb (147.4 kg)     BP Readings from Last 3 Encounters:  09/18/22 (!) 144/68  07/20/22 116/61  07/04/22 137/66     Physical Exam- Limited  Constitutional:  Body mass index is 50.23 kg/m. , not in acute distress, normal state of mind Eyes:  EOMI, no exophthalmos Neck: Supple Musculoskeletal: no gross deformities, strength intact in all four extremities, no gross restriction of joint movements Skin:  no rashes, no hyperemia Neurological: no tremor with outstretched hands    CMP ( most recent) CMP     Component Value Date/Time   NA 135 06/20/2022 0108   NA 137  03/21/2022 1540   K 4.1 06/20/2022 0108   CL 102 06/20/2022 0108   CO2 25 06/20/2022 0108   GLUCOSE 166 (H) 06/20/2022 0108   BUN 16 06/20/2022 0108   BUN 10 03/21/2022 1540   CREATININE 0.86 06/20/2022 0108   CALCIUM 8.7 (L) 06/20/2022 0108   PROT 7.7 09/13/2022 1044   PROT 6.4 03/21/2022 1540   ALBUMIN 3.7 09/13/2022 1044   ALBUMIN 4.1 03/21/2022 1540   AST 17 09/13/2022 1044   ALT 17 09/13/2022 1044   ALKPHOS 88 09/13/2022 1044   BILITOT 0.7 09/13/2022 1044   BILITOT 0.3 03/21/2022 1540   GFRNONAA >60 06/20/2022 0108   GFRAA 125 07/01/2017 1019     Diabetic Labs (most recent): Lab Results  Component Value Date   HGBA1C 6.9 (H) 06/18/2022   HGBA1C 8.6 (H) 04/18/2021   HGBA1C 6.9 12/30/2020     Lipid Panel ( most recent) Lipid Panel     Component Value Date/Time   CHOL 176 09/13/2022 1045   CHOL 188 03/21/2022 1540   TRIG 123 09/13/2022 1045   HDL 36 (L) 09/13/2022 1045   HDL 39 (L) 03/21/2022 1540   CHOLHDL 4.9 09/13/2022 1045   VLDL 25 09/13/2022 1045   LDLCALC 115 (H) 09/13/2022 1045   LDLCALC 116 (H) 03/21/2022 1540   LABVLDL 33 03/21/2022 1540      Lab Results  Component Value Date   TSH 2.320 12/18/2016           Assessment & Plan:   1) Type 2 diabetes without complication with long-term current use of insulin  She presents today for her consultation with her meter and logs showing fluctuating glycemic profile with readings around 180-200 range.  Her POCT A1c today is 9.1% increasing from her last A1c of 6.9%.  She used to be on an insulin pump with old Omnipod 3 but she stopped using it a while ago.  She was recently hospitalized and diagnosed with CHF and put on Jardiance.  She monitors glucose twice daily right now.  She eats 2-3 meals per day and occasionally snacks.  She drinks water (sometimes flavored with SF flavoring or citrus fruits), diet soda, and unsweet tea.  Her physical activity is limited due to endurance from recent CHF  exacerbation.  She is UTD on eye exam, has never seen podiatry in the past (has had trouble finding one that takes Medicaid).  - Janasia Corsetti has currently uncontrolled symptomatic type 2 DM since 52 years of age, with most recent A1c of 9.1 %.   -Recent labs reviewed.  - I had a long discussion with her about the progressive nature of diabetes and the pathology behind its complications. -her diabetes is complicated by CHF, neuropathy, CVA and she remains at a high risk for more acute and chronic complications which include CAD, CVA, CKD, retinopathy, and  neuropathy. These are all discussed in detail with her.  The following Lifestyle Medicine recommendations according to [facility]) were discussed and offered to patient and she agrees to start the journey:  A. Whole Foods, Plant-based plate comprising of fruits and vegetables, plant-based proteins, whole-grain carbohydrates was discussed in detail with the patient.   A list for source of those nutrients were also provided to the patient.  Patient will use only water or unsweetened tea for hydration. B.  The need to stay away from risky substances including alcohol, smoking; obtaining 7 to 9 hours of restorative sleep, at least 150 minutes of moderate intensity exercise weekly, the importance of healthy social connections,  and stress reduction techniques were discussed. C.  A full color page of  Calorie density of various food groups per pound showing examples of each food groups was provided to the patient.  - I have counseled her on diet and weight management by adopting a carbohydrate restricted/protein rich diet. Patient is encouraged to switch to unprocessed or minimally processed complex starch and increased protein intake (animal or plant source), fruits, and vegetables. -  she is advised to stick to a routine mealtimes to eat 3 meals a day and avoid unnecessary snacks (to snack only to correct hypoglycemia).    - she acknowledges that there is a room for improvement in her food and drink choices. - Suggestion is made for her to avoid simple carbohydrates from her diet including Cakes, Sweet Desserts, Ice Cream, Soda (diet and regular), Sweet Tea, Candies, Chips, Cookies, Store Bought Juices, Alcohol in Excess of 1-2 drinks a day, Artificial Sweeteners, Coffee Creamer, and "Sugar-free" Products. This will help patient to have more stable blood glucose profile and potentially avoid unintended weight gain.  - I have approached her with the following individualized plan to manage her diabetes and patient agrees:   -She will do best by starting on basal/bolus combo.  I started her on Toujeo 35 units SQ nightly and she can continue her Novolog but adjust her dose to 10-16 units TID with meals if glucose is above 90 and she is eating (Specific instructions on how to titrate insulin dosage based on glucose readings given to patient in writing).  She demonstrated her ability to properly use the SSI to dose her meal time insulin with me today.  -she is encouraged to start monitoring glucose 4 times daily, before meals and before bed, to log their readings on the clinic sheets provided, and bring them to review at follow up appointment in 4 weeks.  She could benefit from CGM device but is concerned that her body will not tolerate the adhesive.  I did give her sample Dexcom G7 to try today along with sample Skin Tac wipe to act as barrier between.  - she is warned not to take insulin without proper monitoring per orders. - Adjustment parameters are given to her for hypo and hyperglycemia in writing. - she is encouraged to call clinic for blood glucose levels less than 70 or above 300 mg /dl.  - her JARDIANCE will be discontinued, risk outweighs benefit for this patient.  Given her account of frequent UTIs and vaginal yeast infections, and skin irritation in her groin, this class of medications is not the best for  her.  - she will be considered for incretin therapy as appropriate next visit.  I did encourage her to research GLP1 products.  - Specific targets for  A1c; LDL, HDL, and  Triglycerides were discussed with the patient.  2) Blood Pressure /Hypertension:  her blood pressure is controlled to target.   she is advised to continue her current medications including Losartan 50 mg p.o. daily with breakfast, Lasix 40 mg po daily, and Metoprolol 25 mg po daily .  3) Lipids/Hyperlipidemia:    Review of her recent lipid panel from 09/13/22 showed uncontrolled LDL at 115.  She has statin intolerance but is advised to continue Zetia 10 mg daily.  4)  Weight/Diet:  her Body mass index is 50.23 kg/m.  -  clearly complicating her diabetes care.   she is a candidate for weight loss. I discussed with her the fact that loss of 5 - 10% of her  current body weight will have the most impact on her diabetes management.  Exercise, and detailed carbohydrates information provided  -  detailed on discharge instructions.  5) Chronic Care/Health Maintenance: -she is on ACEI/ARB and has intolerance to Statin medications and is encouraged to initiate and continue to follow up with Ophthalmology, Dentist, Podiatrist at least yearly or according to recommendations, and advised to stay away from smoking. I have recommended yearly flu vaccine and pneumonia vaccine at least every 5 years; moderate intensity exercise for up to 150 minutes weekly; and sleep for at least 7 hours a day.  - she is advised to maintain close follow up with Rakes, Connye Burkitt, FNP for primary care needs, as well as her other providers for optimal and coordinated care.   - Time spent in this patient care: 75 min, of which > 50% was spent in counseling her about her diabetes and the rest reviewing her blood glucose logs, discussing her hypoglycemia and hyperglycemia episodes, reviewing her current and previous labs/studies (including abstraction from other  facilities) and medications doses and developing a long term treatment plan based on the latest standards of care/guidelines; and documenting her care.    Please refer to Patient Instructions for Blood Glucose Monitoring and Insulin/Medications Dosing Guide" in media tab for additional information. Please also refer to "Patient Self Inventory" in the Media tab for reviewed elements of pertinent patient history.  Quincey Gorelick participated in the discussions, expressed understanding, and voiced agreement with the above plans.  All questions were answered to her satisfaction. she is encouraged to contact clinic should she have any questions or concerns prior to her return visit.     Follow up plan: - Return in about 1 month (around 10/18/2022) for Diabetes F/U, Bring meter and logs.    Rayetta Pigg, [facility] [address] [place], [place] [address] Phone: [phone] Fax: [phone]  09/18/2022, 3:46 PM

## 2022-09-18 NOTE — Patient Instructions (Signed)

## 2022-09-18 NOTE — Telephone Encounter (Signed)
Faxed medical record request to Dr Almetta Lovely office

## 2022-09-19 ENCOUNTER — Telehealth: Payer: Self-pay | Admitting: *Deleted

## 2022-09-19 NOTE — Telephone Encounter (Signed)
I havent seen any communication from the pharmacy about request for alternative.  I had given her a sample to get started, continue using that until we get it straightened out.  She can be switched to any of the insurance preferred insulins instead like Lantus, Basaglar, [place], or Tresiba at same dose.

## 2022-09-19 NOTE — Telephone Encounter (Signed)
Patient left a voicemail that the pharmacy cannot get her insulin that was prescribed for her on yesterday at her office visit. Also, her insurance want pay for it. Any other recommendations?

## 2022-09-19 NOTE — Telephone Encounter (Signed)
Talked with the patient. She states that she would lto go ahead and get another insulin called in. Lantus Solo star Pen  - inject 35 units Sq nightly , dispense 12 mL with 3 refills was called to [facility].  Patient was called and made aware.

## 2022-09-25 ENCOUNTER — Other Ambulatory Visit: Payer: Self-pay | Admitting: *Deleted

## 2022-09-25 DIAGNOSIS — Z794 Long term (current) use of insulin: Secondary | ICD-10-CM

## 2022-09-25 DIAGNOSIS — J189 Pneumonia, unspecified organism: Secondary | ICD-10-CM | POA: Diagnosis not present

## 2022-09-25 DIAGNOSIS — I509 Heart failure, unspecified: Secondary | ICD-10-CM | POA: Diagnosis not present

## 2022-09-25 DIAGNOSIS — R0902 Hypoxemia: Secondary | ICD-10-CM | POA: Diagnosis not present

## 2022-09-25 MED ORDER — DEXCOM G7 SENSOR MISC: 3 refills | Status: DC

## 2022-09-27 ENCOUNTER — Other Ambulatory Visit: Payer: Self-pay | Admitting: Family Medicine

## 2022-09-27 DIAGNOSIS — F4312 Post-traumatic stress disorder, chronic: Secondary | ICD-10-CM

## 2022-10-03 ENCOUNTER — Ambulatory Visit: Payer: BC Managed Care – PPO | Admitting: Neurology

## 2022-10-05 ENCOUNTER — Ambulatory Visit: Payer: BC Managed Care – PPO | Admitting: Family Medicine

## 2022-10-05 ENCOUNTER — Ambulatory Visit: Payer: BC Managed Care – PPO | Admitting: Nurse Practitioner

## 2022-10-09 ENCOUNTER — Other Ambulatory Visit: Payer: Self-pay | Admitting: *Deleted

## 2022-10-09 ENCOUNTER — Ambulatory Visit: Payer: BC Managed Care – PPO | Admitting: Family Medicine

## 2022-10-09 MED ORDER — DEXCOM G7 SENSOR MISC: 2 refills | Status: DC

## 2022-10-09 NOTE — Telephone Encounter (Signed)
Patient called and said that the Dexom G7 sensor was sent to [facility], she has been told by them that they do not have them. She called [facility] in [place] and they told the patient that they had it. She requested that we send the refill request to them . This has been done. Patient was called and made aware.

## 2022-10-19 ENCOUNTER — Ambulatory Visit (INDEPENDENT_AMBULATORY_CARE_PROVIDER_SITE_OTHER): Payer: BC Managed Care – PPO | Admitting: Nurse Practitioner

## 2022-10-19 ENCOUNTER — Encounter: Payer: Self-pay | Admitting: Nurse Practitioner

## 2022-10-19 VITALS — BP 150/76 | HR 53 | Ht 66.0 in | Wt 314.4 lb

## 2022-10-19 DIAGNOSIS — Z794 Long term (current) use of insulin: Secondary | ICD-10-CM | POA: Diagnosis not present

## 2022-10-19 DIAGNOSIS — E119 Type 2 diabetes mellitus without complications: Secondary | ICD-10-CM | POA: Diagnosis not present

## 2022-10-19 LAB — POCT GLYCOSYLATED HEMOGLOBIN (HGB A1C): Hemoglobin A1C: 8.7 % — AB (ref 4.0–5.6)

## 2022-10-19 MED ORDER — LANTUS SOLOSTAR 100 UNIT/ML ~~LOC~~ SOPN: 35.0000 [IU] | PEN_INJECTOR | Freq: Every day | SUBCUTANEOUS | 3 refills | Status: DC

## 2022-10-19 MED ORDER — DEXCOM G7 SENSOR MISC: 3 refills | Status: DC

## 2022-10-19 MED ORDER — NOVOLOG FLEXPEN 100 UNIT/ML ~~LOC~~ SOPN: 14.0000 [IU] | PEN_INJECTOR | Freq: Three times a day (TID) | SUBCUTANEOUS | 3 refills | Status: DC

## 2022-10-19 NOTE — Patient Instructions (Signed)

## 2022-10-19 NOTE — Progress Notes (Signed)
Endocrinology Follow Up Note       10/19/2022, 11:09 AM   Subjective:    Patient ID: Kathryn Jimenez, female    DOB: Jun 26, 1971.  Kathryn Jimenez is being seen in follow up after being seen in consultation for management of currently uncontrolled symptomatic diabetes requested by  Pryor Ochoa, FNP.   Past Medical History:  Diagnosis Date   Allergy    Anginal pain ([place])    Anxiety    Arthritis    Asthma    Bladder disorder, other    Borderline personality disorder ([place])    Carotid artery disease ([place]) 03/05/2017   Depression    Diabetes mellitus without complication (HCC)    GERD (gastroesophageal reflux disease)    Headache    Hyperlipidemia    Hypertension    Kidney stones    Mucous retention cyst of maxillary sinus 08/31/2021   Noted on head CT   Neuromuscular disorder (HCC)    Neuropathy   PTSD (post-traumatic stress disorder)    Shortness of breath    Sleep apnea    Stroke ([place]) 2018   Thyroid disease    nodule    Past Surgical History:  Procedure Laterality Date   CARPAL TUNNEL RELEASE     left 2015   CERVICAL FUSION Bilateral 2003   C 5-6 with titanium screws   CHOLECYSTECTOMY     COLONOSCOPY WITH PROPOFOL N/A 06/24/2014   Procedure: COLONOSCOPY WITH PROPOFOL;  Surgeon: Rogene Houston, MD;  Location: [facility];  Service: Endoscopy;  Laterality: N/A;  cecum time in 0810    time out   0821    total time 11 minutes   DILITATION & CURRETTAGE/HYSTROSCOPY WITH HYDROTHERMAL ABLATION N/A 04/19/2021   Procedure: DILATATION & CURETTAGE/HYSTEROSCOPY WITH HYDROTHERMAL ABLATION;  Surgeon: Janyth Pupa, DO;  Location: [facility];  Service: Gynecology;  Laterality: N/A;   INTRAUTERINE DEVICE (IUD) INSERTION N/A 04/19/2021   Procedure: INTRAUTERINE DEVICE (IUD) INSERTION;  Surgeon: Janyth Pupa, DO;  Location: [facility];  Service: Gynecology;  Laterality: N/A;   POLYPECTOMY N/A 06/24/2014   Procedure:  RECTAL POLYPECTOMY;  Surgeon: Rogene Houston, MD;  Location: [facility];  Service: Endoscopy;  Laterality: N/A;   RIGHT/LEFT HEART CATH AND CORONARY ANGIOGRAPHY N/A 06/19/2022   Procedure: RIGHT/LEFT HEART CATH AND CORONARY ANGIOGRAPHY;  Surgeon: Sherren Mocha, MD;  Location: [facility];  Service: Cardiovascular;  Laterality: N/A;   [place] EXTRACTION Bilateral 2001 and 1990s   top and bottom     Social History   Socioeconomic History   Marital status: Married    Spouse name: [name]   Number of children: 0   Years of education: HS   Highest education level: Not on file  Occupational History   Occupation: Disabled  Tobacco Use   Smoking status: Never   Smokeless tobacco: Never  Vaping Use   Vaping Use: Never used  Substance and Sexual Activity   Alcohol use: No    Comment: Rarely   Drug use: No   Sexual activity: Not Currently    Birth control/protection: I.U.D.  Other Topics Concern  Not on file  Social History Narrative   Lives at home with husband.   Right-handed.   3-4 cups caffeine per day.   Social Determinants of Health   Financial Resource Strain: Medium Risk (07/20/2022)   Overall Financial Resource Strain (CARDIA)    Difficulty of Paying Living Expenses: Somewhat hard  Food Insecurity: Food Insecurity Present (07/20/2022)   Hunger Vital Sign    Worried About Running Out of Food in the Last Year: Sometimes true    Ran Out of Food in the Last Year: Sometimes true  Transportation Needs: Unmet Transportation Needs (07/20/2022)   PRAPARE - Hydrologist (Medical): Yes    Lack of Transportation (Non-Medical): Yes  Physical Activity: Inactive (07/20/2022)   Exercise Vital Sign    Days of Exercise per Week: 0 days    Minutes of Exercise per Session: 0 min  Stress: No Stress Concern Present (04/03/2021)   [place]     Feeling of Stress : Only a little  Social Connections: Moderately Isolated (07/20/2022)   Social Connection and Isolation Panel [NHANES]    Frequency of Communication with Friends and Family: Three times a week    Frequency of Social Gatherings with Friends and Family: Once a week    Attends Religious Services: Never    Marine scientist or Organizations: No    Attends Music therapist: Never    Marital Status: Married    Family History  Problem Relation Age of Onset   COPD Mother    Arthritis Mother    Asthma Mother    Depression Mother    Diabetes Mother    Heart disease Mother    Hyperlipidemia Mother    Hypertension Mother    Mental illness Mother    Skin cancer Mother    Lung cancer Father    Arthritis Father    Depression Father    Drug abuse Father    Early death Father    Kidney disease Father    Mental illness Father    Colon cancer Sister    Birth defects Sister    COPD Sister    Hyperlipidemia Sister    Learning disabilities Sister    Breast cancer Sister    Breast cancer Sister    Colon cancer Sister    Alcohol abuse Paternal Grandmother    Diabetes Paternal Grandmother    Alcohol abuse Paternal Grandfather     Outpatient Encounter Medications as of 10/19/2022  Medication Sig   acetaminophen (TYLENOL) 500 MG tablet Take 500 mg by mouth every 6 (six) hours as needed for mild pain or moderate pain.   albuterol (VENTOLIN HFA) 108 (90 Base) MCG/ACT inhaler 2 PUFFS INTO THE LUNGS EVERY 6 HOURS AS NEEDED FOR WHEEZING OR SHORTNESS OF BREATH. Strength: 108 (90 Base) MCG/ACT   benzonatate (TESSALON PERLES) 100 MG capsule Take 1 capsule (100 mg total) by mouth 3 (three) times daily as needed. (Patient taking differently: Take 100 mg by mouth 3 (three) times daily as needed for cough.)   buPROPion (WELLBUTRIN XL) 150 MG 24 hr tablet Take 150 mg by mouth daily.   clobetasol cream (TEMOVATE) [id number] % Apply 1 application topically 2 (two) times daily as  needed (scalp psoriasis).   clopidogrel (PLAVIX) 75 MG tablet Take 1 tablet (75 mg total) by mouth daily.   cycloSPORINE (RESTASIS) 0.05 % ophthalmic emulsion Place 1 drop into both eyes 2 (two) times  daily.   diphenhydrAMINE (BENADRYL) 25 mg capsule Take 25 mg by mouth every 6 (six) hours as needed for allergies.   ezetimibe (ZETIA) 10 MG tablet Take 1 tablet (10 mg total) by mouth daily.   fluconazole (DIFLUCAN) 150 MG tablet Take one tablet once then repeat in 3 days if needed   FLUoxetine (PROZAC) 40 MG capsule Take 40 mg by mouth daily.   furosemide (LASIX) 40 MG tablet Take 1 tablet (40 mg total) by mouth in the morning.   guaiFENesin (MUCINEX) 600 MG 12 hr tablet Take 1 tablet (600 mg total) by mouth 2 (two) times daily. (Patient taking differently: Take 600 mg by mouth 2 (two) times daily as needed for to loosen phlegm or cough.)   Incontinence Supply Disposable (PREVAIL IB FULL MAT BRIEF 2XL) MISC 1 each by Does not apply route every 2 (two) hours as needed.   insulin glargine (LANTUS SOLOSTAR) 100 UNIT/ML Solostar Pen Inject 35 Units into the skin at bedtime.   Insulin Pen Needle (CAREFINE PEN NEEDLES) 31G X 6 MM MISC Use to inject insulin 4 times daily   levonorgestrel (LILETTA, 52 MG,) 20.1 MCG/DAY IUD 1 each by Intrauterine route once. Inserted 04/19/21   LORazepam (ATIVAN) 0.5 MG tablet Take 0.25-0.5 mg by mouth daily as needed for anxiety.   losartan (COZAAR) 50 MG tablet TAKE 1 TABLET ONCE DAILY.   metoprolol succinate (TOPROL-XL) 50 MG 24 hr tablet Take 1 tablet (50 mg total) by mouth daily. Take with or immediately following a meal.   nystatin (MYCOSTATIN/NYSTOP) powder Apply 1 application  topically daily as needed (for fungal infections).   OVER THE COUNTER MEDICATION Take 1-2 drops by mouth daily as needed (Thyroid Function). Potassium Iodide 4 %; Iodine 2 %; Distilled water   oxybutynin (DITROPAN-XL) 10 MG 24 hr tablet TAKE 1 TABLET BY MOUTH AT BEDTIME.   OXYGEN Inhale 4 L/min  into the lungs continuous.   potassium chloride (KLOR-CON) 10 MEQ tablet Take 2 tablets (20 mEq total) by mouth daily.   prazosin (MINIPRESS) 1 MG capsule TAKE 1 CAPSULE BY MOUTH AT BEDTIME.   terconazole (TERAZOL 3) 0.8 % vaginal cream Place 1 applicator vaginally at bedtime.   valACYclovir (VALTREX) 500 MG tablet Take 1 tablet (500 mg total) by mouth daily.   Vitamin D, Ergocalciferol, (DRISDOL) 1.25 MG (50000 UNIT) CAPS capsule Take 1 capsule (50,000 Units total) by mouth 2 (two) times a week. (Patient taking differently: Take 50,000 Units by mouth every 7 (seven) days.)   [DISCONTINUED] Continuous Blood Gluc Sensor (DEXCOM G7 SENSOR) MISC Monitor Blood Sugars four times daily , before meals and at bedtime.   [DISCONTINUED] Continuous Blood Gluc Sensor (DEXCOM G7 SENSOR) MISC Change sensor every 10 days   [DISCONTINUED] insulin aspart (NOVOLOG FLEXPEN) 100 UNIT/ML FlexPen Inject 10-16 Units into the skin 3 (three) times daily with meals.   Continuous Blood Gluc Sensor (DEXCOM G7 SENSOR) MISC Monitor Blood Sugars four times daily , before meals and at bedtime.   insulin aspart (NOVOLOG FLEXPEN) 100 UNIT/ML FlexPen Inject 14-20 Units into the skin 3 (three) times daily with meals.   [DISCONTINUED] insulin glargine, 2 Unit Dial, (TOUJEO MAX SOLOSTAR) 300 UNIT/ML Solostar Pen Inject 35 Units into the skin at bedtime.   No facility-administered encounter medications on file as of 10/19/2022.    ALLERGIES: Allergies  Allergen Reactions   Actos [Pioglitazone] Swelling   Augmentin [Amoxicillin-Pot Clavulanate] Diarrhea, Nausea Only and Other (See Comments)    Caused severe diarrhea  Citrus Other (See Comments)    "Issues with my tongue"- the fleshy part of the fruit    Invokana [Canagliflozin] Other (See Comments)    Yeast infection   Lopid [Gemfibrozil] Other (See Comments)    Made the tongue burn   Metoprolol Other (See Comments)    Headaches    Naproxen Sodium Other (See Comments)     Patient states she "felt loopy"   Penicillins Diarrhea and Other (See Comments)    Violent diarrhea- must have Diflucan   Pineapple Other (See Comments)    "Issues with my tongue"   Statins Other (See Comments)    Joint pain   Tape Itching and Other (See Comments)    Tape, EKG leads, and Band-Aids - Itching and redness   Amlodipine Cough   Clavulanic Acid Nausea Only   Hctz [Hydrochlorothiazide] Rash   Lisinopril Cough   Morphine And Related Itching and Rash    VACCINATION STATUS: Immunization History  Administered Date(s) Administered   Influenza,inj,Quad PF,6+ Mos 07/01/2017, 05/29/2018   Influenza-Unspecified 05/25/2014, 07/03/2016   Tdap 01/26/2010, 01/21/2020    Diabetes She presents for her follow-up diabetic visit. She has type 2 diabetes mellitus. Onset time: Diagnosed at approx age of 38. Her disease course has been stable. There are no hypoglycemic associated symptoms. Associated symptoms include fatigue, foot paresthesias, polydipsia and polyuria. Pertinent negatives for diabetes include no weight loss. There are no hypoglycemic complications. Symptoms are improving. Diabetic complications include a CVA, heart disease (CHF) and peripheral neuropathy. Risk factors for coronary artery disease include obesity, hypertension, sedentary lifestyle, family history, dyslipidemia and diabetes mellitus. Current diabetic treatment includes intensive insulin program. Her weight is fluctuating minimally. She is following a generally unhealthy diet. Meal planning includes avoidance of concentrated sweets. She has not had a previous visit with a dietitian. She participates in exercise intermittently. Her overall blood glucose range is >200 mg/dl. (She presents today with her CGM and logs showing above target glycemic profile overall.  Her POCT A1c today is 8.7%, improving from last visit of 9.1%.  She notes she has been stress eating lately and still has trouble getting 3 meals in per day due to  fatigue from CHF.  She also reports she is sleeping more from fatigue related to CHF and therefore misses meals.  Analysis of her CGM shows TIR 25%, TAR 75%, TBR 0% with a GMI of 8.6%.  She notes her yeast infection is improving since stopping her Jardiance.) An ACE inhibitor/angiotensin II receptor blocker is being taken. She does not see a podiatrist.Eye exam is current.     Review of systems  Constitutional: + stable body weight, current Body mass index is 50.75 kg/m., no fatigue, no subjective hyperthermia, no subjective hypothermia Eyes: no blurry vision, no xerophthalmia ENT: no sore throat, no nodules palpated in throat, no dysphagia/odynophagia, no hoarseness Cardiovascular: no chest pain, no shortness of breath, no palpitations, no leg swelling Respiratory: no cough, no shortness of breath Gastrointestinal: no nausea/vomiting/diarrhea Genitourinary: + frequent UTIs and yeast infections-improving now off Jardiance Musculoskeletal: no muscle/joint aches Skin: no rashes, no hyperemia Neurological: no tremors, + numbness/tingling to bilateral hands and feet due to neuropathy, no dizziness Psychiatric: no depression, no anxiety  Objective:     BP (!) 150/76 (BP Location: Right Arm, Patient Position: Sitting, Cuff Size: Large)   Pulse (!) 53   Ht '5\' 6"'$  (1.676 m)   Wt (!) 314 lb 6.4 oz (142.6 kg)   BMI 50.75 kg/m   Wt Readings  from Last 3 Encounters:  10/19/22 (!) 314 lb 6.4 oz (142.6 kg)  09/18/22 (!) 311 lb 3.2 oz (141.2 kg)  07/20/22 (!) 319 lb 2 oz (144.8 kg)     BP Readings from Last 3 Encounters:  10/19/22 (!) 150/76  09/18/22 (!) 144/68  07/20/22 116/61     Physical Exam- Limited  Constitutional:  Body mass index is 50.75 kg/m. , not in acute distress, normal state of mind Eyes:  EOMI, no exophthalmos Neck: Supple Musculoskeletal: no gross deformities, strength intact in all four extremities, no gross restriction of joint movements Skin:  no rashes, no  hyperemia Neurological: no tremor with outstretched hands    CMP ( most recent) CMP     Component Value Date/Time   NA 135 06/20/2022 0108   NA 137 03/21/2022 1540   K 4.1 06/20/2022 0108   CL 102 06/20/2022 0108   CO2 25 06/20/2022 0108   GLUCOSE 166 (H) 06/20/2022 0108   BUN 16 06/20/2022 0108   BUN 10 03/21/2022 1540   CREATININE 0.86 06/20/2022 0108   CALCIUM 8.7 (L) 06/20/2022 0108   PROT 7.7 09/13/2022 1044   PROT 6.4 03/21/2022 1540   ALBUMIN 3.7 09/13/2022 1044   ALBUMIN 4.1 03/21/2022 1540   AST 17 09/13/2022 1044   ALT 17 09/13/2022 1044   ALKPHOS 88 09/13/2022 1044   BILITOT 0.7 09/13/2022 1044   BILITOT 0.3 03/21/2022 1540   GFRNONAA >60 06/20/2022 0108   GFRAA 125 07/01/2017 1019     Diabetic Labs (most recent): Lab Results  Component Value Date   HGBA1C 8.7 (A) 10/19/2022   HGBA1C 6.9 (H) 06/18/2022   HGBA1C 8.6 (H) 04/18/2021     Lipid Panel ( most recent) Lipid Panel     Component Value Date/Time   CHOL 176 09/13/2022 1045   CHOL 188 03/21/2022 1540   TRIG 123 09/13/2022 1045   HDL 36 (L) 09/13/2022 1045   HDL 39 (L) 03/21/2022 1540   CHOLHDL 4.9 09/13/2022 1045   VLDL 25 09/13/2022 1045   LDLCALC 115 (H) 09/13/2022 1045   LDLCALC 116 (H) 03/21/2022 1540   LABVLDL 33 03/21/2022 1540      Lab Results  Component Value Date   TSH 2.320 12/18/2016           Assessment & Plan:   1) Type 2 diabetes without complication with long-term current use of insulin  She presents today with her CGM and logs showing above target glycemic profile overall.  Her POCT A1c today is 8.7%, improving from last visit of 9.1%.  She notes she has been stress eating lately and still has trouble getting 3 meals in per day due to fatigue from CHF.  She also reports she is sleeping more from fatigue related to CHF and therefore misses meals.  Analysis of her CGM shows TIR 25%, TAR 75%, TBR 0% with a GMI of 8.6%.  She notes her yeast infection is improving since  stopping her Jardiance.  - Belladonna Musson has currently uncontrolled symptomatic type 2 DM since 52 years of age.   -Recent labs reviewed.  - I had a long discussion with her about the progressive nature of diabetes and the pathology behind its complications. -her diabetes is complicated by CHF, neuropathy, CVA and she remains at a high risk for more acute and chronic complications which include CAD, CVA, CKD, retinopathy, and neuropathy. These are all discussed in detail with her.  The following Lifestyle Medicine recommendations according to American  College of Lifestyle Medicine [facility]) were discussed and offered to patient and she agrees to start the journey:  A. Whole Foods, Plant-based plate comprising of fruits and vegetables, plant-based proteins, whole-grain carbohydrates was discussed in detail with the patient.   A list for source of those nutrients were also provided to the patient.  Patient will use only water or unsweetened tea for hydration. B.  The need to stay away from risky substances including alcohol, smoking; obtaining 7 to 9 hours of restorative sleep, at least 150 minutes of moderate intensity exercise weekly, the importance of healthy social connections,  and stress reduction techniques were discussed. C.  A full color page of  Calorie density of various food groups per pound showing examples of each food groups was provided to the patient.  - Nutritional counseling repeated at each appointment due to patients tendency to fall back in to old habits.  - The patient admits there is a room for improvement in their diet and drink choices. -  Suggestion is made for the patient to avoid simple carbohydrates from their diet including Cakes, Sweet Desserts / Pastries, Ice Cream, Soda (diet and regular), Sweet Tea, Candies, Chips, Cookies, Sweet Pastries, Store Bought Juices, Alcohol in Excess of 1-2 drinks a day, Artificial Sweeteners, Coffee Creamer, and "Sugar-free" Products. This  will help patient to have stable blood glucose profile and potentially avoid unintended weight gain.   - I encouraged the patient to switch to unprocessed or minimally processed complex starch and increased protein intake (animal or plant source), fruits, and vegetables.   - Patient is advised to stick to a routine mealtimes to eat 3 meals a day and avoid unnecessary snacks (to snack only to correct hypoglycemia).  - I have approached her with the following individualized plan to manage her diabetes and patient agrees:   -She is advised to continue her Toujeo 35 units SQ nightly and increase her Novolog to 14-20 units TID with meals if glucose is above 90 and she is eating (Specific instructions on how to titrate insulin dosage based on glucose readings given to patient in writing).   -she is encouraged to continue monitoring blood glucose 4 times daily, before meals and before bed, and to call the clinic if she has readings less than 70 or above 300 for 3 tests in a row.  - she is warned not to take insulin without proper monitoring per orders. - Adjustment parameters are given to her for hypo and hyperglycemia in writing.  - her JARDIANCE will be discontinued, risk outweighs benefit for this patient.  Given her account of frequent UTIs and vaginal yeast infections, and skin irritation in her groin, this class of medications is not the best for her.  - she will be considered for incretin therapy as appropriate next visit.  I did encourage her to research GLP1 products.  She will be losing her insurance in the future, therefore will avoid adding such an expensive product at this time.  - Specific targets for  A1c; LDL, HDL, and Triglycerides were discussed with the patient.  2) Blood Pressure /Hypertension:  her blood pressure is controlled to target.   she is advised to continue her current medications including Losartan 50 mg p.o. daily with breakfast, Lasix 40 mg po daily, and Metoprolol 25  mg po daily .  3) Lipids/Hyperlipidemia:    Review of her recent lipid panel from 09/13/22 showed uncontrolled LDL at 115.  She has statin intolerance but is advised to  continue Zetia 10 mg daily.  4)  Weight/Diet:  her Body mass index is 50.75 kg/m.  -  clearly complicating her diabetes care.   she is a candidate for weight loss. I discussed with her the fact that loss of 5 - 10% of her  current body weight will have the most impact on her diabetes management.  Exercise, and detailed carbohydrates information provided  -  detailed on discharge instructions.  5) Chronic Care/Health Maintenance: -she is on ACEI/ARB and has intolerance to Statin medications and is encouraged to initiate and continue to follow up with Ophthalmology, Dentist, Podiatrist at least yearly or according to recommendations, and advised to stay away from smoking. I have recommended yearly flu vaccine and pneumonia vaccine at least every 5 years; moderate intensity exercise for up to 150 minutes weekly; and sleep for at least 7 hours a day.  - she is advised to maintain close follow up with [place], Gilmore Laroche, FNP for primary care needs, as well as her other providers for optimal and coordinated care.     I spent  53  minutes in the care of the patient today including review of labs from [facility], Lipids, Thyroid Function, Hematology (current and previous including abstractions from other facilities); face-to-face time discussing  her blood glucose readings/logs, discussing hypoglycemia and hyperglycemia episodes and symptoms, medications doses, her options of short and long term treatment based on the latest standards of care / guidelines;  discussion about incorporating lifestyle medicine;  and documenting the encounter. Risk reduction counseling performed per USPSTF guidelines to reduce obesity and cardiovascular risk factors.     Please refer to Patient Instructions for Blood Glucose Monitoring and Insulin/Medications  Dosing Guide"  in media tab for additional information. Please  also refer to " Patient Self Inventory" in the Media  tab for reviewed elements of pertinent patient history.  Fawn Knipple participated in the discussions, expressed understanding, and voiced agreement with the above plans.  All questions were answered to her satisfaction. she is encouraged to contact clinic should she have any questions or concerns prior to her return visit.     Follow up plan: - Return in about 3 months (around 01/19/2023) for Diabetes F/U with A1c in office, No previsit labs, Bring meter and logs.   Rayetta Pigg, [facility] [address] [place], [place] [address] Phone: [phone] Fax: [phone]  10/19/2022, 11:09 AM

## 2022-10-23 ENCOUNTER — Encounter: Payer: Self-pay | Admitting: Family Medicine

## 2022-10-23 ENCOUNTER — Ambulatory Visit (INDEPENDENT_AMBULATORY_CARE_PROVIDER_SITE_OTHER): Payer: BC Managed Care – PPO | Admitting: Family Medicine

## 2022-10-23 ENCOUNTER — Ambulatory Visit (INDEPENDENT_AMBULATORY_CARE_PROVIDER_SITE_OTHER): Payer: BC Managed Care – PPO

## 2022-10-23 VITALS — BP 158/83 | HR 53 | Temp 98.1°F | Ht 66.0 in | Wt 318.0 lb

## 2022-10-23 DIAGNOSIS — S91109A Unspecified open wound of unspecified toe(s) without damage to nail, initial encounter: Secondary | ICD-10-CM

## 2022-10-23 DIAGNOSIS — Z794 Long term (current) use of insulin: Secondary | ICD-10-CM

## 2022-10-23 DIAGNOSIS — E1142 Type 2 diabetes mellitus with diabetic polyneuropathy: Secondary | ICD-10-CM | POA: Diagnosis not present

## 2022-10-23 DIAGNOSIS — S91104A Unspecified open wound of right lesser toe(s) without damage to nail, initial encounter: Secondary | ICD-10-CM

## 2022-10-23 DIAGNOSIS — I110 Hypertensive heart disease with heart failure: Secondary | ICD-10-CM | POA: Diagnosis not present

## 2022-10-23 DIAGNOSIS — E1159 Type 2 diabetes mellitus with other circulatory complications: Secondary | ICD-10-CM

## 2022-10-23 DIAGNOSIS — F515 Nightmare disorder: Secondary | ICD-10-CM

## 2022-10-23 DIAGNOSIS — N39498 Other specified urinary incontinence: Secondary | ICD-10-CM

## 2022-10-23 DIAGNOSIS — F4312 Post-traumatic stress disorder, chronic: Secondary | ICD-10-CM

## 2022-10-23 DIAGNOSIS — I5032 Chronic diastolic (congestive) heart failure: Secondary | ICD-10-CM | POA: Diagnosis not present

## 2022-10-23 DIAGNOSIS — F4310 Post-traumatic stress disorder, unspecified: Secondary | ICD-10-CM

## 2022-10-23 DIAGNOSIS — I152 Hypertension secondary to endocrine disorders: Secondary | ICD-10-CM

## 2022-10-23 MED ORDER — DOXYCYCLINE HYCLATE 100 MG PO TABS: 100.0000 mg | ORAL_TABLET | Freq: Two times a day (BID) | ORAL | 0 refills | Status: AC

## 2022-10-23 MED ORDER — OXYBUTYNIN CHLORIDE ER 10 MG PO TB24: 10.0000 mg | ORAL_TABLET | Freq: Every day | ORAL | 1 refills | Status: DC

## 2022-10-23 MED ORDER — METOPROLOL SUCCINATE ER 50 MG PO TB24: 50.0000 mg | ORAL_TABLET | Freq: Every day | ORAL | 0 refills | Status: DC

## 2022-10-23 NOTE — Addendum Note (Signed)
Addended by: Donzetta Kohut on: 10/23/2022 04:54 PM   Modules accepted: Orders

## 2022-10-23 NOTE — Progress Notes (Addendum)
New Patient Office Visit  Subjective   Patient ID: Kathryn Jimenez, female    DOB: 1970-12-07  Age: 52 y.o. MRN: [id number]  CC:  Chief Complaint  Patient presents with   Medical Management of Chronic Issues    HPI Kathryn Jimenez presents to establish care with new provider and management of chronic conditions   Hypertension Established with Cardiology, Dr. Harrington Challenger. Has an appointment at the end of March. Has been taking metop and losartan for BP. Is taking prazosin for PTSD nightmares  Established with Endo, recently seen, chart reviewed.   Dyslipidemia  No compliance issues with zetia. Follows with cardiology, labs recently ordered with endocrinology .   Diabetes  Followed by Endocrinology. Has been using dexcom and started on insulin. Denies any compliance issue.   Established with ObGyn, Dr. Nelda Marseille  Depression/Anxiety/ PTSD Followed at [facility]. Takes ativan as needed. States she only gets 5/month so only takes them as needed. Usually only takes a half. States that with IUD and ablation her anxiety is much better surrounding her menstrual cycles. Is taking prazosin for nightmares. States she does not see a big improvement with medication. Endorses side effect of hypotension. Describes symptoms as lightheaded and seeing stars.   Incontinence  On current medications, Helping a lot. States that she is only getting up 2-3 times per night. States before she had stress incontinence and would have episodes of incontinence with coughing  Wound  Thought that she broke her toes in September 2023. Does not remember any trauma. Has not had imaging. Pain has gone.  Has not tried anything to make it better.  Two nights ago was cutting her toenail and believes she cut too deeply.  Has been putting silvadine and gauze on it. Has not been seen by podiatry recently.   Outpatient Encounter Medications as of 10/23/2022  Medication Sig   ACCU-CHEK GUIDE test strip    acetaminophen (TYLENOL) 500 MG  tablet Take 500 mg by mouth every 6 (six) hours as needed for mild pain or moderate pain.   albuterol (VENTOLIN HFA) 108 (90 Base) MCG/ACT inhaler 2 PUFFS INTO THE LUNGS EVERY 6 HOURS AS NEEDED FOR WHEEZING OR SHORTNESS OF BREATH. Strength: 108 (90 Base) MCG/ACT   buPROPion (WELLBUTRIN XL) 150 MG 24 hr tablet Take 150 mg by mouth daily.   clobetasol cream (TEMOVATE) [id number] % Apply 1 application topically 2 (two) times daily as needed (scalp psoriasis).   clopidogrel (PLAVIX) 75 MG tablet Take 1 tablet (75 mg total) by mouth daily.   Continuous Blood Gluc Sensor (DEXCOM G7 SENSOR) MISC Monitor Blood Sugars four times daily , before meals and at bedtime.   cycloSPORINE (RESTASIS) 0.05 % ophthalmic emulsion Place 1 drop into both eyes 2 (two) times daily.   diphenhydrAMINE (BENADRYL) 25 mg capsule Take 25 mg by mouth every 6 (six) hours as needed for allergies.   ezetimibe (ZETIA) 10 MG tablet Take 1 tablet (10 mg total) by mouth daily.   fluconazole (DIFLUCAN) 150 MG tablet Take one tablet once then repeat in 3 days if needed   FLUoxetine (PROZAC) 40 MG capsule Take 40 mg by mouth daily.   furosemide (LASIX) 40 MG tablet Take 1 tablet (40 mg total) by mouth in the morning.   guaiFENesin (MUCINEX) 600 MG 12 hr tablet Take 1 tablet (600 mg total) by mouth 2 (two) times daily. (Patient taking differently: Take 600 mg by mouth 2 (two) times daily as needed for to loosen phlegm or cough.)  Incontinence Supply Disposable (PREVAIL IB FULL MAT BRIEF 2XL) MISC 1 each by Does not apply route every 2 (two) hours as needed.   insulin aspart (NOVOLOG FLEXPEN) 100 UNIT/ML FlexPen Inject 14-20 Units into the skin 3 (three) times daily with meals.   insulin glargine (LANTUS SOLOSTAR) 100 UNIT/ML Solostar Pen Inject 35 Units into the skin at bedtime.   Insulin Pen Needle (CAREFINE PEN NEEDLES) 31G X 6 MM MISC Use to inject insulin 4 times daily   levonorgestrel (LILETTA, 52 MG,) 20.1 MCG/DAY IUD 1 each by Intrauterine  route once. Inserted 04/19/21   LORazepam (ATIVAN) 0.5 MG tablet Take 0.25-0.5 mg by mouth daily as needed for anxiety.   losartan (COZAAR) 50 MG tablet TAKE 1 TABLET ONCE DAILY.   metoprolol succinate (TOPROL-XL) 50 MG 24 hr tablet Take 1 tablet (50 mg total) by mouth daily. Take with or immediately following a meal.   nystatin (MYCOSTATIN/NYSTOP) powder Apply 1 application  topically daily as needed (for fungal infections).   OVER THE COUNTER MEDICATION Take 1-2 drops by mouth daily as needed (Thyroid Function). Potassium Iodide 4 %; Iodine 2 %; Distilled water   oxybutynin (DITROPAN-XL) 10 MG 24 hr tablet TAKE 1 TABLET BY MOUTH AT BEDTIME.   OXYGEN Inhale 4 L/min into the lungs continuous.   potassium chloride (KLOR-CON) 10 MEQ tablet Take 2 tablets (20 mEq total) by mouth daily.   prazosin (MINIPRESS) 1 MG capsule TAKE 1 CAPSULE BY MOUTH AT BEDTIME.   terconazole (TERAZOL 3) 0.8 % vaginal cream Place 1 applicator vaginally at bedtime.   valACYclovir (VALTREX) 500 MG tablet Take 1 tablet (500 mg total) by mouth daily.   Vitamin D, Ergocalciferol, (DRISDOL) 1.25 MG (50000 UNIT) CAPS capsule Take 1 capsule (50,000 Units total) by mouth 2 (two) times a week. (Patient taking differently: Take 50,000 Units by mouth every 7 (seven) days.)   [DISCONTINUED] benzonatate (TESSALON PERLES) 100 MG capsule Take 1 capsule (100 mg total) by mouth 3 (three) times daily as needed. (Patient taking differently: Take 100 mg by mouth 3 (three) times daily as needed for cough.)   No facility-administered encounter medications on file as of 10/23/2022.    Past Medical History:  Diagnosis Date   Allergy    Anginal pain ([place])    Anxiety    Arthritis    Asthma    Bladder disorder, other    Borderline personality disorder ([place])    Carotid artery disease ([place]) 03/05/2017   Depression    Diabetes mellitus without complication (HCC)    GERD (gastroesophageal reflux disease)    Headache    Hyperlipidemia     Hypertension    Kidney stones    Mucous retention cyst of maxillary sinus 08/31/2021   Noted on head CT   Neuromuscular disorder (HCC)    Neuropathy   PTSD (post-traumatic stress disorder)    Shortness of breath    Sleep apnea    Stroke ([place]) 2018   Thyroid disease    nodule    Past Surgical History:  Procedure Laterality Date   CARPAL TUNNEL RELEASE     left 2015   CERVICAL FUSION Bilateral 2003   C 5-6 with titanium screws   CHOLECYSTECTOMY     COLONOSCOPY WITH PROPOFOL N/A 06/24/2014   Procedure: COLONOSCOPY WITH PROPOFOL;  Surgeon: Rogene Houston, MD;  Location: [facility];  Service: Endoscopy;  Laterality: N/A;  cecum time in 0810    time out   0821    total time  11 minutes   DILITATION & CURRETTAGE/HYSTROSCOPY WITH HYDROTHERMAL ABLATION N/A 04/19/2021   Procedure: DILATATION & CURETTAGE/HYSTEROSCOPY WITH HYDROTHERMAL ABLATION;  Surgeon: Janyth Pupa, DO;  Location: [facility];  Service: Gynecology;  Laterality: N/A;   INTRAUTERINE DEVICE (IUD) INSERTION N/A 04/19/2021   Procedure: INTRAUTERINE DEVICE (IUD) INSERTION;  Surgeon: Janyth Pupa, DO;  Location: [facility];  Service: Gynecology;  Laterality: N/A;   POLYPECTOMY N/A 06/24/2014   Procedure: RECTAL POLYPECTOMY;  Surgeon: Rogene Houston, MD;  Location: [facility];  Service: Endoscopy;  Laterality: N/A;   RIGHT/LEFT HEART CATH AND CORONARY ANGIOGRAPHY N/A 06/19/2022   Procedure: RIGHT/LEFT HEART CATH AND CORONARY ANGIOGRAPHY;  Surgeon: Sherren Mocha, MD;  Location: [facility];  Service: Cardiovascular;  Laterality: N/A;   [place] EXTRACTION Bilateral 2001 and 1990s   top and bottom     Family History  Problem Relation Age of Onset   COPD Mother    Arthritis Mother    Asthma Mother    Depression Mother    Diabetes Mother    Heart disease Mother    Hyperlipidemia Mother    Hypertension Mother    Mental illness Mother    Skin cancer Mother    Lung cancer Father     Arthritis Father    Depression Father    Drug abuse Father    Early death Father    Kidney disease Father    Mental illness Father    Colon cancer Sister    Birth defects Sister    COPD Sister    Hyperlipidemia Sister    Learning disabilities Sister    Breast cancer Sister    Breast cancer Sister    Colon cancer Sister    Alcohol abuse Paternal Grandmother    Diabetes Paternal Grandmother    Alcohol abuse Paternal Grandfather     Social History   Socioeconomic History   Marital status: Married    Spouse name: [name]   Number of children: 0   Years of education: HS   Highest education level: Not on file  Occupational History   Occupation: Disabled  Tobacco Use   Smoking status: Never   Smokeless tobacco: Never  Vaping Use   Vaping Use: Never used  Substance and Sexual Activity   Alcohol use: No    Comment: Rarely   Drug use: No   Sexual activity: Not Currently    Birth control/protection: I.U.D.  Other Topics Concern   Not on file  Social History Narrative   Lives at home with husband.   Right-handed.   3-4 cups caffeine per day.   Social Determinants of Health   Financial Resource Strain: Medium Risk (07/20/2022)   Overall Financial Resource Strain (CARDIA)    Difficulty of Paying Living Expenses: Somewhat hard  Food Insecurity: Food Insecurity Present (07/20/2022)   Hunger Vital Sign    Worried About Running Out of Food in the Last Year: Sometimes true    Ran Out of Food in the Last Year: Sometimes true  Transportation Needs: Unmet Transportation Needs (07/20/2022)   PRAPARE - Hydrologist (Medical): Yes    Lack of Transportation (Non-Medical): Yes  Physical Activity: Inactive (07/20/2022)   Exercise Vital Sign    Days of Exercise per Week: 0 days    Minutes of Exercise per Session: 0 min  Stress: No Stress Concern Present (04/03/2021)   [place] -  Occupational Stress Questionnaire    Feeling of  Stress : Only a little  Social Connections: Moderately Isolated (07/20/2022)   Social Connection and Isolation Panel [NHANES]    Frequency of Communication with Friends and Family: Three times a week    Frequency of Social Gatherings with Friends and Family: Once a week    Attends Religious Services: Never    Marine scientist or Organizations: No    Attends Archivist Meetings: Never    Marital Status: Married  Human resources officer Violence: Unknown (07/20/2022)   Humiliation, Afraid, Rape, and Kick questionnaire    Fear of Current or Ex-Partner: No    Emotionally Abused: Patient refused    Physically Abused: No    Sexually Abused: No    ROS As per HPI  Objective   BP (!) 153/74   Pulse (!) 53   Temp 98.1 F (36.7 C)   Ht '5\' 6"'$  (1.676 m)   Wt (!) 318 lb (144.2 kg)   SpO2 98%   BMI 51.33 kg/m   Physical Exam Constitutional:      General: She is not in acute distress.    Appearance: Normal appearance. She is not ill-appearing, toxic-appearing or diaphoretic.  Cardiovascular:     Rate and Rhythm: Normal rate.     Pulses: Normal pulses.          Dorsalis pedis pulses are 2+ on the right side and 2+ on the left side.     Heart sounds: Normal heart sounds. No murmur heard.    No gallop.  Pulmonary:     Effort: Pulmonary effort is normal. No respiratory distress.     Breath sounds: Normal breath sounds. No stridor. No wheezing, rhonchi or rales.  Musculoskeletal:       Feet:  Feet:     Right foot:     Skin integrity: Ulcer, erythema, warmth and fissure present.     Toenail Condition: Right toenails are abnormally thick, long and ingrown. Fungal disease present.    Left foot:     Toenail Condition: Left toenails are abnormally thick, long and ingrown. Fungal disease present. Skin:    General: Skin is warm.     Capillary Refill: Capillary refill takes less than 2 seconds.  Neurological:     General: No focal deficit present.     Mental Status: She is alert  and oriented to person, place, and time. Mental status is at baseline.     Motor: No weakness.  Psychiatric:        Mood and Affect: Mood normal.        Behavior: Behavior normal.        Thought Content: Thought content normal.        Judgment: Judgment normal.    Assessment & Plan:  1. Type 2 diabetes mellitus with diabetic polyneuropathy, with long-term current use of insulin (HCC) Labs as below. Will communicate results to patient once available.  Pt following with endocrinology. Reviewed charts and lab results from endocrinology visit.  - ACCU-CHEK GUIDE test strip - Microalbumin / creatinine urine ratio - AMB Referral to Pharmacy Medication Management - AMB Referral to [place] (ACO Patients)  2. Chronic heart failure with preserved ejection fraction (HFpEF) (HCC) Will trial off prazosin given CHF diagnosis. Management completed by cardiology. Has follow up at the end of the month.   3. Hypertension associated with type 2 diabetes mellitus ([place]) Refill as below. Pt having side effect of orthostatic hypotension. Negative  for orthostatic hypotension in clinic today.  Will trial off of prazosin to determine if symptoms are due to medication. Pt has appointment with cardiology at the end of the month where she should follow up with medication adjustments.   Elevated BP today in office. Discussed with patient monitoring BP at home. Provided BP log to patient in clinic today. Instructed pt to take BP first thing in the morning after sitting for 5 minutes with feet flat on the floor, arm at heart level. Discussed with patient options for BP cuff at [facility], [employer] [facility], [place]. Pt verbalized they were able to obtain BP cuff. Will review measurements with patient at follow up.   - metoprolol succinate (TOPROL-XL) 50 MG 24 hr tablet; Take 1 tablet (50 mg total) by mouth daily. Take with or immediately following a meal.  Dispense: 30 tablet; Refill: 0 - AMB Referral to  Pharmacy Medication Management - AMB Referral to [place] (ACO Patients)  4. Open wound of toe, initial encounter Referral and imaging placed as below. Will review results of Xray with patient once available.  Medication as below. Due to patient allergies and previous intolerance decided to utilize doxycyline. Discussed with patient taking with food and probiotics to mitigate side effects.  - Ambulatory referral to Podiatry - DG Toe 2nd Right; Future - doxycycline (VIBRA-TABS) 100 MG tablet; Take 1 tablet (100 mg total) by mouth 2 (two) times daily for 14 days.  Dispense: 28 tablet; Refill: 0  5. Other urinary incontinence Medication refilled as below. Per pt, symptoms controlled. Will continue current regimen.  - oxybutynin (DITROPAN-XL) 10 MG 24 hr tablet; Take 1 tablet (10 mg total) by mouth at bedtime.  Dispense: 30 tablet; Refill: 1  6. PTSD (post-traumatic stress disorder) 7. Nightmares associated with chronic post-traumatic stress disorder Per pt, pt failed multiple previous treatments. Is being treated at [facility], but previous PCP was prescribing prazosin. Discussed with pt completing a trial without prazosin as she states she did not see a lot of improvement in her nightmares on it. Will trial off of prazosin to determine efficacy for patient and to mitigate orthostatic hypotension side effect. Additionally, pt has diagnosis of CHF, per UTD prazosin can exacerbate myocardial dysfunction    The above assessment and management plan was discussed with the patient. The patient verbalized understanding of and has agreed to the management plan using shared-decision making. Patient is aware to call the clinic if they develop any new symptoms or if symptoms fail to improve or worsen. Patient is aware when to return to the clinic for a follow-up visit. Patient educated on when it is appropriate to go to the emergency department.   Donzetta Kohut, DNP-FNP [facility] [address] [place], [place] [address] [phone]

## 2022-10-24 ENCOUNTER — Telehealth: Payer: Self-pay

## 2022-10-24 DIAGNOSIS — R0902 Hypoxemia: Secondary | ICD-10-CM | POA: Diagnosis not present

## 2022-10-24 DIAGNOSIS — I509 Heart failure, unspecified: Secondary | ICD-10-CM | POA: Diagnosis not present

## 2022-10-24 DIAGNOSIS — J189 Pneumonia, unspecified organism: Secondary | ICD-10-CM | POA: Diagnosis not present

## 2022-10-24 LAB — MICROALBUMIN / CREATININE URINE RATIO
Creatinine, Urine: 42.2 mg/dL
Microalb/Creat Ratio: <7 mg/g creat (ref 0–29)
Microalbumin, Urine: <3 ug/mL

## 2022-10-24 NOTE — Progress Notes (Signed)
  Care Coordination  Note  10/24/2022 Name: Samyria Esh MRN: [id number] DOB: [birth year]  Dennisha Dimmitt is a 52 y.o. year old female who is a primary care patient of Pryor Ochoa, [place]. I reached out to Reign Luckett by phone today to offer care coordination services.      Ms. Mckibben was given information about Care Coordination services today including:  The Care Coordination services include support from the care team which includes your Nurse Coordinator, Clinical Social Worker, or Pharmacist.  The Care Coordination team is here to help remove barriers to the health concerns and goals most important to you. Care Coordination services are voluntary and the patient may decline or stop services at any time by request to their care team member.   Patient did not agree to participate in care coordination services at this time.  Follow up plan: Patient declines further follow up or participation in care coordination services.   Noreene Larsson, [place], [place] [address] Direct Dial: [phone] [email or web address]

## 2022-10-26 ENCOUNTER — Telehealth: Payer: Self-pay | Admitting: *Deleted

## 2022-10-26 ENCOUNTER — Ambulatory Visit: Payer: Self-pay

## 2022-10-26 NOTE — Patient Instructions (Signed)
Visit Information  Thank you for taking time to visit with me today. Please don't hesitate to contact me if I can be of assistance to you.   Following are the goals we discussed today:   Goals Addressed             This Visit's Progress    COMPLETED: Care Coordination Activities       Care Coordination Interventions: Discussed patient has an appointment in [facility] with podiatry and needs assistance with transportation Assessed for patients ability to access Medicaid transportation Determined the patient is able to access [employer] and Medicaid transportation but wants assistance with transportation that will stop when she needs to use the restroom due to the long trip to [facility] the patient that unfortunately there were no transportation resources this SW is aware of that will stop due to being a ride share service Assessed for ability to transfer to a podiatrist closer to home; patient reports this is the closest podiatrist that accepts her insurance coverage No other care coordination needs at this time; patient will utilize her health plan benefit         If you are experiencing a Mental Health or [place] or need someone to talk to, please call the [facility]: [phone] call [phone]  Patient verbalizes understanding of instructions and care plan provided today and agrees to view in [place]. Active MyChart status and patient understanding of how to access instructions and care plan via MyChart confirmed with patient.     No further follow up required: Please contact your primary care provider as needed.  Daneen Schick, BSW, CDP Social Worker, Certified Dementia Practitioner [facility]  Care Coordination [phone]

## 2022-10-26 NOTE — Patient Outreach (Signed)
  Care Coordination   Initial Visit Note   10/26/2022 Name: Kathryn Jimenez MRN: [id number] DOB: 1970-12-03  Kathryn Jimenez is a 52 y.o. year old female who sees Milian, Gilmore Laroche, FNP for primary care. I spoke with  Janete Scales by phone today.  What matters to the patients health and wellness today?  Transportation resources    Goals Addressed             This Visit's Progress    COMPLETED: Care Coordination Activities       Care Coordination Interventions: Discussed patient has an appointment in [place] with podiatry and needs assistance with transportation Assessed for patients ability to access Medicaid transportation Determined the patient is able to access [employer] and Medicaid transportation but wants assistance with transportation that will stop when she needs to use the restroom due to the long trip to [facility] the patient that unfortunately there were no transportation resources this SW is aware of that will stop due to being a ride share service Assessed for ability to transfer to a podiatrist closer to home; patient reports this is the closest podiatrist that accepts her insurance coverage No other care coordination needs at this time; patient will utilize her health plan benefit         SDOH assessments and interventions completed:  Yes  SDOH Interventions Today    Flowsheet Row Most Recent Value  SDOH Interventions   Transportation Interventions Other (Comment)  [Patient has [employer] but wants a different option that will stop for bathroom breaks. Advised the patient there are no ride share options to provide stops for bathroom use]        Care Coordination Interventions:  Yes, provided   Interventions Today    Flowsheet Row Most Recent Value  Chronic Disease   Chronic disease during today's visit Diabetes  General Interventions   General Interventions Discussed/Reviewed General Interventions Discussed, Community Resources        Follow  up plan: No further intervention required.   Encounter Outcome:  Pt. Visit Completed   Daneen Schick, BSW, CDP Social Worker, Certified Dementia Practitioner [facility]  Care Coordination [phone]

## 2022-10-26 NOTE — Progress Notes (Signed)
  Care Coordination   Note   10/26/2022 Name: Sharisa Rounds MRN: [id number] DOB: 1970/11/10  Cashae Underwood is a 52 y.o. year old female who sees Milian, Gilmore Laroche, FNP for primary care. I reached out to Jeniyah Maduro by phone today to offer care coordination services.  Ms. Slingerland was given information about Care Coordination services today including:   The Care Coordination services include support from the care team which includes your Nurse Coordinator, Clinical Social Worker, or Pharmacist.  The Care Coordination team is here to help remove barriers to the health concerns and goals most important to you. Care Coordination services are voluntary, and the patient may decline or stop services at any time by request to their care team member.   Care Coordination Consent Status: Patient agreed to services and verbal consent obtained.   Follow up plan:  Telephone appointment with care coordination team member scheduled for:  10/26/22  Encounter Outcome:  Pt. Scheduled  [facility]  Direct Dial: [phone]

## 2022-10-29 ENCOUNTER — Telehealth: Payer: Self-pay | Admitting: Family Medicine

## 2022-10-29 ENCOUNTER — Other Ambulatory Visit: Payer: Self-pay | Admitting: Family Medicine

## 2022-10-29 MED ORDER — LOSARTAN POTASSIUM 50 MG PO TABS: 50.0000 mg | ORAL_TABLET | Freq: Every day | ORAL | 2 refills | Status: DC

## 2022-10-29 NOTE — Telephone Encounter (Signed)
Patient viewed xray results on Mychart and saw that it was negative for infection but thought that she was getting the xray because she thought that she broke her toes a few months back. Wants to know if that's what you were looking for or just the infection?

## 2022-10-29 NOTE — Telephone Encounter (Signed)
Patient would like to discuss x ray results further . Please call back and advise.

## 2022-10-29 NOTE — Telephone Encounter (Signed)
  Prescription Request  10/29/2022  Is this a "Controlled Substance" medicine? no  Have you seen your PCP in the last 2 weeks? Seen 3/5  If YES, route message to pool  -  If NO, patient needs to be scheduled for appointment.  What is the name of the medication or equipment?  ezetimibe (ZETIA) 10 MG tablet   losartan (COZAAR) 50 MG tablet    Have you contacted your pharmacy to request a refill? yes   Which pharmacy would you like this sent to? [facility]   Patient is aware that Zetia has not been called in by a provider here, said that she discussed with PCP at last appt and told her that she needed a refill at that time   Patient notified that their request is being sent to the clinical staff for review and that they should receive a response within 2 business days.

## 2022-10-30 ENCOUNTER — Ambulatory Visit: Payer: BC Managed Care – PPO | Admitting: Podiatry

## 2022-10-30 MED ORDER — EZETIMIBE 10 MG PO TABS: 10.0000 mg | ORAL_TABLET | Freq: Every day | ORAL | 3 refills | Status: DC

## 2022-10-30 NOTE — Telephone Encounter (Signed)
Left detailed message per signed DPR. Encouraged call back if there are any questions. 

## 2022-11-01 ENCOUNTER — Ambulatory Visit: Payer: Medicaid Other | Admitting: Podiatry

## 2022-11-01 ENCOUNTER — Telehealth: Payer: Self-pay | Admitting: Podiatry

## 2022-11-01 DIAGNOSIS — B351 Tinea unguium: Secondary | ICD-10-CM | POA: Diagnosis not present

## 2022-11-01 DIAGNOSIS — M79675 Pain in left toe(s): Secondary | ICD-10-CM

## 2022-11-01 DIAGNOSIS — M79674 Pain in right toe(s): Secondary | ICD-10-CM | POA: Diagnosis not present

## 2022-11-01 DIAGNOSIS — E1142 Type 2 diabetes mellitus with diabetic polyneuropathy: Secondary | ICD-10-CM

## 2022-11-01 DIAGNOSIS — S90221A Contusion of right lesser toe(s) with damage to nail, initial encounter: Secondary | ICD-10-CM

## 2022-11-01 NOTE — Telephone Encounter (Signed)
Pt called and was not given the after visit summary and needed the instructions. She could not come back to pick it up.  I asked pt if she had access to mychart and she did and I told her to check my chart and it should be under the after visit summary or at the bottom of his office note and if she had an issue finding it to please call us back.

## 2022-11-01 NOTE — Progress Notes (Signed)
  Subjective:  Patient ID: Kathryn Jimenez, female    DOB: 04-May-1971,  MRN: [id number]  Chief Complaint  Patient presents with   Diabetes    Diabetic foot care   Nail Problem    Thick painful nails - right 2nd toenail is barely hanging on    52 y.o. female presents with the above complaint. History confirmed with patient.  She damage to the right second toenail and it is becoming loose noted bleeding and presented for evaluation.  The remaining nails are thickened elongated painful and discolored, she has quite a bit of numbness in her feet, she has type 2 diabetes which is currently poorly controlled  Objective:  Physical Exam: warm, good capillary refill, no trophic changes or ulcerative lesions, normal DP and PT pulses, and abnormal sensory exam, diffuse neuropathy noted. Left Foot: dystrophic yellowed discolored nail plates with subungual debris Right Foot: dystrophic yellowed discolored nail plates with subungual debris and right second toenail bleeding and partial lysis of nail, well attached distally and medially  Assessment:   1. Pain due to onychomycosis of toenails of both feet   2. Diabetic polyneuropathy associated with type 2 diabetes mellitus ([place])   3. Contusion of lesser toe of right foot with damage to nail, initial encounter      Plan:  Patient was evaluated and treated and all questions answered.  Patient educated on diabetes. Discussed proper diabetic foot care and discussed risks and complications of disease. Educated patient in depth on reasons to return to the office immediately should he/she discover anything concerning or new on the feet. All questions answered. Discussed proper shoes as well.   Discussed the etiology and treatment options for the condition in detail with the patient.  Recommended debridement of the nails today. Sharp and mechanical debridement performed of all painful and mycotic nails today. Nails debrided in length and thickness using a nail  nipper to level of comfort. Discussed treatment options including appropriate shoe gear. Follow up as needed for painful nails.  Regarding the right second toenail she has had damage to the nail plate.  There is bleeding present.  I recommended avulsion of the remaining portion of the nail.  Following prep with Betadine the nail plate of the right second toe was removed and avulsed, no matricectomy was completed or carried out.  Discussed possibility of permanent nail damage, regrowth of nail and permanent dystrophy.  Return in about 3 months (around 02/01/2023) for at risk diabetic foot care.

## 2022-11-01 NOTE — Patient Instructions (Signed)

## 2022-11-14 ENCOUNTER — Encounter: Payer: Self-pay | Admitting: Nurse Practitioner

## 2022-11-14 NOTE — Telephone Encounter (Signed)
Have you seen an PA request for her Dexcom G7?

## 2022-11-16 ENCOUNTER — Ambulatory Visit: Payer: Medicaid Other | Attending: Internal Medicine | Admitting: Internal Medicine

## 2022-11-16 ENCOUNTER — Encounter: Payer: Self-pay | Admitting: Internal Medicine

## 2022-11-16 VITALS — BP 145/72 | HR 86 | Ht 66.0 in | Wt 314.0 lb

## 2022-11-16 DIAGNOSIS — I2510 Atherosclerotic heart disease of native coronary artery without angina pectoris: Secondary | ICD-10-CM | POA: Diagnosis not present

## 2022-11-16 MED ORDER — LOSARTAN POTASSIUM 50 MG PO TABS: 50.0000 mg | ORAL_TABLET | Freq: Two times a day (BID) | ORAL | 3 refills | Status: DC

## 2022-11-16 NOTE — Progress Notes (Unsigned)
Cardiology Office Note   Date:  11/16/2022   ID:  Kathryn Jimenez, DOB [birth year], MRN [id number]  PCP:  Pryor Ochoa, FNP  Cardiologist:   Dorris Carnes, MD   Pateint presents for follow up of CAD    History of Present Illness: Kathryn Jimenez is a 52 y.o. female with a history of CAD.  Cath in Oct 2023 showed high graded RPDA  not amenable to PCI; mild nonobstructive CAD elsewhere. Pt also with hx of HFpEF, SVT, HTN, T2DM, HL, CVA (2018) morbid obesity, PTSD  Pt admitted ot [facility] in Oct 2023 for resp failure (pneumonia, PFpEF)  Echo sowed LVEF 60 to 65% with Gr II diastolic dysfunciton, mildly dilated RV.  Diuresed with lasix Started on losartan and Jardiance.  In Oct she presented to [facility] ER with  palpitations and CP  Found to be in  SVT  COnverted with adenosine.   Trop 1254   Rx with IV lasix    With troponin bump she underwent  R and L heart cath    This showed 90% R PDA   LVEFP 13   She was last seen in cardiology in Nov 2023   Since seen she says her breathing is stable  Uses O2 at night    Denies CP   Does give out some with activity. Pt denies heart racing like in when in SVT   Notes only occasional flips  Does report increased stress  Has been dropped by Dr Chalmers Cater   Now with Dr Marinus Maw   On 35 NPH and 14 regular.  Hoping to be able to decrease dose    Current Meds  Medication Sig   ACCU-CHEK GUIDE test strip    acetaminophen (TYLENOL) 500 MG tablet Take 500 mg by mouth every 6 (six) hours as needed for mild pain or moderate pain.   albuterol (VENTOLIN HFA) 108 (90 Base) MCG/ACT inhaler 2 PUFFS INTO THE LUNGS EVERY 6 HOURS AS NEEDED FOR WHEEZING OR SHORTNESS OF BREATH. Strength: 108 (90 Base) MCG/ACT   buPROPion (WELLBUTRIN XL) 150 MG 24 hr tablet Take 150 mg by mouth daily.   clobetasol cream (TEMOVATE) [id number] % Apply 1 application topically 2 (two) times daily as needed (scalp psoriasis).   clopidogrel (PLAVIX) 75 MG tablet Take 1 tablet (75 mg total) by  mouth daily.   Continuous Blood Gluc Sensor (DEXCOM G7 SENSOR) MISC Monitor Blood Sugars four times daily , before meals and at bedtime.   cycloSPORINE (RESTASIS) 0.05 % ophthalmic emulsion Place 1 drop into both eyes 2 (two) times daily.   diphenhydrAMINE (BENADRYL) 25 mg capsule Take 25 mg by mouth every 6 (six) hours as needed for allergies.   ezetimibe (ZETIA) 10 MG tablet Take 1 tablet (10 mg total) by mouth daily.   fluconazole (DIFLUCAN) 150 MG tablet Take one tablet once then repeat in 3 days if needed   FLUoxetine (PROZAC) 40 MG capsule Take 40 mg by mouth daily.   furosemide (LASIX) 40 MG tablet Take 1 tablet (40 mg total) by mouth in the morning.   guaiFENesin (MUCINEX) 600 MG 12 hr tablet Take 1 tablet (600 mg total) by mouth 2 (two) times daily. (Patient taking differently: Take 600 mg by mouth 2 (two) times daily as needed for to loosen phlegm or cough.)   Incontinence Supply Disposable (PREVAIL IB FULL MAT BRIEF 2XL) MISC 1 each by Does not apply route every 2 (two) hours as needed.   insulin aspart (NOVOLOG FLEXPEN) 100  UNIT/ML FlexPen Inject 14-20 Units into the skin 3 (three) times daily with meals.   insulin glargine (LANTUS SOLOSTAR) 100 UNIT/ML Solostar Pen Inject 35 Units into the skin at bedtime.   Insulin Pen Needle (CAREFINE PEN NEEDLES) 31G X 6 MM MISC Use to inject insulin 4 times daily   levonorgestrel (LILETTA, 52 MG,) 20.1 MCG/DAY IUD 1 each by Intrauterine route once. Inserted 04/19/21   LORazepam (ATIVAN) 0.5 MG tablet Take 0.25-0.5 mg by mouth daily as needed for anxiety.   losartan (COZAAR) 50 MG tablet Take 1 tablet (50 mg total) by mouth daily.   metoprolol succinate (TOPROL-XL) 50 MG 24 hr tablet Take 1 tablet (50 mg total) by mouth daily. Take with or immediately following a meal.   nystatin (MYCOSTATIN/NYSTOP) powder Apply 1 application  topically daily as needed (for fungal infections).   OVER THE COUNTER MEDICATION Take 1-2 drops by mouth daily as needed  (Thyroid Function). Potassium Iodide 4 %; Iodine 2 %; Distilled water   oxybutynin (DITROPAN-XL) 10 MG 24 hr tablet Take 1 tablet (10 mg total) by mouth at bedtime.   OXYGEN Inhale 4 L/min into the lungs continuous.   potassium chloride (KLOR-CON) 10 MEQ tablet Take 2 tablets (20 mEq total) by mouth daily.   terconazole (TERAZOL 3) 0.8 % vaginal cream Place 1 applicator vaginally at bedtime.   valACYclovir (VALTREX) 500 MG tablet Take 1 tablet (500 mg total) by mouth daily.   Vitamin D, Ergocalciferol, (DRISDOL) 1.25 MG (50000 UNIT) CAPS capsule Take 1 capsule (50,000 Units total) by mouth 2 (two) times a week. (Patient taking differently: Take 50,000 Units by mouth every 7 (seven) days.)     Allergies:   Actos [pioglitazone], Augmentin [amoxicillin-pot clavulanate], Citrus, Invokana [canagliflozin], Lopid [gemfibrozil], Metoprolol, Naproxen sodium, Penicillins, Pineapple, Statins, Tape, Amlodipine, Clavulanic acid, Hctz [hydrochlorothiazide], Lisinopril, and Morphine and related   Past Medical History:  Diagnosis Date   Allergy    Anginal pain ([place])    Anxiety    Arthritis    Asthma    Bladder disorder, other    Borderline personality disorder ([place])    Carotid artery disease ([place]) 03/05/2017   Depression    Diabetes mellitus without complication (HCC)    GERD (gastroesophageal reflux disease)    Headache    Hyperlipidemia    Hypertension    Kidney stones    Mucous retention cyst of maxillary sinus 08/31/2021   Noted on head CT   Neuromuscular disorder (HCC)    Neuropathy   PTSD (post-traumatic stress disorder)    Shortness of breath    Sleep apnea    Stroke ([place]) 2018   Thyroid disease    nodule    Past Surgical History:  Procedure Laterality Date   CARPAL TUNNEL RELEASE     left 2015   CERVICAL FUSION Bilateral 2003   C 5-6 with titanium screws   CHOLECYSTECTOMY     COLONOSCOPY WITH PROPOFOL N/A 06/24/2014   Procedure: COLONOSCOPY WITH PROPOFOL;  Surgeon: Rogene Houston, MD;  Location: [facility];  Service: Endoscopy;  Laterality: N/A;  cecum time in 0810    time out   0821    total time 11 minutes   DILITATION & CURRETTAGE/HYSTROSCOPY WITH HYDROTHERMAL ABLATION N/A 04/19/2021   Procedure: DILATATION & CURETTAGE/HYSTEROSCOPY WITH HYDROTHERMAL ABLATION;  Surgeon: Janyth Pupa, DO;  Location: [facility];  Service: Gynecology;  Laterality: N/A;   INTRAUTERINE DEVICE (IUD) INSERTION N/A 04/19/2021   Procedure: INTRAUTERINE DEVICE (IUD) INSERTION;  Surgeon: Nelda Marseille,  Anderson Malta, DO;  Location: [facility];  Service: Gynecology;  Laterality: N/A;   POLYPECTOMY N/A 06/24/2014   Procedure: RECTAL POLYPECTOMY;  Surgeon: Rogene Houston, MD;  Location: [facility];  Service: Endoscopy;  Laterality: N/A;   RIGHT/LEFT HEART CATH AND CORONARY ANGIOGRAPHY N/A 06/19/2022   Procedure: RIGHT/LEFT HEART CATH AND CORONARY ANGIOGRAPHY;  Surgeon: Sherren Mocha, MD;  Location: [facility];  Service: Cardiovascular;  Laterality: N/A;   [place] EXTRACTION Bilateral 2001 and 1990s   top and bottom      Social History:  The patient  reports that she has never smoked. She has never used smokeless tobacco. She reports that she does not drink alcohol and does not use drugs.   Family History:  The patient's family history includes Alcohol abuse in her paternal grandfather and paternal grandmother; Arthritis in her father and mother; Asthma in her mother; Birth defects in her sister; Breast cancer in her sister and sister; COPD in her mother and sister; Colon cancer in her sister and sister; Depression in her father and mother; Diabetes in her mother and paternal grandmother; Drug abuse in her father; Early death in her father; Heart disease in her mother; Hyperlipidemia in her mother and sister; Hypertension in her mother; Kidney disease in her father; Learning disabilities in her sister; Lung cancer in her father; Mental illness in her father and  mother; Skin cancer in her mother.    ROS:  Please see the history of present illness. All other systems are reviewed and  Negative to the above problem except as noted.    PHYSICAL EXAM: VS:  BP (!) 145/72   Pulse 86   Ht 5\' 6"  (1.676 m)   Wt (!) 314 lb (142.4 kg)   SpO2 99%   BMI 50.68 kg/m   GEN:  Morbidly obese 52 yo in no acute distress  HEENT: normal  Neck: JVP normal  No carotid bruits Cardiac: RRR; no murmur No LE edema  Respiratory:  clear to auscultation  GI: soft, nontender    No hepatomegaly   EKG:  EKG is  not ordered today.  CARDIAC STUDIES  Echocardiogram 05/22/2022 ([place]): Summary: 1. The left ventricle is normal in size with mildly increased wall  thickness.    2. The left ventricular systolic function is normal, LVEF is visually  estimated at 60-65%.    3. There is grade II diastolic dysfunction (elevated filling pressure).    4. The left atrium is mildly dilated in size.    5. The right ventricle is mildly dilated in size, with normal systolic  function.    6. IVC size and inspiratory change suggest mildly elevated right atrial  pressure. (5-10 mmHg).  _______________   Right/ Left Cardiac Catheterization 06/19/2022:   Ost LAD to Prox LAD lesion is 30% stenosed.   Mid RCA lesion is 40% stenosed.   RPDA lesion is 90% stenosed.   1.  Widely patent left main with no stenosis 2.  Mild nonobstructive proximal LAD stenosis without any high-grade coronary stenoses throughout the LAD distribution 3.  Patent circumflex with no stenosis 4.  Nonobstructive mid RCA stenosis and severe right PDA stenosis, PDA disease distribution is in a distal portion of the vessel not amenable to PCI 5.  Mild pulmonary hypertension with PA pressure of 55/16 mean 33 mmHg, LVEDP 13 mmHg, mean wedge pressure 19 mmHg   Recommendations: Medical therapy  Dominance: Right     Lipid Panel    Component Value Date/Time   CHOL 176 09/13/2022 1045   CHOL 188  03/21/2022 1540   TRIG 123 09/13/2022 1045   HDL 36 (L) 09/13/2022 1045   HDL 39 (L) 03/21/2022 1540   CHOLHDL 4.9 09/13/2022 1045   VLDL 25 09/13/2022 1045   LDLCALC 115 (H) 09/13/2022 1045   LDLCALC 116 (H) 03/21/2022 1540      Wt Readings from Last 3 Encounters:  11/16/22 (!) 314 lb (142.4 kg)  10/23/22 (!) 318 lb (144.2 kg)  10/19/22 (!) 314 lb 6.4 oz (142.6 kg)      ASSESSMENT AND PLAN:  1  CAD  Pt denies any symptoms that sugg angina  2 Hx HFpEF    Voume status overall appears OK  3  HTN  BP is elevated    Will increase losartan to 50 mg bid    Follow up BP in June   4  Hx SVT  No symptoms to sugg recurrence     5  HL  LDL 115 in Jan   Will look into Incliseran     6  Hx CVA   On plavix        Current medicines are reviewed at length with the patient today.  The patient does not have concerns regarding medicines.  Signed, Dorris Carnes, MD  11/16/2022 9:38 AM    [address], [place], [place]  [address] Phone: [phone]; Fax: [phone]

## 2022-11-16 NOTE — Patient Instructions (Signed)
Medication Instructions:  INCREASE Losartan 50 mg to TWICE a day  Labwork: None today  Testing/Procedures: None today  Follow-Up: June  Any Other Special Instructions Will Be Listed Below (If Applicable).  If you need a refill on your cardiac medications before your next appointment, please call your pharmacy.

## 2022-11-21 ENCOUNTER — Telehealth: Payer: Self-pay | Admitting: *Deleted

## 2022-11-21 ENCOUNTER — Telehealth: Payer: Self-pay

## 2022-11-21 ENCOUNTER — Other Ambulatory Visit (HOSPITAL_COMMUNITY): Payer: Self-pay

## 2022-11-21 NOTE — Telephone Encounter (Signed)
Patient Advocate Encounter   Received notification from pt msgs that prior authorization is required for Dexcom G7 sensor  Submitted: 11/21/22 by phone PA# [id number]  Status is pending

## 2022-11-21 NOTE — Telephone Encounter (Signed)
Patient left a message on 11/20/22, she shares that she is still without her dexcom G 7 and she says that she has  been told that it needing a PA. She also states that the only insurance that she has is the [place] , she was removed from  her husband insurance, refer to the telephone encounter from March.  Patient was called and advised that we would check with the PA team for any updates on the PA.Marland Kitchen

## 2022-11-22 ENCOUNTER — Ambulatory Visit: Payer: Medicaid Other | Admitting: Family Medicine

## 2022-11-22 ENCOUNTER — Telehealth: Payer: Self-pay

## 2022-11-22 ENCOUNTER — Other Ambulatory Visit (HOSPITAL_COMMUNITY): Payer: Self-pay

## 2022-11-22 NOTE — Telephone Encounter (Signed)
-----   Message from Fay Records, MD sent at 11/21/2022 10:17 PM EDT ----- I think pt had problems getting approval for Praluent    ? INcliseran

## 2022-11-22 NOTE — Telephone Encounter (Signed)
LM for the pt to see if okay to place referral to PharmD for Lipid med management/ approvals.

## 2022-11-22 NOTE — Telephone Encounter (Signed)
Pharmacy Patient Advocate Encounter  Prior Authorization for Dexcom G7 sensor  has been approved by Medicaid (ins).    PA # [id number]  Effective dates: 11/21/22 through 05/20/23

## 2022-11-23 ENCOUNTER — Other Ambulatory Visit: Payer: Self-pay | Admitting: *Deleted

## 2022-11-23 DIAGNOSIS — E119 Type 2 diabetes mellitus without complications: Secondary | ICD-10-CM

## 2022-11-23 MED ORDER — DEXCOM G7 SENSOR MISC: 3 refills | Status: DC

## 2022-11-23 NOTE — Telephone Encounter (Signed)
Prescription was sent

## 2022-11-23 NOTE — Telephone Encounter (Signed)
Patient was called and made aware. 

## 2022-11-24 DIAGNOSIS — R0902 Hypoxemia: Secondary | ICD-10-CM | POA: Diagnosis not present

## 2022-11-24 DIAGNOSIS — I509 Heart failure, unspecified: Secondary | ICD-10-CM | POA: Diagnosis not present

## 2022-11-24 DIAGNOSIS — J189 Pneumonia, unspecified organism: Secondary | ICD-10-CM | POA: Diagnosis not present

## 2022-12-11 ENCOUNTER — Other Ambulatory Visit: Payer: Self-pay | Admitting: Family Medicine

## 2022-12-11 DIAGNOSIS — I679 Cerebrovascular disease, unspecified: Secondary | ICD-10-CM

## 2022-12-14 ENCOUNTER — Ambulatory Visit (INDEPENDENT_AMBULATORY_CARE_PROVIDER_SITE_OTHER): Payer: Medicaid Other | Admitting: Family Medicine

## 2022-12-14 VITALS — BP 113/65 | HR 72 | Temp 98.5°F | Ht 66.0 in | Wt 313.0 lb

## 2022-12-14 DIAGNOSIS — Z6841 Body Mass Index (BMI) 40.0 and over, adult: Secondary | ICD-10-CM

## 2022-12-14 DIAGNOSIS — I10 Essential (primary) hypertension: Secondary | ICD-10-CM

## 2022-12-14 DIAGNOSIS — E1142 Type 2 diabetes mellitus with diabetic polyneuropathy: Secondary | ICD-10-CM | POA: Diagnosis not present

## 2022-12-14 DIAGNOSIS — E1169 Type 2 diabetes mellitus with other specified complication: Secondary | ICD-10-CM

## 2022-12-14 DIAGNOSIS — D649 Anemia, unspecified: Secondary | ICD-10-CM

## 2022-12-14 DIAGNOSIS — E559 Vitamin D deficiency, unspecified: Secondary | ICD-10-CM

## 2022-12-14 DIAGNOSIS — B351 Tinea unguium: Secondary | ICD-10-CM

## 2022-12-14 DIAGNOSIS — E785 Hyperlipidemia, unspecified: Secondary | ICD-10-CM

## 2022-12-14 DIAGNOSIS — F331 Major depressive disorder, recurrent, moderate: Secondary | ICD-10-CM

## 2022-12-14 DIAGNOSIS — Z794 Long term (current) use of insulin: Secondary | ICD-10-CM

## 2022-12-14 NOTE — Progress Notes (Signed)
Acute Office Visit  Subjective:  Patient ID: Kathryn Jimenez, female    DOB: [birth year], 52 y.o.   MRN: [id number]  Chief Complaint  Patient presents with   Medical Management of Chronic Issues    4 week follow up   HPI Patient is in today for follow up on chronic conditions  HTN  She has taken her BP at home daily. She brought her log with her today. Numbers are significantly different from what is measured today. She is averaging 140s-180/80-100s at home. Using [facility] brand automatic monitor. Reports that the cuff is not able to fit well over her upper arm. Has follow up in June with Cardiology and they plan for her to have a sleep study. Recently increased her losartan. Taking 100mg  daily in the morning. She was instructed to take 50 mg BID, however reports that she was not able to tolerate it in this way. Is not taking prazosin anymore due to orthostatic hypotension and is doing a lot better.   HLD  No compliance issues with zetia. Follows with Cardiology, states that they discussed Repatha. Reports compliance issues with diet. States that she has been trying to maintain a healthier diet, but that her husband deters her.   Diabetes  Follows with Dr. Lurlean Leyden in [place] with Endocrinology. Denies any low BG. States that she is still working to gain control of her BG. Eye exam is UTD.   Obesity  She states that she has been workng out in the yard and in her house. She has been picking up sticks and painting her house. Attempting to maintain a healthier diet.   Vitamin D  Previously took supplementation, but she is not at the time. Denies paresthesia, endorses fatigue, shortness of breath.   Vitamin B12 Deficiency  Previously deficient on labs. She does not take supplementation at that time.   Wound  Followed up with podiatry and was treated for fungal infection.   Depression and Anxiety Continues to follow at [facility]. Denies SI. Reports that she is controlled.   ROS As per  HPI Objective:  BP 113/65   Pulse 72   Temp 98.5 F (36.9 C)   Ht 5\' 6"  (1.676 m)   Wt (!) 313 lb (142 kg)   SpO2 95%   BMI 50.52 kg/m    Physical Exam Constitutional:      General: She is not in acute distress.    Appearance: Normal appearance. She is obese. She is not ill-appearing, toxic-appearing or diaphoretic.  Cardiovascular:     Rate and Rhythm: Normal rate.     Pulses: Normal pulses.     Heart sounds: Normal heart sounds. No murmur heard.    No gallop.  Pulmonary:     Effort: Pulmonary effort is normal. No respiratory distress.     Breath sounds: Normal breath sounds. No stridor. No wheezing, rhonchi or rales.  Feet:     Right foot:     Skin integrity: Dry skin present.     Toenail Condition: Right toenails are abnormally thick and long. Fungal disease present. Skin:    General: Skin is warm.     Capillary Refill: Capillary refill takes less than 2 seconds.  Neurological:     General: No focal deficit present.     Mental Status: She is alert and oriented to person, place, and time. Mental status is at baseline.     Motor: No weakness.  Psychiatric:        Mood and Affect: Mood  normal.        Behavior: Behavior normal.        Thought Content: Thought content normal.        Judgment: Judgment normal.       12/14/2022    3:34 PM 10/23/2022   10:54 AM 07/20/2022   11:28 AM  Depression screen PHQ 2/9  Decreased Interest 1 1 1   Down, Depressed, Hopeless 1 1 1   PHQ - 2 Score 2 2 2   Altered sleeping 1 1 1   Tired, decreased energy 1 1 1   Change in appetite 1 1 1   Feeling bad or failure about yourself  1 1 1   Trouble concentrating 1 1 1   Moving slowly or fidgety/restless 1 1 1   Suicidal thoughts 0 0 0  PHQ-9 Score 8 8 8   Difficult doing work/chores Somewhat difficult Somewhat difficult       12/14/2022    3:34 PM 10/23/2022   10:55 AM 07/20/2022   11:28 AM 03/21/2022    2:54 PM  GAD 7 : Generalized Anxiety Score  Nervous, Anxious, on Edge 1 1 1 1    Control/stop worrying 1 1 1 1   Worry too much - different things 1 1 1 1   Trouble relaxing 1 2 1 1   Restless 1 1 1 1   Easily annoyed or irritable 1 1 2 1   Afraid - awful might happen 1 1 1 1   Total GAD 7 Score 7 8 8 7   Anxiety Difficulty Somewhat difficult Somewhat difficult  Somewhat difficult   Assessment & Plan:  1. Vitamin D deficiency Patient was previously deficient and supplemented. She has not been supplemented recently. Labs as below. Will communicate results to patient once available.  - VITAMIN D 25 Hydroxy (Vit-D Deficiency, Fractures)  2. Anemia, unspecified type Previously anemic. Will investigate cause with labs as below. Will communicate results to patient once available.  - Anemia Profile B  3. Primary hypertension Well controlled in office today. Discussed with patient purchasing another monitor for home. She endorsed financial concerns. Discussed with patient the [employer] brand for monitoring. Emphasized for patient to follow up closely with Cardiology and inform them of how she is taking her medications.  - CMP14+EGFR  4. Type 2 diabetes mellitus with diabetic polyneuropathy, with long-term current use of insulin (HCC) Managed by endocrinology. Encouraged patient with the work that she is doing to manage her health.   5. Moderate episode of recurrent major depressive disorder (HCC) Managed by [facility]. Well controlled and stable. Denies SI. Will continue to monitor.   6. Hyperlipidemia associated with type 2 diabetes mellitus (HCC) Managed by Cardiology. Not at goal. Encouraged her to follow up with Cards.   7. Morbid obesity with BMI of 50.0-59.9, adult [facility]) Praised patient for the exercise and activity that she is getting. Encouraged her to continue working at adding more in.   8. Onychomycosis Managed by [employer]. She has not started oral therapy for it, but was encouraged to follow up with them for treatment.   The above assessment and management plan was  discussed with the patient. The patient verbalized understanding of and has agreed to the management plan using shared-decision making. Patient is aware to call the clinic if they develop any new symptoms or if symptoms fail to improve or worsen. Patient is aware when to return to the clinic for a follow-up visit. Patient educated on when it is appropriate to go to the emergency department.   Neale Burly, DNP-FNP [facility] [phone]  [address] [place], [place] [address] [phone]

## 2022-12-14 NOTE — Patient Instructions (Signed)
[employer] - Bronze  

## 2022-12-15 LAB — ANEMIA PROFILE B
Basophils Absolute: 0.1 10*3/uL (ref 0.0–0.2)
Basos: 1 %
EOS (ABSOLUTE): 0.1 10*3/uL (ref 0.0–0.4)
Eos: 1 %
Ferritin: 152 ng/mL — ABNORMAL HIGH (ref 15–150)
Folate: 2.6 ng/mL — ABNORMAL LOW (ref >3.0)
Hematocrit: 43.4 % (ref 34.0–46.6)
Hemoglobin: 14.4 g/dL (ref 11.1–15.9)
Immature Grans (Abs): 0.1 10*3/uL (ref 0.0–0.1)
Immature Granulocytes: 1 %
Iron Saturation: 24 % (ref 15–55)
Iron: 78 ug/dL (ref 27–159)
Lymphocytes Absolute: 5 10*3/uL — ABNORMAL HIGH (ref 0.7–3.1)
Lymphs: 35 %
MCH: 28.5 pg (ref 26.6–33.0)
MCHC: 33.2 g/dL (ref 31.5–35.7)
MCV: 86 fL (ref 79–97)
Monocytes Absolute: 1 10*3/uL — ABNORMAL HIGH (ref 0.1–0.9)
Monocytes: 7 %
Neutrophils Absolute: 8.1 10*3/uL — ABNORMAL HIGH (ref 1.4–7.0)
Neutrophils: 55 %
Platelets: 470 10*3/uL — ABNORMAL HIGH (ref 150–450)
RBC: 5.05 x10E6/uL (ref 3.77–5.28)
RDW: 14 % (ref 11.7–15.4)
Retic Ct Pct: 2.1 % (ref 0.6–2.6)
Total Iron Binding Capacity: 323 ug/dL (ref 250–450)
UIBC: 245 ug/dL (ref 131–425)
Vitamin B-12: 237 pg/mL (ref 232–1245)
WBC: 14.3 10*3/uL — ABNORMAL HIGH (ref 3.4–10.8)

## 2022-12-15 LAB — CMP14+EGFR
ALT: 15 IU/L (ref 0–32)
AST: 15 IU/L (ref 0–40)
Albumin/Globulin Ratio: 1.4 (ref 1.2–2.2)
Albumin: 4.1 g/dL (ref 3.9–4.9)
Alkaline Phosphatase: 119 IU/L (ref 44–121)
BUN/Creatinine Ratio: 14 (ref 9–23)
BUN: 11 mg/dL (ref 6–24)
Bilirubin Total: 0.3 mg/dL (ref 0.0–1.2)
CO2: 21 mmol/L (ref 20–29)
Calcium: 9.5 mg/dL (ref 8.7–10.2)
Chloride: 97 mmol/L (ref 96–106)
Creatinine, Ser: 0.78 mg/dL (ref 0.57–1.00)
Globulin, Total: 2.9 g/dL (ref 1.5–4.5)
Glucose: 139 mg/dL — ABNORMAL HIGH (ref 70–99)
Potassium: 4.2 mmol/L (ref 3.5–5.2)
Sodium: 136 mmol/L (ref 134–144)
Total Protein: 7 g/dL (ref 6.0–8.5)
eGFR: 93 mL/min/{1.73_m2} (ref >59)

## 2022-12-15 LAB — VITAMIN D 25 HYDROXY (VIT D DEFICIENCY, FRACTURES): Vit D, 25-Hydroxy: 15.1 ng/mL — ABNORMAL LOW (ref 30.0–100.0)

## 2022-12-19 MED ORDER — VITAMIN D (ERGOCALCIFEROL) 1.25 MG (50000 UNIT) PO CAPS
50000.0000 [IU] | ORAL_CAPSULE | ORAL | 0 refills | Status: AC
Start: 2022-12-19 — End: 2023-03-07

## 2022-12-19 NOTE — Addendum Note (Signed)
Addended by: Neale Burly on: 12/19/2022 01:02 PM   Modules accepted: Orders

## 2022-12-24 ENCOUNTER — Other Ambulatory Visit: Payer: Self-pay | Admitting: Family Medicine

## 2022-12-24 DIAGNOSIS — I152 Hypertension secondary to endocrine disorders: Secondary | ICD-10-CM

## 2022-12-24 DIAGNOSIS — N39498 Other specified urinary incontinence: Secondary | ICD-10-CM

## 2022-12-24 DIAGNOSIS — I10 Essential (primary) hypertension: Secondary | ICD-10-CM

## 2022-12-24 DIAGNOSIS — J4520 Mild intermittent asthma, uncomplicated: Secondary | ICD-10-CM

## 2022-12-24 DIAGNOSIS — I679 Cerebrovascular disease, unspecified: Secondary | ICD-10-CM

## 2022-12-24 DIAGNOSIS — R601 Generalized edema: Secondary | ICD-10-CM

## 2022-12-24 NOTE — Telephone Encounter (Signed)
  Prescription Request  12/24/2022  Is this a "Controlled Substance" medicine? furosemide (LASIX) 40 MG tablet  potassium chloride (KLOR-CON) 10 MEQ tablet  metoprolol succinate (TOPROL-XL) 50 MG 24 hr tablet  clopidogrel (PLAVIX) 75 MG tablet  oxybutynin (DITROPAN-XL) 10 MG 24 hr tablet  albuterol (VENTOLIN HFA) 108 (90 Base) MCG/ACT inhaler   Have you seen your PCP in the last 2 weeks? 12/14/2022 upcoming apt 03/26/2023  If YES, route message to pool  -  If NO, patient needs to be scheduled for appointment.  What is the name of the medication or equipment? albuterol (VENTOLIN HFA) 108 (90 Base) MCG/ACT inhaler   Have you contacted your pharmacy to request a refill? no   Which pharmacy would you like this sent to? [facility]   Patient notified that their request is being sent to the clinical staff for review and that they should receive a response within 2 business days.

## 2022-12-25 MED ORDER — OXYBUTYNIN CHLORIDE ER 10 MG PO TB24
10.0000 mg | ORAL_TABLET | Freq: Every day | ORAL | 1 refills | Status: DC
Start: 2022-12-25 — End: 2023-04-04

## 2022-12-25 MED ORDER — FUROSEMIDE 40 MG PO TABS
40.0000 mg | ORAL_TABLET | Freq: Every morning | ORAL | 0 refills | Status: DC
Start: 2022-12-25 — End: 2023-03-19

## 2022-12-25 MED ORDER — ALBUTEROL SULFATE HFA 108 (90 BASE) MCG/ACT IN AERS
INHALATION_SPRAY | RESPIRATORY_TRACT | 0 refills | Status: DC
Start: 2022-12-25 — End: 2023-03-25

## 2022-12-25 MED ORDER — METOPROLOL SUCCINATE ER 50 MG PO TB24
50.0000 mg | ORAL_TABLET | Freq: Every day | ORAL | 0 refills | Status: DC
Start: 2022-12-25 — End: 2023-05-16

## 2022-12-25 MED ORDER — CLOPIDOGREL BISULFATE 75 MG PO TABS
75.0000 mg | ORAL_TABLET | Freq: Every day | ORAL | 0 refills | Status: DC
Start: 2022-12-25 — End: 2023-02-22

## 2022-12-25 NOTE — Telephone Encounter (Signed)
Patient aware and states that she was always getting these medication from PCP.  Wanting to know why she needs to get them from cardiology now?  States she was on the medication before she ever went to see cardiology.

## 2023-01-08 ENCOUNTER — Other Ambulatory Visit (HOSPITAL_COMMUNITY): Payer: Self-pay

## 2023-01-11 ENCOUNTER — Other Ambulatory Visit: Payer: Self-pay | Admitting: Nurse Practitioner

## 2023-01-15 DIAGNOSIS — R000 Tachycardia, unspecified: Secondary | ICD-10-CM | POA: Diagnosis not present

## 2023-01-15 DIAGNOSIS — I10 Essential (primary) hypertension: Secondary | ICD-10-CM | POA: Diagnosis not present

## 2023-01-15 DIAGNOSIS — R059 Cough, unspecified: Secondary | ICD-10-CM | POA: Diagnosis not present

## 2023-01-15 DIAGNOSIS — R0902 Hypoxemia: Secondary | ICD-10-CM | POA: Diagnosis not present

## 2023-01-18 DIAGNOSIS — R000 Tachycardia, unspecified: Secondary | ICD-10-CM | POA: Diagnosis not present

## 2023-01-18 DIAGNOSIS — R0689 Other abnormalities of breathing: Secondary | ICD-10-CM | POA: Diagnosis not present

## 2023-01-18 DIAGNOSIS — R0902 Hypoxemia: Secondary | ICD-10-CM | POA: Diagnosis not present

## 2023-01-22 ENCOUNTER — Ambulatory Visit: Payer: BC Managed Care – PPO | Admitting: Nurse Practitioner

## 2023-01-22 ENCOUNTER — Inpatient Hospital Stay: Admit: 2023-01-22 | Payer: Medicaid Other | Admitting: Internal Medicine

## 2023-01-22 ENCOUNTER — Encounter (HOSPITAL_COMMUNITY): Payer: Self-pay

## 2023-01-24 ENCOUNTER — Inpatient Hospital Stay: Payer: Medicaid Other | Admitting: Family Medicine

## 2023-01-28 ENCOUNTER — Ambulatory Visit: Payer: BC Managed Care – PPO | Admitting: Neurology

## 2023-01-29 ENCOUNTER — Telehealth: Payer: Self-pay

## 2023-01-29 NOTE — Transitions of Care (Post Inpatient/ED Visit) (Signed)
01/29/2023  Name: Kathryn Jimenez MRN: [id number] DOB: [birth year]  Today's TOC FU Call Status: Today's TOC FU Call Status:: Successful TOC FU Call Competed TOC FU Call Complete Date: 01/29/23  Transition Care Management Follow-up Telephone Call Date of Discharge: 01/28/23 Discharge Facility: Other [facility]) Name of Other ([facility]) Discharge Facility: [facility] Type of Discharge: Inpatient Admission Primary Inpatient Discharge Diagnosis:: Acute on Chronic Respiratory Failure How have you been since you were released from the hospital?: Better Any questions or concerns?: No  Items Reviewed: Did you receive and understand the discharge instructions provided?: Yes Medications obtained,verified, and reconciled?: Partial Review Completed Reason for Partial Mediation Review: Patient tired, discussed new medications from [facility] Any new allergies since your discharge?: No Dietary orders reviewed?: Yes Type of Diet Ordered:: Consistent Carbohydrate, 2gram sodium restriction Do you have support at home?: Yes People in Home: spouse Name of Support/Comfort Primary Source: Homero Fellers  Medications Reviewed Today: Medications Reviewed Today     Reviewed by Jodelle Gross, RN (Case Manager) on 01/29/23 at 1344  Med List Status: <None>   Medication Order Taking? Sig Documenting Provider Last Dose Status Informant  ACCU-CHEK GUIDE test strip [id number]   [provider]  Active   acetaminophen (TYLENOL) 500 MG tablet [id number]  Take 500 mg by mouth every 6 (six) hours as needed for mild pain or moderate pain. [provider]  Active Self  albuterol (VENTOLIN HFA) 108 (90 Base) MCG/ACT inhaler [id number]  2 PUFFS INTO THE LUNGS EVERY 6 HOURS AS NEEDED FOR WHEEZING OR SHORTNESS OF BREATH. Strength: 108 (90 Base) MCG/ACT Arrie Senate, FNP  Active   budesonide-formoterol [facility]) 160-4.5 MCG/ACT inhaler [id number] Yes Inhale 2 puffs into the lungs in the morning and at  bedtime. [provider] Taking Active   buPROPion (WELLBUTRIN XL) 150 MG 24 hr tablet [id number] Yes Take 150 mg by mouth daily. [provider] Taking Active Self           Med Note Sharlynn Oliphant Jun 18, 2022  9:26 PM) This is the last "home" dose. Patient arrived here from [facility] in [place] (I saw nothing in her shadow chart when checking for a last-given time there).  clobetasol cream (TEMOVATE) 0.05 % [id number]  Apply 1 application topically 2 (two) times daily as needed (scalp psoriasis). Deliah Boston F, FNP  Active Self  clopidogrel (PLAVIX) 75 MG tablet [id number]  Take 1 tablet (75 mg total) by mouth daily. Arrie Senate, FNP  Active   Continuous Blood Gluc Sensor (DEXCOM G7 SENSOR) [place] [id number]  Monitor Blood Sugars four times daily , before meals and at bedtime. Dani Gobble, NP  Active   cycloSPORINE (RESTASIS) 0.05 % ophthalmic emulsion [id number]  Place 1 drop into both eyes 2 (two) times daily. [provider]  Active Self  diphenhydrAMINE (BENADRYL) 25 mg capsule [id number]  Take 25 mg by mouth every 6 (six) hours as needed for allergies. [provider]  Active Self  ezetimibe (ZETIA) 10 MG tablet [id number]  Take 1 tablet (10 mg total) by mouth daily. Arrie Senate, FNP  Active   fluconazole (DIFLUCAN) 150 MG tablet [id number]  Take one tablet once then repeat in 3 days if needed Myna Hidalgo, DO  Active   FLUoxetine (PROZAC) 40 MG capsule [id number]  Take 40 mg by mouth daily. [provider]  Active Self  furosemide (LASIX) 40 MG tablet [id number] No Take 1 tablet (40 mg total) by mouth in  the morning.  Patient not taking: Reported on 01/29/2023   Arrie Senate, FNP Not Taking Active            Med Note Electa Sniff, [facility]   Tue Jan 29, 2023  1:38 PM) Discontinued by Smitty Cords   guaiFENesin (MUCINEX) 600 MG 12 hr tablet [id number]  Take 1 tablet (600 mg total) by mouth 2 (two) times daily.   Patient taking differently: Take 600 mg by mouth 2 (two) times daily as needed for to loosen phlegm or cough.   Daryll Drown, NP  Active Self  Incontinence Supply Disposable (PREVAIL IB FULL MAT BRIEF 2XL) MISC [id number]  1 each by Does not apply route every 2 (two) hours as needed. Gwenlyn Fudge, FNP  Active Self  insulin aspart (NOVOLOG FLEXPEN) 100 UNIT/ML FlexPen [id number]  Inject 14-20 Units into the skin 3 (three) times daily with meals. Dani Gobble, NP  Active   insulin glargine (LANTUS SOLOSTAR) 100 UNIT/ML Solostar Pen [id number]  Inject 35 Units into the skin at bedtime. Dani Gobble, NP  Active   Insulin Pen Needle (CAREFINE PEN NEEDLES) 31G X 6 MM MISC [id number]  Use to inject insulin 4 times daily Reardon, Freddi Starr, NP  Active   Insulin Pen Needle (GLOBAL EASE INJECT PEN NEEDLES) 31G X 5 MM MISC [id number]  USE TO INJECT INSULIN 4 TIMES A DAY. Dani Gobble, NP  Active   levonorgestrel (LILETTA, 52 MG,) 20.1 MCG/DAY IUD [id number]  1 each by Intrauterine route once. Inserted 04/19/21 [provider]  Active Self           Med Note Antony Madura, Ladona Mow Jun 18, 2022  9:10 PM) "Good for 7 years"  LORazepam (ATIVAN) 0.5 MG tablet [id number]  Take 0.25-0.5 mg by mouth daily as needed for anxiety. [provider]  Active Self           Med Note Antony Madura, Ladona Mow Jun 18, 2022  8:46 PM)    losartan (COZAAR) 50 MG tablet [id number]  Take 1 tablet (50 mg total) by mouth in the morning and at bedtime. Pricilla Riffle, MD  Active   metoprolol succinate (TOPROL-XL) 50 MG 24 hr tablet [id number]  Take 1 tablet (50 mg total) by mouth daily. Take with or immediately following a meal. Milian, Aleen Campi, FNP  Active   nystatin (MYCOSTATIN/NYSTOP) powder [id number]  Apply 1 application  topically daily as needed (for fungal infections). [provider]  Active Self  OVER THE COUNTER MEDICATION [id number]  Take 1-2 drops by mouth daily as needed (Thyroid  Function). Potassium Iodide 4 %; Iodine 2 %; Distilled water [provider]  Active Self  oxybutynin (DITROPAN-XL) 10 MG 24 hr tablet [id number]  Take 1 tablet (10 mg total) by mouth at bedtime. Arrie Senate, FNP  Active   OXYGEN [id number]  Inhale 4 L/min into the lungs continuous. [provider]  Active Self  potassium chloride (KLOR-CON) 10 MEQ tablet [id number]  Take 2 tablets (20 mEq total) by mouth daily. Gwenlyn Fudge, FNP  Active Self           Med Note Sharlynn Oliphant Jun 18, 2022  9:27 PM) This is the last "home" dose. Patient arrived here from [facility] in [place] (I saw nothing in her shadow chart when checking for a last-given time there).  terconazole (TERAZOL 3) 0.8 % vaginal cream [id number]  Place  1 applicator vaginally at bedtime. Daryll Drown, NP  Active   torsemide (DEMADEX) 20 MG tablet [id number] Yes Take 20 mg by mouth once. Can take extra tablet with weight gain greater than 3 lb in 24 hours [provider] Taking Active   umeclidinium bromide (INCRUSE ELLIPTA) 62.5 MCG/ACT AEPB [id number] Yes Inhale 1 puff into the lungs daily. [provider]  Active   valACYclovir (VALTREX) 500 MG tablet [id number]  Take 1 tablet (500 mg total) by mouth daily. Myna Hidalgo, DO  Active   Vitamin D, Ergocalciferol, (DRISDOL) 1.25 MG (50000 UNIT) CAPS capsule [id number]  Take 1 capsule (50,000 Units total) by mouth every 7 (seven) days for 12 doses. Arrie Senate, FNP  Active             Home Care and Equipment/Supplies: Were Home Health Services Ordered?: No Any new equipment or medical supplies ordered?: No  Functional Questionnaire: Do you need assistance with bathing/showering or dressing?: No Do you need assistance with meal preparation?: No Do you need assistance with eating?: No Do you have difficulty maintaining continence: No Do you need assistance with getting out of bed/getting out of a  chair/moving?: No Do you have difficulty managing or taking your medications?: No  Follow up appointments reviewed: PCP Follow-up appointment confirmed?: No MD Provider Line [phone] Given: No (Patient prefers to call) Specialist Hospital Follow-up appointment confirmed?: NA Do you need transportation to your follow-up appointment?: No Do you understand care options if your condition(s) worsen?: Yes-patient verbalized understanding  Jodelle Gross, RN, BSN, CCM Care Management Coordinator [facility]

## 2023-02-04 ENCOUNTER — Ambulatory Visit: Payer: Medicaid Other | Admitting: Internal Medicine

## 2023-02-08 ENCOUNTER — Ambulatory Visit: Payer: BC Managed Care – PPO | Admitting: Nurse Practitioner

## 2023-02-11 ENCOUNTER — Telehealth (INDEPENDENT_AMBULATORY_CARE_PROVIDER_SITE_OTHER): Payer: Medicaid Other | Admitting: Nurse Practitioner

## 2023-02-11 DIAGNOSIS — J9621 Acute and chronic respiratory failure with hypoxia: Secondary | ICD-10-CM

## 2023-02-11 DIAGNOSIS — Z7689 Persons encountering health services in other specified circumstances: Secondary | ICD-10-CM

## 2023-02-11 DIAGNOSIS — J9622 Acute and chronic respiratory failure with hypercapnia: Secondary | ICD-10-CM | POA: Diagnosis not present

## 2023-02-11 NOTE — Progress Notes (Signed)
Virtual Visit Consent   Kathryn Jimenez, you are scheduled for a virtual visit with Mary-Margaret Daphine Deutscher, FNP, a [facility] provider, today.     Just as with appointments in the office, your consent must be obtained to participate.  Your consent will be active for this visit and any virtual visit you may have with one of our providers in the next 365 days.     If you have a MyChart account, a copy of this consent can be sent to you electronically.  All virtual visits are billed to your insurance company just like a traditional visit in the office.    As this is a virtual visit, video technology does not allow for your provider to perform a traditional examination.  This may limit your provider's ability to fully assess your condition.  If your provider identifies any concerns that need to be evaluated in person or the need to arrange testing (such as labs, EKG, etc.), we will make arrangements to do so.     Although advances in technology are sophisticated, we cannot ensure that it will always work on either your end or our end.  If the connection with a video visit is poor, the visit may have to be switched to a telephone visit.  With either a video or telephone visit, we are not always able to ensure that we have a secure connection.     I need to obtain your verbal consent now.   Are you willing to proceed with your visit today? YES   Kathryn Jimenez has provided verbal consent on 02/11/2023 for a virtual visit (video or telephone).   Mary-Margaret Daphine Deutscher, FNP   Date: 02/11/2023 8:20 AM   Virtual Visit via Video Note   I, Mary-Margaret Daphine Deutscher, connected with Kathryn Jimenez ([id number], 06-20-1971) on 02/11/23 at 11:05 AM EDT by a video-enabled telemedicine application and verified that I am speaking with the correct person using two identifiers.  Location: Patient: Virtual Visit Location Patient: Home Provider: Virtual Visit Location Provider: Mobile   I discussed the limitations of  evaluation and management by telemedicine and the availability of in person appointments. The patient expressed understanding and agreed to proceed.    History of Present Illness: Kathryn Jimenez is a 52 y.o. who identifies as a female who was assigned female at birth, and is being seen today for hospital follow up TOC.  HPI:  Today's visit was for Transitional Care Management.  The patient was discharged from [facility] on 01/28/23 with a primary diagnosis of acute on chronic respiratory failure secondary to pneumonia.  Contact with the patient and/or caregiver, by a clinical staff member, was made on 01/29/23 and was documented as a telephone encounter within the EMR.  Through chart review and discussion with the patient I have determined that management of their condition is of high complexity.   Since coming  home she is better. She is on oxygen at home at 1L via nasal canula. O2 sats are running around 85 with and without oxygen. Right now her O2 sat is 80% on oxygen. When she seats it will go to 89%. She was seen by pulmonology while in the hospital. She has follow up appointment with them July 1.    Review of Systems  Constitutional:  Negative for diaphoresis and weight loss.  Eyes:  Negative for blurred vision, double vision and pain.  Respiratory:  Positive for shortness of breath (better).   Cardiovascular:  Negative for chest pain, palpitations, orthopnea and  leg swelling.  Gastrointestinal:  Negative for abdominal pain.  Skin:  Negative for rash.  Neurological:  Negative for dizziness, sensory change, loss of consciousness, weakness and headaches.  Endo/Heme/Allergies:  Negative for polydipsia. Does not bruise/bleed easily.  Psychiatric/Behavioral:  Negative for memory loss. The patient does not have insomnia.   All other systems reviewed and are negative.   Problems:  Patient Active Problem List   Diagnosis Date Noted   Demand ischemia of myocardium 06/18/2022   Chronic heart  failure with preserved ejection fraction (HFpEF) (HCC) 06/18/2022   SVT (supraventricular tachycardia) 06/18/2022   OSA (obstructive sleep apnea) 06/18/2022   Vitamin B12 deficiency 05/07/2021   Goiter 09/12/2020   Hereditary and idiopathic neuropathy, unspecified 09/12/2020   Nontoxic single thyroid nodule 09/12/2020   Presence of insulin pump (external) (internal) 09/12/2020   Morbid obesity with BMI of 50.0-59.9, adult (HCC) 01/12/2020   Urinary incontinence 01/12/2020   Labia enlarged 05/29/2018   Abdominal pannus 05/29/2018   Small vessel disease, cerebrovascular 09/30/2017   Diabetic polyneuropathy associated with type 2 diabetes mellitus (HCC) 07/01/2017   Vitamin D deficiency 07/01/2017   Depression 01/11/2015   PTSD (post-traumatic stress disorder) 01/11/2015   T2DM (type 2 diabetes mellitus) (HCC) 01/11/2015   Primary hypertension 01/11/2015   High cholesterol 01/11/2015   Edema 01/11/2015    Allergies:  Allergies  Allergen Reactions   Actos [Pioglitazone] Swelling   Augmentin [Amoxicillin-Pot Clavulanate] Diarrhea, Nausea Only and Other (See Comments)    Caused severe diarrhea   Citrus Other (See Comments)    "Issues with my tongue"- the fleshy part of the fruit    Invokana [Canagliflozin] Other (See Comments)    Yeast infection   Lopid [Gemfibrozil] Other (See Comments)    Made the tongue burn   Metoprolol Other (See Comments)    Headaches    Naproxen Sodium Other (See Comments)    Patient states she "felt loopy"   Penicillins Diarrhea and Other (See Comments)    Violent diarrhea- must have Diflucan   Pineapple Other (See Comments)    "Issues with my tongue"   Statins Other (See Comments)    Joint pain   Tape Itching and Other (See Comments)    Tape, EKG leads, and Band-Aids - Itching and redness   Amlodipine Cough   Clavulanic Acid Nausea Only   Hctz [Hydrochlorothiazide] Rash   Lisinopril Cough   Morphine And Codeine Itching and Rash   Medications:   Current Outpatient Medications:    ACCU-CHEK GUIDE test strip, , Disp: , Rfl:    acetaminophen (TYLENOL) 500 MG tablet, Take 500 mg by mouth every 6 (six) hours as needed for mild pain or moderate pain., Disp: , Rfl:    albuterol (VENTOLIN HFA) 108 (90 Base) MCG/ACT inhaler, 2 PUFFS INTO THE LUNGS EVERY 6 HOURS AS NEEDED FOR WHEEZING OR SHORTNESS OF BREATH. Strength: 108 (90 Base) MCG/ACT, Disp: 6.7 g, Rfl: 0   budesonide-formoterol (SYMBICORT) 160-4.5 MCG/ACT inhaler, Inhale 2 puffs into the lungs in the morning and at bedtime., Disp: , Rfl:    buPROPion (WELLBUTRIN XL) 150 MG 24 hr tablet, Take 150 mg by mouth daily., Disp: , Rfl:    clobetasol cream (TEMOVATE) 0.05 %, Apply 1 application topically 2 (two) times daily as needed (scalp psoriasis)., Disp: 30 g, Rfl: 0   clopidogrel (PLAVIX) 75 MG tablet, Take 1 tablet (75 mg total) by mouth daily., Disp: 90 tablet, Rfl: 0   Continuous Blood Gluc Sensor (DEXCOM G7 SENSOR) MISC, Monitor  Blood Sugars four times daily , before meals and at bedtime., Disp: 3 each, Rfl: 3   cycloSPORINE (RESTASIS) 0.05 % ophthalmic emulsion, Place 1 drop into both eyes 2 (two) times daily., Disp: , Rfl:    diphenhydrAMINE (BENADRYL) 25 mg capsule, Take 25 mg by mouth every 6 (six) hours as needed for allergies., Disp: , Rfl:    ezetimibe (ZETIA) 10 MG tablet, Take 1 tablet (10 mg total) by mouth daily., Disp: 30 tablet, Rfl: 3   fluconazole (DIFLUCAN) 150 MG tablet, Take one tablet once then repeat in 3 days if needed, Disp: 2 tablet, Rfl: 4   FLUoxetine (PROZAC) 40 MG capsule, Take 40 mg by mouth daily., Disp: , Rfl:    furosemide (LASIX) 40 MG tablet, Take 1 tablet (40 mg total) by mouth in the morning. (Patient not taking: Reported on 01/29/2023), Disp: 90 tablet, Rfl: 0   guaiFENesin (MUCINEX) 600 MG 12 hr tablet, Take 1 tablet (600 mg total) by mouth 2 (two) times daily. (Patient taking differently: Take 600 mg by mouth 2 (two) times daily as needed for to loosen  phlegm or cough.), Disp: 30 tablet, Rfl: 0   Incontinence Supply Disposable (PREVAIL IB FULL MAT BRIEF 2XL) MISC, 1 each by Does not apply route every 2 (two) hours as needed., Disp: 96 each, Rfl: 5   insulin aspart (NOVOLOG FLEXPEN) 100 UNIT/ML FlexPen, Inject 14-20 Units into the skin 3 (three) times daily with meals., Disp: 75 mL, Rfl: 3   insulin glargine (LANTUS SOLOSTAR) 100 UNIT/ML Solostar Pen, Inject 35 Units into the skin at bedtime., Disp: 33 mL, Rfl: 3   Insulin Pen Needle (CAREFINE PEN NEEDLES) 31G X 6 MM MISC, Use to inject insulin 4 times daily, Disp: 100 each, Rfl: 11   Insulin Pen Needle (GLOBAL EASE INJECT PEN NEEDLES) 31G X 5 MM MISC, USE TO INJECT INSULIN 4 TIMES A DAY., Disp: 100 each, Rfl: 3   levonorgestrel (LILETTA, 52 MG,) 20.1 MCG/DAY IUD, 1 each by Intrauterine route once. Inserted 04/19/21, Disp: , Rfl:    LORazepam (ATIVAN) 0.5 MG tablet, Take 0.25-0.5 mg by mouth daily as needed for anxiety., Disp: , Rfl:    losartan (COZAAR) 50 MG tablet, Take 1 tablet (50 mg total) by mouth in the morning and at bedtime., Disp: 180 tablet, Rfl: 3   metoprolol succinate (TOPROL-XL) 50 MG 24 hr tablet, Take 1 tablet (50 mg total) by mouth daily. Take with or immediately following a meal., Disp: 90 tablet, Rfl: 0   nystatin (MYCOSTATIN/NYSTOP) powder, Apply 1 application  topically daily as needed (for fungal infections)., Disp: , Rfl:    OVER THE COUNTER MEDICATION, Take 1-2 drops by mouth daily as needed (Thyroid Function). Potassium Iodide 4 %; Iodine 2 %; Distilled water, Disp: , Rfl:    oxybutynin (DITROPAN-XL) 10 MG 24 hr tablet, Take 1 tablet (10 mg total) by mouth at bedtime., Disp: 30 tablet, Rfl: 1   OXYGEN, Inhale 4 L/min into the lungs continuous., Disp: , Rfl:    potassium chloride (KLOR-CON) 10 MEQ tablet, Take 2 tablets (20 mEq total) by mouth daily., Disp: 180 tablet, Rfl: 1   terconazole (TERAZOL 3) 0.8 % vaginal cream, Place 1 applicator vaginally at bedtime., Disp: 20 g,  Rfl: 0   torsemide (DEMADEX) 20 MG tablet, Take 20 mg by mouth once. Can take extra tablet with weight gain greater than 3 lb in 24 hours, Disp: , Rfl:    umeclidinium bromide (INCRUSE ELLIPTA) 62.5 MCG/ACT AEPB,  Inhale 1 puff into the lungs daily., Disp: , Rfl:    valACYclovir (VALTREX) 500 MG tablet, Take 1 tablet (500 mg total) by mouth daily., Disp: 30 tablet, Rfl: 0   Vitamin D, Ergocalciferol, (DRISDOL) 1.25 MG (50000 UNIT) CAPS capsule, Take 1 capsule (50,000 Units total) by mouth every 7 (seven) days for 12 doses., Disp: 12 capsule, Rfl: 0  Observations/Objective: Patient is well-developed, well-nourished in no acute distress.  Resting comfortably  at home.  Head is normocephalic, atraumatic.  No labored breathing.  Speech is clear and coherent with logical content.  Patient is alert and oriented at baseline.    Assessment and Plan:  Kathryn Jimenez in today with chief complaint of Hospitalization Follow-up   1. Encounter for support and coordination of transition of care Reviewed hospital records  2. Acute on chronic respiratory failure with hypoxia and hypercapnia (HCC) Patient does not want to go to the hospital.  Turn oxygen up to 2L If SOB  and low O2 sat needs  to go back  to the hospital. Keep follow up with pulmonology   Follow Up Instructions: I discussed the assessment and treatment plan with the patient. The patient was provided an opportunity to ask questions and all were answered. The patient agreed with the plan and demonstrated an understanding of the instructions.  A copy of instructions were sent to the patient via MyChart.  The patient was advised to call back or seek an in-person evaluation if the symptoms worsen or if the condition fails to improve as anticipated.  Time:  I spent 10 minutes with the patient via telehealth technology discussing the above problems/concerns.    Mary-Margaret Daphine Deutscher, FNP

## 2023-02-11 NOTE — Addendum Note (Signed)
Addended by: Bennie Pierini on: 02/11/2023 02:43 PM   Modules accepted: Level of Service

## 2023-02-11 NOTE — Patient Instructions (Addendum)
Kathryn Jimenez, thank you for joining Bennie Pierini, FNP for today's virtual visit.  While this provider is not your primary care provider (PCP), if your PCP is located in our provider database this encounter information will be shared with them immediately following your visit.   A [place] MyChart account gives you access to today's visit and all your visits, tests, and labs performed at [facility] " click here if you don't have a [place] MyChart account or go to [email or web address]  Consent: (Patient) Kathryn Jimenez provided verbal consent for this virtual visit at the beginning of the encounter.  Current Medications:  Current Outpatient Medications:    ACCU-CHEK GUIDE test strip, , Disp: , Rfl:    acetaminophen (TYLENOL) 500 MG tablet, Take 500 mg by mouth every 6 (six) hours as needed for mild pain or moderate pain., Disp: , Rfl:    albuterol (VENTOLIN HFA) 108 (90 Base) MCG/ACT inhaler, 2 PUFFS INTO THE LUNGS EVERY 6 HOURS AS NEEDED FOR WHEEZING OR SHORTNESS OF BREATH. Strength: 108 (90 Base) MCG/ACT, Disp: 6.7 g, Rfl: 0   budesonide-formoterol (SYMBICORT) 160-4.5 MCG/ACT inhaler, Inhale 2 puffs into the lungs in the morning and at bedtime., Disp: , Rfl:    buPROPion (WELLBUTRIN XL) 150 MG 24 hr tablet, Take 150 mg by mouth daily., Disp: , Rfl:    clobetasol cream (TEMOVATE) 0.05 %, Apply 1 application topically 2 (two) times daily as needed (scalp psoriasis)., Disp: 30 g, Rfl: 0   clopidogrel (PLAVIX) 75 MG tablet, Take 1 tablet (75 mg total) by mouth daily., Disp: 90 tablet, Rfl: 0   Continuous Blood Gluc Sensor (DEXCOM G7 SENSOR) MISC, Monitor Blood Sugars four times daily , before meals and at bedtime., Disp: 3 each, Rfl: 3   cycloSPORINE (RESTASIS) 0.05 % ophthalmic emulsion, Place 1 drop into both eyes 2 (two) times daily., Disp: , Rfl:    diphenhydrAMINE (BENADRYL) 25 mg capsule, Take 25 mg by mouth every 6 (six) hours as needed for allergies., Disp: ,  Rfl:    ezetimibe (ZETIA) 10 MG tablet, Take 1 tablet (10 mg total) by mouth daily., Disp: 30 tablet, Rfl: 3   fluconazole (DIFLUCAN) 150 MG tablet, Take one tablet once then repeat in 3 days if needed, Disp: 2 tablet, Rfl: 4   FLUoxetine (PROZAC) 40 MG capsule, Take 40 mg by mouth daily., Disp: , Rfl:    furosemide (LASIX) 40 MG tablet, Take 1 tablet (40 mg total) by mouth in the morning. (Patient not taking: Reported on 01/29/2023), Disp: 90 tablet, Rfl: 0   guaiFENesin (MUCINEX) 600 MG 12 hr tablet, Take 1 tablet (600 mg total) by mouth 2 (two) times daily. (Patient taking differently: Take 600 mg by mouth 2 (two) times daily as needed for to loosen phlegm or cough.), Disp: 30 tablet, Rfl: 0   Incontinence Supply Disposable (PREVAIL IB FULL MAT BRIEF 2XL) MISC, 1 each by Does not apply route every 2 (two) hours as needed., Disp: 96 each, Rfl: 5   insulin aspart (NOVOLOG FLEXPEN) 100 UNIT/ML FlexPen, Inject 14-20 Units into the skin 3 (three) times daily with meals., Disp: 75 mL, Rfl: 3   insulin glargine (LANTUS SOLOSTAR) 100 UNIT/ML Solostar Pen, Inject 35 Units into the skin at bedtime., Disp: 33 mL, Rfl: 3   Insulin Pen Needle (CAREFINE PEN NEEDLES) 31G X 6 MM MISC, Use to inject insulin 4 times daily, Disp: 100 each, Rfl: 11   Insulin Pen Needle (GLOBAL EASE INJECT PEN NEEDLES) 31G X  5 MM MISC, USE TO INJECT INSULIN 4 TIMES A DAY., Disp: 100 each, Rfl: 3   levonorgestrel (LILETTA, 52 MG,) 20.1 MCG/DAY IUD, 1 each by Intrauterine route once. Inserted 04/19/21, Disp: , Rfl:    LORazepam (ATIVAN) 0.5 MG tablet, Take 0.25-0.5 mg by mouth daily as needed for anxiety., Disp: , Rfl:    losartan (COZAAR) 50 MG tablet, Take 1 tablet (50 mg total) by mouth in the morning and at bedtime., Disp: 180 tablet, Rfl: 3   metoprolol succinate (TOPROL-XL) 50 MG 24 hr tablet, Take 1 tablet (50 mg total) by mouth daily. Take with or immediately following a meal., Disp: 90 tablet, Rfl: 0   nystatin (MYCOSTATIN/NYSTOP)  powder, Apply 1 application  topically daily as needed (for fungal infections)., Disp: , Rfl:    OVER THE COUNTER MEDICATION, Take 1-2 drops by mouth daily as needed (Thyroid Function). Potassium Iodide 4 %; Iodine 2 %; Distilled water, Disp: , Rfl:    oxybutynin (DITROPAN-XL) 10 MG 24 hr tablet, Take 1 tablet (10 mg total) by mouth at bedtime., Disp: 30 tablet, Rfl: 1   OXYGEN, Inhale 4 L/min into the lungs continuous., Disp: , Rfl:    potassium chloride (KLOR-CON) 10 MEQ tablet, Take 2 tablets (20 mEq total) by mouth daily., Disp: 180 tablet, Rfl: 1   terconazole (TERAZOL 3) 0.8 % vaginal cream, Place 1 applicator vaginally at bedtime., Disp: 20 g, Rfl: 0   torsemide (DEMADEX) 20 MG tablet, Take 20 mg by mouth once. Can take extra tablet with weight gain greater than 3 lb in 24 hours, Disp: , Rfl:    umeclidinium bromide (INCRUSE ELLIPTA) 62.5 MCG/ACT AEPB, Inhale 1 puff into the lungs daily., Disp: , Rfl:    valACYclovir (VALTREX) 500 MG tablet, Take 1 tablet (500 mg total) by mouth daily., Disp: 30 tablet, Rfl: 0   Vitamin D, Ergocalciferol, (DRISDOL) 1.25 MG (50000 UNIT) CAPS capsule, Take 1 capsule (50,000 Units total) by mouth every 7 (seven) days for 12 doses., Disp: 12 capsule, Rfl: 0   Medications ordered in this encounter:  No orders of the defined types were placed in this encounter.    *If you need refills on other medications prior to your next appointment, please contact your pharmacy*  Follow-Up: Call back or seek an in-person evaluation if the symptoms worsen or if the condition fails to improve as anticipated.  [facility] [phone]  Other Instructions Turn oxygen up to 2L If SOB  and low O2 sat needs  to go back  to the hospital. Keep follow up with pulmonology    If you have been instructed to have an in-person evaluation today at a local Urgent Care facility, please use the link below. It will take you to a list of all of our available [place]  [facility]gent [facility], including address, phone number and hours of operation. Please do not delay care.  [place] [facility]  If you or a family member do not have a primary care provider, use the link below to schedule a visit and establish care. When you choose a [place] primary care physician or advanced practice provider, you gain a long-term partner in health. Find a Primary Care Provider  Learn more about [place]'s in-office and virtual care options: [place] - Get Care Now

## 2023-02-18 IMAGING — CT CT HEAD W/O CM
4 series · 16 of 47 positions shown, 18 images · non-contrast
Comparison: 09/17/2017

CLINICAL DATA: Visual impairment, flashing lights, eye twitching,
symptoms for couple months, history of stroke, diabetes mellitus,
uncontrolled hypertension



[Series 2: head w o · axial · 0.48mm/px · z∈[+151,+271]mm · 7 of 33 slices shown, 9 images]
[im 5/33  brain]
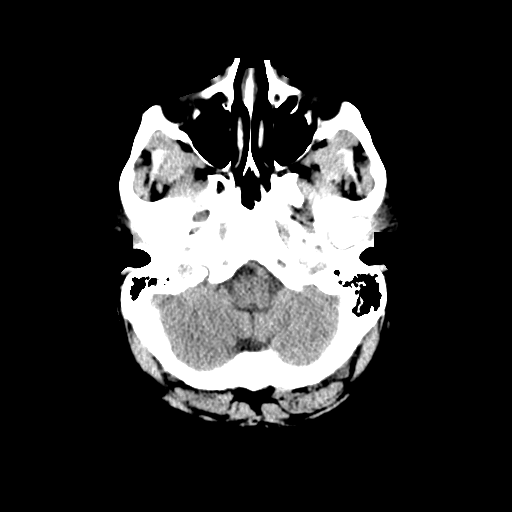
[im 5/33  bone]
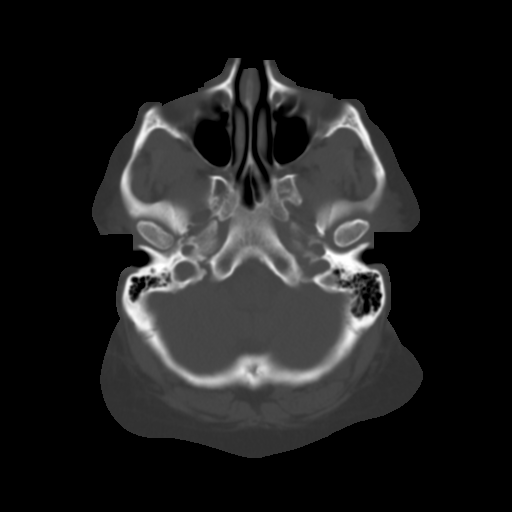
[im 9/33  brain]
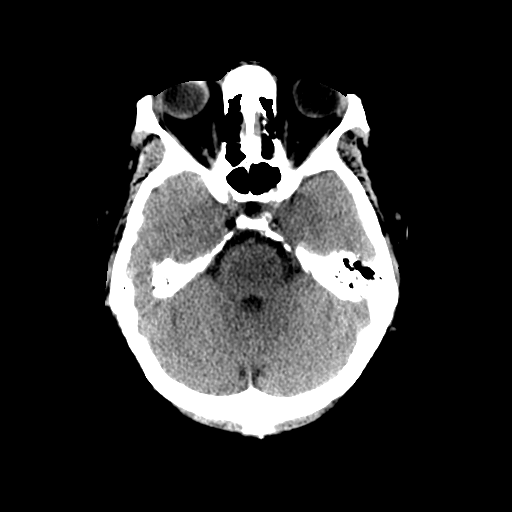
[im 13/33  brain]
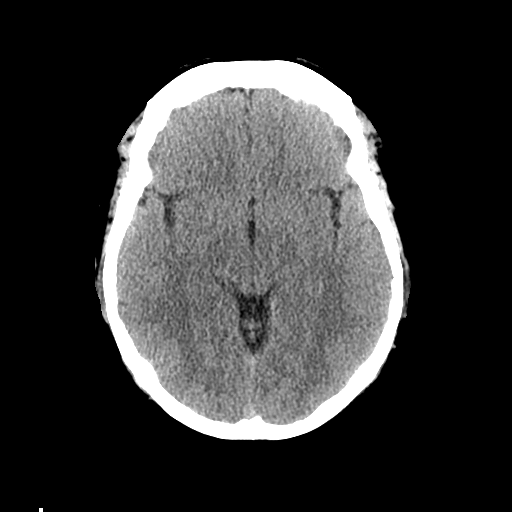
[im 17/33  brain]
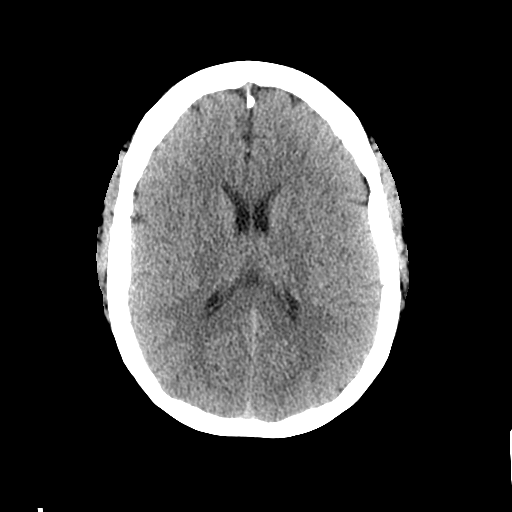
[im 21/33  brain]
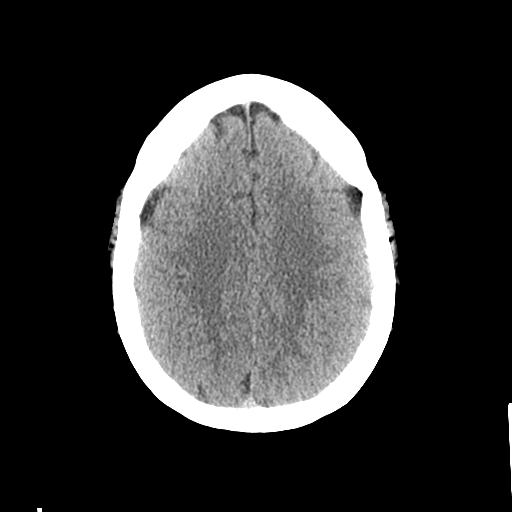
[im 21/33  bone]
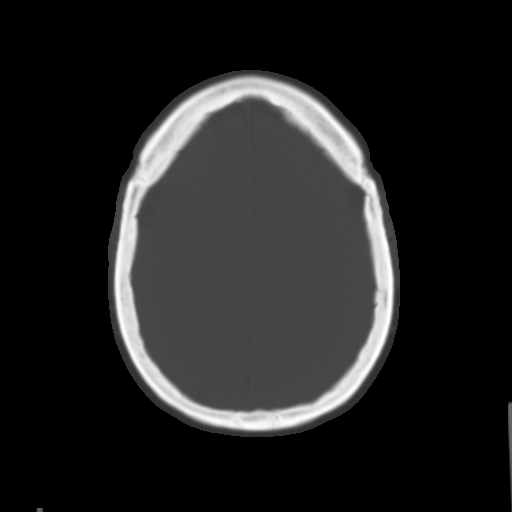
[im 25/33  brain]
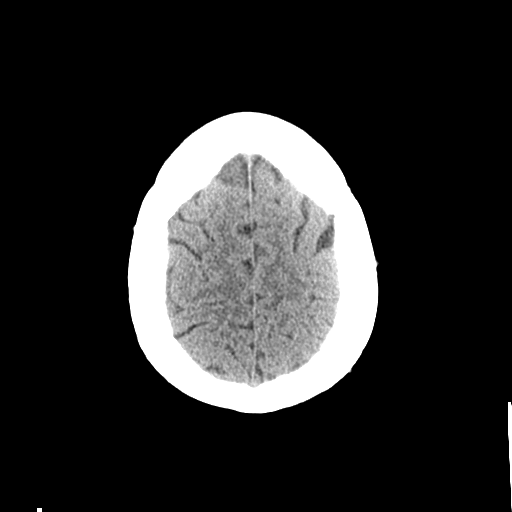
[im 29/33  brain]
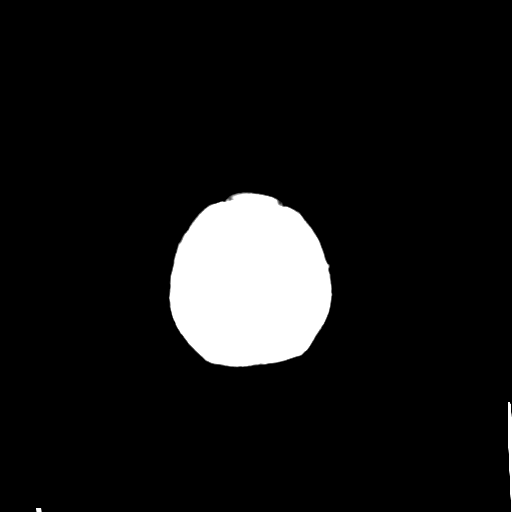

[Series 3: head bone · axial · 0.48mm/px · z∈[+147,+179]mm · 3 of 83 slices shown]
[im 9/83  bone]
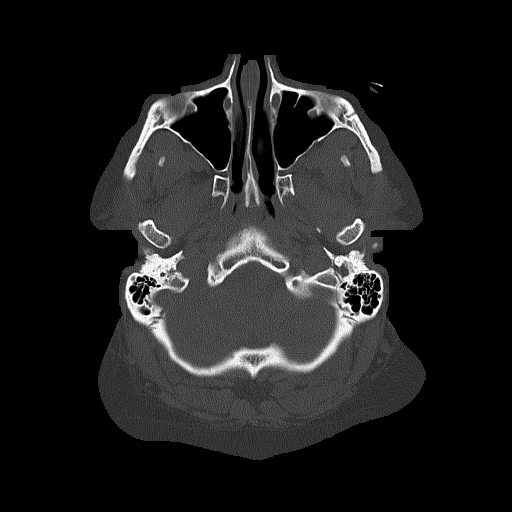
[im 17/83  bone]
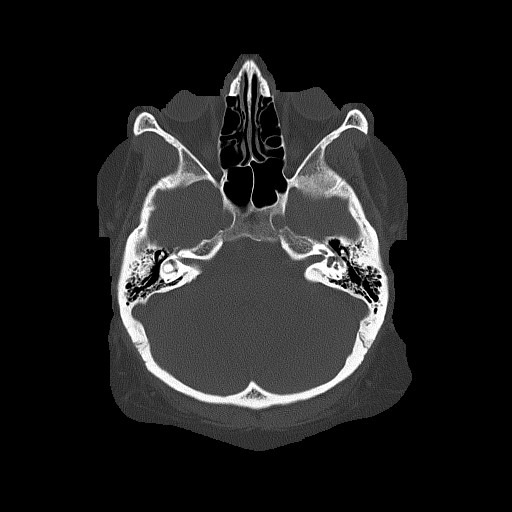
[im 25/83  bone]
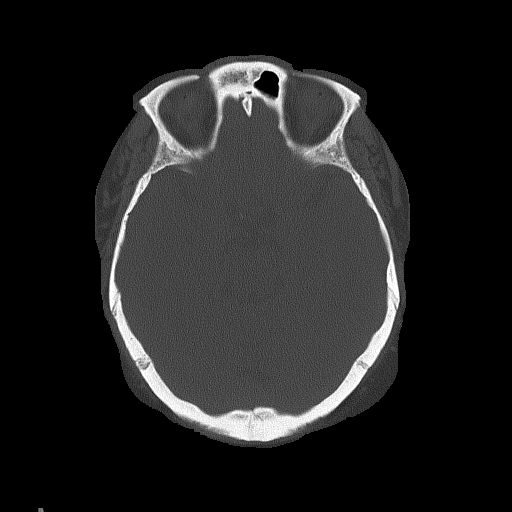

[Series 4: coronal soft · coronal · 0.33mm/px · 3 of 75 slices shown]
[im 25/75  brain]
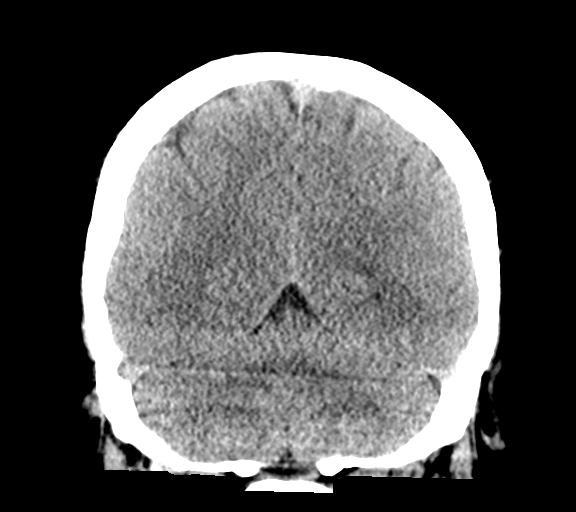
[im 33/75  brain]
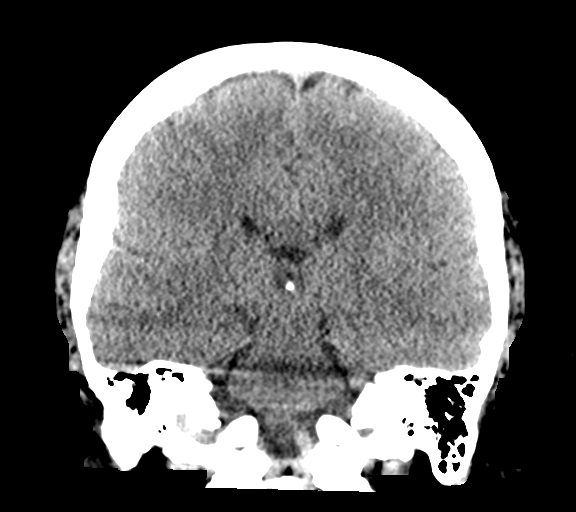
[im 42/75  brain]
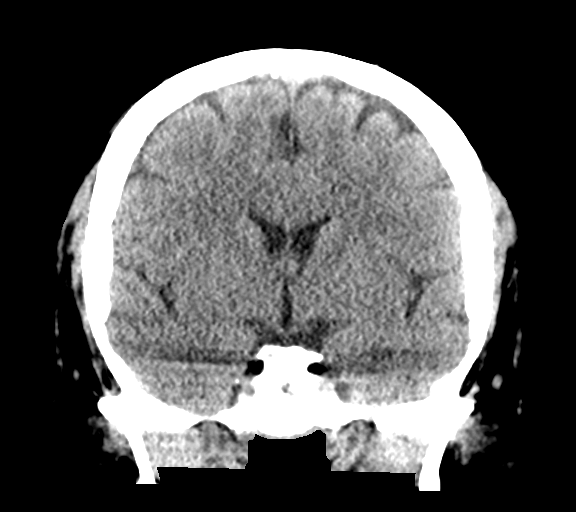

[Series 5: sagittal soft · sagittal · 0.33mm/px · 3 of 64 slices shown]
[im 22/64  brain]
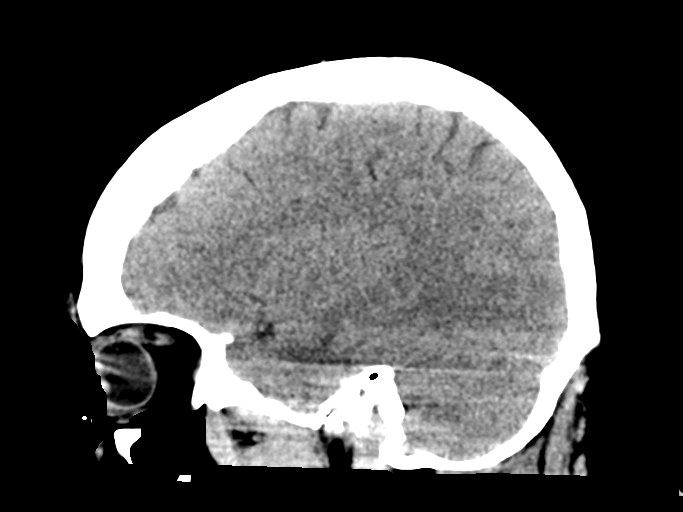
[im 32/64  brain]
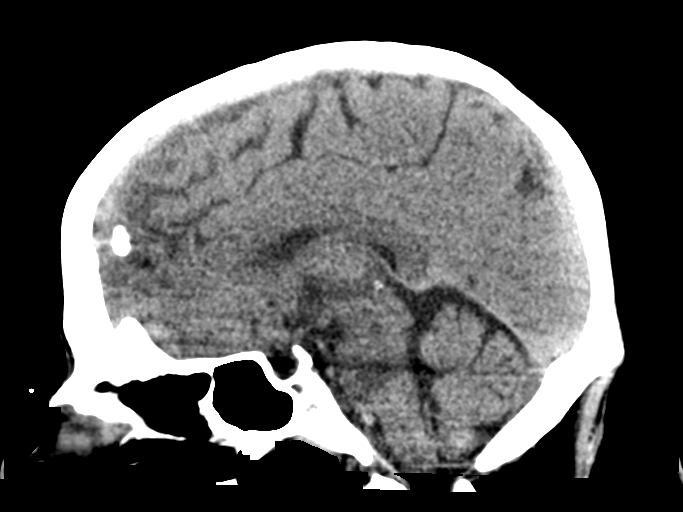
[im 43/64  brain]
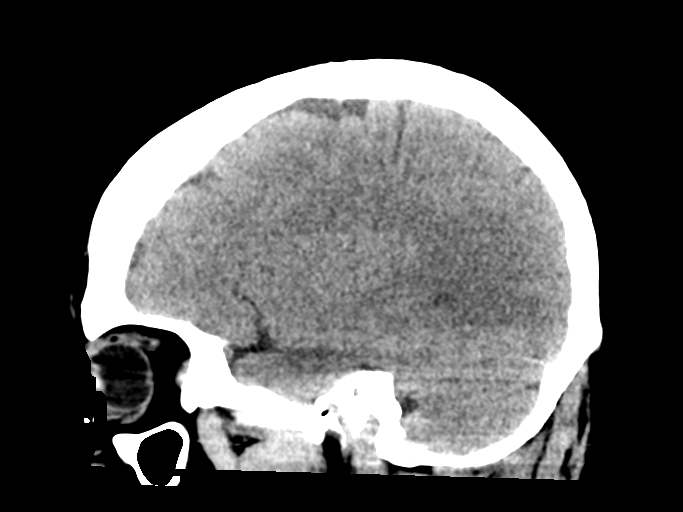

[16 of 47 positions shown; findings below may reference images not displayed]

FINDINGS: Brain: Normal ventricular morphology. No midline shift or mass
effect. Normal appearance of brain parenchyma. No intracranial
hemorrhage, mass lesion, or evidence of acute infarction. No
extra-axial fluid collections.

Vascular: No hyperdense vessels

Skull: Intact

Sinuses/Orbits: Mucosal retention cyst LEFT maxillary sinus.
Remaining visualized paranasal sinuses and mastoid air cells clear

Other: N/A
IMPRESSION: No acute intracranial abnormalities.

## 2023-02-18 NOTE — Addendum Note (Signed)
Addended by: Bennie Pierini on: 02/18/2023 02:49 PM   Modules accepted: Level of Service

## 2023-02-22 ENCOUNTER — Other Ambulatory Visit: Payer: Self-pay

## 2023-02-22 DIAGNOSIS — I679 Cerebrovascular disease, unspecified: Secondary | ICD-10-CM

## 2023-02-22 MED ORDER — TORSEMIDE 20 MG PO TABS: 20.0000 mg | ORAL_TABLET | Freq: Once | ORAL | 0 refills | Status: DC

## 2023-02-22 MED ORDER — CLOPIDOGREL BISULFATE 75 MG PO TABS
75.0000 mg | ORAL_TABLET | Freq: Every day | ORAL | 0 refills | Status: DC
Start: 2023-02-22 — End: 2023-05-16

## 2023-02-22 NOTE — Telephone Encounter (Signed)
Refill approved. Patient needs to follow up with Cardiology as well.

## 2023-02-22 NOTE — Telephone Encounter (Signed)
Last office visit with you 12/14/22 TOC visit with Kathryn Jimenez on 02/11/23 Listed on med list as historical

## 2023-02-26 ENCOUNTER — Other Ambulatory Visit: Payer: Self-pay | Admitting: Family Medicine

## 2023-02-26 ENCOUNTER — Other Ambulatory Visit: Payer: Self-pay | Admitting: *Deleted

## 2023-02-26 MED ORDER — EZETIMIBE 10 MG PO TABS: 10.0000 mg | ORAL_TABLET | Freq: Every day | ORAL | 3 refills | Status: DC

## 2023-02-27 ENCOUNTER — Telehealth: Payer: Self-pay | Admitting: Family Medicine

## 2023-02-27 NOTE — Telephone Encounter (Signed)
Okey Regal (patients care manager from [facility]) called, requesting to speak directly with pts PCP or nurse.   [phone]

## 2023-02-27 NOTE — Telephone Encounter (Signed)
Care manager reports that patient is having problems of incontinence due to coughing. After discovery of clinic notes from last visit this is ongoing and needs to be addressed. Patient was brought into conversation and we set her up with a Mychart video visit (02/28/2023)

## 2023-02-28 ENCOUNTER — Encounter: Payer: Self-pay | Admitting: Family Medicine

## 2023-02-28 ENCOUNTER — Telehealth (INDEPENDENT_AMBULATORY_CARE_PROVIDER_SITE_OTHER): Payer: MEDICAID | Admitting: Family Medicine

## 2023-02-28 DIAGNOSIS — R058 Other specified cough: Secondary | ICD-10-CM

## 2023-02-28 DIAGNOSIS — N393 Stress incontinence (female) (male): Secondary | ICD-10-CM | POA: Diagnosis not present

## 2023-02-28 DIAGNOSIS — Z748 Other problems related to care provider dependency: Secondary | ICD-10-CM

## 2023-02-28 DIAGNOSIS — R0902 Hypoxemia: Secondary | ICD-10-CM

## 2023-02-28 DIAGNOSIS — R159 Full incontinence of feces: Secondary | ICD-10-CM | POA: Diagnosis not present

## 2023-02-28 NOTE — Progress Notes (Addendum)
Virtual Visit via Video Note  I connected with Kathryn Jimenez on 02/28/23 at 11:05 AM EDT by a video enabled telemedicine application and verified that I am speaking with the correct person using two identifiers.  Patient Location: Home Provider Location: Office/Clinic  I discussed the limitations, risks, security, and privacy concerns of performing an evaluation and management service by video and the availability of in person appointments. I also discussed with the patient that there may be a patient responsible charge related to this service. The patient expressed understanding and agreed to proceed.  Subjective: PCP: Arrie Senate, FNP  Chief Complaint  Patient presents with   Cough   Urinary Incontinence   Cough   Patient presents today via telehealth after call from [facility] services requesting patient be seen due to cough and incontinence prohibiting her from leaving her house to make appt. History was collected from chart review and patient history.   Patient states that since she has come home from the hospital where she was treated for Acute respiratory failure in the setting of congestive heart failure, T2DM, and morbid obesity, she has struggled with continued cough causing her to be incontinent of bladder and bowel. Coughing has also led to vomiting. During hospitalization she was evaluated by pulmonology and instructed to follow up for asthma evaluation. States that when she was discharged from [facility] on 01/28/23 she did not require O2. States that within 24 hours she needed O2. She completed TOC visit via telehealth with Paulene Floor of [facility] on 6/26 and was instructed to increase her O2 liters to 2 and to keep her follow up appt with pulmonology. Currently on 2 Liters of O2 [place] and home O2 saturation is 88%. Reports that her O2 sat drops when she is laying down or coughing. When she is walking from bedroom to kitchen she has to stop to catch her breath or due to lack of  energy. States that she has a productive cough, some is white frothy and some is yellow peach. States that she is sleeping on 3 pillows. States that she is taking oxybutynin at night. States that when she eats she feels nauseous. Reports that she has not been anywhere but in her yard due to her issues. Reports that she "doesn't know when it's coming" when referring to her bowel incontinence.  Reports her home BP this am was 117/68  Reports that she has not taken any antibiotics since being in the hospital.  She was not able to make her appt with pulmonology on the 1st of July.  States that she is still taking torsemide, weighing herself daily, standing weight at home this morning was 290 lbs  01/28/23 bedweight at [facility] 301 lb  ROS: Per HPI  Current Outpatient Medications:    ACCU-CHEK GUIDE test strip, , Disp: , Rfl:    acetaminophen (TYLENOL) 500 MG tablet, Take 500 mg by mouth every 6 (six) hours as needed for mild pain or moderate pain., Disp: , Rfl:    albuterol (VENTOLIN HFA) 108 (90 Base) MCG/ACT inhaler, 2 PUFFS INTO THE LUNGS EVERY 6 HOURS AS NEEDED FOR WHEEZING OR SHORTNESS OF BREATH. Strength: 108 (90 Base) MCG/ACT, Disp: 6.7 g, Rfl: 0   budesonide-formoterol (SYMBICORT) 160-4.5 MCG/ACT inhaler, Inhale 2 puffs into the lungs in the morning and at bedtime., Disp: , Rfl:    buPROPion (WELLBUTRIN XL) 150 MG 24 hr tablet, Take 150 mg by mouth daily., Disp: , Rfl:    clobetasol cream (TEMOVATE) 0.05 %,  Apply 1 application topically 2 (two) times daily as needed (scalp psoriasis)., Disp: 30 g, Rfl: 0   clopidogrel (PLAVIX) 75 MG tablet, Take 1 tablet (75 mg total) by mouth daily., Disp: 90 tablet, Rfl: 0   Continuous Blood Gluc Sensor (DEXCOM G7 SENSOR) MISC, Monitor Blood Sugars four times daily , before meals and at bedtime., Disp: 3 each, Rfl: 3   cycloSPORINE (RESTASIS) 0.05 % ophthalmic emulsion, Place 1 drop into both eyes 2 (two) times daily., Disp: , Rfl:    diphenhydrAMINE (BENADRYL)  25 mg capsule, Take 25 mg by mouth every 6 (six) hours as needed for allergies., Disp: , Rfl:    ezetimibe (ZETIA) 10 MG tablet, Take 1 tablet (10 mg total) by mouth daily., Disp: 30 tablet, Rfl: 3   fluconazole (DIFLUCAN) 150 MG tablet, Take one tablet once then repeat in 3 days if needed, Disp: 2 tablet, Rfl: 4   FLUoxetine (PROZAC) 40 MG capsule, Take 40 mg by mouth daily., Disp: , Rfl:    furosemide (LASIX) 40 MG tablet, Take 1 tablet (40 mg total) by mouth in the morning. (Patient not taking: Reported on 01/29/2023), Disp: 90 tablet, Rfl: 0   guaiFENesin (MUCINEX) 600 MG 12 hr tablet, Take 1 tablet (600 mg total) by mouth 2 (two) times daily. (Patient taking differently: Take 600 mg by mouth 2 (two) times daily as needed for to loosen phlegm or cough.), Disp: 30 tablet, Rfl: 0   Incontinence Supply Disposable (PREVAIL IB FULL MAT BRIEF 2XL) MISC, 1 each by Does not apply route every 2 (two) hours as needed., Disp: 96 each, Rfl: 5   insulin aspart (NOVOLOG FLEXPEN) 100 UNIT/ML FlexPen, Inject 14-20 Units into the skin 3 (three) times daily with meals., Disp: 75 mL, Rfl: 3   insulin glargine (LANTUS SOLOSTAR) 100 UNIT/ML Solostar Pen, Inject 35 Units into the skin at bedtime., Disp: 33 mL, Rfl: 3   Insulin Pen Needle (CAREFINE PEN NEEDLES) 31G X 6 MM MISC, Use to inject insulin 4 times daily, Disp: 100 each, Rfl: 11   Insulin Pen Needle (GLOBAL EASE INJECT PEN NEEDLES) 31G X 5 MM MISC, USE TO INJECT INSULIN 4 TIMES A DAY., Disp: 100 each, Rfl: 3   levonorgestrel (LILETTA, 52 MG,) 20.1 MCG/DAY IUD, 1 each by Intrauterine route once. Inserted 04/19/21, Disp: , Rfl:    LORazepam (ATIVAN) 0.5 MG tablet, Take 0.25-0.5 mg by mouth daily as needed for anxiety., Disp: , Rfl:    losartan (COZAAR) 50 MG tablet, Take 1 tablet (50 mg total) by mouth in the morning and at bedtime., Disp: 180 tablet, Rfl: 3   metoprolol succinate (TOPROL-XL) 50 MG 24 hr tablet, Take 1 tablet (50 mg total) by mouth daily. Take with  or immediately following a meal., Disp: 90 tablet, Rfl: 0   montelukast (SINGULAIR) 10 MG tablet, take one tablet (10 MILLIGRAM dose) by mouth at bedtime., Disp: 30 tablet, Rfl: 0   nystatin (MYCOSTATIN/NYSTOP) powder, Apply 1 application  topically daily as needed (for fungal infections)., Disp: , Rfl:    OVER THE COUNTER MEDICATION, Take 1-2 drops by mouth daily as needed (Thyroid Function). Potassium Iodide 4 %; Iodine 2 %; Distilled water, Disp: , Rfl:    oxybutynin (DITROPAN-XL) 10 MG 24 hr tablet, Take 1 tablet (10 mg total) by mouth at bedtime., Disp: 30 tablet, Rfl: 1   OXYGEN, Inhale 4 L/min into the lungs continuous., Disp: , Rfl:    potassium chloride (KLOR-CON) 10 MEQ tablet, Take 2 tablets (  20 mEq total) by mouth daily., Disp: 180 tablet, Rfl: 1   terconazole (TERAZOL 3) 0.8 % vaginal cream, Place 1 applicator vaginally at bedtime., Disp: 20 g, Rfl: 0   torsemide (DEMADEX) 20 MG tablet, Take 1 tablet (20 mg total) by mouth once for 1 dose. Can take extra tablet with weight gain greater than 3 lb in 24 hours, Disp: 30 tablet, Rfl: 0   umeclidinium bromide (INCRUSE ELLIPTA) 62.5 MCG/ACT AEPB, Inhale 1 puff into the lungs daily., Disp: , Rfl:    valACYclovir (VALTREX) 500 MG tablet, Take 1 tablet (500 mg total) by mouth daily., Disp: 30 tablet, Rfl: 0   Vitamin D, Ergocalciferol, (DRISDOL) 1.25 MG (50000 UNIT) CAPS capsule, Take 1 capsule (50,000 Units total) by mouth every 7 (seven) days for 12 doses., Disp: 12 capsule, Rfl: 0  Observations/Objective: There were no vitals filed for this visit. Physical Exam Constitutional:      Appearance: She is ill-appearing and diaphoretic. She is not toxic-appearing.  Pulmonary:     Comments: Increased Work of breathing  [place] present in bilateral nares  Skin:    Coloration: Skin is pale.  Neurological:     General: No focal deficit present.     Mental Status: She is oriented to person, place, and time. Mental status is at baseline.   Psychiatric:        Mood and Affect: Mood normal.        Behavior: Behavior normal.        Thought Content: Thought content normal.        Judgment: Judgment normal.    Assessment and Plan: 1. Hypoxia Patient attempted to increase home O2 to 4 Liters. After some time she was able increase her O2 from 88% to 92%. On occasion it rose to 95%, however it was not maintained and this was at rest, not involving any activity or change of position. Discussed with patient at length that given she was not able to consistently improve her O2 saturation at home with nasal canula that she would have to present to ED for evaluation due to continue hypoxia. Needs to establish and follow up with pulmonology.   2. Other cough Continued cough from recent pneumonia. Follow up with ED for evaluation and pulmonology.   3. Stress incontinence of urine Continue taking oxybutynin. Continue taking torsemide. Utilize incontinence supplies. Will need in person appt to evaluate for infection.   4. Incontinence of feces, unspecified fecal incontinence type Will need in person appt to evaluate for infection.   5. Assistance needed with transportation Patient states that she has access to transportation with [employer]. However, due to her symptoms of incontinence she is not able to ride. Will reach out to resources in office to determine what resources are available for patient to have access to appt.   Total time spent on video 13:03  Follow Up Instructions: Return in about 1 week (around 03/07/2023) for Chronic Condition Follow up.   I discussed the assessment and treatment plan with the patient. The patient was provided an opportunity to ask questions, and all were answered. The patient agreed with the plan and demonstrated an understanding of the instructions.   The patient was advised to call back or seek an in-person evaluation if the symptoms worsen or if the condition fails to improve as anticipated.  The above  assessment and management plan was discussed with the patient. The patient verbalized understanding of and has agreed to the management plan.   Jerrel Ivory  Ellamae Sia, DNP-FNP [facility] [address] [place], [place] [address] [phone]

## 2023-03-15 ENCOUNTER — Encounter (HOSPITAL_COMMUNITY): Payer: Self-pay

## 2023-03-16 ENCOUNTER — Encounter (HOSPITAL_COMMUNITY): Payer: Self-pay | Admitting: Family Medicine

## 2023-03-16 ENCOUNTER — Inpatient Hospital Stay (HOSPITAL_COMMUNITY)
Admission: RE | Admit: 2023-03-16 | Discharge: 2023-03-19 | DRG: 291 | Disposition: A | Discharge status: Home / Self Care | Payer: MEDICAID | Source: Other Acute Inpatient Hospital | Attending: Internal Medicine | Admitting: Internal Medicine

## 2023-03-16 DIAGNOSIS — Z8249 Family history of ischemic heart disease and other diseases of the circulatory system: Secondary | ICD-10-CM | POA: Diagnosis not present

## 2023-03-16 DIAGNOSIS — Z975 Presence of (intrauterine) contraceptive device: Secondary | ICD-10-CM

## 2023-03-16 DIAGNOSIS — F603 Borderline personality disorder: Secondary | ICD-10-CM | POA: Diagnosis present

## 2023-03-16 DIAGNOSIS — F32A Depression, unspecified: Secondary | ICD-10-CM | POA: Diagnosis present

## 2023-03-16 DIAGNOSIS — Z880 Allergy status to penicillin: Secondary | ICD-10-CM

## 2023-03-16 DIAGNOSIS — I252 Old myocardial infarction: Secondary | ICD-10-CM | POA: Diagnosis not present

## 2023-03-16 DIAGNOSIS — I110 Hypertensive heart disease with heart failure: Secondary | ICD-10-CM | POA: Diagnosis present

## 2023-03-16 DIAGNOSIS — F331 Major depressive disorder, recurrent, moderate: Secondary | ICD-10-CM | POA: Diagnosis not present

## 2023-03-16 DIAGNOSIS — E785 Hyperlipidemia, unspecified: Secondary | ICD-10-CM | POA: Diagnosis present

## 2023-03-16 DIAGNOSIS — J9611 Chronic respiratory failure with hypoxia: Secondary | ICD-10-CM

## 2023-03-16 DIAGNOSIS — Z6841 Body Mass Index (BMI) 40.0 and over, adult: Secondary | ICD-10-CM

## 2023-03-16 DIAGNOSIS — R601 Generalized edema: Secondary | ICD-10-CM

## 2023-03-16 DIAGNOSIS — E876 Hypokalemia: Secondary | ICD-10-CM

## 2023-03-16 DIAGNOSIS — E1142 Type 2 diabetes mellitus with diabetic polyneuropathy: Secondary | ICD-10-CM | POA: Diagnosis not present

## 2023-03-16 DIAGNOSIS — I5033 Acute on chronic diastolic (congestive) heart failure: Secondary | ICD-10-CM | POA: Diagnosis not present

## 2023-03-16 DIAGNOSIS — J9621 Acute and chronic respiratory failure with hypoxia: Secondary | ICD-10-CM | POA: Diagnosis not present

## 2023-03-16 DIAGNOSIS — Z803 Family history of malignant neoplasm of breast: Secondary | ICD-10-CM

## 2023-03-16 DIAGNOSIS — E871 Hypo-osmolality and hyponatremia: Secondary | ICD-10-CM | POA: Diagnosis present

## 2023-03-16 DIAGNOSIS — I10 Essential (primary) hypertension: Secondary | ICD-10-CM | POA: Diagnosis not present

## 2023-03-16 DIAGNOSIS — Z7951 Long term (current) use of inhaled steroids: Secondary | ICD-10-CM

## 2023-03-16 DIAGNOSIS — Z7902 Long term (current) use of antithrombotics/antiplatelets: Secondary | ICD-10-CM

## 2023-03-16 DIAGNOSIS — Z885 Allergy status to narcotic agent status: Secondary | ICD-10-CM

## 2023-03-16 DIAGNOSIS — Z841 Family history of disorders of kidney and ureter: Secondary | ICD-10-CM

## 2023-03-16 DIAGNOSIS — Z794 Long term (current) use of insulin: Secondary | ICD-10-CM | POA: Diagnosis not present

## 2023-03-16 DIAGNOSIS — Z888 Allergy status to other drugs, medicaments and biological substances status: Secondary | ICD-10-CM

## 2023-03-16 DIAGNOSIS — E119 Type 2 diabetes mellitus without complications: Secondary | ICD-10-CM

## 2023-03-16 DIAGNOSIS — Z881 Allergy status to other antibiotic agents status: Secondary | ICD-10-CM

## 2023-03-16 DIAGNOSIS — Z5911 Inadequate housing environmental temperature: Secondary | ICD-10-CM

## 2023-03-16 DIAGNOSIS — Z800 Family history of malignant neoplasm of digestive organs: Secondary | ICD-10-CM

## 2023-03-16 DIAGNOSIS — Z981 Arthrodesis status: Secondary | ICD-10-CM

## 2023-03-16 DIAGNOSIS — Z83438 Family history of other disorder of lipoprotein metabolism and other lipidemia: Secondary | ICD-10-CM

## 2023-03-16 DIAGNOSIS — Z8673 Personal history of transient ischemic attack (TIA), and cerebral infarction without residual deficits: Secondary | ICD-10-CM | POA: Diagnosis not present

## 2023-03-16 DIAGNOSIS — I2510 Atherosclerotic heart disease of native coronary artery without angina pectoris: Secondary | ICD-10-CM | POA: Diagnosis not present

## 2023-03-16 DIAGNOSIS — F4310 Post-traumatic stress disorder, unspecified: Secondary | ICD-10-CM | POA: Diagnosis present

## 2023-03-16 DIAGNOSIS — Z87442 Personal history of urinary calculi: Secondary | ICD-10-CM | POA: Diagnosis not present

## 2023-03-16 DIAGNOSIS — J189 Pneumonia, unspecified organism: Secondary | ICD-10-CM | POA: Diagnosis present

## 2023-03-16 DIAGNOSIS — Z9981 Dependence on supplemental oxygen: Secondary | ICD-10-CM

## 2023-03-16 DIAGNOSIS — Z5989 Other problems related to housing and economic circumstances: Secondary | ICD-10-CM

## 2023-03-16 DIAGNOSIS — I471 Supraventricular tachycardia, unspecified: Secondary | ICD-10-CM | POA: Diagnosis present

## 2023-03-16 DIAGNOSIS — Z8261 Family history of arthritis: Secondary | ICD-10-CM

## 2023-03-16 DIAGNOSIS — Z825 Family history of asthma and other chronic lower respiratory diseases: Secondary | ICD-10-CM

## 2023-03-16 DIAGNOSIS — J45909 Unspecified asthma, uncomplicated: Secondary | ICD-10-CM | POA: Diagnosis present

## 2023-03-16 DIAGNOSIS — Z833 Family history of diabetes mellitus: Secondary | ICD-10-CM

## 2023-03-16 DIAGNOSIS — Z79899 Other long term (current) drug therapy: Secondary | ICD-10-CM

## 2023-03-16 DIAGNOSIS — Z886 Allergy status to analgesic agent status: Secondary | ICD-10-CM

## 2023-03-16 DIAGNOSIS — I2583 Coronary atherosclerosis due to lipid rich plaque: Secondary | ICD-10-CM | POA: Diagnosis not present

## 2023-03-16 DIAGNOSIS — F339 Major depressive disorder, recurrent, unspecified: Secondary | ICD-10-CM | POA: Diagnosis present

## 2023-03-16 DIAGNOSIS — Z818 Family history of other mental and behavioral disorders: Secondary | ICD-10-CM

## 2023-03-16 DIAGNOSIS — Z91048 Other nonmedicinal substance allergy status: Secondary | ICD-10-CM

## 2023-03-16 DIAGNOSIS — Z91018 Allergy to other foods: Secondary | ICD-10-CM

## 2023-03-16 DIAGNOSIS — Z801 Family history of malignant neoplasm of trachea, bronchus and lung: Secondary | ICD-10-CM

## 2023-03-16 DIAGNOSIS — I509 Heart failure, unspecified: Secondary | ICD-10-CM

## 2023-03-16 HISTORY — DX: Heart failure, unspecified: I50.9

## 2023-03-16 HISTORY — DX: Atherosclerotic heart disease of native coronary artery without angina pectoris: I25.10

## 2023-03-16 LAB — GLUCOSE, CAPILLARY: Glucose-Capillary: 379 mg/dL — ABNORMAL HIGH (ref 70–99)

## 2023-03-16 MED ORDER — INSULIN ASPART 100 UNIT/ML IJ SOLN: 0.0000 [IU] | Freq: Three times a day (TID) | INTRAMUSCULAR | Status: DC

## 2023-03-16 MED ORDER — INSULIN ASPART 100 UNIT/ML IJ SOLN
0.0000 [IU] | Freq: Three times a day (TID) | INTRAMUSCULAR | Status: DC
  Administered 2023-03-16: 20 [IU] via SUBCUTANEOUS
  Administered 2023-03-17: 11 [IU] via SUBCUTANEOUS
  Administered 2023-03-17: 7 [IU] via SUBCUTANEOUS
  Administered 2023-03-17: 15 [IU] via SUBCUTANEOUS
  Administered 2023-03-17: 11 [IU] via SUBCUTANEOUS
  Administered 2023-03-18 (×2): 15 [IU] via SUBCUTANEOUS
  Administered 2023-03-18: 11 [IU] via SUBCUTANEOUS
  Administered 2023-03-18: 15 [IU] via SUBCUTANEOUS
  Administered 2023-03-19: 11 [IU] via SUBCUTANEOUS

## 2023-03-16 MED ORDER — ENOXAPARIN SODIUM 40 MG/0.4ML IJ SOSY
40.0000 mg | PREFILLED_SYRINGE | INTRAMUSCULAR | Status: DC
  Administered 2023-03-16 – 2023-03-18 (×3): 40 mg via SUBCUTANEOUS
  Filled 2023-03-16 (×3): qty 0.4

## 2023-03-16 NOTE — Assessment & Plan Note (Addendum)
-  A1C 9.8 two weeks ago -Sliding scale insulin

## 2023-03-16 NOTE — Assessment & Plan Note (Signed)
-  K of 3.1 -will keep on daily oral potassium while being diuresis -follow BMP

## 2023-03-16 NOTE — Assessment & Plan Note (Signed)
-  Uses oxygen as needed at home up to 2 L but required [facility] and [facility]. Now weaned to [facility] -Continue to wean as tolerated with IV diuresis -Had documented CTA chest in June showing pneumonitis vs pneumonia. CXR with possible pneumonia but symptoms more align with CHF so will hold antibiotics. Low suspicion for PE. -had pulmonology consult with extensive workup with for autoimmune interstitial lung disease recently which was all negative or within normal limits including RNP antibodies, Smith antibodies, anti-Jo1 antibodies, ACE level, SSA and SSB antibodies, ANA double-stranded DNA antibodies, ANCA antibodies, antiglomerular basement membrane antibodies, anti-CCP antibodies, complements, ANA, anticentromere, and QuantiFERON gold. Rheumatoid factor was slightly elevated at 16 however patient had no joint pains or other symptoms of inflammatory arthritis and it was felt that this diagnosis was extremely likely.  -needs PFT outpatient

## 2023-03-16 NOTE — Assessment & Plan Note (Signed)
-  continue losartan

## 2023-03-16 NOTE — Assessment & Plan Note (Signed)
noted 

## 2023-03-16 NOTE — Assessment & Plan Note (Signed)
Depression -continue bupropion

## 2023-03-16 NOTE — H&P (Signed)
History and Physical    Patient: Kathryn Jimenez:[id number] DOB: 16-Jan-1971 DOA: 03/16/2023 DOS: the patient was seen and examined on 03/17/2023 PCP: Arrie Senate, FNP  Patient coming from:  [facility]  Chief Complaint: Acute hypoxic respiratory failure  HPI: Kathryn Jimenez is a 52 y.o. female with medical history significant of HFpEF, SVT NSTEMI, HTN, T2DM, HLD, history of CVA, PTSD, nocturnal oxygen use, depression, borderline personality, morbid obesity BMI 50 who presents as a transfer from [facility] for acute on chronic hypoxic respiratory failure and acute on chronic diastolic CHF.   Pt reports both her and her husband has been sick since last September when they were both diagnosed with CHF. Has been in and out of the hospital frequently with CHF exacerbation and pneumonia.  Her husband also currently in ICU at [facility].  She was hospitalized at [facility] early June for CHF exacerbation and had pulmonology consult with extensive workup with for autoimmune interstitial lung disease which was all negative or within normal limits including RNP antibodies, Smith antibodies, anti-Jo1 antibodies, ACE level, SSA and SSB antibodies, ANA double-stranded DNA antibodies, ANCA antibodies, antiglomerular basement membrane antibodies, anti-CCP antibodies, complements, ANA, anticentromere, and QuantiFERON gold. Rheumatoid factor was slightly elevated at 16 however patient had no joint pains or other symptoms of inflammatory arthritis and it was felt that this diagnosis was extremely likely. Pulmonology though possibly due to asthma and allergies. She was diuresed with IV Lasix, and treated with nebs and steroids for potential asthma exacerbation with improvement. She was started on triple therapy for asthma and prednisone taper.   She was then re-admitted from 7/11-7/14 at [facility] for acute on chronic diastolic CHF and acute on chronic hypoxic respiratory failure. Required 5L from  her baseline of 2L. She was successfully treated on IV diuresis and was able to return home on 2L of O2 and 20mg  of Torsemide.   She presented back to [facility] yesterday however with persistent cough, dyspnea and hypoxia down low 80s requiring Bipap.  BNP of 6874. Hs Troponin of 127 and then 96. Has leukocytosis of 15.5, no anemia. Na of 129, K of 3.1. Glucose of 405 with no anion gap elevation. Creatinine of 1.03.   CXR read out reports worsening bilateral lung opacities consistent with edema or multifocal pneumonia.   She was treated with 60mg  Lasix yesterday and 40mg  Lasix IV today. Also given duoneb and IV magnesium. Per documentation she was able to wean off Bipap to [facility] prior to transfer here to [facility].   On my evaluation pt down to 6L via [place]. Says at home she has been dyspneic with ambulating short distances. Had increase LE edema although feels her weight has actually been decreasing. No fever. Family recently came to declutter her home which she thinks has triggered her allergies. Believes she has a mold issue at the house. She does not have any heating or AC in her home. Had had low appetite with nausea. Stopped insulin for about 3 days since she was concerned about hypoglycemia with her lack of oral intake. Drinks about four 16oz bottle of fluid daily but could be more.   Review of Systems: As mentioned in the history of present illness. All other systems reviewed and are negative. Past Medical History:  Diagnosis Date   Acute on chronic congestive heart failure (HCC) 03/16/2023   Allergy    Anginal pain (HCC)    Anxiety    Arthritis    Asthma    Bladder  disorder, other    Borderline personality disorder (HCC)    CAD (coronary artery disease) 03/16/2023   Carotid artery disease (HCC) 03/05/2017   Depression    Diabetes mellitus without complication (HCC)    GERD (gastroesophageal reflux disease)    Headache    Hyperlipidemia    Hypertension    Kidney stones     Mucous retention cyst of maxillary sinus 08/31/2021   Noted on head CT   Neuromuscular disorder (HCC)    Neuropathy   PTSD (post-traumatic stress disorder)    Shortness of breath    Sleep apnea    Stroke (HCC) 2018   Thyroid disease    nodule   Past Surgical History:  Procedure Laterality Date   CARPAL TUNNEL RELEASE     left 2015   CERVICAL FUSION Bilateral 2003   C 5-6 with titanium screws   CHOLECYSTECTOMY     COLONOSCOPY WITH PROPOFOL N/A 06/24/2014   Procedure: COLONOSCOPY WITH PROPOFOL;  Surgeon: Malissa Hippo, MD;  Location: [facility];  Service: Endoscopy;  Laterality: N/A;  cecum time in 0810    time out   0821    total time 11 minutes   DILITATION & CURRETTAGE/HYSTROSCOPY WITH HYDROTHERMAL ABLATION N/A 04/19/2021   Procedure: DILATATION & CURETTAGE/HYSTEROSCOPY WITH HYDROTHERMAL ABLATION;  Surgeon: Myna Hidalgo, DO;  Location: [facility];  Service: Gynecology;  Laterality: N/A;   INTRAUTERINE DEVICE (IUD) INSERTION N/A 04/19/2021   Procedure: INTRAUTERINE DEVICE (IUD) INSERTION;  Surgeon: Myna Hidalgo, DO;  Location: [facility];  Service: Gynecology;  Laterality: N/A;   POLYPECTOMY N/A 06/24/2014   Procedure: RECTAL POLYPECTOMY;  Surgeon: Malissa Hippo, MD;  Location: [facility];  Service: Endoscopy;  Laterality: N/A;   RIGHT/LEFT HEART CATH AND CORONARY ANGIOGRAPHY N/A 06/19/2022   Procedure: RIGHT/LEFT HEART CATH AND CORONARY ANGIOGRAPHY;  Surgeon: Tonny Bollman, MD;  Location: [facility] INVASIVE CV LAB;  Service: Cardiovascular;  Laterality: N/A;   SPINE SURGERY     URETHRAL DILATION  1981   WISDOM TOOTH EXTRACTION Bilateral 2001 and 1990s   top and bottom    Social History:  reports that she has never smoked. She has never used smokeless tobacco. She reports that she does not drink alcohol and does not use drugs.  Allergies  Allergen Reactions   Actos [Pioglitazone] Swelling   Augmentin [Amoxicillin-Pot Clavulanate] Diarrhea, Nausea Only and Other (See Comments)    Caused severe  diarrhea   Citrus Other (See Comments)    "Issues with my tongue"- the fleshy part of the fruit    Invokana [Canagliflozin] Other (See Comments)    Yeast infection   Lopid [Gemfibrozil] Other (See Comments)    Made the tongue burn   Metoprolol Other (See Comments)    Headaches    Naproxen Sodium Other (See Comments)    Patient states she "felt loopy"   Penicillins Diarrhea and Other (See Comments)    Violent diarrhea- must have Diflucan   Pineapple Other (See Comments)    "Issues with my tongue"   Statins Other (See Comments)    Joint pain   Tape Itching and Other (See Comments)    Tape, EKG leads, and Band-Aids - Itching and redness   Amlodipine Cough   Clavulanic Acid Nausea Only   Hctz [Hydrochlorothiazide] Rash   Lisinopril Cough   Morphine And Codeine Itching and Rash    Family History  Problem Relation Age of Onset   COPD Mother    Arthritis Mother    Asthma Mother  Depression Mother    Diabetes Mother    Heart disease Mother    Hyperlipidemia Mother    Hypertension Mother    Mental illness Mother    Skin cancer Mother    Lung cancer Father    Arthritis Father    Depression Father    Drug abuse Father    Early death Father    Kidney disease Father    Mental illness Father    Colon cancer Sister    Birth defects Sister    COPD Sister    Hyperlipidemia Sister    Learning disabilities Sister    Breast cancer Sister    Breast cancer Sister    Colon cancer Sister    Alcohol abuse Paternal Grandmother    Diabetes Paternal Grandmother    Alcohol abuse Paternal Grandfather     Prior to Admission medications   Medication Sig Start Date End Date Taking? Authorizing Provider  ACCU-CHEK GUIDE test strip  09/27/22   [provider]  acetaminophen (TYLENOL) 500 MG tablet Take 500 mg by mouth every 6 (six) hours as needed for mild pain or moderate pain.    [provider]  albuterol (VENTOLIN HFA) 108 (90 Base) MCG/ACT inhaler 2 PUFFS INTO THE  LUNGS EVERY 6 HOURS AS NEEDED FOR WHEEZING OR SHORTNESS OF BREATH. Strength: 108 (90 Base) MCG/ACT 12/25/22   Milian, Aleen Campi, FNP  budesonide-formoterol [facility]) 160-4.5 MCG/ACT inhaler Inhale 2 puffs into the lungs in the morning and at bedtime. 01/28/23   [provider]  buPROPion (WELLBUTRIN XL) 150 MG 24 hr tablet Take 150 mg by mouth daily. 11/27/16   [provider]  clobetasol cream (TEMOVATE) 0.05 % Apply 1 application topically 2 (two) times daily as needed (scalp psoriasis). 06/13/21   Gwenlyn Fudge, FNP  clopidogrel (PLAVIX) 75 MG tablet Take 1 tablet (75 mg total) by mouth daily. 02/22/23   Milian, Aleen Campi, FNP  Continuous Blood Gluc Sensor (DEXCOM G7 SENSOR) MISC Monitor Blood Sugars four times daily , before meals and at bedtime. 11/23/22   Dani Gobble, NP  cycloSPORINE (RESTASIS) 0.05 % ophthalmic emulsion Place 1 drop into both eyes 2 (two) times daily.    [provider]  diphenhydrAMINE (BENADRYL) 25 mg capsule Take 25 mg by mouth every 6 (six) hours as needed for allergies.    [provider]  ezetimibe (ZETIA) 10 MG tablet Take 1 tablet (10 mg total) by mouth daily. 02/26/23   Milian, Aleen Campi, FNP  fluconazole (DIFLUCAN) 150 MG tablet Take one tablet once then repeat in 3 days if needed 07/20/22   Myna Hidalgo, DO  FLUoxetine (PROZAC) 40 MG capsule Take 40 mg by mouth daily.    [provider]  furosemide (LASIX) 40 MG tablet Take 1 tablet (40 mg total) by mouth in the morning. Patient not taking: Reported on 01/29/2023 12/25/22   Arrie Senate, FNP  guaiFENesin (MUCINEX) 600 MG 12 hr tablet Take 1 tablet (600 mg total) by mouth 2 (two) times daily. Patient taking differently: Take 600 mg by mouth 2 (two) times daily as needed for to loosen phlegm or cough. 06/01/22   Daryll Drown, NP  Incontinence Supply Disposable (PREVAIL IB FULL MAT BRIEF 2XL) MISC 1 each by Does not apply route every 2 (two)  hours as needed. 10/04/20   Gwenlyn Fudge, FNP  insulin aspart (NOVOLOG FLEXPEN) 100 UNIT/ML FlexPen Inject 14-20 Units into the skin 3 (three) times daily with meals. 10/19/22  Dani Gobble, NP  insulin glargine (LANTUS SOLOSTAR) 100 UNIT/ML Solostar Pen Inject 35 Units into the skin at bedtime. 10/19/22   Dani Gobble, NP  Insulin Pen Needle (CAREFINE PEN NEEDLES) 31G X 6 MM MISC Use to inject insulin 4 times daily 09/18/22   Dani Gobble, NP  Insulin Pen Needle (GLOBAL EASE INJECT PEN NEEDLES) 31G X 5 MM MISC USE TO INJECT INSULIN 4 TIMES A DAY. 01/11/23   Dani Gobble, NP  levonorgestrel (LILETTA, 52 MG,) 20.1 MCG/DAY IUD 1 each by Intrauterine route once. Inserted 04/19/21    [provider]  LORazepam (ATIVAN) 0.5 MG tablet Take 0.25-0.5 mg by mouth daily as needed for anxiety.    [provider]  losartan (COZAAR) 50 MG tablet Take 1 tablet (50 mg total) by mouth in the morning and at bedtime. 11/16/22 11/11/23  Pricilla Riffle, MD  metoprolol succinate (TOPROL-XL) 50 MG 24 hr tablet Take 1 tablet (50 mg total) by mouth daily. Take with or immediately following a meal. 12/25/22   Milian, Aleen Campi, FNP  montelukast (SINGULAIR) 10 MG tablet take one tablet (10 MILLIGRAM dose) by mouth at bedtime. 02/26/23   Milian, Aleen Campi, FNP  nystatin (MYCOSTATIN/NYSTOP) powder Apply 1 application  topically daily as needed (for fungal infections).    [provider]  OVER THE COUNTER MEDICATION Take 1-2 drops by mouth daily as needed (Thyroid Function). Potassium Iodide 4 %; Iodine 2 %; Distilled water    [provider]  oxybutynin (DITROPAN-XL) 10 MG 24 hr tablet Take 1 tablet (10 mg total) by mouth at bedtime. 12/25/22   Milian, Aleen Campi, FNP  OXYGEN Inhale 4 L/min into the lungs continuous.    [provider]  potassium chloride (KLOR-CON) 10 MEQ tablet Take 2 tablets (20 mEq total) by mouth daily. 03/21/22   Gwenlyn Fudge, FNP   terconazole (TERAZOL 3) 0.8 % vaginal cream Place 1 applicator vaginally at bedtime. 07/02/22   Daryll Drown, NP  torsemide (DEMADEX) 20 MG tablet Take 1 tablet (20 mg total) by mouth once for 1 dose. Can take extra tablet with weight gain greater than 3 lb in 24 hours 02/22/23 02/22/23  Arrie Senate, FNP  umeclidinium bromide (INCRUSE ELLIPTA) 62.5 MCG/ACT AEPB Inhale 1 puff into the lungs daily. 01/28/23   [provider]  valACYclovir (VALTREX) 500 MG tablet Take 1 tablet (500 mg total) by mouth daily. 07/09/22   Myna Hidalgo, DO    Physical Exam: Vitals:   03/16/23 2126 03/16/23 2129 03/17/23 0004  BP: (!) 140/77  137/73  Pulse: 88  96  Resp: 20  16  Temp: 98.4 F (36.9 C)  (!) 97.4 F (36.3 C)  TempSrc: Oral  Oral  SpO2: 96% 95% 96%  Weight: 124.1 kg    Height: 5\' 6"  (1.676 m)     Constitutional: NAD, calm, comfortable, morbidly obese female sitting up at the side of bed Eyes:  lids and conjunctivae normal ENMT: Mucous membranes are moist.  Neck: normal, supple Respiratory: clear to auscultation bilaterally, no wheezing, no crackles. Normal respiratory effort. No accessory muscle use.  Able to speak in full sentences.  On 6 L via nasal cannula. Cardiovascular: Regular rate and rhythm, no murmurs / rubs / gallops.  Trace bilateral lower extremity edema.   Abdomen: no tenderness Musculoskeletal: no clubbing / cyanosis. No joint deformity upper and lower extremities. Normal muscle tone.  Skin: no rashes, lesions, ulcers. No induration Neurologic: CN  2-12 grossly intact.   Psychiatric: Normal judgment and insight. Alert and oriented x 3. Normal mood. Data Reviewed:  SEE HPI  Assessment and Plan: * Acute on chronic congestive heart failure (HCC) -documented recent transthoracic echocardiogram which showed a left ventricular ejection fraction 65 to 70% with normal wall motion and normal diastolic function. There were no major valvular pathologies and RSVP  was normal -on 20mg  Torsemide at home.  -Daily IV Lasix -Follow intake, output and daily weights. 2L fluid restriction. -Pt actually does not appear clinically overload and report that she has actually been losing weight.  Questions if CHF and respiratory failure could be exacerbated by environmental factors.Pt reports poor living conditions with no AC or heating. Family recently had to toss many things out of her home to make more room and has possible mold issue. Had documented CTA chest in June showing pneumonitis vs pneumonia. CXR with possible pneumonia but symptoms more align with CHF so will hold antibiotics.   Morbid obesity with BMI of 50.0-59.9, adult (HCC) -noted  Primary hypertension -continue losartan   T2DM (type 2 diabetes mellitus) (HCC) -A1C 9.8 two weeks ago -Sliding scale insulin   Acute on chronic hypoxic respiratory failure (HCC) -Uses oxygen as needed at home up to 2 L but required Bipap and [facility] Now weaned to [facility] -Continue to wean as tolerated with IV diuresis -Had documented CTA chest in June showing pneumonitis vs pneumonia. CXR with possible pneumonia but symptoms more align with CHF so will hold antibiotics. Low suspicion for PE. -had pulmonology consult with extensive workup with for autoimmune interstitial lung disease recently which was all negative or within normal limits including RNP antibodies, Smith antibodies, anti-Jo1 antibodies, ACE level, SSA and SSB antibodies, ANA double-stranded DNA antibodies, ANCA antibodies, antiglomerular basement membrane antibodies, anti-CCP antibodies, complements, ANA, anticentromere, and QuantiFERON gold. Rheumatoid factor was slightly elevated at 16 however patient had no joint pains or other symptoms of inflammatory arthritis and it was felt that this diagnosis was extremely likely.  -needs PFT outpatient   Hypokalemia -K of 3.1 -will keep on daily oral potassium while being diuresis -follow BMP   CAD  (coronary artery disease) - Continue Plavix  PTSD (post-traumatic stress disorder) Depression -continue bupropion      Advance Care Planning:   Code Status: Full Code   Consults: None  Family Communication: None at bedside  Severity of Illness: The appropriate patient status for this patient is INPATIENT. Inpatient status is judged to be reasonable and necessary in order to provide the required intensity of service to ensure the patient's safety. The patient's presenting symptoms, physical exam findings, and initial radiographic and laboratory data in the context of their chronic comorbidities is felt to place them at high risk for further clinical deterioration. Furthermore, it is not anticipated that the patient will be medically stable for discharge from the hospital within 2 midnights of admission.   * I certify that at the point of admission it is my clinical judgment that the patient will require inpatient hospital care spanning beyond 2 midnights from the point of admission due to high intensity of service, high risk for further deterioration and high frequency of surveillance required.*  Author: Anselm Jungling, DO 03/17/2023 2:48 AM  For on call review [email or web address].

## 2023-03-17 DIAGNOSIS — I5033 Acute on chronic diastolic (congestive) heart failure: Secondary | ICD-10-CM | POA: Diagnosis not present

## 2023-03-17 DIAGNOSIS — E876 Hypokalemia: Secondary | ICD-10-CM

## 2023-03-17 DIAGNOSIS — F331 Major depressive disorder, recurrent, moderate: Secondary | ICD-10-CM

## 2023-03-17 DIAGNOSIS — Z6841 Body Mass Index (BMI) 40.0 and over, adult: Secondary | ICD-10-CM

## 2023-03-17 DIAGNOSIS — I10 Essential (primary) hypertension: Secondary | ICD-10-CM | POA: Diagnosis not present

## 2023-03-17 DIAGNOSIS — J9621 Acute and chronic respiratory failure with hypoxia: Secondary | ICD-10-CM

## 2023-03-17 DIAGNOSIS — E1142 Type 2 diabetes mellitus with diabetic polyneuropathy: Secondary | ICD-10-CM | POA: Diagnosis not present

## 2023-03-17 DIAGNOSIS — I2510 Atherosclerotic heart disease of native coronary artery without angina pectoris: Secondary | ICD-10-CM

## 2023-03-17 DIAGNOSIS — Z794 Long term (current) use of insulin: Secondary | ICD-10-CM

## 2023-03-17 DIAGNOSIS — I2583 Coronary atherosclerosis due to lipid rich plaque: Secondary | ICD-10-CM

## 2023-03-17 DIAGNOSIS — F4310 Post-traumatic stress disorder, unspecified: Secondary | ICD-10-CM

## 2023-03-17 LAB — CBC
HCT: 39.9 % (ref 36.0–46.0)
HCT: 42.8 % (ref 36.0–46.0)
Hemoglobin: 13.2 g/dL (ref 12.0–15.0)
Hemoglobin: 13.9 g/dL (ref 12.0–15.0)
MCH: 27.6 pg (ref 26.0–34.0)
MCH: 27.7 pg (ref 26.0–34.0)
MCHC: 33.1 g/dL (ref 30.0–36.0)
MCHC: 32.5 g/dL (ref 30.0–36.0)
MCV: 83.5 fL (ref 80.0–100.0)
MCV: 85.4 fL (ref 80.0–100.0)
Platelets: 532 10*3/uL — ABNORMAL HIGH (ref 150–400)
Platelets: 578 10*3/uL — ABNORMAL HIGH (ref 150–400)
RBC: 4.78 MIL/uL (ref 3.87–5.11)
RBC: 5.01 MIL/uL (ref 3.87–5.11)
RDW: 15.3 % (ref 11.5–15.5)
RDW: 15.4 % (ref 11.5–15.5)
WBC: 13.1 10*3/uL — ABNORMAL HIGH (ref 4.0–10.5)
WBC: 12.6 10*3/uL — ABNORMAL HIGH (ref 4.0–10.5)
nRBC: 0 % (ref 0.0–0.2)
nRBC: 0 % (ref 0.0–0.2)

## 2023-03-17 LAB — BASIC METABOLIC PANEL WITH GFR
Anion gap: 12 (ref 5–15)
Anion gap: 11 (ref 5–15)
BUN: 19 mg/dL (ref 6–20)
BUN: 18 mg/dL (ref 6–20)
CO2: 30 mmol/L (ref 22–32)
CO2: 32 mmol/L (ref 22–32)
Calcium: 8.9 mg/dL (ref 8.9–10.3)
Calcium: 9.2 mg/dL (ref 8.9–10.3)
Chloride: 87 mmol/L — ABNORMAL LOW (ref 98–111)
Chloride: 89 mmol/L — ABNORMAL LOW (ref 98–111)
Creatinine, Ser: 0.96 mg/dL (ref 0.44–1.00)
Creatinine, Ser: 0.92 mg/dL (ref 0.44–1.00)
GFR, Estimated: >60 mL/min (ref >60)
GFR, Estimated: >60 mL/min (ref >60)
Glucose, Bld: 381 mg/dL — ABNORMAL HIGH (ref 70–99)
Glucose, Bld: 288 mg/dL — ABNORMAL HIGH (ref 70–99)
Potassium: 2.7 mmol/L — CL (ref 3.5–5.1)
Potassium: 3.6 mmol/L (ref 3.5–5.1)
Sodium: 129 mmol/L — ABNORMAL LOW (ref 135–145)
Sodium: 132 mmol/L — ABNORMAL LOW (ref 135–145)

## 2023-03-17 LAB — GLUCOSE, CAPILLARY
Glucose-Capillary: 308 mg/dL — ABNORMAL HIGH (ref 70–99)
Glucose-Capillary: 281 mg/dL — ABNORMAL HIGH (ref 70–99)
Glucose-Capillary: 226 mg/dL — ABNORMAL HIGH (ref 70–99)
Glucose-Capillary: 270 mg/dL — ABNORMAL HIGH (ref 70–99)

## 2023-03-17 MED ORDER — UMECLIDINIUM BROMIDE 62.5 MCG/ACT IN AEPB
1.0000 | INHALATION_SPRAY | Freq: Every day | RESPIRATORY_TRACT | Status: DC
  Administered 2023-03-17 – 2023-03-19 (×3): 1 via RESPIRATORY_TRACT
  Filled 2023-03-17: qty 7

## 2023-03-17 MED ORDER — POTASSIUM CHLORIDE 10 MEQ/100ML IV SOLN
10.0000 meq | INTRAVENOUS | Status: AC
  Administered 2023-03-17 (×3): 10 meq via INTRAVENOUS
  Filled 2023-03-17 (×3): qty 100

## 2023-03-17 MED ORDER — EZETIMIBE 10 MG PO TABS
10.0000 mg | ORAL_TABLET | Freq: Every day | ORAL | Status: DC
  Administered 2023-03-17 – 2023-03-19 (×3): 10 mg via ORAL
  Filled 2023-03-17 (×3): qty 1

## 2023-03-17 MED ORDER — CLOPIDOGREL BISULFATE 75 MG PO TABS
75.0000 mg | ORAL_TABLET | Freq: Every day | ORAL | Status: DC
  Administered 2023-03-17 – 2023-03-19 (×3): 75 mg via ORAL
  Filled 2023-03-17 (×3): qty 1

## 2023-03-17 MED ORDER — LOSARTAN POTASSIUM 50 MG PO TABS
100.0000 mg | ORAL_TABLET | Freq: Every day | ORAL | Status: DC
  Administered 2023-03-17 – 2023-03-19 (×3): 100 mg via ORAL
  Filled 2023-03-17 (×3): qty 2

## 2023-03-17 MED ORDER — POTASSIUM CHLORIDE CRYS ER 20 MEQ PO TBCR
40.0000 meq | EXTENDED_RELEASE_TABLET | Freq: Every day | ORAL | Status: DC
  Administered 2023-03-17: 40 meq via ORAL
  Filled 2023-03-17: qty 2

## 2023-03-17 MED ORDER — LIVING BETTER WITH HEART FAILURE BOOK: Freq: Once | Status: AC

## 2023-03-17 MED ORDER — MONTELUKAST SODIUM 10 MG PO TABS
10.0000 mg | ORAL_TABLET | Freq: Every day | ORAL | Status: DC
  Administered 2023-03-17 – 2023-03-18 (×2): 10 mg via ORAL
  Filled 2023-03-17 (×2): qty 1

## 2023-03-17 MED ORDER — MOMETASONE FURO-FORMOTEROL FUM 200-5 MCG/ACT IN AERO
2.0000 | INHALATION_SPRAY | Freq: Two times a day (BID) | RESPIRATORY_TRACT | Status: DC
  Administered 2023-03-17 – 2023-03-19 (×5): 2 via RESPIRATORY_TRACT
  Filled 2023-03-17: qty 8.8

## 2023-03-17 MED ORDER — OXYBUTYNIN CHLORIDE ER 10 MG PO TB24
10.0000 mg | ORAL_TABLET | Freq: Every day | ORAL | Status: DC
  Administered 2023-03-17 – 2023-03-18 (×2): 10 mg via ORAL
  Filled 2023-03-17 (×3): qty 1

## 2023-03-17 MED ORDER — IPRATROPIUM-ALBUTEROL 0.5-2.5 (3) MG/3ML IN SOLN: 3.0000 mL | RESPIRATORY_TRACT | Status: DC | PRN

## 2023-03-17 MED ORDER — FUROSEMIDE 10 MG/ML IJ SOLN
40.0000 mg | Freq: Every day | INTRAMUSCULAR | Status: DC
  Administered 2023-03-17 – 2023-03-19 (×3): 40 mg via INTRAVENOUS
  Filled 2023-03-17 (×3): qty 4

## 2023-03-17 NOTE — Assessment & Plan Note (Signed)
Continue Plavix.  ?

## 2023-03-17 NOTE — Assessment & Plan Note (Addendum)
-  documented recent transthoracic echocardiogram which showed a left ventricular ejection fraction 65 to 70% with normal wall motion and normal diastolic function. There were no major valvular pathologies and RSVP was normal -on 20mg  Torsemide at home.  -Daily IV Lasix -Follow intake, output and daily weights. 2L fluid restriction. -Pt actually does not appear clinically overload and report that she has actually been losing weight.  Questions if CHF and respiratory failure could be exacerbated by environmental factors.Pt reports poor living conditions with no AC or heating. Family recently had to toss many things out of her home to make more room and has possible mold issue. Had documented CTA chest in June showing pneumonitis vs pneumonia. CXR with possible pneumonia but symptoms more align with CHF so will hold antibiotics.

## 2023-03-17 NOTE — Plan of Care (Signed)

## 2023-03-17 NOTE — Progress Notes (Signed)
PROGRESS NOTE    Kathryn Jimenez  OZH:[id number] DOB: 05-17-1971 DOA: 03/16/2023 PCP: Arrie Senate, FNP    Brief Narrative:  Kathryn Jimenez is a 52 y.o. female with medical history significant of HFpEF, SVT, NSTEMI, HTN, T2DM, HLD, history of CVA, PTSD, nocturnal oxygen use, depression, borderline personality, morbid obesity with BMI 50 presented to [facility] with cough and dyspnea and hypoxia with lower extremity edema.  Requiring BiPAP.  Initial pulse ox was in the 80s.  Patient's family at bedside as well.  Of note, she was hospitalized at [facility] early June for CHF exacerbation and had pulmonology consult with extensive workup with for autoimmune interstitial lung disease which was all negative. Rheumatoid factor was slightly elevated at 16 however patient had no joint pains or other symptoms of inflammatory arthritis and it was felt that this diagnosis was extremely likely.  She was diuresed with IV Lasix, and treated with nebs and steroids for potential asthma exacerbation with improvement. She was started on triple therapy for asthma and prednisone taper.  She was then re-admitted from 7/11-7/14 at [facility] for acute on chronic diastolic CHF and acute on chronic hypoxic respiratory failure. Required 5L from her baseline of 2L. She was successfully treated on IV diuresis and was able to return home on 2L of O2 and 20mg  of Torsemide.  This presentation CXR with worsening bilateral lung opacities consistent with edema or multifocal pneumonia. Was able to wean off Bipap to HFNC prior to transfer here to [facility]. .  Patient believes she has a mold issue at the house. She does not have any heating or AC in her home.    Assessment and Plan:  * Acute on chronic congestive heart failure (HCC) Recent transthoracic echo which showed a left ventricular ejection fraction 65 to 70% with normal wall motion and normal diastolic function. RSVP was normal.On 20mg  Torsemide at home.   Continue IV Lasix, strict intake and output charting, fluid restriction.  Does not appear to be overtly volume overloaded given. Question environmental factors including mold concern exacerbating symptoms. CTA chest in June showing pneumonitis vs pneumonia. CXR with possible pneumonia but symptoms more align with CHF so will hold antibiotics.  Currently on 6 L of oxygen by nasal cannula.  Will continue to wean oxygen as able.  Essential hypertension -continue losartan.  Continue to monitor blood pressure. BP is stable.   T2DM (type 2 diabetes mellitus) with hyperglycemia -Hemoglobin A1C 9.8 two weeks ago.  Continue sliding scale insulin, acuchecks, diabetic diet. Closely monitor.  Acute on chronic hypoxic respiratory failure (HCC) On 2 L of oxygen at baseline at home.  Currently on 6L/min. Was initially on BiPAP at outside hospital currently on nasal cannula oxygen.   CTA chest in June showing pneumonitis vs pneumonia.  Findings more consistent with congestive heart failure at this time.  Antibiotics on hold.  Patient did have previous workup for interstitial lung disease at [facility] and was negative at that time. Rheumatoid factor was slightly elevated at 16 however patient had no joint pains or other symptoms of inflammatory arthritis and it was felt that this diagnosis was extremely likely.  Will need to have pulmonary follow-up as outpatient.   Hypokalemia Improved after replacement.  Latest potassium of 3.6.  Initial potassium of 2.7. check BMP in am.  Hyponatremia.  Sodium level of 132 from initial 129.  Will continue to monitor.  On diuretics.  CAD (coronary artery disease) Continue Plavix.  No chest pain  PTSD (post-traumatic stress disorder) Depression  -continue bupropion  Mild leukocytosis.  Likely reactive.  Trending down.  No obvious source of infection.   Morbid obesity with BMI of 50.0-59.9, adult (HCC) Would benefit from ongoing weight loss as outpatient.    DVT  prophylaxis: enoxaparin (LOVENOX) injection 40 mg Start: 03/16/23 2200   Code Status:     Code Status: Full Code  Disposition: Home likely in 1 to 2 days Status is: Inpatient Remains inpatient appropriate because: Congestive heart failure, respiratory failure,   Family Communication: None at bedside  Consultants:  None  Procedures:  None  Antimicrobials:  None  Anti-infectives (From admission, onward)    None        Subjective: Today, patient was seen and examined at bedside.  Complains of shortness of breath, dyspnea.  Little better with her shortness of breath.  Denies any fever, chills or rigor.  Has mild cough with clear phlegm production and chest discomfort while coughing.  Objective: Vitals:   03/17/23 0500 03/17/23 0725 03/17/23 0815 03/17/23 0816  BP:  137/72    Pulse:      Resp:  15    Temp:  97.8 F (36.6 C)    TempSrc:  Oral    SpO2:   94% (!) 88%  Weight: 124.2 kg     Height:        Intake/Output Summary (Last 24 hours) at 03/17/2023 1403 Last data filed at 03/17/2023 0653 Gross per 24 hour  Intake 314.38 ml  Output --  Net 314.38 ml   Filed Weights   03/16/23 2126 03/17/23 0500  Weight: 124.1 kg 124.2 kg    Physical Examination: Body mass index is 44.18 kg/m.  General: Obese built, not in obvious distress, on nasal cannula oxygen at 6 L/min HENT:   No scleral pallor or icterus noted. Oral mucosa is moist.  Chest:    Diminished breath sounds bilaterally.  Coarse breathing noted CVS: S1 &S2 heard. No murmur.  Regular rate and rhythm. Abdomen: Soft, nontender, nondistended.  Bowel sounds are heard.   Extremities: No cyanosis, clubbing with peripheral trace edema peripheral pulses are palpable. Psych: Alert, awake and oriented, normal mood CNS:  No cranial nerve deficits.  Power equal in all extremities.   Skin: Warm and dry.  No rashes noted.  Data Reviewed:   CBC: Recent Labs  Lab 03/17/23 0043 03/17/23 0635  WBC 13.1* 12.6*   HGB 13.2 13.9  HCT 39.9 42.8  MCV 83.5 85.4  PLT 532* 578*    Basic Metabolic Panel: Recent Labs  Lab 03/17/23 0043 03/17/23 0635  NA 129* 132*  K 2.7* 3.6  CL 87* 89*  CO2 30 32  GLUCOSE 381* 288*  BUN 19 18  CREATININE 0.96 0.92  CALCIUM 8.9 9.2    Liver Function Tests: No results for input(s): "AST", "ALT", "ALKPHOS", "BILITOT", "PROT", "ALBUMIN" in the last 168 hours.   Radiology Studies: No results found.    LOS: 1 day    Joycelyn Das, MD [facility] Available via Epic secure chat 7am-7pm After these hours, please refer to coverage provider listed on [email or web address] 03/17/2023, 2:03 PM

## 2023-03-17 NOTE — Plan of Care (Signed)

## 2023-03-17 NOTE — Hospital Course (Addendum)
Kathryn Jimenez is a 52 y.o. female with medical history significant of HFpEF, SVT, NSTEMI, HTN, T2DM, HLD, history of CVA, PTSD, nocturnal oxygen use, depression, borderline personality, morbid obesity with BMI 50 presented to [facility] with cough and dyspnea and hypoxia with lower extremity edema.  Requiring BiPAP.  Initial pulse ox was in the 80s.  Patient's family at bedside as well.  Of note, she was hospitalized at [facility] early June for CHF exacerbation and had pulmonology consult with extensive workup with for autoimmune interstitial lung disease which was all negative. Rheumatoid factor was slightly elevated at 16 however patient had no joint pains or other symptoms of inflammatory arthritis and it was felt that this diagnosis was extremely likely.  She was diuresed with IV Lasix, and treated with nebs and steroids for potential asthma exacerbation with improvement. She was started on triple therapy for asthma and prednisone taper.  She was then re-admitted from 7/11-7/14 at [facility] for acute on chronic diastolic CHF and acute on chronic hypoxic respiratory failure. Required 5L from her baseline of 2L. She was successfully treated on IV diuresis and was able to return home on 2L of O2 and 20mg  of Torsemide.  This presentation CXR with worsening bilateral lung opacities consistent with edema or multifocal pneumonia. Was able to wean off Bipap to HFNC prior to transfer here to [facility]. .  Patient believes she has a mold issue at the house. She does not have any heating or AC in her home.    Assessment and Plan:  * Acute on chronic congestive heart failure (HCC) Recent transthoracic echo which showed a left ventricular ejection fraction 65 to 70% with normal wall motion and normal diastolic function. RSVP was normal.On 20mg  Torsemide at home.  Continue IV Lasix, strict intake and output charting, fluid restriction.  Does not appear to be overtly volume overloaded given. Question  environmental factors including mold concern exacerbating symptoms. CTA chest in June showing pneumonitis vs pneumonia. CXR with possible pneumonia but symptoms more align with CHF so will hold antibiotics.  Currently on 6 L of oxygen by nasal cannula.  Essential hypertension -continue losartan.  Continue to monitor blood pressure. BP is stable.   T2DM (type 2 diabetes mellitus) with hyperglycemia -Hemoglobin A1C 9.8 two weeks ago.  Continue sliding scale insulin, acuchecks, diabetic diet. Closely monitor.  Acute on chronic hypoxic respiratory failure (HCC) On 2 L of oxygen at baseline at home.  Currently on 6L/min. Was initially on BiPAP at outside hospital currently on nasal cannula oxygen.   CTA chest in June showing pneumonitis vs pneumonia.  Findings more consistent with congestive heart failure at this time.  Antibiotics on hold.  Patient did have previous workup for interstitial lung disease at [facility] and was negative at that time. Rheumatoid factor was slightly elevated at 16 however patient had no joint pains or other symptoms of inflammatory arthritis and it was felt that this diagnosis was extremely likely.  Will need to have pulmonary follow-up as outpatient.   Hypokalemia Improved after replacement.  Latest potassium of 3.6.  Initial potassium of 2.7. check BMP in am.  Hyponatremia.  Sodium level of 132 from initial 129.  Will continue to monitor.  On diuretics.  CAD (coronary artery disease) Continue Plavix.  No chest pain   PTSD (post-traumatic stress disorder) Depression  -continue bupropion  Mild leukocytosis.  Likely reactive.  Trending down.  No obvious source of infection.   Morbid obesity with BMI of 50.0-59.9, adult (  HCC) Would benefit from ongoing weight loss as outpatient.

## 2023-03-17 NOTE — Plan of Care (Signed)

## 2023-03-18 DIAGNOSIS — E1142 Type 2 diabetes mellitus with diabetic polyneuropathy: Secondary | ICD-10-CM | POA: Diagnosis not present

## 2023-03-18 DIAGNOSIS — I5033 Acute on chronic diastolic (congestive) heart failure: Secondary | ICD-10-CM | POA: Diagnosis not present

## 2023-03-18 DIAGNOSIS — I10 Essential (primary) hypertension: Secondary | ICD-10-CM | POA: Diagnosis not present

## 2023-03-18 LAB — GLUCOSE, CAPILLARY
Glucose-Capillary: 296 mg/dL — ABNORMAL HIGH (ref 70–99)
Glucose-Capillary: 343 mg/dL — ABNORMAL HIGH (ref 70–99)
Glucose-Capillary: 308 mg/dL — ABNORMAL HIGH (ref 70–99)
Glucose-Capillary: 336 mg/dL — ABNORMAL HIGH (ref 70–99)

## 2023-03-18 MED ORDER — FLUOXETINE HCL 20 MG PO CAPS
40.0000 mg | ORAL_CAPSULE | Freq: Every day | ORAL | Status: DC
  Administered 2023-03-18 – 2023-03-19 (×2): 40 mg via ORAL
  Filled 2023-03-18 (×2): qty 2

## 2023-03-18 MED ORDER — GUAIFENESIN ER 600 MG PO TB12
600.0000 mg | ORAL_TABLET | Freq: Two times a day (BID) | ORAL | Status: DC
  Administered 2023-03-18 – 2023-03-19 (×3): 600 mg via ORAL
  Filled 2023-03-18 (×3): qty 1

## 2023-03-18 MED ORDER — VALACYCLOVIR HCL 500 MG PO TABS
500.0000 mg | ORAL_TABLET | Freq: Every day | ORAL | Status: DC
  Administered 2023-03-18 – 2023-03-19 (×2): 500 mg via ORAL
  Filled 2023-03-18 (×2): qty 1

## 2023-03-18 MED ORDER — CYCLOSPORINE 0.05 % OP EMUL
1.0000 [drp] | Freq: Two times a day (BID) | OPHTHALMIC | Status: DC
  Administered 2023-03-18 – 2023-03-19 (×3): 1 [drp] via OPHTHALMIC
  Filled 2023-03-18 (×4): qty 30

## 2023-03-18 MED ORDER — POTASSIUM CHLORIDE CRYS ER 20 MEQ PO TBCR
40.0000 meq | EXTENDED_RELEASE_TABLET | ORAL | Status: AC
  Administered 2023-03-18 (×2): 40 meq via ORAL
  Filled 2023-03-18 (×2): qty 2

## 2023-03-18 MED ORDER — ACETAMINOPHEN 500 MG PO TABS: 500.0000 mg | ORAL_TABLET | Freq: Four times a day (QID) | ORAL | Status: DC | PRN

## 2023-03-18 MED ORDER — LORATADINE 10 MG PO TABS
10.0000 mg | ORAL_TABLET | Freq: Every day | ORAL | Status: DC
  Administered 2023-03-18 – 2023-03-19 (×2): 10 mg via ORAL
  Filled 2023-03-18 (×2): qty 1

## 2023-03-18 MED ORDER — INSULIN ASPART 100 UNIT/ML FLEXPEN
5.0000 [IU] | PEN_INJECTOR | Freq: Three times a day (TID) | SUBCUTANEOUS | Status: DC
  Filled 2023-03-18: qty 3

## 2023-03-18 MED ORDER — POTASSIUM CHLORIDE CRYS ER 20 MEQ PO TBCR
40.0000 meq | EXTENDED_RELEASE_TABLET | Freq: Every day | ORAL | Status: DC
  Administered 2023-03-19: 40 meq via ORAL
  Filled 2023-03-18: qty 2

## 2023-03-18 MED ORDER — ALBUTEROL SULFATE (2.5 MG/3ML) 0.083% IN NEBU: 2.5000 mg | INHALATION_SOLUTION | RESPIRATORY_TRACT | Status: DC | PRN

## 2023-03-18 MED ORDER — LORAZEPAM 0.5 MG PO TABS
0.2500 mg | ORAL_TABLET | Freq: Every day | ORAL | Status: DC | PRN
  Administered 2023-03-18: 0.5 mg via ORAL
  Filled 2023-03-18: qty 1

## 2023-03-18 MED ORDER — MAGNESIUM OXIDE -MG SUPPLEMENT 400 (240 MG) MG PO TABS
400.0000 mg | ORAL_TABLET | Freq: Two times a day (BID) | ORAL | Status: DC
  Administered 2023-03-18 – 2023-03-19 (×3): 400 mg via ORAL
  Filled 2023-03-18 (×3): qty 1

## 2023-03-18 MED ORDER — INSULIN ASPART 100 UNIT/ML IJ SOLN
5.0000 [IU] | Freq: Three times a day (TID) | INTRAMUSCULAR | Status: DC
  Administered 2023-03-19: 5 [IU] via SUBCUTANEOUS

## 2023-03-18 MED ORDER — BUPROPION HCL ER (XL) 150 MG PO TB24
150.0000 mg | ORAL_TABLET | Freq: Every day | ORAL | Status: DC
  Administered 2023-03-18 – 2023-03-19 (×2): 150 mg via ORAL
  Filled 2023-03-18 (×2): qty 1

## 2023-03-18 MED ORDER — INSULIN GLARGINE-YFGN 100 UNIT/ML ~~LOC~~ SOLN
35.0000 [IU] | Freq: Every day | SUBCUTANEOUS | Status: DC
  Administered 2023-03-18: 35 [IU] via SUBCUTANEOUS
  Filled 2023-03-18 (×2): qty 0.35

## 2023-03-18 NOTE — Progress Notes (Signed)
Pt stated she has wound on right foot - third toe - wound noted to be dry and scabbed with foul odor.  MD notified.  Wound RN consulted.

## 2023-03-18 NOTE — TOC Initial Note (Signed)
Transition of Care [facility]) - Initial/Assessment Note    Patient Details  Name: Kathryn Jimenez MRN: [id number] Date of Birth: 08-23-1970  Transition of Care [facility]) CM/SW Contact:    Gala Lewandowsky, RN Phone Number: 03/18/2023, 11:47 AM  Clinical Narrative: Patient presented for shortness of breath-acute hypoxic respiratory failure. PTA patient was from home with spouse. Patient states she has DME oxygen via [employer] @ 2 Liters. Patient reports her Medicaid just changed and she just had to change DME companies. Patient reports that she has a Case Manager with [employer] that was assisting with transportation. Case Manager did speak with the CSW to provide resources for transportation needs. Case Manager will continue to follow for transition of care needs as the patient progresses.               Expected Discharge Plan: Home/Self Care Barriers to Discharge: Continued Medical Work up   Patient Goals and CMS Choice Patient states their goals for this hospitalization and ongoing recovery are:: plan to return home  Expected Discharge Plan and Services   Discharge Planning Services: CM Consult   Living arrangements for the past 2 months: Single Family Home   Prior Living Arrangements/Services Living arrangements for the past 2 months: Single Family Home Lives with:: Spouse Patient language and need for interpreter reviewed:: Yes Do you feel safe going back to the place where you live?: Yes      Need for Family Participation in Patient Care: Yes (Comment) Care giver support system in place?: Yes (comment) Current home services: DME (Oxygen via [employer] 2 LIters.) Criminal Activity/Legal Involvement Pertinent to Current Situation/Hospitalization: No - Comment as needed  Activities of Daily Living Home Assistive Devices/Equipment: Cane (specify quad or straight) ADL Screening (condition at time of admission) Patient's cognitive ability adequate to safely complete daily activities?:  Yes Is the patient deaf or have difficulty hearing?: No Does the patient have difficulty seeing, even when wearing glasses/contacts?: No Does the patient have difficulty concentrating, remembering, or making decisions?: No Patient able to express need for assistance with ADLs?: No Does the patient have difficulty dressing or bathing?: No Independently performs ADLs?: Yes (appropriate for developmental age) Does the patient have difficulty walking or climbing stairs?: Yes Weakness of Legs: Both Weakness of Arms/Hands: None  Permission Sought/Granted Permission sought to share information with : Family Supports, Case Manager  Emotional Assessment Appearance:: Appears stated age Attitude/Demeanor/Rapport: Engaged Affect (typically observed): Appropriate Orientation: : Oriented to Self, Oriented to Place, Oriented to  Time, Oriented to Situation Alcohol / Substance Use: Not Applicable Psych Involvement: No (comment)  Admission diagnosis:  Acute on chronic congestive heart failure (HCC) [I50.9] Patient Active Problem List   Diagnosis Date Noted   Acute on chronic congestive heart failure (HCC) 03/16/2023   Acute on chronic hypoxic respiratory failure (HCC) 03/16/2023   CAD (coronary artery disease) 03/16/2023   Hypokalemia 03/16/2023   Demand ischemia of myocardium 06/18/2022   Chronic heart failure with preserved ejection fraction (HFpEF) (HCC) 06/18/2022   SVT (supraventricular tachycardia) 06/18/2022   OSA (obstructive sleep apnea) 06/18/2022   Vitamin B12 deficiency 05/07/2021   Goiter 09/12/2020   Hereditary and idiopathic neuropathy, unspecified 09/12/2020   Nontoxic single thyroid nodule 09/12/2020   Presence of insulin pump (external) (internal) 09/12/2020   Morbid obesity with BMI of 50.0-59.9, adult (HCC) 01/12/2020   Urinary incontinence 01/12/2020   Labia enlarged 05/29/2018   Abdominal pannus 05/29/2018   Small Jimenez disease, cerebrovascular 09/30/2017   Diabetic  polyneuropathy associated  with type 2 diabetes mellitus (HCC) 07/01/2017   Vitamin D deficiency 07/01/2017   Depression 01/11/2015   PTSD (post-traumatic stress disorder) 01/11/2015   T2DM (type 2 diabetes mellitus) (HCC) 01/11/2015   Primary hypertension 01/11/2015   High cholesterol 01/11/2015   Edema 01/11/2015   PCP:  Arrie Senate, FNP Pharmacy:   [facility] [place], [place] - [address] [place] [address] Phone: [phone] Fax: [phone] Social Determinants of Health (SDOH) Social History: SDOH Screenings   Food Insecurity: No Food Insecurity (03/16/2023)  Recent Concern: Food Insecurity - Food Insecurity Present (01/23/2023)   Received from [facility]  Housing: Low Risk  (03/16/2023)  Transportation Needs: Unmet Transportation Needs (03/16/2023)  Utilities: Not At Risk (03/16/2023)  Alcohol Screen: Low Risk  (07/20/2022)  Depression (PHQ2-9): Low Risk  (03/16/2023)  Financial Resource Strain: Low Risk  (03/16/2023)  Physical Activity: Inactive (03/16/2023)  Social Connections: Socially Isolated (03/16/2023)  Stress: Stress Concern Present (03/16/2023)  Tobacco Use: Low Risk  (03/15/2023)   Received from [facility]  Health Literacy: Adequate Health Literacy (03/16/2023)   Readmission Risk Interventions     No data to display

## 2023-03-18 NOTE — Progress Notes (Signed)
PROGRESS NOTE    Kathryn Jimenez  ZOX:[id number] DOB: October 15, 1970 DOA: 03/16/2023 PCP: Arrie Senate, FNP    Brief Narrative:   Kathryn Jimenez is a 52 y.o. female with medical history significant of HFpEF, SVT, NSTEMI, HTN, T2DM, HLD, history of CVA, PTSD, nocturnal oxygen use, depression, borderline personality, morbid obesity with BMI 50 presented to [facility] with cough and dyspnea and hypoxia with lower extremity edema.  Requiring BiPAP.  Initial pulse ox was in the 80s. Of note, she was hospitalized at [facility] early June for CHF exacerbation and had pulmonology consult with extensive workup with for autoimmune interstitial lung disease which was all negative. Rheumatoid factor was slightly elevated at 16 however patient had no joint pains or other symptoms of inflammatory arthritis and it was felt that this diagnosis was extremely likely.  She was diuresed with IV Lasix, and treated with nebs and steroids for potential asthma exacerbation with improvement. She was started on triple therapy for asthma and prednisone taper.  She was then re-admitted from 7/11-7/14 at [facility] for acute on chronic diastolic CHF and acute on chronic hypoxic respiratory failure. Required 5L from her baseline of 2L. She was successfully treated on IV diuresis and was able to return home on 2L of O2 and 20mg  of Torsemide.  On this presentation CXR  showed worsening bilateral lung opacities consistent with edema or multifocal pneumonia. Was able to wean off Bipap to HFNC prior to transfer here to [facility]. .  Patient believes she has a mold issue at the house. She does not have any heating or AC in her home.    Assessment and Plan:  * Acute on chronic congestive heart failure (HCC) Recent transthoracic echo which showed a left ventricular ejection fraction 65 to 70% with normal wall motion and normal diastolic function. RSVP was normal. On 20mg  Torsemide at home.  Continue IV Lasix, strict intake  and output charting, fluid restriction. Question environmental factors including mold concern exacerbating symptoms. CTA chest in June showing pneumonitis vs pneumonia. CXR with possible pneumonia but symptoms more align with CHF so will hold antibiotics.  Currently on 2 L of oxygen by nasal cannula from 6 L on presentation.  Will continue to wean oxygen as able.  Essential hypertension -continue losartan.  Continue to monitor blood pressure. BP on the lower side today.  Will put holding parameters.   T2DM  with hyperglycemia -Hemoglobin A1C 9.8 two weeks ago.  Continue sliding scale insulin, acuchecks, diabetic diet. Closely monitor.  Latest POC glucose of 296  Acute on chronic hypoxic respiratory failure (HCC) On 2 L of oxygen at baseline at home.  Currently on 2 L/min.  Was initially on BiPAP at outside hospital currently on nasal cannula oxygen.   CTA chest in June showing pneumonitis vs pneumonia.  Findings more consistent with congestive heart failure at this time.   Patient did have previous workup for interstitial lung disease at [facility] and was negative at that time. Rheumatoid factor was slightly elevated at 16 however patient had no joint pains or other symptoms of inflammatory arthritis and it was felt that this diagnosis was extremely likely.  Will need to have pulmonary follow-up as outpatient.   Hypokalemia Potassium of 3.0 today.  Will continue to replenish orally.  Will give 40 mEq x 2 today.  Check levels in AM.  Magnesium level of 1.8.  Add oral magnesium oxide  Hyponatremia.  Sodium level of 130 from initial 129.  Will continue to  monitor.  On diuretics.  Check BMP in AM.  CAD (coronary artery disease) Continue Plavix.  No chest pain   PTSD (post-traumatic stress disorder) Depression -continue bupropion  Mild leukocytosis.  Likely reactive.  Normalized at this time.  No signs of infection.    Morbid obesity with BMI of 50.0-59.9, adult (HCC) Would benefit from ongoing  weight loss as outpatient.  Housing difficulty with lack of AC.  Possible mold at house.  Discussed about it.  TOC on board for possible assistance with transport on discharge and other help.    DVT prophylaxis: enoxaparin (LOVENOX) injection 40 mg Start: 03/16/23 2200   Code Status:     Code Status: Full Code  Disposition: Home likely in 1 to 2 days  Status is: Inpatient  Remains inpatient appropriate because: Congestive heart failure, respiratory failure, IV diuretics, hypokalemia.   Family Communication: None at bedside  Consultants:  None  Procedures:  None  Antimicrobials:  None  Anti-infectives (From admission, onward)    None      Subjective:  Today, patient was seen and examined at bedside.  Patient states that the symptoms are little better with breathing.  Denies any chest pain, fever, chills or rigor.  Leg swelling has gone down some.  Had chest discomfort while coughing.   Objective: Vitals:   03/18/23 0040 03/18/23 0447 03/18/23 0742 03/18/23 0743  BP: 114/61 107/71  (!) 97/43  Pulse: 81 76 72 76  Resp: 16 18  16   Temp: 97.8 F (36.6 C) 97.7 F (36.5 C)  (!) 97 F (36.1 C)  TempSrc: Oral Oral  Axillary  SpO2: 96% 100% 100% 100%  Weight:  126 kg    Height:       No intake or output data in the 24 hours ending 03/18/23 1055  Filed Weights   03/16/23 2126 03/17/23 0500 03/18/23 0447  Weight: 124.1 kg 124.2 kg 126 kg    Physical Examination: Body mass index is 44.82 kg/m.   General: Morbidly obese built, not in obvious distress, on nasal cannula oxygen at 2 L/min HENT:   No scleral pallor or icterus noted. Oral mucosa is moist.  Chest:    Diminished breath sounds bilaterally.  CVS: S1 &S2 heard. No murmur.  Regular rate and rhythm. Abdomen: Soft, nontender, nondistended.  Bowel sounds are heard.   Extremities: No cyanosis, clubbing with peripheral trace edema, Psych: Alert, awake and oriented, normal mood CNS:  No cranial nerve deficits.   Power equal in all extremities.   Skin: Warm and dry.  No rashes noted.  Data Reviewed:   CBC: Recent Labs  Lab 03/17/23 0043 03/17/23 0635 03/18/23 0224  WBC 13.1* 12.6* 9.9  HGB 13.2 13.9 13.1  HCT 39.9 42.8 40.3  MCV 83.5 85.4 89.0  PLT 532* 578* 498*    Basic Metabolic Panel: Recent Labs  Lab 03/17/23 0043 03/17/23 0635 03/18/23 0224  NA 129* 132* 130*  K 2.7* 3.6 3.0*  CL 87* 89* 87*  CO2 30 32 31  GLUCOSE 381* 288* 301*  BUN 19 18 28*  CREATININE 0.96 0.92 1.39*  CALCIUM 8.9 9.2 9.0  MG  --   --  1.8    Liver Function Tests: No results for input(s): "AST", "ALT", "ALKPHOS", "BILITOT", "PROT", "ALBUMIN" in the last 168 hours.   Radiology Studies: No results found.    LOS: 2 days    Joycelyn Das, MD [facility] Available via Epic secure chat 7am-7pm After these hours, please refer to  coverage provider listed on [email or web address] 03/18/2023, 10:55 AM

## 2023-03-18 NOTE — Progress Notes (Signed)
Mobility Specialist Progress Note:    03/18/23 1610  Mobility  Activity Ambulated with assistance in hallway  Level of Assistance Standby assist, set-up cues, supervision of patient - no hands on  Assistive Device Four point cane  Distance Ambulated (ft) 250 ft  Activity Response Tolerated well  Mobility Referral Yes  $Mobility charge 1 Mobility  Mobility Specialist Start Time (ACUTE ONLY) 1436  Mobility Specialist Stop Time (ACUTE ONLY) 1450  Mobility Specialist Time Calculation (min) (ACUTE ONLY) 14 min   Pt received in chair, agreeable to ambulate. Pt needed no physical assistance throughout session. During ambulation pt ambulated on 4L/min SPO2, WFL. Pt took x2 standing break d/t back pain, no rating given. No SOB noted during session. Returning to room pt requests to get back in chair w/ call bell and personal belongings in reach. All needs met.  Thompson Grayer Mobility Specialist  Please contact vis Secure Chat or  Rehab Office [phone]

## 2023-03-18 NOTE — Progress Notes (Signed)
CSW received consult for patient. CSW spoke with patient at bedside. CSW offered patient transportation resources. Patient accepted. All questions answered. Patient reports she will need transportation assistance when medically ready for dc. TOC will follow up on transportation needs when patient medically ready for dc.

## 2023-03-18 NOTE — Inpatient Diabetes Management (Signed)
Inpatient Diabetes Program Recommendations  AACE/ADA: New Consensus Statement on Inpatient Glycemic Control (2015)  Target Ranges:  Prepandial:   less than 140 mg/dL      Peak postprandial:   less than 180 mg/dL (1-2 hours)      Critically ill patients:  140 - 180 mg/dL   Lab Results  Component Value Date   GLUCAP 296 (H) 03/18/2023   HGBA1C 8.7 (A) 10/19/2022    Review of Glycemic Control  Latest Reference Range & Units 03/17/23 07:28 03/17/23 11:35 03/17/23 16:15 03/17/23 21:18 03/18/23 07:42  Glucose-Capillary 70 - 99 mg/dL 725 (H) 366 (H) 440 (H) 270 (H) 296 (H)   Diabetes history: DM 2 Outpatient Diabetes medications: Lantus 35 units qhs, Novolog 14-20 units tid Current orders for Inpatient glycemic control:  Novolog 0-20 units tid and hs  Inpatient Diabetes Program Recommendations:    -   Add Semglee 20 units  Thanks,  Christena Deem RN, MSN, BC-ADM Inpatient Diabetes Coordinator Team Pager [phone] (8a-5p)

## 2023-03-18 NOTE — Plan of Care (Signed)

## 2023-03-18 NOTE — Progress Notes (Signed)
Heart Failure Navigator Progress Note  Assessed for Heart & Vascular TOC clinic readiness.  Patient does not meet criteria due to EF 65-70%, has a scheduled [facility] appointment on 04/08/2023. .   Navigator will sign off at this time.    Rhae Hammock, BSN, Scientist, clinical (histocompatibility and immunogenetics) Only

## 2023-03-19 ENCOUNTER — Ambulatory Visit: Payer: Medicaid Other | Admitting: Nurse Practitioner

## 2023-03-19 DIAGNOSIS — I5033 Acute on chronic diastolic (congestive) heart failure: Secondary | ICD-10-CM | POA: Diagnosis not present

## 2023-03-19 DIAGNOSIS — I10 Essential (primary) hypertension: Secondary | ICD-10-CM | POA: Diagnosis not present

## 2023-03-19 DIAGNOSIS — E1142 Type 2 diabetes mellitus with diabetic polyneuropathy: Secondary | ICD-10-CM | POA: Diagnosis not present

## 2023-03-19 LAB — GLUCOSE, CAPILLARY: Glucose-Capillary: 259 mg/dL — ABNORMAL HIGH (ref 70–99)

## 2023-03-19 MED ORDER — MUPIROCIN CALCIUM 2 % EX CREA
TOPICAL_CREAM | Freq: Every day | CUTANEOUS | Status: DC
  Filled 2023-03-19: qty 15

## 2023-03-19 MED ORDER — TORSEMIDE 20 MG PO TABS: 20.0000 mg | ORAL_TABLET | Freq: Every day | ORAL | Status: DC

## 2023-03-19 NOTE — Plan of Care (Signed)

## 2023-03-19 NOTE — TOC Progression Note (Signed)
Transition of Care [facility]) - Progression Note    Patient Details  Name: Kathryn Jimenez MRN: [id number] Date of Birth: 19-Jun-1971  Transition of Care [facility]) CM/SW Contact  Delilah Shan, LCSWA Phone Number: 03/19/2023, 10:37 AM  Clinical Narrative:     CSW spoke with patient at bedside. Patient informed CSW that she spoke with her niece and her niece will be able to pick her up at dc. CSW offered patient [facility]. Patient accepted. All questions answered. No further questions reported at this time.  Expected Discharge Plan: Home/Self Care Barriers to Discharge: No Barriers Identified  Expected Discharge Plan and Services   Discharge Planning Services: CM Consult   Living arrangements for the past 2 months: Single Family Home Expected Discharge Date: 03/19/23               DME Arranged: Oxygen DME Agency: [employer] Date DME Agency Contacted: 03/19/23 Time DME Agency Contacted: [phone] Representative spoke with at DME Agency: Zack             Social Determinants of Health (SDOH) Interventions SDOH Screenings   Food Insecurity: No Food Insecurity (03/16/2023)  Recent Concern: Food Insecurity - Food Insecurity Present (01/23/2023)   Received from [facility]  Housing: Low Risk  (03/16/2023)  Transportation Needs: Unmet Transportation Needs (03/16/2023)  Utilities: Not At Risk (03/16/2023)  Alcohol Screen: Low Risk  (07/20/2022)  Depression (PHQ2-9): Low Risk  (03/16/2023)  Financial Resource Strain: Low Risk  (03/16/2023)  Physical Activity: Inactive (03/16/2023)  Social Connections: Socially Isolated (03/16/2023)  Stress: Stress Concern Present (03/16/2023)  Tobacco Use: Low Risk  (03/15/2023)   Received from [facility]  Health Literacy: Adequate Health Literacy (03/16/2023)    Readmission Risk Interventions     No data to display

## 2023-03-19 NOTE — Discharge Summary (Signed)
Physician Discharge Summary  Kathryn Jimenez ZOX:[id number] DOB: [birth year] DOA: 03/16/2023  PCP: Arrie Senate, FNP  Admit date: 03/16/2023 Discharge date: 03/19/2023  Admitted From: Home  Discharge disposition: Home    Recommendations for Outpatient Follow-Up:   Follow up with your primary care provider in one week.  Check CBC, BMP, magnesium in the next visit Follow-up with cardiology as has been scheduled on 04/08/2023. Patient will benefit from  pulmonary follow-up as outpatient.  Discharge Diagnosis:   Principal Problem:   Acute on chronic congestive heart failure (HCC) Active Problems:   T2DM (type 2 diabetes mellitus) (HCC)   Primary hypertension   Morbid obesity with BMI of 50.0-59.9, adult (HCC)   Acute on chronic hypoxic respiratory failure (HCC)   Depression   PTSD (post-traumatic stress disorder)   CAD (coronary artery disease)   Hypokalemia   Discharge Condition: Improved.  Diet recommendation: Low sodium, heart healthy.  carbohydrate-modified.    Wound care: None.  Code status: Full.   History of Present Illness:   Kathryn Jimenez is a 52 y.o. female with medical history significant of HFpEF, SVT, NSTEMI, HTN, T2DM, HLD, history of CVA, PTSD, nocturnal oxygen use, depression, borderline personality, morbid obesity with BMI 50 presented to [facility] with cough, dyspnea and hypoxia with lower extremity edema.  Initially requiring BiPAP with increased work of breathing and pulse ox was in the 80s. Of note, she was hospitalized at [facility] early June for CHF exacerbation and had pulmonology consult with extensive workup with for autoimmune interstitial lung disease which was all negative. Rheumatoid factor was slightly elevated at 16 however patient had no joint pains or other symptoms of inflammatory arthritis and it was felt that this diagnosis was extremely likely.  She was diuresed with IV Lasix, and treated with nebs and steroids for  potential asthma exacerbation with improvement. She was started on triple therapy for asthma and prednisone taper.  She was then re-admitted from 7/11-7/14 at [facility] for acute on chronic diastolic CHF and acute on chronic hypoxic respiratory failure. Required 5L from her baseline of 2L. She was successfully treated on IV diuresis and was able to return home on 2L of O2 and 20mg  of Torsemide.  On this presentation, CXR  showed worsening bilateral lung opacities consistent with edema or multifocal pneumonia. Was able to wean off Bipap to [facility] prior to transfer here to [facility]. .  Patient believes she has a mold issue at the house. She does not have any heating or AC in her home.    Hospital Course:   Following conditions were addressed during hospitalization as listed below,  Acute on chronic congestive heart failure (HCC) Recent transthoracic echo which showed a left ventricular ejection fraction 65 to 70% with normal wall motion and normal diastolic function. RSVP was normal. On 20mg  Torsemide at home.  Received IV Lasix during hospitalization with significant improvement in her symptoms.  She was going down to her home level of oxygen. Question environmental factors including mold concern exacerbating symptoms.  She was advised to discuss this with her family at home   Essential hypertension -continue losartan and metoprolol on discharge.  T2DM  with hyperglycemia -Hemoglobin A1C 9.8 two weeks ago.  Advised to continue insulin regimen at home.     Acute on chronic hypoxic respiratory failure (HCC) On 2 L of oxygen at baseline at home.  Currently on 2 L/min.  Was initially on BiPAP at outside hospital.   CTA chest in June  showing pneumonitis vs pneumonia.  Findings more consistent with congestive heart failure at this time.   Patient did have previous workup for interstitial lung disease at [facility] and was negative at that time. Rheumatoid factor was slightly elevated at 16 however patient  had no joint pains or other symptoms of inflammatory arthritis and it was felt that this diagnosis was extremely likely.  Will benefit from  pulmonary follow-up as outpatient.   Hypokalemia Potassium of 3.6 today.  Replenished supplement  Hyponatremia.  Sodium level of 130 from initial 129.  Asymptomatic.  CAD (coronary artery disease) Continue Plavix.  No chest pain   PTSD (post-traumatic stress disorder) Depression -continue bupropion   Mild leukocytosis.  Likely reactive.  Normalized at this time.  No signs of infection.     Morbid obesity with BMI of 50.0-59.9, adult  Would benefit from ongoing weight loss as outpatient.   Housing difficulty with lack of AC.  Possible mold at house.  Discussed about it.  TOC on board for possible assistance with transport on discharge and other help.   Disposition.  At this time, patient is stable for disposition home with outpatient PCP and cardiology follow-up  Medical Consultants:   None.  Procedures:    None. Subjective:   Today, patient was seen and examined at bedside.  Feels okay and at her baseline.  Denies any chest pain, dizziness lightheadedness.  Breathing has improved.  Discharge Exam:   Vitals:   03/19/23 0540 03/19/23 0822  BP: 107/62   Pulse: 62 92  Resp: 18 20  Temp: 98 F (36.7 C)   SpO2: 99%    Vitals:   03/18/23 2005 03/18/23 2021 03/19/23 0540 03/19/23 0822  BP:  95/61 107/62   Pulse: 82 80 62 92  Resp: 18 18 18 20   Temp:  97.6 F (36.4 C) 98 F (36.7 C)   TempSrc:  Axillary Oral   SpO2: 97% 95% 99%   Weight:   126.8 kg   Height:       Body mass index is 45.13 kg/m.   General: Alert awake, not in obvious distress,  morbidly obese, on nasal cannula oxygen HENT: pupils equally reacting to light,  No scleral pallor or icterus noted. Oral mucosa is moist.  Chest: Diminished breath sounds bilaterally.  CVS: S1 &S2 heard. No murmur.  Regular rate and rhythm. Abdomen: Soft, nontender, nondistended.   Bowel sounds are heard.   Extremities: No cyanosis, clubbing with trace edema  Psych: Alert, awake and oriented, normal mood CNS:  No cranial nerve deficits.  Power equal in all extremities.   Skin: Warm and dry.  No rashes noted.  The results of significant diagnostics from this hospitalization (including imaging, microbiology, ancillary and laboratory) are listed below for reference.     Diagnostic Studies:   No results found.   Labs:   Basic Metabolic Panel: Recent Labs  Lab 03/17/23 0043 03/17/23 0635 03/18/23 0224 03/19/23 0105  NA 129* 132* 130* 130*  K 2.7* 3.6 3.0* 3.6  CL 87* 89* 87* 90*  CO2 30 32 31 31  GLUCOSE 381* 288* 301* 265*  BUN 19 18 28* 31*  CREATININE 0.96 0.92 1.39* 1.64*  CALCIUM 8.9 9.2 9.0 9.2  MG  --   --  1.8 2.0   GFR Estimated Creatinine Clearance: 55.9 mL/min (A) (by C-G formula based on SCr of 1.64 mg/dL (H)). Liver Function Tests: No results for input(s): "AST", "ALT", "ALKPHOS", "BILITOT", "PROT", "ALBUMIN" in the last 168 hours.  No results for input(s): "LIPASE", "AMYLASE" in the last 168 hours. No results for input(s): "AMMONIA" in the last 168 hours. Coagulation profile No results for input(s): "INR", "PROTIME" in the last 168 hours.  CBC: Recent Labs  Lab 03/17/23 0043 03/17/23 0635 03/18/23 0224 03/19/23 0105  WBC 13.1* 12.6* 9.9 8.7  HGB 13.2 13.9 13.1 12.6  HCT 39.9 42.8 40.3 38.9  MCV 83.5 85.4 89.0 87.6  PLT 532* 578* 498* 480*   Cardiac Enzymes: No results for input(s): "CKTOTAL", "CKMB", "CKMBINDEX", "TROPONINI" in the last 168 hours. BNP: Invalid input(s): "POCBNP" CBG: Recent Labs  Lab 03/18/23 0742 03/18/23 1225 03/18/23 1658 03/18/23 2142 03/19/23 0706  GLUCAP 296* 343* 308* 336* 259*   D-Dimer No results for input(s): "DDIMER" in the last 72 hours. Hgb A1c No results for input(s): "HGBA1C" in the last 72 hours. Lipid Profile No results for input(s): "CHOL", "HDL", "LDLCALC", "TRIG", "CHOLHDL",  "LDLDIRECT" in the last 72 hours. Thyroid function studies No results for input(s): "TSH", "T4TOTAL", "T3FREE", "THYROIDAB" in the last 72 hours.  Invalid input(s): "FREET3" Anemia work up No results for input(s): "VITAMINB12", "FOLATE", "FERRITIN", "TIBC", "IRON", "RETICCTPCT" in the last 72 hours. Microbiology No results found for this or any previous visit (from the past 240 hour(s)).   Discharge Instructions:   Discharge Instructions     (HEART FAILURE PATIENTS) Call MD:  Anytime you have any of the following symptoms: 1) 3 pound weight gain in 24 hours or 5 pounds in 1 week 2) shortness of breath, with or without a dry hacking cough 3) swelling in the hands, feet or stomach 4) if you have to sleep on extra pillows at night in order to breathe.   Complete by: As directed    Avoid straining   Complete by: As directed    Diet - low sodium heart healthy   Complete by: As directed    Discharge instructions   Complete by: As directed    Follow-up with cardiology as has been scheduled on 04/08/2023.  Follow up with your primary care provider in one week. Please follow low-salt diet, fluid restriction 1500 mL/day. (50 ounces) Continue to take medications as prescribed.  Take water pill as prescribed.  Take daily weights and keep a log of it to present to your primary care doctor and cardiology in the next visit.Marland Kitchen   Heart Failure patients record your daily weight using the same scale at the same time of day   Complete by: As directed    Increase activity slowly   Complete by: As directed    STOP any activity that causes chest pain, shortness of breath, dizziness, sweating, or exessive weakness   Complete by: As directed       Allergies as of 03/19/2023       Reactions   Actos [pioglitazone] Swelling   Augmentin [amoxicillin-pot Clavulanate] Diarrhea, Nausea Only, Other (See Comments)   Caused severe diarrhea   Citrus Other (See Comments)   "Issues with my tongue"- the fleshy part of  the fruit   Invokana [canagliflozin] Other (See Comments)   Yeast infection   Lopid [gemfibrozil] Other (See Comments)   Made the tongue burn   Metoprolol Other (See Comments)   Headaches   Naproxen Sodium Other (See Comments)   Patient states she "felt loopy"   Penicillins Diarrhea, Other (See Comments)   Violent diarrhea- must have Diflucan   Pineapple Other (See Comments)   "Issues with my tongue"   Statins Other (See Comments)  Joint pain   Tape Itching, Other (See Comments)   Tape, EKG leads, and Band-Aids - Itching and redness   Amlodipine Cough   Clavulanic Acid Nausea Only   Hctz [hydrochlorothiazide] Rash   Lisinopril Cough   Morphine And Codeine Itching, Rash        Medication List     STOP taking these medications    furosemide 40 MG tablet Commonly known as: LASIX       TAKE these medications    acetaminophen 500 MG tablet Commonly known as: TYLENOL Take 500 mg by mouth every 6 (six) hours as needed for mild pain or moderate pain.   albuterol 108 (90 Base) MCG/ACT inhaler Commonly known as: VENTOLIN HFA 2 PUFFS INTO THE LUNGS EVERY 6 HOURS AS NEEDED FOR WHEEZING OR SHORTNESS OF BREATH. Strength: 108 (90 Base) MCG/ACT What changed:  how much to take how to take this when to take this   budesonide-formoterol 160-4.5 MCG/ACT inhaler Commonly known as: SYMBICORT Inhale 2 puffs into the lungs in the morning and at bedtime.   buPROPion 150 MG 24 hr tablet Commonly known as: WELLBUTRIN XL Take 150 mg by mouth daily.   cetirizine 10 MG tablet Commonly known as: ZYRTEC Take 10 mg by mouth daily.   clobetasol cream 0.05 % Commonly known as: TEMOVATE Apply 1 application topically 2 (two) times daily as needed (scalp psoriasis).   clopidogrel 75 MG tablet Commonly known as: PLAVIX Take 1 tablet (75 mg total) by mouth daily.   diphenhydrAMINE 25 mg capsule Commonly known as: BENADRYL Take 25 mg by mouth every 6 (six) hours as needed for  allergies.   ezetimibe 10 MG tablet Commonly known as: ZETIA Take 1 tablet (10 mg total) by mouth daily.   fluconazole 150 MG tablet Commonly known as: DIFLUCAN Take one tablet once then repeat in 3 days if needed What changed:  how much to take how to take this when to take this additional instructions   FLUoxetine 40 MG capsule Commonly known as: PROZAC Take 40 mg by mouth daily.   guaiFENesin 600 MG 12 hr tablet Commonly known as: Mucinex Take 1 tablet (600 mg total) by mouth 2 (two) times daily. What changed:  when to take this reasons to take this   Lantus SoloStar 100 UNIT/ML Solostar Pen Generic drug: insulin glargine Inject 35 Units into the skin at bedtime.   Liletta (52 MG) 20.1 MCG/DAY Iud IUD Generic drug: levonorgestrel 1 each by Intrauterine route once. Inserted 04/19/21   loperamide 2 MG capsule Commonly known as: IMODIUM Take 2 mg by mouth as needed for diarrhea or loose stools.   LORazepam 0.5 MG tablet Commonly known as: ATIVAN Take 0.25-0.5 mg by mouth daily as needed for anxiety.   losartan 100 MG tablet Commonly known as: COZAAR Take 100 mg by mouth daily.   metoprolol succinate 50 MG 24 hr tablet Commonly known as: TOPROL-XL Take 1 tablet (50 mg total) by mouth daily. Take with or immediately following a meal.   montelukast 10 MG tablet Commonly known as: SINGULAIR take one tablet (10 MILLIGRAM dose) by mouth at bedtime. What changed: See the new instructions.   NovoLOG FlexPen 100 UNIT/ML FlexPen Generic drug: insulin aspart Inject 14-20 Units into the skin 3 (three) times daily with meals.   nystatin powder Commonly known as: MYCOSTATIN/NYSTOP Apply 1 application  topically daily as needed (for fungal infections).   oxybutynin 10 MG 24 hr tablet Commonly known as: DITROPAN-XL Take 1 tablet (  10 mg total) by mouth at bedtime.   OXYGEN Inhale 2 L/min into the lungs continuous.   potassium chloride 10 MEQ tablet Commonly known  as: KLOR-CON Take 2 tablets (20 mEq total) by mouth daily.   Restasis 0.05 % ophthalmic emulsion Generic drug: cycloSPORINE Place 1 drop into both eyes 2 (two) times daily.   terconazole 0.8 % vaginal cream Commonly known as: TERAZOL 3 Place 1 applicator vaginally at bedtime.   torsemide 20 MG tablet Commonly known as: DEMADEX Take 1 tablet (20 mg total) by mouth daily. Can take extra tablet with weight gain greater than 3 lb in 24 hours What changed: when to take this   umeclidinium bromide 62.5 MCG/ACT Aepb Commonly known as: INCRUSE ELLIPTA Inhale 1 puff into the lungs daily.   valACYclovir 500 MG tablet Commonly known as: VALTREX Take 1 tablet (500 mg total) by mouth daily.        Follow-up Information     Arrie Senate, FNP Follow up in 1 week(s).   Specialty: Family Medicine Why: check blood work, routine followup Contact information: [address] [place] [address] [phone]                  Time coordinating discharge: 39 minutes  Signed:  Edi Gorniak  [facility] 03/19/2023, 4:18 PM

## 2023-03-19 NOTE — Consult Note (Addendum)
WOC Nurse Consult Note: Reason for Consult: Consult requested for right foot wound. Wound type: Tip of right third toe with dark red-purple blood blister with dry scabbed intact skin; .5X.5cm, location is high risk to evolve into full thickness skin loss.  Dressing procedure/placement/frequency: Topical treatment orders provided for bedside nurses to perform as follows to promote moist healing: Apply Bactroban to right 3rd toe Q day, then cover with foam dressing.  Change foam dressing Q 3 days or PRN soiling. Please re-consult if further assistance is needed.  Thank-you,  Cammie Mcgee MSN, RN, CWOCN, [place], CNS [phone]

## 2023-03-19 NOTE — TOC Transition Note (Signed)
Transition of Care [facility]) - CM/SW Discharge Note   Patient Details  Name: Kathryn Jimenez MRN: [id number] Date of Birth: 1971/07/06  [facility]) CM/SW Contact:  Gala Lewandowsky, RN Phone Number: 03/19/2023, 10:19 AM   Clinical Narrative: Case Manager called [employer] for travel tank for home. Family will transport patient home in private vehicle. No further needs identified at this time .     Final next level of care: Home/Self Care Barriers to Discharge: No Barriers Identified  Discharge Plan and Services Additional resources added to the After Visit Summary for     Discharge Planning Services: CM Consult            DME Arranged: Oxygen DME Agency: [employer] Date DME Agency Contacted: 03/19/23 Time DME Agency Contacted: [phone] Representative spoke with at DME Agency: Zack  Social Determinants of Health (SDOH) Interventions SDOH Screenings   Food Insecurity: No Food Insecurity (03/16/2023)  Recent Concern: Food Insecurity - Food Insecurity Present (01/23/2023)   Received from [facility]  Housing: Low Risk  (03/16/2023)  Transportation Needs: Unmet Transportation Needs (03/16/2023)  Utilities: Not At Risk (03/16/2023)  Alcohol Screen: Low Risk  (07/20/2022)  Depression (PHQ2-9): Low Risk  (03/16/2023)  Financial Resource Strain: Low Risk  (03/16/2023)  Physical Activity: Inactive (03/16/2023)  Social Connections: Socially Isolated (03/16/2023)  Stress: Stress Concern Present (03/16/2023)  Tobacco Use: Low Risk  (03/15/2023)   Received from [facility]  Health Literacy: Adequate Health Literacy (03/16/2023)  Readmission Risk Interventions     No data to display

## 2023-03-20 ENCOUNTER — Telehealth: Payer: Self-pay

## 2023-03-20 NOTE — Transitions of Care (Post Inpatient/ED Visit) (Signed)
   03/20/2023  Name: Kathryn Jimenez MRN: [id number] DOB: 05/23/1971  Today's TOC FU Call Status: Today's TOC FU Call Status:: Unsuccessful Call (1st Attempt) Unsuccessful Call (1st Attempt) Date: 03/20/23  Attempted to reach the patient regarding the most recent Inpatient/ED visit.  Follow Up Plan: Additional outreach attempts will be made to reach the patient to complete the Transitions of Care (Post Inpatient/ED visit) call.   Jodelle Gross, RN, BSN, CCM Care Management Coordinator [place] Phone: [phone]/Fax: [phone]

## 2023-03-21 ENCOUNTER — Telehealth: Payer: Self-pay

## 2023-03-21 NOTE — Transitions of Care (Post Inpatient/ED Visit) (Signed)
   03/21/2023  Name: Kathryn Jimenez MRN: [id number] DOB: [birth year]  Today's TOC FU Call Status: Today's TOC FU Call Status:: Unsuccessful Call (2nd Attempt) Unsuccessful Call (2nd Attempt) Date: 03/21/23  Attempted to reach the patient regarding the most recent Inpatient/ED visit.  Follow Up Plan: Additional outreach attempts will be made to reach the patient to complete the Transitions of Care (Post Inpatient/ED visit) call.   Jodelle Gross, RN, BSN, CCM Care Management Coordinator [place] Phone: [phone]/Fax: [phone]

## 2023-03-22 ENCOUNTER — Encounter: Payer: Self-pay | Admitting: *Deleted

## 2023-03-22 ENCOUNTER — Telehealth: Payer: Self-pay | Admitting: *Deleted

## 2023-03-22 NOTE — Transitions of Care (Post Inpatient/ED Visit) (Signed)
   03/22/2023  Name: Cybil Cassells MRN: [id number] DOB: 07-16-1971  Today's TOC FU Call Status: Today's TOC FU Call Status:: Unsuccessful Call (3rd Attempt) Unsuccessful Call (3rd Attempt) Date: 03/22/23  Attempted to reach the patient regarding the most recent Inpatient visit; left HIPAA compliant voice message requesting call back  Follow Up Plan: No further outreach attempts will be made at this time. We have been unable to contact the patient.  Caryl Pina, RN, BSN, CCRN Alumnus RN CM Care Coordination/ Transition of Care- [facility] Care Management [phone]: direct office

## 2023-03-25 ENCOUNTER — Other Ambulatory Visit: Payer: Self-pay | Admitting: Family Medicine

## 2023-03-25 DIAGNOSIS — J4520 Mild intermittent asthma, uncomplicated: Secondary | ICD-10-CM

## 2023-03-26 ENCOUNTER — Encounter: Payer: Medicaid Other | Admitting: Family Medicine

## 2023-04-02 ENCOUNTER — Inpatient Hospital Stay: Payer: MEDICAID | Admitting: Family Medicine

## 2023-04-04 ENCOUNTER — Telehealth: Payer: Self-pay | Admitting: Family Medicine

## 2023-04-04 ENCOUNTER — Other Ambulatory Visit: Payer: Self-pay | Admitting: Family Medicine

## 2023-04-04 DIAGNOSIS — R0902 Hypoxemia: Secondary | ICD-10-CM

## 2023-04-04 DIAGNOSIS — I5032 Chronic diastolic (congestive) heart failure: Secondary | ICD-10-CM

## 2023-04-04 DIAGNOSIS — N39498 Other specified urinary incontinence: Secondary | ICD-10-CM

## 2023-04-04 NOTE — Telephone Encounter (Signed)
Kathryn Jimenez,  Pt last seen with you on 7/11 for a video visit.  Do you need to see her again or can the new O2 Concentrator be ordered without an appt?

## 2023-04-04 NOTE — Telephone Encounter (Signed)
Pt has been made aware.  States that she wants to keep appt on 8/22 but social working is having difficulty providing transportation.  Informed pt to call next week to let us know about transportation and if unable to come to office we can see if Jerrel Ivory can approve it for a virtual visit.  DME for O2 given to [facility]

## 2023-04-04 NOTE — Progress Notes (Unsigned)
Cardiology Office Note:    Date:  04/04/2023   ID:  Zarahi Ribeiro, DOB [birth year], MRN [id number]  PCP:  Arrie Senate, FNP  Cardiologist:  Dietrich Pates, MD  Electrophysiologist:  None   Referring MD: Arrie Senate*   Chief Complaint: hospital follow-up of CHF  History of Present Illness:    Kathryn Jimenez is a 52 y.o. female with a history of CAD with high grade RPDA disease (not amenable to PCI) but otherwise only mild non-obstructive disease noted on recent cardiac catheterization on 06/19/2022 , chronic diastolic CHF, paroxysmal SVT, chronic hypoxic respiratory failure on 2L of O2 at home ***, hypertension, hyperlipidemia, type 2 diabetes mellitus, CVA in 2018, morbid obesity with BMI of 50 ***, PTSD, and borderline personality disorder who is followed by Dr. Tenny Craw and presents today for hospital follow-up of CHF.   She was admitted at [facility] from 05/22/2022 to 05/25/2022 for acute hypoxic respiratory failure due to multifocal pneumonia and new onset diastolic CHF. Echo showed LVEF of 60-65% with grade 2 diastolic dysfunction, mildly dilated RV with normal systolic function, mildly elevated right atrial pressure, and no significant valvular disease. She was diuresed with IV Lasix and then transitioned to PO Lasix. She was also started on Losartan and Jardiance. She was discharged on home O2.  Patient was first seen by Cardiology during recent admission from 06/18/2022 to 06/20/2022 for SVT after presenting with palpitations and atypical chest pain. She received Adenosine in the field and thankfully converted to normal sinus rhythm. She also reported some atypical chest pain that sounded musculoskeletal in nature as well as some shortness of breath. High-sensitivity troponin peaked at 1,254. Pro-BNP was elevated and peaked at 1,027.  She was treated with IV Lasix in the ED with some improvement in her shortness of breath and then was resumed on her home PO Lasix. Given  degree of troponin elevation and multiple CV risk factors, decision was made to proceed with [facility] which showed 90% stenosis of distal RPDA (not amenable to PCI) and otherwise only mild non-obstructive CAD. Also showed mild pulmonary hypertension and LVEDP of 13 mmHg. Medical therapy was recommended. Troponin elevation was felt to be due to type 2 NSTEMI (demand ischemia) in setting of SVT and underlying high grade RPDA disease. Outpatient sleep study was ordered at follow-up visit and showed no significant sleep disordered breathing but did note possible short runs of paroxysmal atrial fibrillation. Therefore, a 30-day Event Monitor was ordered and showed a few short bursts of NSVT and occasional PVCs but no atrial fibrillation.  She was last seen by Dr. Tenny Craw in 10/2022 at which time she was stable from a cardiac standpoint. Losartan was increased for additional BP control.  Since last visit, she has had multiple admissions this summer (starting in 12/2022) at [facility], [facility], and [facility] for acute on chronic hypoxic respiratory secondary to pneumonia, CHF, and asthma exacerbations (although mostly for CHF). Echo in 01/2023 during one of these admissions showed LVEF of 65-70% with normal wall motion and diastolic parameters, normal RV size and function, and no significant valvular disease. She was also seen by Pulmonology at [facility] during an admission in 01/2023 and underwent extensive work-up for autoimmune interstitial lung disease which was negative. Rheumatoid factor was slightly elevated at 16; however, patient had no joint pains or other symptoms of inflammatory arthritis and it was felt that this diagnosis was extremely unlikely. Acute respiratory failure during that admission was felt to be due to CHF and  an asthma exacerbation and she was treated accordingly. She was most recently admitted at [facility] from 03/16/2023 to 03/19/2023 for acute on chronic hypoxic respiratory failure secondary to acute on chronic  CHF after presenting with dyspnea, cough, and lower extremity edema. She initially required BiPAP.  She was treated with IV Lasix with significant improvement in symptoms and then transitioned back to Torsemide. Also noted to have AKI and hypokalemia during admission. Of note, she was not seen by Cardiology during this admission.  Patient presents today for follow-up. ***  Chronic Diastolic CHF Patient has had multiple admission over the last 3 months for acute on chronic hypoxic respiratory failure secondary to CHF. Echo in 01/2023 during one of these admissions at [facility]  showed LVEF of 65-70% with normal wall motion and diastolic parameters, normal RV size and function, and no significant valvular disease. Most recently admitted at [facility] in 02/2023 for acute on chronic CHF and was diuresed with IV Lasix and then transitioned back to PO Torsemide. - Volume status difficult to assess due to body habitus but *** - Continue Torsemide 20mg  daily. Can continue to take an extra 20mg  as needed for weight gain or worsening lower extremity edema.  - Previously on Jardiance *** - Discussed importance of daily weights and sodium/ fluid restrictions. - Will repeat BMET today. If creatinine stable, will start Spironolactone. ***  CAD LHC in 05/2022 showed 90% stenosis of distal RPDA (not amenable to PCI) and otherwise only mild non-obstructive CAD. - No angina. - Continue Plavix 75mg  daily which patient has been on since time of her stroke. - Continue Toprol-XL 50mg  daily. - Intolerant to statins. Continue Zetia 10mg  daily.    Chronic Hypoxic Respiratory Failure Patient has had multiple admission across multiple different hospital symptoms this summer (since 12/2022) for acute on chronic hypoxic respiratory failure secondary to pneumonia, CHF, and asthma exacerbation. She was seen by Pulmonology at [facility] during one of these admissions and had underwent extensive work-up for autoimmune interstitial lung disease  which was negative. Rheumatoid factor was slightly elevated at 16; however, patient had no joint pains or other symptoms of inflammatory arthritis and it was felt that this diagnosis was extremely unlikely.  - Stable. Remains on 2L of O2 at home. - Pulmonology ***   Paroxysmal SVT History of SVT in 05/2022 requiring Adenosine.  - No palpitations. *** - She notes palpitations associated with panic attacks but nothing like when she was in SVT.  - Continue Toprol-XL 50mg  daily.   Hypertension BP well controlled.  - Continue current medications: Losartan 100mg  daily and Toprol-XL 50mg  daily. - She describes some mild orthostasis so recommended compression stockings and discussed importance of changing positions slowly and avoiding dehydration.    Hyperlipidemia Lipid panel in 08/2022: Total Cholesterol 176, Triglycerides 123, HDL 36, LDL 115. LDL goal <55 given history of CVA, stroke, DM. - Previously intolerant to statins.  - Continue Zetia 10mg  daily. - Will refer to Lipid Clinic for consideration of PCSK9 inhibitor. ***   Type 2 Diabetes Mellitus Hemoglobin A1c 9.8% in 02/2023. - On Insulin. - Management per PCP/ Endocrinology.    History of Stroke History of a stroke in 2018. - Has been maintained on Plavix monotherapy. - Intolerant to statins in the past. Continue Zetia 10mg  daily.   EKGs/Labs/Other Studies Reviewed:    The following studies were reviewed:  Echocardiogram 05/22/2022 [facility]): Summary: 1. The left ventricle is normal in size with mildly increased wall  thickness.  2. The left ventricular systolic function is normal, LVEF is visually  estimated at 60-65%.    3. There is grade II diastolic dysfunction (elevated filling pressure).    4. The left atrium is mildly dilated in size.    5. The right ventricle is mildly dilated in size, with normal systolic  function.    6. IVC size and inspiratory change suggest mildly elevated right atrial  pressure.  (5-10 mmHg).  _______________   Right/ Left Cardiac Catheterization 06/19/2022:   Ost LAD to Prox LAD lesion is 30% stenosed.   Mid RCA lesion is 40% stenosed.   RPDA lesion is 90% stenosed.   1.  Widely patent left main with no stenosis 2.  Mild nonobstructive proximal LAD stenosis without any high-grade coronary stenoses throughout the LAD distribution 3.  Patent circumflex with no stenosis 4.  Nonobstructive mid RCA stenosis and severe right PDA stenosis, PDA disease distribution is in a distal portion of the vessel not amenable to PCI 5.  Mild pulmonary hypertension with PA pressure of 55/16 mean 33 mmHg, LVEDP 13 mmHg, mean wedge pressure 19 mmHg   Recommendations: Medical therapy   Diagnostic Dominance: Right   _______________  Cardiac Monitor 08/08/2022 to 09/06/2022: Rhythm:  Sinus rhythm   Occasional PVC (1% total beats), rare PAC.  Few short bursts NSVT, self limited   Longest 8 beats. Triggered event correlated with SR  _______________  Echocardiogram 01/23/2023 ([facility]): Summary: The visually estimated ejection fraction is 65-70%.  No significant valvular abnormalities or other cardiac pathology.    EKG:  EKG not ordered today.   Recent Labs: 12/14/2022: ALT 15 03/19/2023: BUN 31; Creatinine, Ser 1.64; Hemoglobin 12.6; Magnesium 2.0; Platelets 480; Potassium 3.6; Sodium 130  Recent Lipid Panel    Component Value Date/Time   CHOL 176 09/13/2022 1045   CHOL 188 03/21/2022 1540   TRIG 123 09/13/2022 1045   HDL 36 (L) 09/13/2022 1045   HDL 39 (L) 03/21/2022 1540   CHOLHDL 4.9 09/13/2022 1045   VLDL 25 09/13/2022 1045   LDLCALC 115 (H) 09/13/2022 1045   LDLCALC 116 (H) 03/21/2022 1540    Physical Exam:    Vital Signs: There were no vitals taken for this visit.    Wt Readings from Last 3 Encounters:  03/19/23 279 lb 9.6 oz (126.8 kg)  12/14/22 (!) 313 lb (142 kg)  11/16/22 (!) 314 lb (142.4 kg)     General: 53 y.o. female in no acute distress. HEENT:  Normocephalic and atraumatic. Sclera clear. EOMs intact. Neck: Supple. No carotid bruits. No JVD. Heart: *** RRR. Distinct S1 and S2. No murmurs, gallops, or rubs. Radial and distal pedal pulses 2+ and equal bilaterally. Lungs: No increased work of breathing. Clear to ausculation bilaterally. No wheezes, rhonchi, or rales.  Abdomen: Soft, non-distended, and non-tender to palpation. Bowel sounds present in all 4 quadrants.  MSK: Normal strength and tone for age. *** Extremities: No lower extremity edema.    Skin: Warm and dry. Neuro: Alert and oriented x3. No focal deficits. Psych: Normal affect. Responds appropriately.   Assessment:    No diagnosis found.  Plan:     Disposition: Follow up in ***   Medication Adjustments/Labs and Tests Ordered: Current medicines are reviewed at length with the patient today.  Concerns regarding medicines are outlined above.  No orders of the defined types were placed in this encounter.  No orders of the defined types were placed in this encounter.   There are no Patient  Instructions on file for this visit.   Signed, Corrin Parker, PA-C  04/04/2023 8:59 PM    [facility]

## 2023-04-04 NOTE — Telephone Encounter (Signed)
Patient has a oxygen concentrator, said she needs a new one that goes up to 10 liters exertion. She said she has the extra long tubing and is currently on 2 liter resting/sitting.  Case manager is trying to help her get transportation.

## 2023-04-05 ENCOUNTER — Telehealth: Payer: Self-pay | Admitting: Family Medicine

## 2023-04-05 NOTE — Addendum Note (Signed)
Addended by: Julious Payer D on: 04/05/2023 04:22 PM   Modules accepted: Orders

## 2023-04-07 DIAGNOSIS — E1159 Type 2 diabetes mellitus with other circulatory complications: Secondary | ICD-10-CM | POA: Insufficient documentation

## 2023-04-07 DIAGNOSIS — I639 Cerebral infarction, unspecified: Secondary | ICD-10-CM | POA: Insufficient documentation

## 2023-04-07 DIAGNOSIS — I10 Essential (primary) hypertension: Secondary | ICD-10-CM | POA: Insufficient documentation

## 2023-04-07 HISTORY — DX: Cerebral infarction, unspecified: I63.9

## 2023-04-08 ENCOUNTER — Ambulatory Visit: Payer: MEDICAID | Attending: Student | Admitting: Student

## 2023-04-08 DIAGNOSIS — I10 Essential (primary) hypertension: Secondary | ICD-10-CM

## 2023-04-08 DIAGNOSIS — I639 Cerebral infarction, unspecified: Secondary | ICD-10-CM

## 2023-04-08 NOTE — Telephone Encounter (Signed)
Order faxed to [facility] [phone]

## 2023-04-09 ENCOUNTER — Telehealth: Payer: Self-pay | Admitting: Family Medicine

## 2023-04-09 NOTE — Telephone Encounter (Signed)
Left message informing pt that coming in to the office for her HFU is preferred. Instructed to call back if needed.

## 2023-04-10 NOTE — Telephone Encounter (Signed)
PAtient has transportation is coming to her appt.

## 2023-04-11 ENCOUNTER — Inpatient Hospital Stay: Payer: MEDICAID | Admitting: Family Medicine

## 2023-04-15 ENCOUNTER — Other Ambulatory Visit: Payer: Self-pay | Admitting: Family Medicine

## 2023-04-15 ENCOUNTER — Telehealth: Payer: Self-pay | Admitting: Family Medicine

## 2023-04-15 DIAGNOSIS — Z9981 Dependence on supplemental oxygen: Secondary | ICD-10-CM

## 2023-04-15 DIAGNOSIS — I5032 Chronic diastolic (congestive) heart failure: Secondary | ICD-10-CM

## 2023-04-15 DIAGNOSIS — R0902 Hypoxemia: Secondary | ICD-10-CM

## 2023-04-16 NOTE — Telephone Encounter (Signed)
Patient aware. She is trying to get transportation squared away so she can provide a urine sample.

## 2023-04-18 NOTE — Addendum Note (Signed)
Addended by: Neale Burly on: 04/18/2023 11:05 AM   Modules accepted: Orders

## 2023-04-18 NOTE — Telephone Encounter (Signed)
LMTCB

## 2023-04-25 NOTE — Telephone Encounter (Signed)
Patient aware and verbalizes understanding. 

## 2023-05-09 ENCOUNTER — Telehealth: Payer: Self-pay | Admitting: Family Medicine

## 2023-05-09 NOTE — Telephone Encounter (Signed)
Pt wants a POC oxygen tank, NTBS for documentation and a walk test, appt set up for 05/16/23

## 2023-05-16 ENCOUNTER — Ambulatory Visit (INDEPENDENT_AMBULATORY_CARE_PROVIDER_SITE_OTHER): Payer: MEDICAID | Admitting: Family Medicine

## 2023-05-16 ENCOUNTER — Encounter: Payer: Self-pay | Admitting: Family Medicine

## 2023-05-16 VITALS — BP 136/49 | HR 55 | Temp 98.2°F | Ht 66.0 in | Wt 268.8 lb

## 2023-05-16 DIAGNOSIS — I5032 Chronic diastolic (congestive) heart failure: Secondary | ICD-10-CM | POA: Diagnosis not present

## 2023-05-16 DIAGNOSIS — E1169 Type 2 diabetes mellitus with other specified complication: Secondary | ICD-10-CM

## 2023-05-16 DIAGNOSIS — Z7409 Other reduced mobility: Secondary | ICD-10-CM | POA: Insufficient documentation

## 2023-05-16 DIAGNOSIS — N39498 Other specified urinary incontinence: Secondary | ICD-10-CM

## 2023-05-16 DIAGNOSIS — E1159 Type 2 diabetes mellitus with other circulatory complications: Secondary | ICD-10-CM

## 2023-05-16 DIAGNOSIS — J4520 Mild intermittent asthma, uncomplicated: Secondary | ICD-10-CM

## 2023-05-16 DIAGNOSIS — I152 Hypertension secondary to endocrine disorders: Secondary | ICD-10-CM

## 2023-05-16 DIAGNOSIS — J9611 Chronic respiratory failure with hypoxia: Secondary | ICD-10-CM | POA: Diagnosis not present

## 2023-05-16 DIAGNOSIS — Z6841 Body Mass Index (BMI) 40.0 and over, adult: Secondary | ICD-10-CM

## 2023-05-16 DIAGNOSIS — R634 Abnormal weight loss: Secondary | ICD-10-CM

## 2023-05-16 DIAGNOSIS — F419 Anxiety disorder, unspecified: Secondary | ICD-10-CM

## 2023-05-16 DIAGNOSIS — I679 Cerebrovascular disease, unspecified: Secondary | ICD-10-CM

## 2023-05-16 DIAGNOSIS — F331 Major depressive disorder, recurrent, moderate: Secondary | ICD-10-CM

## 2023-05-16 MED ORDER — CETIRIZINE HCL 10 MG PO TABS
10.0000 mg | ORAL_TABLET | Freq: Every day | ORAL | 11 refills | Status: DC
Start: 2023-05-16 — End: 2023-08-08

## 2023-05-16 MED ORDER — BUDESONIDE-FORMOTEROL FUMARATE 160-4.5 MCG/ACT IN AERO
2.0000 | INHALATION_SPRAY | Freq: Every day | RESPIRATORY_TRACT | 0 refills | Status: DC
Start: 2023-05-16 — End: 2023-08-08

## 2023-05-16 MED ORDER — INCRUSE ELLIPTA 62.5 MCG/ACT IN AEPB
1.0000 | INHALATION_SPRAY | Freq: Every day | RESPIRATORY_TRACT | 0 refills | Status: DC
Start: 2023-05-16 — End: 2023-08-08

## 2023-05-16 MED ORDER — OXYBUTYNIN CHLORIDE ER 10 MG PO TB24
10.0000 mg | ORAL_TABLET | Freq: Every day | ORAL | 2 refills | Status: DC
Start: 2023-05-16 — End: 2023-09-30

## 2023-05-16 MED ORDER — MONTELUKAST SODIUM 10 MG PO TABS: 10.0000 mg | ORAL_TABLET | Freq: Every day | ORAL | 5 refills | Status: DC

## 2023-05-16 MED ORDER — LOSARTAN POTASSIUM 100 MG PO TABS
100.0000 mg | ORAL_TABLET | Freq: Every day | ORAL | 3 refills | Status: DC
Start: 2023-05-16 — End: 2023-07-01

## 2023-05-16 MED ORDER — CLOPIDOGREL BISULFATE 75 MG PO TABS: 75.0000 mg | ORAL_TABLET | Freq: Every day | ORAL | 3 refills | Status: DC

## 2023-05-16 MED ORDER — ALBUTEROL SULFATE HFA 108 (90 BASE) MCG/ACT IN AERS
INHALATION_SPRAY | RESPIRATORY_TRACT | 0 refills | Status: AC
Start: 2023-05-16 — End: ?

## 2023-05-16 MED ORDER — METOPROLOL SUCCINATE ER 50 MG PO TB24
50.0000 mg | ORAL_TABLET | Freq: Every day | ORAL | 3 refills | Status: DC
Start: 2023-05-16 — End: 2023-07-01

## 2023-05-16 MED ORDER — TORSEMIDE 20 MG PO TABS: 20.0000 mg | ORAL_TABLET | Freq: Every day | ORAL | 2 refills | Status: DC

## 2023-05-16 NOTE — Progress Notes (Addendum)
Subjective:  Patient ID: Kathryn Jimenez, female    DOB: 1971/07/05, 52 y.o.   MRN: [id number]  Patient Care Team: Arrie Senate, FNP as PCP - General (Family Medicine) Pricilla Riffle, MD as PCP - Cardiology (Cardiology) Dorisann Frames, MD as Consulting Physician (Endocrinology) Malissa Hippo, MD (Inactive) as Consulting Physician (Gastroenterology) Marcelino Duster, MD as Consulting Physician (Dermatology) Case, Helen Hashimoto, MD as Consulting Physician (Orthopedic Surgery)   Chief Complaint:  Medical Management of Chronic Issues   HPI: Kathryn Jimenez is a 52 y.o. female presenting on 05/16/2023 for Medical Management of Chronic Issues She was recently admitted to [facility] and was unable to complete necessary hospital follow ups due to lack of transportation. Patient worked with care coordinator and was able to be transported by EMS to appt today. She was declined by [facility].  She was admitted to [facility] from 03/16/23-03/19/23 from [facility] for acute on chronic hypoxic respiratory failure and acute on chronic diastolic CHF. She received diuresis and oxygen therapy. Per note, she was to follow up with PCP for repeat labs, cardiology, and pulmonology for PFTs.  Since discharging she was not able to make appts for follow up and has required increased oxygen at home, especially with ambulation. In addition, she noticed significant weight loss as she was weighing daily at home to monitor CHF.  Reports a weight loss from 318 lbs in March to 255 lbs in August. She states that she had decreased appetite due to stress. Her husband was also admitted to the hospital and received a poor prognosis. He is now at home and prognosis is reassuring. In addition, lost weight due to diuresis. Reports that her appetite has improved and she has regained 13 lbs.  She is established with endocrinology. States that her BG has remained stable during this period of weight loss. Reports this morning was  164 mg/dL. Reports that it "comes and goes" but she has not had any low BG's. Her A1C during hospitalization in July was 9.8%.  She needs refills on most of her medications.  Now that she is connected with transportation, she plans to follow up with psychiatry, cardiology, and pulmonology. She needs to transfer care to Cardiology in [place] as transportation only serves [employer].  She reports that she has trouble with mobility at home because her rollator does not fit through the doors. She would like to be evaluated for different devices.   Chronic Respiratory Failure   She is currently on 4 Liters of oxygen. Increases oxygen to 6 liters when she is walking or exerting herself at home. She has not been to pulmonology due to lack of transportation. Reports that she is breathing okay. She continues to cough. She states that she has allergy induced symptoms. Her family recently came over to declutter, but her house does not have central heating and air. She believes there is a mold problem there that causes her work of breathing and coughing to increase at home. States that at home her oxygen saturation is >90% unless she is coughing. She is using her medications: incruse, symbicort, albuterol as needed.   Psychiatry  States that she was able to receive a bridge from Dr. Geanie Cooley at [facility] to cover her medications until she  She plans to follow up with them in person now that she has transportation.   Relevant past medical, surgical, family, and social history reviewed and updated as indicated.  Allergies and medications reviewed and updated. Data  reviewed: Chart in Epic.   Past Medical History:  Diagnosis Date   Acute on chronic congestive heart failure (HCC) 03/16/2023   Allergy    Anginal pain (HCC)    Anxiety    Arthritis    Asthma    Bladder disorder, other    Borderline personality disorder (HCC)    CAD (coronary artery disease) 03/16/2023   Carotid artery disease (HCC) 03/05/2017    Depression    Diabetes mellitus without complication (HCC)    GERD (gastroesophageal reflux disease)    Headache    Hyperlipidemia    Hypertension    Kidney stones    Mucous retention cyst of maxillary sinus 08/31/2021   Noted on head CT   Neuromuscular disorder (HCC)    Neuropathy   PTSD (post-traumatic stress disorder)    Shortness of breath    Sleep apnea    Stroke (HCC) 2018   Thyroid disease    nodule    Past Surgical History:  Procedure Laterality Date   CARPAL TUNNEL RELEASE     left 2015   CERVICAL FUSION Bilateral 2003   C 5-6 with titanium screws   CHOLECYSTECTOMY     COLONOSCOPY WITH PROPOFOL N/A 06/24/2014   Procedure: COLONOSCOPY WITH PROPOFOL;  Surgeon: Malissa Hippo, MD;  Location: [facility];  Service: Endoscopy;  Laterality: N/A;  cecum time in 0810    time out   0821    total time 11 minutes   DILITATION & CURRETTAGE/HYSTROSCOPY WITH HYDROTHERMAL ABLATION N/A 04/19/2021   Procedure: DILATATION & CURETTAGE/HYSTEROSCOPY WITH HYDROTHERMAL ABLATION;  Surgeon: Myna Hidalgo, DO;  Location: [facility];  Service: Gynecology;  Laterality: N/A;   INTRAUTERINE DEVICE (IUD) INSERTION N/A 04/19/2021   Procedure: INTRAUTERINE DEVICE (IUD) INSERTION;  Surgeon: Myna Hidalgo, DO;  Location: [facility];  Service: Gynecology;  Laterality: N/A;   POLYPECTOMY N/A 06/24/2014   Procedure: RECTAL POLYPECTOMY;  Surgeon: Malissa Hippo, MD;  Location: [facility];  Service: Endoscopy;  Laterality: N/A;   RIGHT/LEFT HEART CATH AND CORONARY ANGIOGRAPHY N/A 06/19/2022   Procedure: RIGHT/LEFT HEART CATH AND CORONARY ANGIOGRAPHY;  Surgeon: Tonny Bollman, MD;  Location: [facility] INVASIVE CV LAB;  Service: Cardiovascular;  Laterality: N/A;   SPINE SURGERY     URETHRAL DILATION  1981   WISDOM TOOTH EXTRACTION Bilateral 2001 and 1990s   top and bottom     Social History   Socioeconomic History   Marital status: Married    Spouse name: Homero Fellers   Number of children: 0   Years of education: HS   Highest  education level: 12th grade  Occupational History   Occupation: Disabled  Tobacco Use   Smoking status: Never   Smokeless tobacco: Never  Vaping Use   Vaping status: Never Used  Substance and Sexual Activity   Alcohol use: No    Comment: Rarely   Drug use: No   Sexual activity: Not Currently    Birth control/protection: I.U.D.  Other Topics Concern   Not on file  Social History Narrative   Lives at home with husband.   Right-handed.   3-4 cups caffeine per day.   Social Determinants of Health   Financial Resource Strain: Low Risk  (03/16/2023)   Overall Financial Resource Strain (CARDIA)    Difficulty of Paying Living Expenses: Not very hard  Food Insecurity: No Food Insecurity (03/16/2023)   Hunger Vital Sign    Worried About Running Out of Food in the Last Year: Never true    Ran Out  of Food in the Last Year: Never true  Recent Concern: Food Insecurity - Food Insecurity Present (01/23/2023)   [facility]   Hunger Vital Sign    Worried About Running Out of Food in the Last Year: Sometimes true    Ran Out of Food in the Last Year: Sometimes true  Transportation Needs: Unmet Transportation Needs (03/16/2023)   PRAPARE - Transportation    Lack of Transportation (Medical): Yes    Lack of Transportation (Non-Medical): Yes  Physical Activity: Inactive (03/16/2023)   Exercise Vital Sign    Days of Exercise per Week: 0 days    Minutes of Exercise per Session: 0 min  Stress: Stress Concern Present (03/16/2023)   [employer] of Occupational Health - Occupational Stress Questionnaire    Feeling of Stress : To some extent  Social Connections: Socially Isolated (03/16/2023)   Social Connection and Isolation Panel [NHANES]    Frequency of Communication with Friends and Family: More than three times a week    Frequency of Social Gatherings with Friends and Family: Once a week    Attends Religious Services: Never    Database administrator or  Organizations: No    Attends Banker Meetings: Never    Marital Status: Never married  Intimate Partner Violence: Not At Risk (03/16/2023)   Humiliation, Afraid, Rape, and Kick questionnaire    Fear of Current or Ex-Partner: No    Emotionally Abused: No    Physically Abused: No    Sexually Abused: No    Outpatient Encounter Medications as of 05/16/2023  Medication Sig   acetaminophen (TYLENOL) 500 MG tablet Take 500 mg by mouth every 6 (six) hours as needed for mild pain or moderate pain.   albuterol (VENTOLIN HFA) 108 (90 Base) MCG/ACT inhaler inhale two puffs into the lungs every 6 (six) hours as needed for wheezing.   budesonide-formoterol (SYMBICORT) 160-4.5 MCG/ACT inhaler inhale two puffs into the lungs every 12 (twelve) hours.   buPROPion (WELLBUTRIN XL) 150 MG 24 hr tablet Take 150 mg by mouth daily.   cetirizine (ZYRTEC) 10 MG tablet Take 10 mg by mouth daily.   clobetasol cream (TEMOVATE) 0.05 % Apply 1 application topically 2 (two) times daily as needed (scalp psoriasis).   clopidogrel (PLAVIX) 75 MG tablet Take 1 tablet (75 mg total) by mouth daily.   cycloSPORINE (RESTASIS) 0.05 % ophthalmic emulsion Place 1 drop into both eyes 2 (two) times daily.   diphenhydrAMINE (BENADRYL) 25 mg capsule Take 25 mg by mouth every 6 (six) hours as needed for allergies.   ezetimibe (ZETIA) 10 MG tablet Take 1 tablet (10 mg total) by mouth daily.   fluconazole (DIFLUCAN) 150 MG tablet Take one tablet once then repeat in 3 days if needed (Patient taking differently: Take 150 mg by mouth See admin instructions. Take one tablet once then repeat in 3 days if needed when taking antibiotics)   FLUoxetine (PROZAC) 40 MG capsule Take 40 mg by mouth daily.   guaiFENesin (MUCINEX) 600 MG 12 hr tablet Take 1 tablet (600 mg total) by mouth 2 (two) times daily. (Patient taking differently: Take 600 mg by mouth 2 (two) times daily as needed for to loosen phlegm or cough.)   insulin aspart (NOVOLOG  FLEXPEN) 100 UNIT/ML FlexPen Inject 14-20 Units into the skin 3 (three) times daily with meals.   insulin glargine (LANTUS SOLOSTAR) 100 UNIT/ML Solostar Pen Inject 35 Units into the skin at bedtime.   levonorgestrel (  LILETTA, 52 MG,) 20.1 MCG/DAY IUD 1 each by Intrauterine route once. Inserted 04/19/21   loperamide (IMODIUM) 2 MG capsule Take 2 mg by mouth as needed for diarrhea or loose stools.   LORazepam (ATIVAN) 0.5 MG tablet Take 0.25-0.5 mg by mouth daily as needed for anxiety.   losartan (COZAAR) 100 MG tablet Take 100 mg by mouth daily.   metoprolol succinate (TOPROL-XL) 50 MG 24 hr tablet Take 1 tablet (50 mg total) by mouth daily. Take with or immediately following a meal.   montelukast (SINGULAIR) 10 MG tablet take one tablet (10 milligram dose) by mouth at bedtime.   nystatin (MYCOSTATIN/NYSTOP) powder Apply 1 application  topically daily as needed (for fungal infections).   oxybutynin (DITROPAN-XL) 10 MG 24 hr tablet take 1 tablet (10 MILLIGRAM total) by mouth at bedtime.   OXYGEN Inhale 2 L/min into the lungs continuous.   potassium chloride (KLOR-CON) 10 MEQ tablet Take 2 tablets (20 mEq total) by mouth daily.   terconazole (TERAZOL 3) 0.8 % vaginal cream Place 1 applicator vaginally at bedtime.   torsemide (DEMADEX) 20 MG tablet Take 1 tablet (20 mg total) by mouth daily. Can take extra tablet with weight gain greater than 3 lb in 24 hours   umeclidinium bromide (INCRUSE ELLIPTA) 62.5 MCG/ACT AEPB inhale one puff into the lungs daily.   valACYclovir (VALTREX) 500 MG tablet Take 1 tablet (500 mg total) by mouth daily.   No facility-administered encounter medications on file as of 05/16/2023.   Allergies  Allergen Reactions   Diphenhydramine Hcl Itching   Actos [Pioglitazone] Swelling   Augmentin [Amoxicillin-Pot Clavulanate] Diarrhea, Nausea Only and Other (See Comments)    Caused severe diarrhea   Citrus Other (See Comments)    "Issues with my tongue"- the fleshy part of the  fruit    Invokana [Canagliflozin] Other (See Comments)    Yeast infection   Lopid [Gemfibrozil] Other (See Comments)    Made the tongue burn   Metoprolol Other (See Comments)    Headaches    Naproxen Sodium Other (See Comments)    Patient states she "felt loopy"   Penicillins Diarrhea and Other (See Comments)    Violent diarrhea- must have Diflucan   Pineapple Other (See Comments)    "Issues with my tongue"   Statins Other (See Comments)    Joint pain   Tape Itching and Other (See Comments)    Tape, EKG leads, and Band-Aids - Itching and redness   Amlodipine Cough   Clavulanic Acid Nausea Only   Hctz [Hydrochlorothiazide] Rash   Lisinopril Cough   Morphine And Codeine Itching and Rash    Review of Systems As per HPI  Objective:  BP (!) 114/39   Pulse (!) 55   Temp 98.2 F (36.8 C)   Ht 5\' 6"  (1.676 m)   Wt 258 lb 12.8 oz (117.4 kg)   SpO2 98%   BMI 41.77 kg/m    O2 walk test:  Sitting 2 Liters [place] 96%  Walking 4 Liters [place] 76%  Walking 6 Liters [place] 90%   Wt Readings from Last 3 Encounters:  05/16/23 258 lb 12.8 oz (117.4 kg)  03/19/23 279 lb 9.6 oz (126.8 kg)  12/14/22 (!) 313 lb (142 kg)   Physical Exam Constitutional:      General: She is awake. She is not in acute distress.    Appearance: Normal appearance. She is well-developed and well-groomed. She is morbidly obese. She is not ill-appearing, toxic-appearing or diaphoretic.  HENT:     Head:     Comments: [place] in place  Eyes:     Comments: Wears glasses   Cardiovascular:     Rate and Rhythm: Regular rhythm. Bradycardia present.     Pulses: Normal pulses.          Radial pulses are 2+ on the right side and 2+ on the left side.       Posterior tibial pulses are 2+ on the right side and 2+ on the left side.     Heart sounds: Normal heart sounds. No murmur heard.    No gallop.     Comments: Nonpitting edema  Pulmonary:     Effort: Pulmonary effort is normal. No respiratory distress.     Breath sounds:  Normal breath sounds. No stridor. No wheezing, rhonchi or rales.     Comments: DOE, unable to walk long distances Musculoskeletal:     Cervical back: Full passive range of motion without pain and neck supple.     Right lower leg: Edema present.     Left lower leg: Edema present.     Comments: Generalized weakness   Skin:    General: Skin is warm.     Capillary Refill: Capillary refill takes less than 2 seconds.  Neurological:     General: No focal deficit present.     Mental Status: She is alert, oriented to person, place, and time and easily aroused. Mental status is at baseline.     GCS: GCS eye subscore is 4. GCS verbal subscore is 5. GCS motor subscore is 6.     Motor: No weakness.  Psychiatric:        Attention and Perception: Attention and perception normal.        Mood and Affect: Mood and affect normal.        Speech: Speech normal.        Behavior: Behavior normal. Behavior is cooperative.        Thought Content: Thought content normal. Thought content does not include homicidal or suicidal ideation. Thought content does not include homicidal or suicidal plan.        Cognition and Memory: Cognition and memory normal.        Judgment: Judgment normal.     Results for orders placed or performed during the hospital encounter of 03/16/23  Glucose, capillary  Result Value Ref Range   Glucose-Capillary 379 (H) 70 - 99 mg/dL  CBC  Result Value Ref Range   WBC 12.6 (H) 4.0 - 10.5 K/uL   RBC 5.01 3.87 - 5.11 MIL/uL   Hemoglobin 13.9 12.0 - 15.0 g/dL   HCT 13.0 86.5 - 78.4 %   MCV 85.4 80.0 - 100.0 fL   MCH 27.7 26.0 - 34.0 pg   MCHC 32.5 30.0 - 36.0 g/dL   RDW 69.6 29.5 - 28.4 %   Platelets 578 (H) 150 - 400 K/uL   nRBC 0.0 0.0 - 0.2 %  Basic metabolic panel  Result Value Ref Range   Sodium 132 (L) 135 - 145 mmol/L   Potassium 3.6 3.5 - 5.1 mmol/L   Chloride 89 (L) 98 - 111 mmol/L   CO2 32 22 - 32 mmol/L   Glucose, Bld 288 (H) 70 - 99 mg/dL   BUN 18 6 - 20 mg/dL    Creatinine, Ser 1.32 0.44 - 1.00 mg/dL   Calcium 9.2 8.9 - 44.0 mg/dL   GFR, Estimated >10 >27 mL/min   Anion gap 11  5 - 15  CBC  Result Value Ref Range   WBC 13.1 (H) 4.0 - 10.5 K/uL   RBC 4.78 3.87 - 5.11 MIL/uL   Hemoglobin 13.2 12.0 - 15.0 g/dL   HCT 63.8 75.6 - 43.3 %   MCV 83.5 80.0 - 100.0 fL   MCH 27.6 26.0 - 34.0 pg   MCHC 33.1 30.0 - 36.0 g/dL   RDW 29.5 18.8 - 41.6 %   Platelets 532 (H) 150 - 400 K/uL   nRBC 0.0 0.0 - 0.2 %  Basic metabolic panel  Result Value Ref Range   Sodium 129 (L) 135 - 145 mmol/L   Potassium 2.7 (LL) 3.5 - 5.1 mmol/L   Chloride 87 (L) 98 - 111 mmol/L   CO2 30 22 - 32 mmol/L   Glucose, Bld 381 (H) 70 - 99 mg/dL   BUN 19 6 - 20 mg/dL   Creatinine, Ser 6.06 0.44 - 1.00 mg/dL   Calcium 8.9 8.9 - 30.1 mg/dL   GFR, Estimated >60 >10 mL/min   Anion gap 12 5 - 15  Glucose, capillary  Result Value Ref Range   Glucose-Capillary 308 (H) 70 - 99 mg/dL  Glucose, capillary  Result Value Ref Range   Glucose-Capillary 281 (H) 70 - 99 mg/dL  Glucose, capillary  Result Value Ref Range   Glucose-Capillary 226 (H) 70 - 99 mg/dL  Basic metabolic panel  Result Value Ref Range   Sodium 130 (L) 135 - 145 mmol/L   Potassium 3.0 (L) 3.5 - 5.1 mmol/L   Chloride 87 (L) 98 - 111 mmol/L   CO2 31 22 - 32 mmol/L   Glucose, Bld 301 (H) 70 - 99 mg/dL   BUN 28 (H) 6 - 20 mg/dL   Creatinine, Ser 9.32 (H) 0.44 - 1.00 mg/dL   Calcium 9.0 8.9 - 35.5 mg/dL   GFR, Estimated 46 (L) >60 mL/min   Anion gap 12 5 - 15  CBC  Result Value Ref Range   WBC 9.9 4.0 - 10.5 K/uL   RBC 4.53 3.87 - 5.11 MIL/uL   Hemoglobin 13.1 12.0 - 15.0 g/dL   HCT 73.2 20.2 - 54.2 %   MCV 89.0 80.0 - 100.0 fL   MCH 28.9 26.0 - 34.0 pg   MCHC 32.5 30.0 - 36.0 g/dL   RDW 70.6 23.7 - 62.8 %   Platelets 498 (H) 150 - 400 K/uL   nRBC 0.0 0.0 - 0.2 %  Magnesium  Result Value Ref Range   Magnesium 1.8 1.7 - 2.4 mg/dL  Glucose, capillary  Result Value Ref Range   Glucose-Capillary 270 (H) 70  - 99 mg/dL  Glucose, capillary  Result Value Ref Range   Glucose-Capillary 296 (H) 70 - 99 mg/dL  Glucose, capillary  Result Value Ref Range   Glucose-Capillary 343 (H) 70 - 99 mg/dL  Basic metabolic panel  Result Value Ref Range   Sodium 130 (L) 135 - 145 mmol/L   Potassium 3.6 3.5 - 5.1 mmol/L   Chloride 90 (L) 98 - 111 mmol/L   CO2 31 22 - 32 mmol/L   Glucose, Bld 265 (H) 70 - 99 mg/dL   BUN 31 (H) 6 - 20 mg/dL   Creatinine, Ser 3.15 (H) 0.44 - 1.00 mg/dL   Calcium 9.2 8.9 - 17.6 mg/dL   GFR, Estimated 38 (L) >60 mL/min   Anion gap 9 5 - 15  CBC  Result Value Ref Range   WBC 8.7 4.0 - 10.5 K/uL  RBC 4.44 3.87 - 5.11 MIL/uL   Hemoglobin 12.6 12.0 - 15.0 g/dL   HCT 16.1 09.6 - 04.5 %   MCV 87.6 80.0 - 100.0 fL   MCH 28.4 26.0 - 34.0 pg   MCHC 32.4 30.0 - 36.0 g/dL   RDW 40.9 81.1 - 91.4 %   Platelets 480 (H) 150 - 400 K/uL   nRBC 0.0 0.0 - 0.2 %  Magnesium  Result Value Ref Range   Magnesium 2.0 1.7 - 2.4 mg/dL  Glucose, capillary  Result Value Ref Range   Glucose-Capillary 308 (H) 70 - 99 mg/dL  Glucose, capillary  Result Value Ref Range   Glucose-Capillary 336 (H) 70 - 99 mg/dL  Glucose, capillary  Result Value Ref Range   Glucose-Capillary 259 (H) 70 - 99 mg/dL       7/82/9562   13:08 AM 03/16/2023   10:47 PM 12/14/2022    3:34 PM 10/23/2022   10:54 AM 07/20/2022   11:28 AM  Depression screen PHQ 2/9  Decreased Interest 1 0 1 1 1   Down, Depressed, Hopeless 1 0 1 1 1   PHQ - 2 Score 2 0 2 2 2   Altered sleeping 1  1 1 1   Tired, decreased energy 1  1 1 1   Change in appetite 1  1 1 1   Feeling bad or failure about yourself  1  1 1 1   Trouble concentrating 1  1 1 1   Moving slowly or fidgety/restless 1  1 1 1   Suicidal thoughts 0  0 0 0  PHQ-9 Score 8  8 8 8   Difficult doing work/chores Somewhat difficult  Somewhat difficult Somewhat difficult        05/16/2023   10:24 AM 12/14/2022    3:34 PM 10/23/2022   10:55 AM 07/20/2022   11:28 AM  GAD 7 : Generalized  Anxiety Score  Nervous, Anxious, on Edge 1 1 1 1   Control/stop worrying 1 1 1 1   Worry too much - different things 1 1 1 1   Trouble relaxing 1 1 2 1   Restless 1 1 1 1   Easily annoyed or irritable 1 1 1 2   Afraid - awful might happen 1 1 1 1   Total GAD 7 Score 7 7 8 8   Anxiety Difficulty Somewhat difficult Somewhat difficult Somewhat difficult     Pertinent labs & imaging results that were available during my care of the patient were reviewed by me and considered in my medical decision making.  Assessment & Plan:  Elanda was seen today for medical management of chronic issues.  Diagnoses and all orders for this visit:  Chronic hypoxic respiratory failure (HCC) Walk test results below. Stable symptoms. Recommend continue 2-4 Liters of oxygen at rest, 6 Liters with ambulation. Refill on medications as below. Patient to follow up with pulmonology. Referral previously placed. Will continue medications to reduce allergy-mediated exacerbations. Reviewed H&P from Tu, DO on 03/16/23. Reviewed discharge note from [facility], MD 03/19/23.  -     umeclidinium bromide (INCRUSE ELLIPTA) 62.5 MCG/ACT AEPB; Inhale 1 puff into the lungs daily. -     budesonide-formoterol (SYMBICORT) 160-4.5 MCG/ACT inhaler; Inhale 2 puffs into the lungs daily. -     albuterol (VENTOLIN HFA) 108 (90 Base) MCG/ACT inhaler; inhale two puffs into the lungs every 6 (six) hours as needed for wheezing. -     montelukast (SINGULAIR) 10 MG tablet; Take 1 tablet (10 mg total) by mouth at bedtime.  O2 walk test:  Sitting 2  Liters [place] 96%  Walking 4 Liters [place] 76%  Walking 6 Liters [place] 90%  Chronic heart failure with preserved ejection fraction (HFpEF) (HCC) Referral placed as below for patient to switch to [facility] location.  Continue current regimen. Continue to monitor weight and edema at home.  -     Ambulatory referral to Cardiology -     torsemide (DEMADEX) 20 MG tablet; Take 1-2 tablets (20-40 mg total) by mouth daily. Can take extra  tablet with weight gain greater than 3 lb in 24 hours  Hypertension associated with type 2 diabetes mellitus (HCC) Well controlled. Continue current medications. Recommend patient keep a log of BP at home and bring to follow up appts.  -     losartan (COZAAR) 100 MG tablet; Take 1 tablet (100 mg total) by mouth daily. -     metoprolol succinate (TOPROL-XL) 50 MG 24 hr tablet; Take 1 tablet (50 mg total) by mouth daily. Take with or immediately following a meal.  Morbid obesity with BMI of 40.0-44.9, adult (HCC) Patient to continue to work on healthy weight loss. Recommend patient keep a log of daily weights and bring to follow up appts. Will check labs as below as patient had significant shift in weight and fluid volume status.   Weight loss As above  -     Magnesium -     CBC with Differential/Platelet -     CMP14+EGFR -     Thyroid Panel With TSH -     Vitamin B12 -     VITAMIN D 25 Hydroxy (Vit-D Deficiency, Fractures)  Other urinary incontinence Well controlled. Refill placed.  -     oxybutynin (DITROPAN-XL) 10 MG 24 hr tablet; Take 1 tablet (10 mg total) by mouth at bedtime.  Impaired mobility Referral placed as below for patient to be evaluated by PT/OT to determine need for assistive devices.  -     Home Health  Mild intermittent asthma without complication Refills provided as below. Provided refills on zyrtec and singulair to assist with reducing allergy-mediated exacerbations. Patient is to follow up with pulmonology for PFTs. Referral previously placed.  -     albuterol (VENTOLIN HFA) 108 (90 Base) MCG/ACT inhaler; inhale two puffs into the lungs every 6 (six) hours as needed for wheezing. -     cetirizine (ZYRTEC) 10 MG tablet; Take 1 tablet (10 mg total) by mouth daily. -     montelukast (SINGULAIR) 10 MG tablet; Take 1 tablet (10 mg total) by mouth at bedtime.  Small vessel disease, cerebrovascular Refill provided as below. Patient to follow up with Cardiology.  -      clopidogrel (PLAVIX) 75 MG tablet; Take 1 tablet (75 mg total) by mouth daily.  Hyperlipidemia associated with type 2 diabetes mellitus (HCC) Labs as below. Will communicate results to patient once available. Will await results to determine next steps.  Nonfasting  -     Lipid panel  Moderate episode of recurrent major depressive disorder (HCC) Well controlled. Established with Psychiatry. Patient to follow up with [facility].   Anxiety As above.   Continue all other maintenance medications.  Follow up plan: Return in about 3 months (around 08/15/2023) for Chronic Condition Follow up.   Continue healthy lifestyle choices, including diet (rich in fruits, vegetables, and lean proteins, and low in salt and simple carbohydrates) and exercise (at least 30 minutes of moderate physical activity daily).  Written and verbal instructions provided   The above assessment and management plan  was discussed with the patient. The patient verbalized understanding of and has agreed to the management plan. Patient is aware to call the clinic if they develop any new symptoms or if symptoms persist or worsen. Patient is aware when to return to the clinic for a follow-up visit. Patient educated on when it is appropriate to go to the emergency department.   Neale Burly, DNP-FNP [facility] [address] [place], [place] [address] [phone]

## 2023-05-17 LAB — CMP14+EGFR
ALT: 6 [IU]/L (ref 0–32)
AST: 11 [IU]/L (ref 0–40)
Albumin: 3.6 g/dL — ABNORMAL LOW (ref 3.9–4.9)
Alkaline Phosphatase: 124 [IU]/L — ABNORMAL HIGH (ref 44–121)
BUN/Creatinine Ratio: 8 — ABNORMAL LOW (ref 9–23)
BUN: 5 mg/dL — ABNORMAL LOW (ref 6–24)
Bilirubin Total: 0.3 mg/dL (ref 0.0–1.2)
CO2: 32 mmol/L — ABNORMAL HIGH (ref 20–29)
Calcium: 9.5 mg/dL (ref 8.7–10.2)
Chloride: 95 mmol/L — ABNORMAL LOW (ref 96–106)
Creatinine, Ser: 0.66 mg/dL (ref 0.57–1.00)
Globulin, Total: 3.5 g/dL (ref 1.5–4.5)
Glucose: 141 mg/dL — ABNORMAL HIGH (ref 70–99)
Potassium: 3.9 mmol/L (ref 3.5–5.2)
Sodium: 138 mmol/L (ref 134–144)
Total Protein: 7.1 g/dL (ref 6.0–8.5)
eGFR: 107 mL/min/{1.73_m2} (ref >59)

## 2023-05-17 LAB — THYROID PANEL WITH TSH
Free Thyroxine Index: 1.8 (ref 1.2–4.9)
T3 Uptake Ratio: 29 % (ref 24–39)
T4, Total: 6.3 ug/dL (ref 4.5–12.0)
TSH: 1.91 u[IU]/mL (ref 0.450–4.500)

## 2023-05-17 LAB — CBC WITH DIFFERENTIAL/PLATELET
Basophils Absolute: 0.1 10*3/uL (ref 0.0–0.2)
Basos: 1 %
EOS (ABSOLUTE): 0.2 10*3/uL (ref 0.0–0.4)
Eos: 2 %
Hematocrit: 37.6 % (ref 34.0–46.6)
Hemoglobin: 12.1 g/dL (ref 11.1–15.9)
Immature Grans (Abs): 0 10*3/uL (ref 0.0–0.1)
Immature Granulocytes: 0 %
Lymphocytes Absolute: 3.6 10*3/uL — ABNORMAL HIGH (ref 0.7–3.1)
Lymphs: 40 %
MCH: 28.3 pg (ref 26.6–33.0)
MCHC: 32.2 g/dL (ref 31.5–35.7)
MCV: 88 fL (ref 79–97)
Monocytes Absolute: 0.5 10*3/uL (ref 0.1–0.9)
Monocytes: 6 %
Neutrophils Absolute: 4.6 10*3/uL (ref 1.4–7.0)
Neutrophils: 51 %
Platelets: 396 10*3/uL (ref 150–450)
RBC: 4.27 x10E6/uL (ref 3.77–5.28)
RDW: 15.1 % (ref 11.7–15.4)
WBC: 9 10*3/uL (ref 3.4–10.8)

## 2023-05-17 LAB — LIPID PANEL
Chol/HDL Ratio: 5.4 {ratio} — ABNORMAL HIGH (ref 0.0–4.4)
Cholesterol, Total: 212 mg/dL — ABNORMAL HIGH (ref 100–199)
HDL: 39 mg/dL — ABNORMAL LOW (ref >39)
LDL Chol Calc (NIH): 153 mg/dL — ABNORMAL HIGH (ref 0–99)
Triglycerides: 108 mg/dL (ref 0–149)
VLDL Cholesterol Cal: 20 mg/dL (ref 5–40)

## 2023-05-17 LAB — VITAMIN D 25 HYDROXY (VIT D DEFICIENCY, FRACTURES): Vit D, 25-Hydroxy: 22.8 ng/mL — ABNORMAL LOW (ref 30.0–100.0)

## 2023-05-17 LAB — VITAMIN B12: Vitamin B-12: 255 pg/mL (ref 232–1245)

## 2023-05-17 LAB — MAGNESIUM: Magnesium: 1.8 mg/dL (ref 1.6–2.3)

## 2023-05-17 NOTE — Progress Notes (Signed)
Slightly elevated lymphocytes, not concerning at this time. CMP with signs of dehydration and low protein. Kidney function looks great. Slightly elevated alk phos, may be due to nonfasting status.  Will follow up on labs in the future.  Cholesterol is elevated. Diet encouraged - increase intake of fresh fruits and vegetables, increase intake of lean proteins. Bake, broil, or grill foods. Avoid fried, greasy, and fatty foods. Avoid fast foods. Increase intake of fiber-rich whole grains. Exercise encouraged - at least 150 minutes per week and advance as tolerated. Given patient has had prior MI, recommend that she restart zetia.  Slightly decreased vitamin D. Recommend patient take 1000-2000 international units of vitamin d daily.  All other labs normal.

## 2023-05-29 ENCOUNTER — Other Ambulatory Visit: Payer: Self-pay | Admitting: Family Medicine

## 2023-05-29 ENCOUNTER — Telehealth: Payer: Self-pay | Admitting: Family Medicine

## 2023-05-29 DIAGNOSIS — I5032 Chronic diastolic (congestive) heart failure: Secondary | ICD-10-CM

## 2023-05-29 DIAGNOSIS — J9611 Chronic respiratory failure with hypoxia: Secondary | ICD-10-CM

## 2023-05-30 NOTE — Telephone Encounter (Signed)
Order faxed to [employer] [facility] at [phone]

## 2023-05-30 NOTE — Telephone Encounter (Signed)
Oxygen order placed

## 2023-06-10 NOTE — Telephone Encounter (Signed)
Informed pt I reached out to [facility] they will be reaching out to her as to how they can help her, if they need to change out the POC that she already has or what.

## 2023-06-17 NOTE — Progress Notes (Signed)
Cardiology Office Note:  .   Date:  07/01/2023  ID:  Tiasia Suell, DOB 1971/03/19, MRN [id number] PCP: Arrie Senate, FNP  [facility] Providers Cardiologist:  Dietrich Pates, MD    History of Present Illness: .   Kathryn Jimenez is a 52 y.o. female with a history of CAD.  Cath in Oct 2023 showed high graded RPDA  not amenable to PCI; mild nonobstructive CAD elsewhere.Pt also with hx of HFpEF, SVT, HTN, T2DM, HL, CVA (2018) morbid obesity, PTSD.  Admission [facility] 01/2023 with CHF and had pulmonology consult with extensive workup with for autoimmune interstitial lung disease which was all negative. Rheumatoid factor was slightly elevated at 16 however patient had no joint pains or other symptoms of inflammatory arthritis and it was felt that this diagnosis was extremely unlikely.   Discharged 02/2023 with CHF/pneumonia.Echo EF 65-70% normal wall motion and diastolic function. ? Mold in home.  Patient here for f/u-transport via [facility]. Denies chest pain. On O2 2L at rest but 4-5L when moving around, no edema. She's lost weight but has also gained some back. Under a lot of stress, eating disorder. No palpitations. Takes an extra demadex once or twice a week if she swells in the [place].  ROS:    Studies Reviewed: Marland Kitchen         Prior CV Studies:   Echocardiogram 05/22/2022 [facility]): Summary: 1. The left ventricle is normal in size with mildly increased wall  thickness.    2. The left ventricular systolic function is normal, LVEF is visually  estimated at 60-65%.    3. There is grade II diastolic dysfunction (elevated filling pressure).    4. The left atrium is mildly dilated in size.    5. The right ventricle is mildly dilated in size, with normal systolic  function.    6. IVC size and inspiratory change suggest mildly elevated right atrial  pressure. (5-10 mmHg).  _______________   Right/ Left Cardiac Catheterization 06/19/2022:   Ost LAD to Prox LAD lesion is 30%  stenosed.   Mid RCA lesion is 40% stenosed.   RPDA lesion is 90% stenosed.   1.  Widely patent left main with no stenosis 2.  Mild nonobstructive proximal LAD stenosis without any high-grade coronary stenoses throughout the LAD distribution 3.  Patent circumflex with no stenosis 4.  Nonobstructive mid RCA stenosis and severe right PDA stenosis, PDA disease distribution is in a distal portion of the vessel not amenable to PCI 5.  Mild pulmonary hypertension with PA pressure of 55/16 mean 33 mmHg, LVEDP 13 mmHg, mean wedge pressure 19 mmHg   Recommendations: Medical therapy    Risk Assessment/Calculations:            Physical Exam:   VS:  BP (!) 142/72   Pulse 76   Ht 5\' 6"  (1.676 m)   Wt 279 lb (126.6 kg)   SpO2 96%   BMI 45.03 kg/m    Wt Readings from Last 3 Encounters:  07/01/23 279 lb (126.6 kg)  05/16/23 268 lb 12.8 oz (121.9 kg)  03/19/23 279 lb 9.6 oz (126.8 kg)    GEN: Well nourished, well developed in no acute distress NECK: No JVD; No carotid bruits CARDIAC:  RRR, no murmurs, rubs, gallops RESPIRATORY:  Clear to auscultation without rales, wheezing or rhonchi  ABDOMEN: Soft, non-tender, non-distended EXTREMITIES:  No edema; No deformity   ASSESSMENT AND PLAN: .     CAD high grade RPDA not amenable to  PCI, mild nonobstructive CAD elsewhere on cath 05/2022-no angina. Continue plavix and current meds.  HFpEF-compensated on demadex daily and takes an extra once or twice a week.   SVT-no symptoms on metoprolol  HTN-controlled on losartan, metoprolol  HLD-LDL 153 04/2023, statin intolerant and not taking zetia. Goal LDL is less than 55. She doesn't agree with taking cholesterol lowering meds as she thinks she's had a side effect but can't recall what. I've asked her to start Zetia as prescribed by PCP.  Morbid obesity BMI >44-has lost some but also regained.   DM2 on insulin A1C 8.7 10/2022.        Dispo: f/u in 1 yr.  Signed, Jacolyn Reedy, PA-C

## 2023-06-18 ENCOUNTER — Telehealth: Payer: Self-pay

## 2023-06-21 ENCOUNTER — Telehealth: Payer: Self-pay | Admitting: *Deleted

## 2023-06-21 ENCOUNTER — Other Ambulatory Visit: Payer: Self-pay | Admitting: Family Medicine

## 2023-06-21 ENCOUNTER — Other Ambulatory Visit: Payer: Self-pay | Admitting: Obstetrics & Gynecology

## 2023-06-21 DIAGNOSIS — N760 Acute vaginitis: Secondary | ICD-10-CM

## 2023-06-21 MED ORDER — FLUCONAZOLE 150 MG PO TABS
ORAL_TABLET | ORAL | 3 refills | Status: DC
Start: 2023-06-21 — End: 2023-08-08

## 2023-06-21 NOTE — Progress Notes (Signed)
Refill on fluconazole  Myna Hidalgo, DO Attending Obstetrician & Gynecologist, [facility] for [employer], [facility]

## 2023-06-21 NOTE — Telephone Encounter (Signed)
Pt requesting refill on diflucan.

## 2023-06-27 NOTE — Telephone Encounter (Signed)
Last prescribed by Deliah Boston 03/2022

## 2023-06-28 NOTE — Telephone Encounter (Unsigned)
Copied from CRM [id number]. Topic: Clinical - Medication Question >> Jun 28, 2023 12:39 PM Desma Mcgregor wrote: Reason for CRM: Pt calling in regarding her Potassium medication. Explained that an appt is needed for the med to be added back to med list. Pt is now requesting a follow up call from Dr. Ellamae Sia to understand why an appt is needed after she seen the dr. back on 05/16/2023. Cb# [phone]

## 2023-06-30 IMAGING — US US SOFT TISSUE HEAD/NECK
1 series · 8 of 8 positions shown · non-contrast
Comparison: None Available.

CLINICAL DATA: Posterior neck lump for 1 year.  Pain for 3 days.

EXAM:
ULTRASOUND OF HEAD/NECK SOFT TISSUES
TECHNIQUE: Ultrasound examination of the head and neck soft tissues was
performed in the area of clinical concern.

[Series 1: us soft tissue head & neck (non-thyroid) · 8 of 8 slices shown]
[im 1/8]
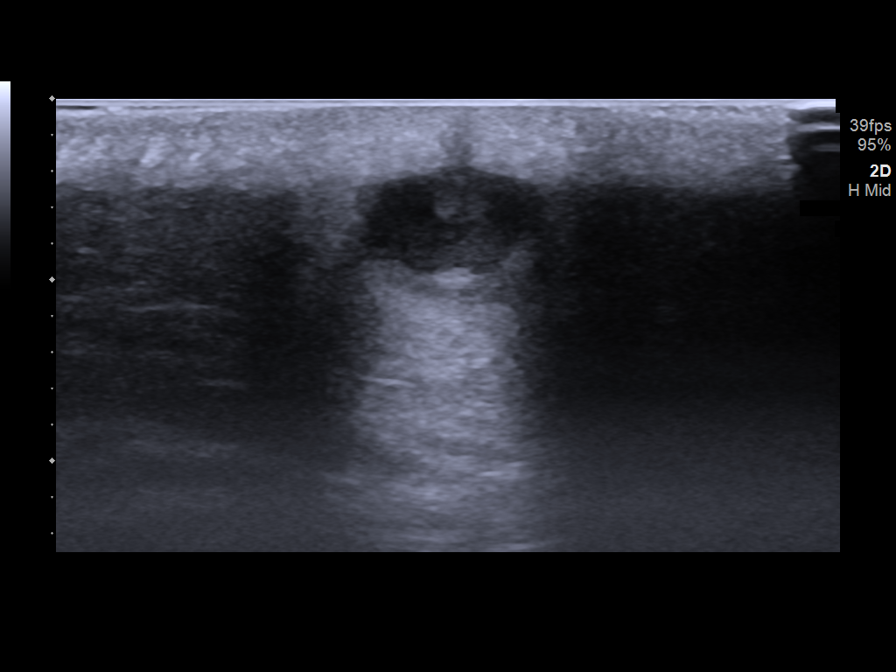
[im 2/8]
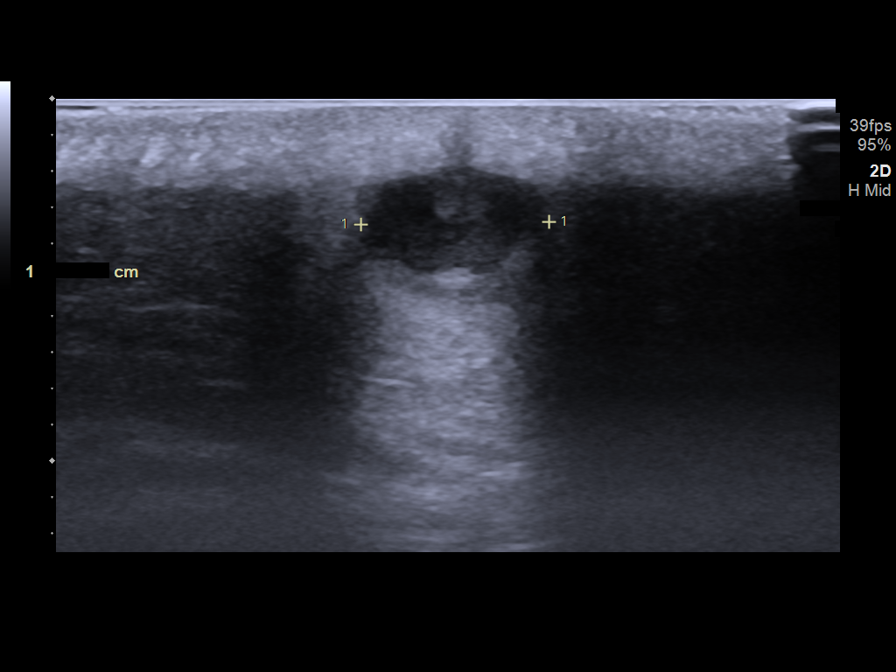
[im 3/8]
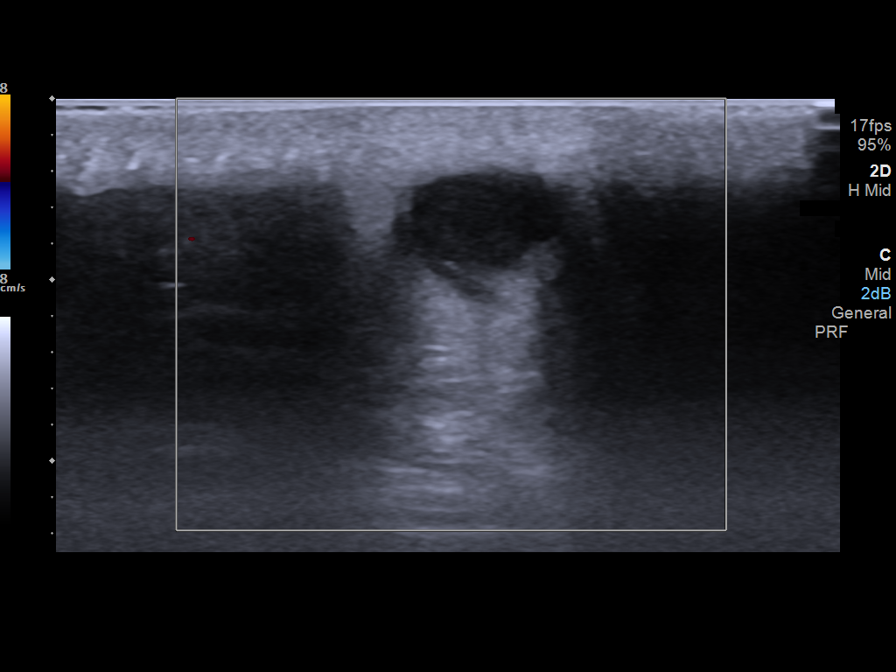
[im 4/8]
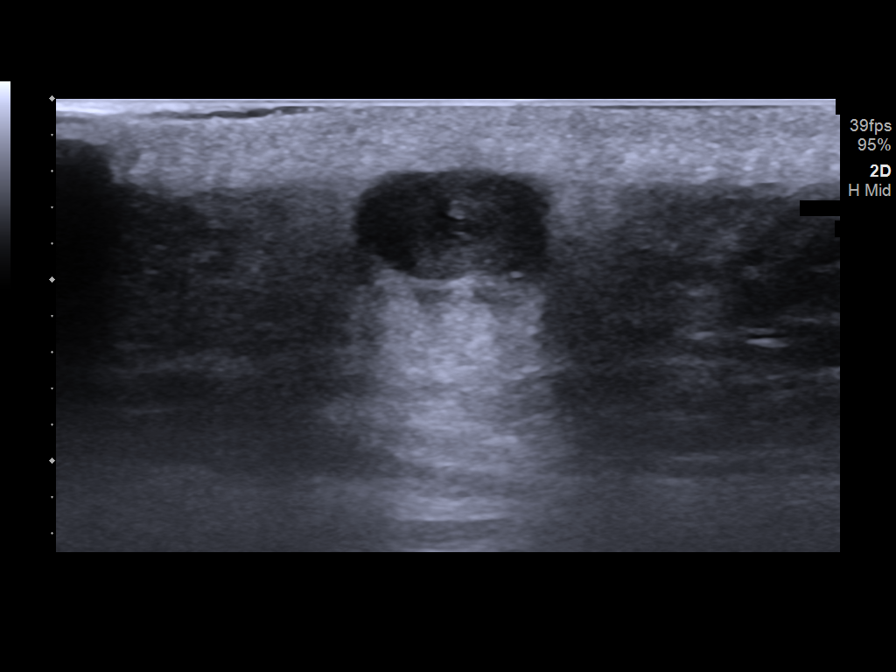
[im 5/8]
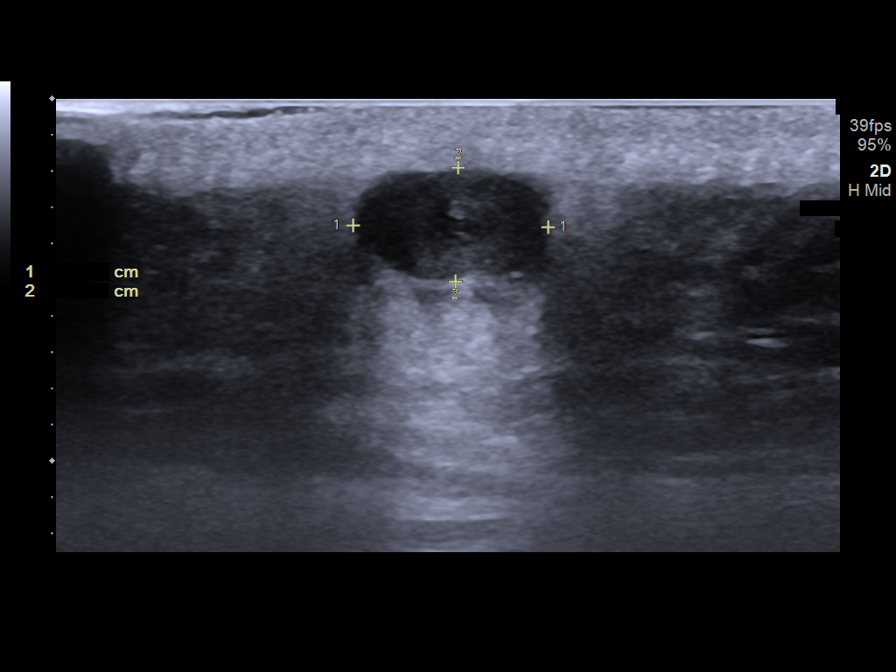
[im 6/8]
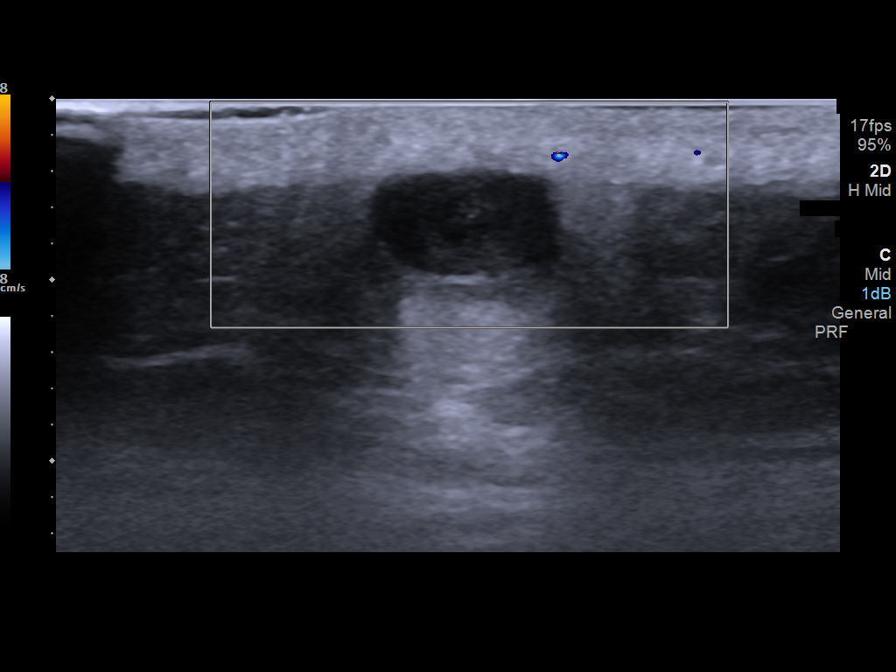
[im 7/8]
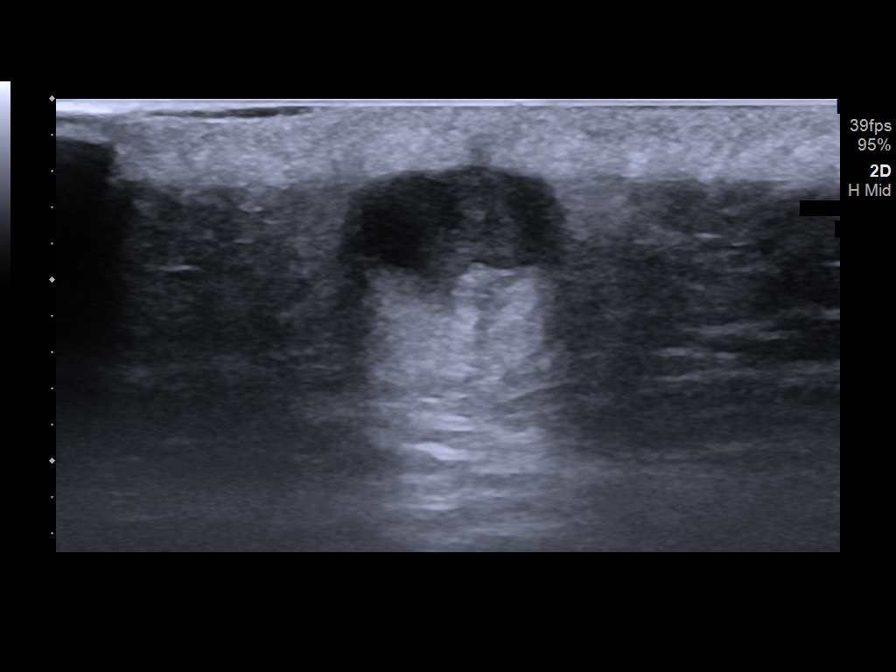
[im 8/8]
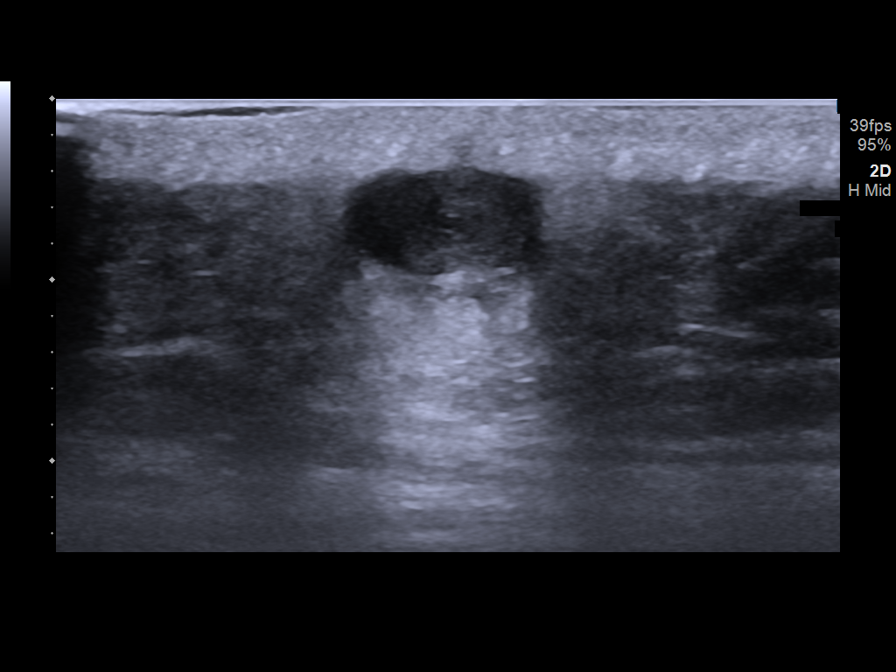

[8 of 8 positions shown; findings below may reference images not displayed]

FINDINGS: In the midline of the posterior upper neck is an 11 x 6 x 10 mm
superficial subcutaneous mass. The mass is heterogeneously
hypoechoic with posterior acoustic enhancement and no internal
vascularity on color Doppler imaging. A small tract is questioned
extending towards the skin surface.
IMPRESSION: 11 mm subcutaneous complex cystic mass in the posterior upper neck,
possibly an epidermal inclusion cyst.

## 2023-07-01 ENCOUNTER — Encounter: Payer: Self-pay | Admitting: Physician Assistant

## 2023-07-01 ENCOUNTER — Ambulatory Visit: Payer: MEDICAID | Attending: Physician Assistant | Admitting: Physician Assistant

## 2023-07-01 VITALS — BP 142/72 | HR 76 | Ht 66.0 in | Wt 279.0 lb

## 2023-07-01 DIAGNOSIS — I5032 Chronic diastolic (congestive) heart failure: Secondary | ICD-10-CM | POA: Diagnosis not present

## 2023-07-01 DIAGNOSIS — I471 Supraventricular tachycardia, unspecified: Secondary | ICD-10-CM

## 2023-07-01 DIAGNOSIS — Z6841 Body Mass Index (BMI) 40.0 and over, adult: Secondary | ICD-10-CM

## 2023-07-01 DIAGNOSIS — I2510 Atherosclerotic heart disease of native coronary artery without angina pectoris: Secondary | ICD-10-CM | POA: Diagnosis not present

## 2023-07-01 DIAGNOSIS — I10 Essential (primary) hypertension: Secondary | ICD-10-CM | POA: Diagnosis not present

## 2023-07-01 DIAGNOSIS — E785 Hyperlipidemia, unspecified: Secondary | ICD-10-CM

## 2023-07-01 MED ORDER — CLOPIDOGREL BISULFATE 75 MG PO TABS: 75.0000 mg | ORAL_TABLET | Freq: Every day | ORAL | 3 refills | Status: DC

## 2023-07-01 MED ORDER — TORSEMIDE 20 MG PO TABS: 20.0000 mg | ORAL_TABLET | Freq: Every day | ORAL | 3 refills | Status: DC

## 2023-07-01 MED ORDER — LOSARTAN POTASSIUM 100 MG PO TABS: 100.0000 mg | ORAL_TABLET | Freq: Every day | ORAL | 3 refills | Status: DC

## 2023-07-01 MED ORDER — POTASSIUM CHLORIDE ER 10 MEQ PO TBCR: 20.0000 meq | EXTENDED_RELEASE_TABLET | Freq: Every day | ORAL | 3 refills | Status: DC

## 2023-07-01 MED ORDER — METOPROLOL SUCCINATE ER 50 MG PO TB24: 50.0000 mg | ORAL_TABLET | Freq: Every day | ORAL | 3 refills | Status: DC

## 2023-07-01 NOTE — Patient Instructions (Signed)
Medication Instructions:  Your physician recommends that you continue on your current medications as directed. Please refer to the Current Medication list given to you today.   Labwork: None today  Testing/Procedures: None today  Follow-Up: 1 year Dr.ross  Any Other Special Instructions Will Be Listed Below (If Applicable).  If you need a refill on your cardiac medications before your next appointment, please call your pharmacy.

## 2023-07-02 NOTE — Telephone Encounter (Signed)
Summary: Take 2 tablets (20 mEq total) by mouth daily., Starting Mon 07/01/2023, Normal Dose, Route, Frequency: 20 mEq, Oral, DailyDx Associated: Start Date & Time: 11/11/2024Ord/Sold: 07/01/2023 (O)Ordered On: 11/11/2024ReportPharmacy: [facility] - [place], [place] - [address]      Ordering Department: [facility]

## 2023-07-10 ENCOUNTER — Institutional Professional Consult (permissible substitution): Payer: MEDICAID | Admitting: Internal Medicine

## 2023-07-22 ENCOUNTER — Ambulatory Visit: Payer: MEDICAID | Admitting: Obstetrics & Gynecology

## 2023-07-23 ENCOUNTER — Ambulatory Visit: Payer: MEDICAID | Admitting: Nurse Practitioner

## 2023-07-23 ENCOUNTER — Telehealth: Payer: Self-pay

## 2023-07-23 NOTE — Telephone Encounter (Signed)
Lmtcb to resch appt for today due to provider being out sick

## 2023-07-24 ENCOUNTER — Ambulatory Visit: Payer: MEDICAID | Admitting: Family Medicine

## 2023-08-07 NOTE — Progress Notes (Unsigned)
Marnell Gu, female    DOB: August 28, 1970    MRN: [id number]   Brief patient profile:  55  yowf  never smoker grew up around smokers with lots of resp infections / stuffy head and "bronchitis" dx as asthma  referred to pulmonary clinic in [place]  08/08/2023   for cough x sept 2023  and 02 dep after pna  02/2023      History of Present Illness  08/08/2023  Pulmonary/ 1st office eval/ Oniya Mandarino / [facility] symbicort 160 / incruse > very dry mouth and nose (not on humidified 02)  Chief Complaint  Patient presents with   Establish Care   Shortness of Breath    On O2/[employer], 4-6L w/ambulation, 2L at rest  Dyspnea:  only goes out to go to doctor's office [facility]  2lp at rest and needed up to 6opm  walking  Cough: varies sometimes wakes her up non productive and quite violent assoc with urinary incont and midline cp but no gagging Sleep: 45 degrees in recliner  SABA use: hfa couple times a week / no neb  02: sept 2023 p hosp > restarted back on it sev months prior to OV  p admit with dx cap/ chf.    No obvious day to day or daytime pattern/variability or assoc excess/ purulent sputum or mucus plugs or hemoptysis or cp or chest tightness, subjective wheeze or overt sinus or hb symptoms.    Also denies any obvious fluctuation of symptoms with weather or environmental changes or other aggravating or alleviating factors except as outlined above   No unusual exposure hx or h/o childhood pna/ asthma or knowledge of premature birth.  Current Allergies, Complete Past Medical History, Past Surgical History, Family History, and Social History were reviewed in [employer] record.  ROS  The following are not active complaints unless bolded Hoarseness, sore throat, dysphagia  dental problems, itching, sneezing,  nasal congestion or discharge of excess mucus or purulent secretions, ear ache,   fever, chills, sweats, unintended wt loss or wt gain,  classically pleuritic or exertional cp,  orthopnea pnd or arm/hand swelling  or leg swelling, presyncope, palpitations, abdominal pain, anorexia, nausea, vomiting, diarrhea  or change in bowel habits or change in bladder habits, change in stools or change in urine, dysuria, hematuria,  rash, arthralgias, visual complaints, headache, numbness, weakness or ataxia or problems with walking or coordination,  change in mood or  memory.            Outpatient Medications Prior to Visit  Medication Sig Dispense Refill   potassium chloride (KLOR-CON M) 10 MEQ tablet Take 10 mEq by mouth 2 (two) times daily.     acetaminophen (TYLENOL) 500 MG tablet Take 500 mg by mouth every 6 (six) hours as needed for mild pain or moderate pain.     albuterol (VENTOLIN HFA) 108 (90 Base) MCG/ACT inhaler inhale two puffs into the lungs every 6 (six) hours as needed for wheezing. 18 g 0   budesonide-formoterol (SYMBICORT) 160-4.5 MCG/ACT inhaler Inhale 2 puffs into the lungs daily. 10.2 g 0   buPROPion (WELLBUTRIN XL) 150 MG 24 hr tablet Take 150 mg by mouth daily.     cetirizine (ZYRTEC) 10 MG tablet Take 1 tablet (10 mg total) by mouth daily. 30 tablet 11   clopidogrel (PLAVIX) 75 MG tablet Take 1 tablet (75 mg total) by mouth daily. 90 tablet 3   cycloSPORINE (RESTASIS) 0.05 % ophthalmic emulsion Place  1 drop into both eyes 2 (two) times daily.     diphenhydrAMINE (BENADRYL) 25 mg capsule Take 25 mg by mouth every 6 (six) hours as needed for allergies.     FLUoxetine (PROZAC) 40 MG capsule Take 40 mg by mouth daily.     insulin aspart (NOVOLOG FLEXPEN) 100 UNIT/ML FlexPen Inject 14-20 Units into the skin 3 (three) times daily with meals. 75 mL 3   insulin glargine (LANTUS SOLOSTAR) 100 UNIT/ML Solostar Pen Inject 35 Units into the skin at bedtime. 33 mL 3   levonorgestrel (LILETTA, 52 MG,) 20.1 MCG/DAY IUD 1 each by Intrauterine route once. Inserted 04/19/21     loperamide (IMODIUM) 2 MG capsule Take 2 mg by mouth as  needed for diarrhea or loose stools.     LORazepam (ATIVAN) 0.5 MG tablet Take 0.25-0.5 mg by mouth daily as needed for anxiety.     losartan (COZAAR) 100 MG tablet Take 1 tablet (100 mg total) by mouth daily. 90 tablet 3   metoprolol succinate (TOPROL-XL) 50 MG 24 hr tablet Take 1 tablet (50 mg total) by mouth daily. Take with or immediately following a meal. 90 tablet 3   montelukast (SINGULAIR) 10 MG tablet Take 1 tablet (10 mg total) by mouth at bedtime. 30 tablet 5   nystatin (MYCOSTATIN/NYSTOP) powder Apply 1 application  topically daily as needed (for fungal infections).     oxybutynin (DITROPAN-XL) 10 MG 24 hr tablet Take 1 tablet (10 mg total) by mouth at bedtime. 30 tablet 2   OXYGEN Inhale 4 L/min into the lungs continuous.     torsemide (DEMADEX) 20 MG tablet Take 1-2 tablets (20-40 mg total) by mouth daily. Can take extra tablet with weight gain greater than 3 lb in 24 hours 180 tablet 3   umeclidinium bromide (INCRUSE ELLIPTA) 62.5 MCG/ACT AEPB Inhale 1 puff into the lungs daily. 90 each 0   valACYclovir (VALTREX) 500 MG tablet Take 1 tablet (500 mg total) by mouth daily. 30 tablet 0   fluconazole (DIFLUCAN) 150 MG tablet Take one tablet once then repeat in 2 days 1 tablet 3   potassium chloride (KLOR-CON) 10 MEQ tablet Take 2 tablets (20 mEq total) by mouth daily. 180 tablet 3   No facility-administered medications prior to visit.    Past Medical History:  Diagnosis Date   Acute on chronic congestive heart failure (HCC) 03/16/2023   Allergy    Anginal pain (HCC)    Anxiety    Arthritis    Asthma    Bladder disorder, other    Borderline personality disorder (HCC)    CAD (coronary artery disease) 03/16/2023   Carotid artery disease (HCC) 03/05/2017   Depression    Diabetes mellitus without complication (HCC)    GERD (gastroesophageal reflux disease)    Headache    Hyperlipidemia    Hypertension    Kidney stones    Morbid obesity with BMI of 50.0-59.9, adult (HCC)  01/12/2020   Mucous retention cyst of maxillary sinus 08/31/2021   Noted on head CT   Neuromuscular disorder (HCC)    Neuropathy   PTSD (post-traumatic stress disorder)    Shortness of breath    Sleep apnea    Stroke (HCC) 2018   Thyroid disease    nodule      Objective:     BP (!) 172/74   Pulse 84   Ht 5' 4.5" (1.638 m)   Wt 279 lb (126.6 kg)   SpO2 93% Comment: 2L CONT  BMI 47.15 kg/m   SpO2: 93 % (2L CONT)  MO (by bmi) amb wf nad at rest/ cough is very dry sounding   HEENT : Oropharynx  clear      Nasal turbinates very dry    NECK :  without  apparent JVD/ palpable Nodes/TM    LUNGS: no acc muscle use,  Nl contour chest which is clear to A and P bilaterally without cough on insp or exp maneuvers   CV:  RRR  no s3 or murmur or increase in P2, and no edema   ABD:  massively  obese with pannus but soft and nontender   MS:  Gait nl   ext warm without deformities Or obvious joint restrictions  calf tenderness, cyanosis or clubbing    SKIN: warm and dry without lesions    NEURO:  alert, approp, nl sensorium with  no motor or cerebellar deficits apparent.    I personally reviewed images and agree with radiology impression as follows:  CXR:   portable  [id number]  Markedly decreased but persistent bilateral interstitial opacities,  which may represent pulmonary edema or atypical infection.     Assessment   Cough variant asthma withcomponent UACS Onset sept 2023 with neg response to rx for asthma - 08/08/2023  After extensive coaching inhaler device,  effectiveness =    75% from a baseline of 25 % with clear lung exam so try off incruse and decrease symbicort to 80 2bid and max rx for gerd - Allergy screen 08/08/2023 >  Eos 0. /  IgE    DDX of  difficult airways management almost all start with A and  include Adherence, Ace Inhibitors, Acid Reflux, Active Sinus Disease, Alpha 1 Antitripsin deficiency, Anxiety masquerading as Airways dz,  ABPA,  Allergy(esp in  young), Aspiration (esp in elderly), Adverse effects of meds,  Active smoking or vaping, A bunch of PE's (a small clot burden can't cause this syndrome unless there is already severe underlying pulm or vascular dz with poor reserve) plus two Bs  = Bronchiectasis and Beta blocker use..and one C= CHF  Adherence is always the initial "prime suspect" and is a multilayered concern that requires a "trust but verify" approach in every patient - starting with knowing how to use medications, especially inhalers, correctly, keeping up with refills and understanding the fundamental difference between maintenance and prns vs those medications only taken for a very short course and then stopped and not refilled.  - see hfa teaching - return with all meds in hand using a trust but verify approach to confirm accurate Medication  Reconciliation The principal here is that until we are certain that the  patients are doing what we've asked, it makes no sense to ask them to do more.   ? Allergy/asthma > very mild clinically so try less is more approach with singulair/ symbicort 80 1-2 puffs bid  so as not to exacerbate UACS component - using the lowest effective dose  ? Acid (or non-acid) GERD > always difficult to exclude as up to 75% of pts in some series report no assoc GI/ Heartburn symptoms> rec max (24h)  acid suppression and diet restrictions/ reviewed and instructions given in writing.   ? Adverse drug effects > try off DPI   ? Chf /cardiac asthma > that component appears much improved / check bnp baseline today and f/u with cards as planned     Chronic respiratory failure with hypoxia and hypercapnia (HCC) 02 dep since pna/chf  admit 02/2023  - [facility]    07/23/23  = 33  - 08/08/2023   Walked on RA  with 93% initially but w/in 10 ft desat so placed on 4lpm x  150 ft slow pace and lowest sats 91%   As of 08/08/2023 rec  no 02 at rest, 2lpm hs and 4lpm walking   Morbid obesity with BMI of 40.0-44.9, adult  (HCC) Body mass index is 47.15 kg/m.  -   Lab Results  Component Value Date   TSH 1.910 05/16/2023      Contributing to doe and risk of GERD/OSA/ OHS  >>>   reviewed the need and the process to achieve and maintain neg calorie balance > defer f/u primary care including intermittently monitoring thyroid status            Each maintenance medication was reviewed in detail including emphasizing most importantly the difference between maintenance and prns and under what circumstances the prns are to be triggered using an action plan format where appropriate.  Total time for H and P, chart review, counseling, reviewing hfa/ 02/ pulse ox  device(s) and generating customized AVS unique to this office visit / same day charting  > 60 min very complex new pt eval.          Sandrea Hughs, MD 08/08/2023

## 2023-08-08 ENCOUNTER — Encounter: Payer: Self-pay | Admitting: Internal Medicine

## 2023-08-08 ENCOUNTER — Telehealth: Payer: Self-pay | Admitting: Internal Medicine

## 2023-08-08 ENCOUNTER — Ambulatory Visit: Payer: MEDICAID | Admitting: Internal Medicine

## 2023-08-08 VITALS — BP 172/74 | HR 84 | Ht 64.5 in | Wt 279.0 lb

## 2023-08-08 DIAGNOSIS — R0609 Other forms of dyspnea: Secondary | ICD-10-CM | POA: Insufficient documentation

## 2023-08-08 DIAGNOSIS — R059 Cough, unspecified: Secondary | ICD-10-CM

## 2023-08-08 DIAGNOSIS — Z6841 Body Mass Index (BMI) 40.0 and over, adult: Secondary | ICD-10-CM

## 2023-08-08 DIAGNOSIS — J45991 Cough variant asthma: Secondary | ICD-10-CM | POA: Diagnosis not present

## 2023-08-08 DIAGNOSIS — J9611 Chronic respiratory failure with hypoxia: Secondary | ICD-10-CM

## 2023-08-08 MED ORDER — FAMOTIDINE 20 MG PO TABS: ORAL_TABLET | ORAL | 11 refills | Status: DC

## 2023-08-08 MED ORDER — PANTOPRAZOLE SODIUM 40 MG PO TBEC: 40.0000 mg | DELAYED_RELEASE_TABLET | Freq: Every day | ORAL | 2 refills | Status: DC

## 2023-08-08 MED ORDER — BUDESONIDE-FORMOTEROL FUMARATE 80-4.5 MCG/ACT IN AERO: INHALATION_SPRAY | RESPIRATORY_TRACT | 12 refills | Status: DC

## 2023-08-08 MED ORDER — METHYLPREDNISOLONE ACETATE 80 MG/ML IJ SUSP
120.0000 mg | Freq: Once | INTRAMUSCULAR | Status: AC
  Administered 2023-08-08: 120 mg via INTRAMUSCULAR

## 2023-08-08 NOTE — Assessment & Plan Note (Signed)
Body mass index is 47.15 kg/m.  -   Lab Results  Component Value Date   TSH 1.910 05/16/2023      Contributing to doe and risk of GERD/OSA/ OHS  >>>   reviewed the need and the process to achieve and maintain neg calorie balance > defer f/u primary care including intermittently monitoring thyroid status            Each maintenance medication was reviewed in detail including emphasizing most importantly the difference between maintenance and prns and under what circumstances the prns are to be triggered using an action plan format where appropriate.  Total time for H and P, chart review, counseling, reviewing hfa/ 02/ pulse ox  device(s) and generating customized AVS unique to this office visit / same day charting  > 60 min very complex new pt eval.

## 2023-08-08 NOTE — Telephone Encounter (Signed)
Noted and added to med list. 

## 2023-08-08 NOTE — Assessment & Plan Note (Addendum)
02 dep since pna/chf admit 02/2023  - [facility]    07/23/23  = 33  - 08/08/2023   Walked on RA  with 93% initially but w/in 10 ft desat so placed on 4lpm x  150 ft slow pace and lowest sats 91%   As of 08/08/2023 rec  no 02 at rest, 2lpm hs and 4lpm walking

## 2023-08-08 NOTE — Assessment & Plan Note (Signed)
Onset sept 2023 with neg response to rx for asthma - 08/08/2023  After extensive coaching inhaler device,  effectiveness =    75% from a baseline of 25 % with clear lung exam so try off incruse and decrease symbicort to 80 2bid and max rx for gerd - Allergy screen 08/08/2023 >  Eos 0. /  IgE    DDX of  difficult airways management almost all start with A and  include Adherence, Ace Inhibitors, Acid Reflux, Active Sinus Disease, Alpha 1 Antitripsin deficiency, Anxiety masquerading as Airways dz,  ABPA,  Allergy(esp in young), Aspiration (esp in elderly), Adverse effects of meds,  Active smoking or vaping, A bunch of PE's (a small clot burden can't cause this syndrome unless there is already severe underlying pulm or vascular dz with poor reserve) plus two Bs  = Bronchiectasis and Beta blocker use..and one C= CHF  Adherence is always the initial "prime suspect" and is a multilayered concern that requires a "trust but verify" approach in every patient - starting with knowing how to use medications, especially inhalers, correctly, keeping up with refills and understanding the fundamental difference between maintenance and prns vs those medications only taken for a very short course and then stopped and not refilled.  - see hfa teaching - return with all meds in hand using a trust but verify approach to confirm accurate Medication  Reconciliation The principal here is that until we are certain that the  patients are doing what we've asked, it makes no sense to ask them to do more.   ? Allergy/asthma > very mild clinically so try less is more approach with singulair/ symbicort 80 1-2 puffs bid  so as not to exacerbate UACS component - using the lowest effective dose  ? Acid (or non-acid) GERD > always difficult to exclude as up to 75% of pts in some series report no assoc GI/ Heartburn symptoms> rec max (24h)  acid suppression and diet restrictions/ reviewed and instructions given in writing.   ? Adverse  drug effects > try off DPI   ? Chf /cardiac asthma > that component appears much improved / check bnp baseline today and f/u with cards as planned

## 2023-08-08 NOTE — Telephone Encounter (Signed)
Patient states using Claritin. Patient's DME company is [employer]. Patient phone number is [phone].

## 2023-08-08 NOTE — Patient Instructions (Addendum)
Add water to your oxygen equipment   Pantoprazole (protonix) 40 mg   Take  30-60 min before first meal of the day and Pepcid (famotidine)  20 mg after supper until return to office - this is the best way to tell whether stomach acid is contributing to your problem.    Use sugarless jolly ranchers to suppress urge to cough   Plan A = Automatic = Always=    Symbicort 80 1-2 puffs every 12 hours (take one if coughing and 2 if breathing worse but goal is to use the lower dose if possible)    Plan B = Backup (to supplement plan A, not to replace it) Only use your albuterol inhaler as a rescue medication to be used if you can't catch your breath by resting or doing a relaxed purse lip breathing pattern.  - The less you use it, the better it will work when you need it. - Ok to use the inhaler up to 2 puffs  every 4 hours if you must but call for appointment if use goes up over your usual need - Don't leave home without it !!  (think of it like the spare tire for your car)   Depomedrol 120 mg IM today   We will walk you today to establish new 0xygen needs   Please remember to go to the lab department   for your tests - we will call you with the results when they are available.      Please schedule a follow up office visit in 6 weeks, call sooner if needed with all medications /inhalers/ solutions in hand so we can verify exactly what you are taking. This includes all medications from all doctors and over the counters

## 2023-08-08 NOTE — Addendum Note (Signed)
Addended by: Shelby Dubin on: 08/08/2023 02:20 PM   Modules accepted: Orders

## 2023-08-09 LAB — BRAIN NATRIURETIC PEPTIDE: BNP: 58.5 pg/mL (ref 0.0–100.0)

## 2023-08-11 LAB — CBC WITH DIFFERENTIAL/PLATELET
Basophils Absolute: 0.1 10*3/uL (ref 0.0–0.2)
Basos: 1 %
EOS (ABSOLUTE): 0.1 10*3/uL (ref 0.0–0.4)
Eos: 1 %
Hematocrit: 42.5 % (ref 34.0–46.6)
Hemoglobin: 14.1 g/dL (ref 11.1–15.9)
Immature Grans (Abs): 0 10*3/uL (ref 0.0–0.1)
Immature Granulocytes: 0 %
Lymphocytes Absolute: 3.7 10*3/uL — ABNORMAL HIGH (ref 0.7–3.1)
Lymphs: 41 %
MCH: 29.4 pg (ref 26.6–33.0)
MCHC: 33.2 g/dL (ref 31.5–35.7)
MCV: 89 fL (ref 79–97)
Monocytes Absolute: 0.6 10*3/uL (ref 0.1–0.9)
Monocytes: 7 %
Neutrophils Absolute: 4.6 10*3/uL (ref 1.4–7.0)
Neutrophils: 50 %
Platelets: 435 10*3/uL (ref 150–450)
RBC: 4.79 x10E6/uL (ref 3.77–5.28)
RDW: 12.1 % (ref 11.7–15.4)
WBC: 9.1 10*3/uL (ref 3.4–10.8)

## 2023-08-11 LAB — BASIC METABOLIC PANEL
BUN/Creatinine Ratio: 8 — ABNORMAL LOW (ref 9–23)
BUN: 7 mg/dL (ref 6–24)
CO2: 23 mmol/L (ref 20–29)
Calcium: 9.6 mg/dL (ref 8.7–10.2)
Chloride: 94 mmol/L — ABNORMAL LOW (ref 96–106)
Creatinine, Ser: 0.83 mg/dL (ref 0.57–1.00)
Glucose: 342 mg/dL — ABNORMAL HIGH (ref 70–99)
Potassium: 3.9 mmol/L (ref 3.5–5.2)
Sodium: 135 mmol/L (ref 134–144)
eGFR: 86 mL/min/{1.73_m2} (ref >59)

## 2023-08-11 LAB — SEDIMENTATION RATE: Sed Rate: 41 mm/h — ABNORMAL HIGH (ref 0–40)

## 2023-08-11 LAB — IGE: IgE (Immunoglobulin E), Serum: 54 [IU]/mL (ref 6–495)

## 2023-08-11 LAB — TSH: TSH: 2.3 u[IU]/mL (ref 0.450–4.500)

## 2023-08-26 ENCOUNTER — Ambulatory Visit: Payer: MEDICAID | Admitting: Obstetrics & Gynecology

## 2023-09-03 ENCOUNTER — Other Ambulatory Visit: Payer: Self-pay | Admitting: *Deleted

## 2023-09-03 ENCOUNTER — Telehealth: Payer: Self-pay | Admitting: Obstetrics & Gynecology

## 2023-09-03 MED ORDER — VALACYCLOVIR HCL 500 MG PO TABS: 500.0000 mg | ORAL_TABLET | Freq: Every day | ORAL | 0 refills | Status: DC

## 2023-09-03 NOTE — Telephone Encounter (Signed)
Refill request sent to Dr. Ozan.  

## 2023-09-03 NOTE — Telephone Encounter (Signed)
 Patient would like a refill on her valtrex. Please advise.

## 2023-09-05 ENCOUNTER — Other Ambulatory Visit (HOSPITAL_COMMUNITY)
Admission: RE | Admit: 2023-09-05 | Discharge: 2023-09-05 | Disposition: A | Discharge status: Home / Self Care | Payer: MEDICAID | Source: Ambulatory Visit | Attending: Obstetrics & Gynecology | Admitting: Obstetrics & Gynecology

## 2023-09-05 ENCOUNTER — Encounter: Payer: Self-pay | Admitting: Obstetrics & Gynecology

## 2023-09-05 ENCOUNTER — Ambulatory Visit: Payer: MEDICAID | Admitting: Obstetrics & Gynecology

## 2023-09-05 VITALS — BP 160/65 | HR 70 | Ht 64.5 in | Wt 284.6 lb

## 2023-09-05 DIAGNOSIS — Z1231 Encounter for screening mammogram for malignant neoplasm of breast: Secondary | ICD-10-CM

## 2023-09-05 DIAGNOSIS — R3989 Other symptoms and signs involving the genitourinary system: Secondary | ICD-10-CM

## 2023-09-05 DIAGNOSIS — N760 Acute vaginitis: Secondary | ICD-10-CM | POA: Diagnosis present

## 2023-09-05 DIAGNOSIS — Z6841 Body Mass Index (BMI) 40.0 and over, adult: Secondary | ICD-10-CM | POA: Diagnosis not present

## 2023-09-05 DIAGNOSIS — N898 Other specified noninflammatory disorders of vagina: Secondary | ICD-10-CM | POA: Insufficient documentation

## 2023-09-05 DIAGNOSIS — Z01411 Encounter for gynecological examination (general) (routine) with abnormal findings: Secondary | ICD-10-CM | POA: Diagnosis not present

## 2023-09-05 MED ORDER — CLOTRIMAZOLE-BETAMETHASONE 1-0.05 % EX CREA: TOPICAL_CREAM | CUTANEOUS | 1 refills | Status: AC

## 2023-09-05 NOTE — Progress Notes (Signed)
WELL-WOMAN EXAMINATION Patient name: Kathryn Jimenez MRN [id number]  Date of birth: October 31, 1970 Chief Complaint:   Gynecologic Exam  History of Present Illness:   Kathryn Jimenez is a 53 y.o. G0P0000  female being seen today for a routine well-woman exam and follow up regarding the following:  S/p ablation with Liletta 03/2021. On/off bleeding since last year- most noticeable when she wipes and washes.  Bleeding is pink to bright red.  Pt does not think it is vaginal, but rather coming from external irritation.  She notes recurrent yeast- in the past has used Diflucan as needed, but she no longer has a prescription despite a refill in November.   Currently does not have itching, but notes considerable vulvar irritation.  Pt has significant incontinence and due to body habitus struggles to keep dry.  She feels like the skin is easily irritated and at times also notes dryness.   States she was seen in ED for these concerns- ruled out for UTI  Denies pelvic or abdominal pain. Pt with h/o OAB on oxybutynin.  Pt notes concern for prolapse both vaginal and rectal- afraid "her insides are going to fall out"   No LMP recorded. Patient has had an ablation.  The current method of family planning is IUD.    Last pap 2023.  Last mammogram: 2018 Last colonoscopy: 2015     05/16/2023   10:24 AM 03/16/2023   10:47 PM 12/14/2022    3:34 PM 10/23/2022   10:54 AM 07/20/2022   11:28 AM  Depression screen PHQ 2/9  Decreased Interest 1 0 1 1 1   Down, Depressed, Hopeless 1 0 1 1 1   PHQ - 2 Score 2 0 2 2 2   Altered sleeping 1  1 1 1   Tired, decreased energy 1  1 1 1   Change in appetite 1  1 1 1   Feeling bad or failure about yourself  1  1 1 1   Trouble concentrating 1  1 1 1   Moving slowly or fidgety/restless 1  1 1 1   Suicidal thoughts 0  0 0 0  PHQ-9 Score 8  8 8 8   Difficult doing work/chores Somewhat difficult  Somewhat difficult Somewhat difficult       Review of Systems:   Pertinent items are  noted in HPI Denies any headaches, blurred vision, fatigue, shortness of breath, chest pain, abdominal pain, bowel movements, urination, or intercourse unless otherwise stated above.  Pertinent History Reviewed:  Reviewed past medical,surgical, social and family history.  Reviewed problem list, medications and allergies. Physical Assessment:   Vitals:   09/05/23 1009  BP: (!) 160/65  Pulse: 70  Weight: 284 lb 9.6 oz (129.1 kg)  Height: 5' 4.5" (1.638 m)  Body mass index is 48.1 kg/m.        Physical Examination:   General appearance - well appearing, and in no distress, pt on supplemental O2  Mental status - alert, oriented, no acute distress  Psych:  She has a normal mood and affect  Skin - warm and dry, normal color, no suspicious lesions noted  Chest - effort normal, all lung fields clear to auscultation bilaterally  Heart - normal rate and regular rhythm  Neck:  midline trachea, no thyromegaly or nodules  Breasts - breasts appear normal, no suspicious masses, no skin or nipple changes or  axillary nodes  Abdomen - obese, soft and non-tender.  Large mons pannus noted- no change in size despite weigh tloss  Pelvic - VULVA: appears  erythematous and raw, with some tenderness to palpation.  No discrete lesion noted.  ?Scaling vs white discharge at perineum.  VAGINA: normal appearing vagina with normal color and discharge, no lesions  CERVIX: normal appearing cervix without discharge or lesions, no CMT, strings visualized.  No prolapse appreciated  UTERUS: uterus is felt to be normal size, shape, consistency and nontender  ADNEXA: No adnexal masses or tenderness noted- bimanual exam limited due to body habitus.  Rectal - no hemorrhoid or prolapse appreciated  Extremities:  No swelling or varicosities noted  Chaperone: Faith Rogue     Assessment & Plan:  1) Well-Woman Exam -pap up to date, reviewed guidelines -pt unable to completed mammogram due to transportation issues, pt  aware she needs to schedule  2) s/p ablation with Mirena IUD Based on history bleeding does not seem to be endometrial Will continue to monitor Device in proper location  3) Vulvar irritation/recurrent vaginitis -vaginitis panel obtained and plan to r/o UTI -further management pending results -Rx for Lotrisone sent in -unfortunately due to body habitus this issue will likely persist  4) morbid obesity/pannus of the mons -briefly discussed consideration for panniculectomy- pt was under the impression that without gastric bypass and significant weight loss this was not an option.  Will review with gen surgery -additionally due to co-morbidities, discussed with pt that she is not an ideal surgical candidate   Orders Placed This Encounter  Procedures   Urine Culture   MM 3D SCREENING MAMMOGRAM BILATERAL BREAST   Urinalysis, Routine w reflex microscopic    Meds:  Meds ordered this encounter  Medications   clotrimazole-betamethasone (LOTRISONE) cream    Sig: Apply thin layer to affected external area up to twice daily as needed    Dispense:  45 g    Refill:  1      Follow-up: Return in about 1 year (around 09/04/2024) for Annual.   Myna Hidalgo, DO Attending Obstetrician & Gynecologist, [facility] for [facility], [facility]

## 2023-09-06 ENCOUNTER — Other Ambulatory Visit: Payer: Self-pay | Admitting: Obstetrics & Gynecology

## 2023-09-06 ENCOUNTER — Encounter: Payer: Self-pay | Admitting: Obstetrics & Gynecology

## 2023-09-06 DIAGNOSIS — N760 Acute vaginitis: Secondary | ICD-10-CM

## 2023-09-06 LAB — MICROSCOPIC EXAMINATION
Bacteria, UA: NONE SEEN
Casts: NONE SEEN /[LPF]

## 2023-09-06 LAB — CERVICOVAGINAL ANCILLARY ONLY
Bacterial Vaginitis (gardnerella): NEGATIVE
Candida Glabrata: POSITIVE — AB
Candida Vaginitis: POSITIVE — AB
Comment: NEGATIVE
Comment: NEGATIVE
Comment: NEGATIVE

## 2023-09-06 LAB — URINALYSIS, ROUTINE W REFLEX MICROSCOPIC
Bilirubin, UA: NEGATIVE
Ketones, UA: NEGATIVE
Leukocytes,UA: NEGATIVE
Nitrite, UA: NEGATIVE
Protein,UA: NEGATIVE
Specific Gravity, UA: >=1.03 — AB (ref 1.005–1.030)
Urobilinogen, Ur: 0.2 mg/dL (ref 0.2–1.0)
pH, UA: 5.5 (ref 5.0–7.5)

## 2023-09-06 MED ORDER — FLUCONAZOLE 150 MG PO TABS: ORAL_TABLET | ORAL | 4 refills | Status: DC

## 2023-09-06 MED ORDER — BORIC ACID VAGINAL 600 MG VA SUPP: 600.0000 mg | Freq: Every evening | VAGINAL | 0 refills | Status: DC

## 2023-09-06 NOTE — Progress Notes (Signed)
Boric acid sent in for one week  Myna Hidalgo, DO Attending Obstetrician & Gynecologist, [facility] for [facility], [facility]

## 2023-09-09 LAB — URINE CULTURE

## 2023-09-11 ENCOUNTER — Telehealth: Payer: Self-pay | Admitting: Internal Medicine

## 2023-09-11 NOTE — Telephone Encounter (Signed)
Patient is requesting Dr. Sherene Sires send an order to [employer] [facility] att: Ms. Felipa Furnace for portable oxygen tanks for her---patient call back is [phone]

## 2023-09-11 NOTE — Telephone Encounter (Signed)
Called and spoke w/ rep at the main office. The [facility] is closed today b/c of the weather. Pt has has been set up with  portable oxygen already , pt was give 1 e tanks . I told that is the standard since patient has a portable oxygen. A message was put up for a the [place] office to call us back regarding this on when they open on the next business day .

## 2023-09-13 ENCOUNTER — Telehealth: Payer: Self-pay

## 2023-09-13 NOTE — Telephone Encounter (Signed)
Patient called back regarding her portable oxygen.  She states she does have portable tanks, but they are very small and since she has to use public transportation, they do not last very long and since her husband has been in the hospital, she has been using his oxygen.  Are there any other options for her----[phone]

## 2023-09-13 NOTE — Telephone Encounter (Signed)
Patient would like for someone to call her concerning a rx.

## 2023-09-19 NOTE — Progress Notes (Unsigned)
Kathryn Jimenez, female    DOB: 1971/07/02    MRN: [id number]   Brief patient profile:  54  yowf  never smoker grew up around smokers with lots of resp infections / stuffy head and "bronchitis" dx as asthma age 68s   referred to pulmonary clinic in [place]  08/08/2023   for cough x sept 2023  and 02 dep after pna  02/2023      History of Present Illness  08/08/2023  Pulmonary/ 1st office eval/ Kathryn Jimenez / [place] [facility] symbicort 160 / incruse/singulair > very dry mouth and nose (not on humidified 02)  Chief Complaint  Patient presents with   Establish Care   Shortness of Breath    On O2/[employer], 4-6L w/ambulation, 2L at rest  Dyspnea:  only goes out to go to doctor's office [facility]  2lpm at rest and needed up to 6opm  walking  Cough: varies sometimes wakes her up non productive and quite violent assoc with urinary incont and midline cp but no gagging Sleep: 45 degrees in recliner  SABA use: hfa couple times a week / no neb  02: sept 2023 p hosp > restarted back on it sev months prior to OV  p admit with dx cap/ chf. Rec Add water to your oxygen equipment  Pantoprazole (protonix) 40 mg   Take  30-60 min before first meal of the day and Pepcid (famotidine)  20 mg after supper until return to office - this is the best way to tell whether stomach acid is contributing to your problem.   Use sugarless jolly ranchers to suppress urge to cough  Plan A = Automatic = Always=    Symbicort 80 1-2 puffs every 12 hours (take one if coughing and 2 if breathing worse but goal is to use the lower dose if possible)  Plan B = Backup (to supplement plan A, not to replace it) Only use your albuterol inhaler as a rescue medication Depomedrol 120 mg IM today  We will walk you today to establish new 0xygen needs  Please schedule a follow up office visit in 6 weeks, call sooner if needed with all medications /inhalers/ solutions in hand    Allergy screen 08/08/23  >  Eos 0.1 /  IgE   54    09/20/2023  f/u ov/[place] office/Kathryn Jimenez re: asthma/ 02 dep  maint on symbicort 80  did  bring meds   Chief Complaint  Patient presents with   Follow-up    Follow up   Dyspnea:  dry mouth is better / doing more walking since last ov  Cough: with insp or walking  Sleeping: recliner at lower angle s  resp cc > could not tol cpap in past and says last studies were neg for osa anyway  SABA use: twice a week avg 02: 2lpm  at bedtime / at rest  and prn walking      No obvious day to day or daytime variability or assoc excess/ purulent sputum or mucus plugs or hemoptysis or cp or chest tightness, subjective wheeze or overt sinus or hb symptoms.    Also denies any obvious fluctuation of symptoms with weather or environmental changes or other aggravating or alleviating factors except as outlined above   No unusual exposure hx or h/o childhood pna/ asthma or knowledge of premature birth.  Current Allergies, Complete Past Medical History, Past Surgical History, Family History, and Social History were reviewed in [employer] record.  ROS  The following are not active complaints unless bolded Hoarseness, sore throat, dysphagia, dental problems, itching, sneezing,  nasal congestion or discharge of excess mucus or purulent secretions, ear ache,   fever, chills, sweats, unintended wt loss or wt gain, classically pleuritic or exertional cp,  orthopnea pnd or arm/hand swelling  or leg swelling, presyncope, palpitations, abdominal pain, anorexia, nausea, vomiting, diarrhea  or change in bowel habits or change in bladder habits, change in stools or change in urine, dysuria, hematuria,  rash, arthralgias, visual complaints, headache, numbness, weakness or ataxia or problems with walking or coordination,  change in mood or  memory.        Current Meds  Medication Sig   acetaminophen (TYLENOL) 500 MG tablet Take 500 mg by mouth every 6 (six) hours as needed for mild pain or  moderate pain.   albuterol (VENTOLIN HFA) 108 (90 Base) MCG/ACT inhaler inhale two puffs into the lungs every 6 (six) hours as needed for wheezing.   Boric Acid Vaginal 600 MG SUPP Place 600 mg vaginally at bedtime.   budesonide-formoterol (SYMBICORT) 80-4.5 MCG/ACT inhaler Take 2 puffs first thing in am and then another 2 puffs about 12 hours later.   buPROPion (WELLBUTRIN XL) 150 MG 24 hr tablet Take 150 mg by mouth daily.   clopidogrel (PLAVIX) 75 MG tablet Take 1 tablet (75 mg total) by mouth daily.   clotrimazole-betamethasone (LOTRISONE) cream Apply thin layer to affected external area up to twice daily as needed   cycloSPORINE (RESTASIS) 0.05 % ophthalmic emulsion Place 1 drop into both eyes 2 (two) times daily.   diphenhydrAMINE (BENADRYL) 25 mg capsule Take 25 mg by mouth every 6 (six) hours as needed for allergies.   famotidine (PEPCID) 20 MG tablet One after supper   fluconazole (DIFLUCAN) 150 MG tablet Take one pill every 3 days   FLUoxetine (PROZAC) 40 MG capsule Take 40 mg by mouth daily.   insulin aspart (NOVOLOG FLEXPEN) 100 UNIT/ML FlexPen Inject 14-20 Units into the skin 3 (three) times daily with meals.   insulin glargine (LANTUS SOLOSTAR) 100 UNIT/ML Solostar Pen Inject 35 Units into the skin at bedtime.   L-GLUTAMINE PO Take by mouth.   levonorgestrel (LILETTA, 52 MG,) 20.1 MCG/DAY IUD 1 each by Intrauterine route once. Inserted 04/19/21   loperamide (IMODIUM) 2 MG capsule Take 2 mg by mouth as needed for diarrhea or loose stools.   loratadine (CLARITIN) 10 MG tablet Take 1 tablet (10 mg total) by mouth daily.   LORazepam (ATIVAN) 0.5 MG tablet Take 0.25-0.5 mg by mouth daily as needed for anxiety.   losartan (COZAAR) 100 MG tablet Take 1 tablet (100 mg total) by mouth daily.   metoprolol succinate (TOPROL-XL) 50 MG 24 hr tablet Take 1 tablet (50 mg total) by mouth daily. Take with or immediately following a meal.   montelukast (SINGULAIR) 10 MG tablet Take 1 tablet (10 mg  total) by mouth at bedtime.   NON FORMULARY Vit d3 with K2 taking once a day   nystatin (MYCOSTATIN/NYSTOP) powder Apply 1 application  topically daily as needed (for fungal infections).   oxybutynin (DITROPAN-XL) 10 MG 24 hr tablet Take 1 tablet (10 mg total) by mouth at bedtime.   OXYGEN Inhale 4 L/min into the lungs continuous.   pantoprazole (PROTONIX) 40 MG tablet Take 1 tablet (40 mg total) by mouth daily. Take 30-60 min before first meal of the day   potassium chloride (KLOR-CON M) 10 MEQ tablet Take 10 mEq by mouth 2 (  two) times daily.   valACYclovir (VALTREX) 500 MG tablet Take 1 tablet (500 mg total) by mouth daily.            Past Medical History:  Diagnosis Date   Acute on chronic congestive heart failure (HCC) 03/16/2023   Allergy    Anginal pain (HCC)    Anxiety    Arthritis    Asthma    Bladder disorder, other    Borderline personality disorder (HCC)    CAD (coronary artery disease) 03/16/2023   Carotid artery disease (HCC) 03/05/2017   Depression    Diabetes mellitus without complication (HCC)    GERD (gastroesophageal reflux disease)    Headache    Hyperlipidemia    Hypertension    Kidney stones    Morbid obesity with BMI of 50.0-59.9, adult (HCC) 01/12/2020   Mucous retention cyst of maxillary sinus 08/31/2021   Noted on head CT   Neuromuscular disorder (HCC)    Neuropathy   PTSD (post-traumatic stress disorder)    Shortness of breath    Sleep apnea    Stroke (HCC) 2018   Thyroid disease    nodule      Objective:     Wt Readings from Last 3 Encounters:  09/20/23 283 lb 6.4 oz (128.5 kg)  09/05/23 284 lb 9.6 oz (129.1 kg)  08/08/23 279 lb (126.6 kg)      Vital signs reviewed  09/20/2023  - Note at rest 02 sats  87% on RA   General appearance:    MO (by bmi) amb wf nad    HEENT : Oropharynx  clear          NECK :  without  apparent JVD/ palpable Nodes/TM    LUNGS: no acc muscle use,  Nl contour chest which is clear to A and P  bilaterally with cough on insp  maneuvers   CV:  RRR  no s3 or murmur or increase in P2, and no edema   ABD:  soft and nontender   MS:  Gait nl   ext warm without deformities Or obvious joint restrictions  calf tenderness, cyanosis or clubbing    SKIN: warm and dry without lesions    NEURO:  alert, approp, nl sensorium with  no motor or cerebellar deficits apparent.          Assessment

## 2023-09-20 ENCOUNTER — Encounter: Payer: Self-pay | Admitting: Internal Medicine

## 2023-09-20 ENCOUNTER — Ambulatory Visit (INDEPENDENT_AMBULATORY_CARE_PROVIDER_SITE_OTHER): Payer: MEDICAID | Admitting: Internal Medicine

## 2023-09-20 VITALS — BP 148/76 | HR 92 | Ht 64.5 in | Wt 283.4 lb

## 2023-09-20 DIAGNOSIS — R0609 Other forms of dyspnea: Secondary | ICD-10-CM

## 2023-09-20 DIAGNOSIS — J9611 Chronic respiratory failure with hypoxia: Secondary | ICD-10-CM | POA: Diagnosis not present

## 2023-09-20 DIAGNOSIS — J45991 Cough variant asthma: Secondary | ICD-10-CM | POA: Diagnosis not present

## 2023-09-20 DIAGNOSIS — Z6841 Body Mass Index (BMI) 40.0 and over, adult: Secondary | ICD-10-CM

## 2023-09-20 DIAGNOSIS — J9612 Chronic respiratory failure with hypercapnia: Secondary | ICD-10-CM

## 2023-09-20 NOTE — Patient Instructions (Addendum)
Make sure you check your oxygen saturation  AT  your highest level of activity (not after you stop)   to be sure it stays over 90% and adjust  02 flow upward to maintain this level if needed but remember to turn it back to previous settings when you stop (to conserve your supply).   Try off symbicort to see if your breathing when you walk is worse or your   need for rescue inhaler goes up in which case you should go back to Take 2 puffs first thing in am and then another 2 puffs about 12 hours later.     Please remember to go to the lab department   for your tests - we will call you with the results when they are available.      Please remember to go to the  x-ray department  @  [facility] for your tests - we will call you with the results when they are available     Please schedule a follow up office visit in 6 weeks, call sooner if needed with pfts asap

## 2023-09-20 NOTE — Assessment & Plan Note (Signed)
02 dep since pna/chf admit 02/2023  - HC03    07/23/23  = 33  - 08/08/2023   Walked on RA  with 93% initially but w/in 10 ft desat so placed on 4lpm x  150 ft slow pace and lowest sats 91%  - 09/20/2023   Walked on 4lpm   x  1  lap(s) =  approx 150  ft  @ slow pace, stopped due to sob with lowest 02 sats 90%    As of 09/20/2023 rec  no 02 at rest, 2lpm hs and 4lpm walking

## 2023-09-20 NOTE — Assessment & Plan Note (Signed)
Body mass index is 47.89 kg/m.  -  trending no change  Lab Results  Component Value Date   TSH 2.300 08/08/2023      Contributing to doe and risk of GERD/dvt/ PE >>>   reviewed the need and the process to achieve and maintain neg calorie balance > defer f/u primary care including intermittently monitoring thyroid status

## 2023-09-20 NOTE — Assessment & Plan Note (Addendum)
Onset sept 2023 with neg response to rx for asthma - 08/08/2023  After extensive coaching inhaler device,  effectiveness =    75% from a baseline of 25 % with clear lung exam so try off incruse and decrease symbicort to 80 2bid and max rx for gerd - Allergy screen 08/08/23  >  Eos 0.1 /  IgE  54  - changed symbicort 80  to prn 09/20/2023 >>>   She is not convinced the inhalers are helping and neither am I  - if she has asthma it is mild and rx with symb 80 prn Based on two studies from NEJM  378; 20 p 1865 (2018) and 380 : p2020-30 (2019) in pts with mild asthma it is reasonable to use low dose symbicort eg 80 2bid "prn" flare in this setting but I emphasized this was only shown with symbicort and takes advantage of the rapid onset of action but is not the same as "rescue therapy" but can be stopped once the acute symptoms have resolved and the need for rescue has been minimized (< 2 x weekly)

## 2023-09-20 NOTE — Telephone Encounter (Signed)
Pt came  for a office visit to discuss changes to her oxygen

## 2023-09-23 ENCOUNTER — Ambulatory Visit (HOSPITAL_COMMUNITY)
Admission: RE | Admit: 2023-09-23 | Discharge: 2023-09-23 | Disposition: A | Discharge status: Home / Self Care | Payer: MEDICAID | Source: Ambulatory Visit | Attending: Internal Medicine | Admitting: Internal Medicine

## 2023-09-23 DIAGNOSIS — R0609 Other forms of dyspnea: Secondary | ICD-10-CM | POA: Insufficient documentation

## 2023-09-25 ENCOUNTER — Telehealth: Payer: Self-pay | Admitting: Internal Medicine

## 2023-09-25 NOTE — Telephone Encounter (Signed)
Patient would like to know the results of her chest xray as well as her blood work from last week. She can be reached at [phone]

## 2023-09-25 NOTE — Telephone Encounter (Signed)
 Pt is requesting her results please advise

## 2023-09-26 NOTE — Telephone Encounter (Signed)
 Called and informed pt of this message, she stated that she understood, I told her that once we get the results someone will let her know. She also want to know about her lab result. Pt stated that it has been a week , I advised pt that he lab were back  as of yet , she will contact once this had been resulted

## 2023-09-26 NOTE — Telephone Encounter (Signed)
 Let her know all are still pending and we'll call her when they are all back - sooner if any preliminary results are abn but so far so good

## 2023-09-28 ENCOUNTER — Other Ambulatory Visit: Payer: Self-pay | Admitting: Family Medicine

## 2023-09-28 DIAGNOSIS — N39498 Other specified urinary incontinence: Secondary | ICD-10-CM

## 2023-10-06 LAB — CBC WITH DIFFERENTIAL/PLATELET
Basophils Absolute: 0.1 10*3/uL (ref 0.0–0.2)
Basos: 1 %
EOS (ABSOLUTE): 0.1 10*3/uL (ref 0.0–0.4)
Eos: 1 %
Hematocrit: 40.4 % (ref 34.0–46.6)
Hemoglobin: 13.3 g/dL (ref 11.1–15.9)
Immature Grans (Abs): 0.1 10*3/uL (ref 0.0–0.1)
Immature Granulocytes: 1 %
Lymphocytes Absolute: 2.6 10*3/uL (ref 0.7–3.1)
Lymphs: 30 %
MCH: 28.9 pg (ref 26.6–33.0)
MCHC: 32.9 g/dL (ref 31.5–35.7)
MCV: 88 fL (ref 79–97)
Monocytes Absolute: 0.6 10*3/uL (ref 0.1–0.9)
Monocytes: 7 %
Neutrophils Absolute: 5.4 10*3/uL (ref 1.4–7.0)
Neutrophils: 60 %
Platelets: 448 10*3/uL (ref 150–450)
RBC: 4.6 x10E6/uL (ref 3.77–5.28)
RDW: 13 % (ref 11.7–15.4)
WBC: 8.9 10*3/uL (ref 3.4–10.8)

## 2023-10-06 LAB — ALPHA-1-ANTITRYPSIN PHENOTYP: A-1 Antitrypsin: 203 mg/dL — ABNORMAL HIGH (ref 101–187)

## 2023-10-06 LAB — D-DIMER, QUANTITATIVE: D-DIMER: 0.36 mg{FEU}/L (ref 0.00–0.49)

## 2023-10-06 LAB — BASIC METABOLIC PANEL
BUN/Creatinine Ratio: 11 (ref 9–23)
BUN: 9 mg/dL (ref 6–24)
CO2: 24 mmol/L (ref 20–29)
Calcium: 9.1 mg/dL (ref 8.7–10.2)
Chloride: 95 mmol/L — ABNORMAL LOW (ref 96–106)
Creatinine, Ser: 0.83 mg/dL (ref 0.57–1.00)
Glucose: 275 mg/dL — ABNORMAL HIGH (ref 70–99)
Potassium: 3.9 mmol/L (ref 3.5–5.2)
Sodium: 135 mmol/L (ref 134–144)
eGFR: 86 mL/min/{1.73_m2} (ref >59)

## 2023-10-06 LAB — BRAIN NATRIURETIC PEPTIDE: BNP: 43.2 pg/mL (ref 0.0–100.0)

## 2023-10-06 LAB — TSH: TSH: 1.83 u[IU]/mL (ref 0.450–4.500)

## 2023-10-06 LAB — SEDIMENTATION RATE: Sed Rate: 22 mm/h (ref 0–40)

## 2023-10-06 LAB — IGE: IgE (Immunoglobulin E), Serum: 50 [IU]/mL (ref 6–495)

## 2023-10-08 ENCOUNTER — Telehealth: Payer: Self-pay | Admitting: Nurse Practitioner

## 2023-10-08 ENCOUNTER — Other Ambulatory Visit: Payer: Self-pay | Admitting: Obstetrics & Gynecology

## 2023-10-08 MED ORDER — VALACYCLOVIR HCL 500 MG PO TABS
500.0000 mg | ORAL_TABLET | Freq: Every day | ORAL | 6 refills | Status: DC
Start: 2023-10-08 — End: 2024-05-28

## 2023-10-08 NOTE — Telephone Encounter (Signed)
 Pt would like to get back on the schedule, do you need any labs or just her meter and logs?

## 2023-10-08 NOTE — Telephone Encounter (Signed)
 She will not need any labs prior to the visit, just meter and logs.

## 2023-10-08 NOTE — Telephone Encounter (Signed)
 Sent pt my chart message with next available appt with Whitney.

## 2023-10-10 NOTE — Patient Instructions (Signed)
 Our records indicate that you are due for your annual mammogram/breast imaging. While there is no way to prevent breast cancer, early detection provides the best opportunity for curing it. For women over the age of 39, the American Cancer Society recommends a yearly clinical breast exam and a yearly mammogram. These practices have saved thousands of lives. We need your help to ensure that you are receiving optimal medical care. Please call the imaging location that has done you previous mammograms. Please remember to list [place] as your primary care. This helps make sure we receive a report and can update your chart.  Below is the contact information for several local breast imaging centers. You may call the location that works best for you, and they will be happy to assistance in making you an appointment. You do not need an order for a regular screening mammogram. However, if you are having any problems or concerns with you breast area, please let your primary care provider know, and appropriate orders will be placed. Please let our office know if you have any questions or concerns. Or if you need information for another imaging center not on this list or outside of the area. We are commented to working with you on your health care journey.   The mobile unit/bus ([facility]) - they come twice a month to our location.  These appointments can be made through our office or by call The Breast Center  [facility]  [address] [place], [place] [address] Phone [phone]  [facility] Department  [address] [place], [place] [address] [phone]  [facility] ([facility] of [facility])  [phone] [address], [place] [address] [phone]  [facility]  [address] [place] [place] [address] [phone]  [facility] - [facility]  [address] [place] [address] [phone]  [facility] Mammography in [place]  [address] [place], [place] [address] [phone]  [facility] [address] [place], [place] [address] [phone]  [facility] [address] [place], [place] [address] [phone]  [facility] [place], [place] [address] [phone]

## 2023-10-15 ENCOUNTER — Ambulatory Visit (INDEPENDENT_AMBULATORY_CARE_PROVIDER_SITE_OTHER): Payer: MEDICAID | Admitting: Family Medicine

## 2023-10-15 ENCOUNTER — Encounter: Payer: Self-pay | Admitting: Family Medicine

## 2023-10-15 VITALS — BP 133/63 | HR 103 | Temp 98.7°F | Ht 64.5 in | Wt 275.0 lb

## 2023-10-15 DIAGNOSIS — J9611 Chronic respiratory failure with hypoxia: Secondary | ICD-10-CM

## 2023-10-15 DIAGNOSIS — E1142 Type 2 diabetes mellitus with diabetic polyneuropathy: Secondary | ICD-10-CM

## 2023-10-15 DIAGNOSIS — E1169 Type 2 diabetes mellitus with other specified complication: Secondary | ICD-10-CM

## 2023-10-15 DIAGNOSIS — I152 Hypertension secondary to endocrine disorders: Secondary | ICD-10-CM

## 2023-10-15 DIAGNOSIS — N39498 Other specified urinary incontinence: Secondary | ICD-10-CM

## 2023-10-15 DIAGNOSIS — Z794 Long term (current) use of insulin: Secondary | ICD-10-CM

## 2023-10-15 DIAGNOSIS — J9612 Chronic respiratory failure with hypercapnia: Secondary | ICD-10-CM

## 2023-10-15 DIAGNOSIS — F331 Major depressive disorder, recurrent, moderate: Secondary | ICD-10-CM

## 2023-10-15 DIAGNOSIS — Z1211 Encounter for screening for malignant neoplasm of colon: Secondary | ICD-10-CM

## 2023-10-15 DIAGNOSIS — E559 Vitamin D deficiency, unspecified: Secondary | ICD-10-CM

## 2023-10-15 DIAGNOSIS — E1159 Type 2 diabetes mellitus with other circulatory complications: Secondary | ICD-10-CM | POA: Diagnosis not present

## 2023-10-15 DIAGNOSIS — E785 Hyperlipidemia, unspecified: Secondary | ICD-10-CM

## 2023-10-15 LAB — BAYER DCA HB A1C WAIVED: HB A1C (BAYER DCA - WAIVED): 11.1 % — ABNORMAL HIGH (ref 4.8–5.6)

## 2023-10-15 LAB — LIPID PANEL

## 2023-10-15 NOTE — Progress Notes (Signed)
 Subjective:  Patient ID: Kathryn Jimenez, female    DOB: 1971/01/15, 53 y.o.   MRN: [id number]  Patient Care Team: Arrie Senate, FNP as PCP - General (Family Medicine) Pricilla Riffle, MD as PCP - Cardiology (Cardiology) Dorisann Frames, MD as Consulting Physician (Endocrinology) Malissa Hippo, MD (Inactive) as Consulting Physician (Gastroenterology) Marcelino Duster, MD as Consulting Physician (Dermatology) Case, Helen Hashimoto, MD as Consulting Physician (Orthopedic Surgery) Nyoka Cowden, MD as Consulting Physician (Pulmonary Disease)   Chief Complaint:  Medical Management of Chronic Issues  HPI: Kathryn Jimenez is a 53 y.o. female presenting on 10/15/2023 for Medical Management of Chronic Issues  HPI 1. Type 2 diabetes mellitus with diabetic polyneuropathy, with long-term current use of insulin (HCC) (Primary) Glucometer: Has Accucheck Guide   High at home: >300; Low at home: 82, Taking medication(s): novolog, lantus  States that she had issues with metformin  Is established with Lurlean Leyden, NP, endocrinology. She has follow up scheduled in May.  She has preivously tried [place], [employer].  She has not tried GLP1I. She does not wish to start them.   Last eye exam: due  Last foot exam: due - does not go to podiatry due to transportation. She is reliant on [employer] transportation  Last A1c:  Lab Results  Component Value Date   HGBA1C 8.7 (A) 10/19/2022   Nephropathy screen indicated?: completed today  Last flu, zoster and/or pneumovax:  Immunization History  Administered Date(s) Administered   Influenza,inj,Quad PF,6+ Mos 07/01/2017, 05/29/2018   Influenza-Unspecified 05/25/2014, 07/03/2016   Tdap 01/26/2010, 01/21/2020   ROS, Endorses dizziness, , polyuria, polydipsia, , foot ulcerations,  or chest pain. Endorses LOC, unintended weight loss/gain, SOB, numbness or tingling in extremities (states that this is baseline for her).   2. Hypertension associated with type 2  diabetes mellitus (HCC) Has BP monitor at home Yes - batteries died  BP at home average - does not know average, states that it was not hight  ROS Denies peripheral edema, chest pain, PND, orthopnea Endorses changes to vision. States that she is trying to get an appt.  Fatigue, headaches, palpitations, SOB  Established with Cardiology  Meds metoprolol, torsemide  Not currently taking losartan   CAD risks known cardiac disease, hypertension, hypercholesterolemia/hyperlipidemia, CHF, history of CVA.   3. Hyperlipidemia associated with type 2 diabetes mellitus (HCC) Lipid/Cholesterol, Follow-up  Last lipid panel Other pertinent labs  Lab Results  Component Value Date   CHOL 212 (H) 05/16/2023   HDL 39 (L) 05/16/2023   LDLCALC 153 (H) 05/16/2023   TRIG 108 05/16/2023   CHOLHDL 5.4 (H) 05/16/2023   Lab Results  Component Value Date   ALT 6 05/16/2023   AST 11 05/16/2023   PLT 448 09/20/2023   TSH 1.830 09/20/2023     She was last seen for this 6 months ago.  Management since that visit includes currently not taking medications, states that she has tried statins and zetia. Not able to tolerate, does not wish to restart. .  Symptoms: No chest pain No chest pressure/discomfort  Yes dyspnea No lower extremity edema  Yes numbness or tingling of extremity No orthopnea  Yes palpitations No paroxysmal nocturnal dyspnea  No speech difficulty No syncope   Current diet:  states that she is trying to eat more meat and vegetables. States that she eats "all over the place"  Current exercise: none formally, housework   The ASCVD Risk score (Arnett DK, et al., 2019) failed to calculate for  the following reasons:   Risk score cannot be calculated because patient has a medical history suggesting prior/existing ASCVD  ---------------------------------------------------------------------------------------------------  4. Vitamin D deficiency Vitamin D deficiency, follow-up  Lab Results   Component Value Date   VD25OH 22.8 (L) 05/16/2023   VD25OH 15.1 (L) 12/14/2022   VD25OH 15.6 (L) 03/21/2022   CALCIUM 9.1 09/20/2023   CALCIUM 9.6 08/08/2023        Wt Readings from Last 3 Encounters:  10/15/23 275 lb (124.7 kg)  09/20/23 283 lb 6.4 oz (128.5 kg)  09/05/23 284 lb 9.6 oz (129.1 kg)    She was last seen for vitamin D deficiency 6 months ago.  Management since that visit includes supplement 10,000 international units . She reports good compliance with treatment. She is not having side effects.   Symptoms: No change in energy level Yes numbness or tingling  No bone pain No unexplained fracture   ---------------------------------------------------------------------------------------------------  5. Chronic respiratory failure with hypoxia and hypercapnia (HCC) Using 4 Liters with ambulation, 2 Liters at night, and none while sitting. Established with Pulmonology and planning for CT. She is using a humidifier on her O2.  6. Moderate episode of recurrent major depressive disorder (HCC) States that anxiety is still elevated. She takes lorazepam as needed. She is established with Dr[facility] Geanie Cooley at [facility]. She reports 1/2 tablet 1-2 times per week. Denies any thoughts of self harm. Continues on wellbutrin, prozac.   7. Incontinence  States that it is better. Taking oxybutynin. She is established with Ob/gyn. States that she has incontinence with cough. Reports incontinence with bowel movements as well with cough or with movement. Reports that this started after having pneumonia.   Relevant past medical, surgical, family, and social history reviewed and updated as indicated.  Allergies and medications reviewed and updated. Data reviewed: Chart in Epic.  Past Medical History:  Diagnosis Date   Acute on chronic congestive heart failure (HCC) 03/16/2023   Allergy    Anginal pain (HCC)    Anxiety    Arthritis    Asthma    Bladder disorder, other    Borderline personality  disorder (HCC)    CAD (coronary artery disease) 03/16/2023   Carotid artery disease (HCC) 03/05/2017   Depression    Diabetes mellitus without complication (HCC)    GERD (gastroesophageal reflux disease)    Headache    Hyperlipidemia    Hypertension    Kidney stones    Morbid obesity with BMI of 50.0-59.9, adult (HCC) 01/12/2020   Mucous retention cyst of maxillary sinus 08/31/2021   Noted on head CT   Neuromuscular disorder (HCC)    Neuropathy   PTSD (post-traumatic stress disorder)    Shortness of breath    Sleep apnea    Stroke (HCC) 2018   Thyroid disease    nodule   Past Surgical History:  Procedure Laterality Date   CARPAL TUNNEL RELEASE     left 2015   CERVICAL FUSION Bilateral 2003   C 5-6 with titanium screws   CHOLECYSTECTOMY     COLONOSCOPY WITH PROPOFOL N/A 06/24/2014   Procedure: COLONOSCOPY WITH PROPOFOL;  Surgeon: Malissa Hippo, MD;  Location: [facility];  Service: Endoscopy;  Laterality: N/A;  cecum time in 0810    time out   0821    total time 11 minutes   DILITATION & CURRETTAGE/HYSTROSCOPY WITH HYDROTHERMAL ABLATION N/A 04/19/2021   Procedure: DILATATION & CURETTAGE/HYSTEROSCOPY WITH HYDROTHERMAL ABLATION;  Surgeon: Myna Hidalgo, DO;  Location: [facility];  Service: Gynecology;  Laterality: N/A;   INTRAUTERINE DEVICE (IUD) INSERTION N/A 04/19/2021   Procedure: INTRAUTERINE DEVICE (IUD) INSERTION;  Surgeon: Myna Hidalgo, DO;  Location: [facility];  Service: Gynecology;  Laterality: N/A;   POLYPECTOMY N/A 06/24/2014   Procedure: RECTAL POLYPECTOMY;  Surgeon: Malissa Hippo, MD;  Location: [facility];  Service: Endoscopy;  Laterality: N/A;   RIGHT/LEFT HEART CATH AND CORONARY ANGIOGRAPHY N/A 06/19/2022   Procedure: RIGHT/LEFT HEART CATH AND CORONARY ANGIOGRAPHY;  Surgeon: Tonny Bollman, MD;  Location: [facility] INVASIVE CV LAB;  Service: Cardiovascular;  Laterality: N/A;   SPINE SURGERY     URETHRAL DILATION  1981   WISDOM TOOTH EXTRACTION Bilateral 2001 and 1990s   top and  bottom    Social History   Socioeconomic History   Marital status: Married    Spouse name: Homero Fellers   Number of children: 0   Years of education: HS   Highest education level: 12th grade  Occupational History   Occupation: Disabled  Tobacco Use   Smoking status: Never   Smokeless tobacco: Never  Vaping Use   Vaping status: Never Used  Substance and Sexual Activity   Alcohol use: No    Comment: Rarely   Drug use: No   Sexual activity: Not Currently    Birth control/protection: I.U.D.  Other Topics Concern   Not on file  Social History Narrative   Lives at home with husband.   Right-handed.   3-4 cups caffeine per day.   Social Drivers of Health   Financial Resource Strain: Patient Declined (10/15/2023)   Overall Financial Resource Strain (CARDIA)    Difficulty of Paying Living Expenses: Patient declined  Food Insecurity: Patient Declined (10/15/2023)   Hunger Vital Sign    Worried About Running Out of Food in the Last Year: Patient declined    Ran Out of Food in the Last Year: Patient declined  Transportation Needs: Unmet Transportation Needs (10/15/2023)   PRAPARE - Transportation    Lack of Transportation (Medical): Yes    Lack of Transportation (Non-Medical): Patient declined  Physical Activity: Insufficiently Active (10/15/2023)   Exercise Vital Sign    Days of Exercise per Week: 1 day    Minutes of Exercise per Session: 10 min  Stress: Stress Concern Present (10/15/2023)   [employer] of Occupational Health - Occupational Stress Questionnaire    Feeling of Stress : To some extent  Social Connections: Moderately Integrated (10/15/2023)   Social Connection and Isolation Panel [NHANES]    Frequency of Communication with Friends and Family: More than three times a week    Frequency of Social Gatherings with Friends and Family: Once a week    Attends Religious Services: Never    Database administrator or Organizations: Yes    Attends Banker Meetings:  Never    Marital Status: Married  Catering manager Violence: Not At Risk (03/16/2023)   Humiliation, Afraid, Rape, and Kick questionnaire    Fear of Current or Ex-Partner: No    Emotionally Abused: No    Physically Abused: No    Sexually Abused: No    Outpatient Encounter Medications as of 10/15/2023  Medication Sig   acetaminophen (TYLENOL) 500 MG tablet Take 500 mg by mouth every 6 (six) hours as needed for mild pain or moderate pain.   albuterol (VENTOLIN HFA) 108 (90 Base) MCG/ACT inhaler inhale two puffs into the lungs every 6 (six) hours as needed for wheezing.   Boric Acid Vaginal 600 MG SUPP Place  600 mg vaginally at bedtime.   budesonide-formoterol (SYMBICORT) 80-4.5 MCG/ACT inhaler Take 2 puffs first thing in am and then another 2 puffs about 12 hours later.   buPROPion (WELLBUTRIN XL) 150 MG 24 hr tablet Take 150 mg by mouth daily.   clopidogrel (PLAVIX) 75 MG tablet Take 1 tablet (75 mg total) by mouth daily.   clotrimazole-betamethasone (LOTRISONE) cream Apply thin layer to affected external area up to twice daily as needed   cycloSPORINE (RESTASIS) 0.05 % ophthalmic emulsion Place 1 drop into both eyes 2 (two) times daily.   diphenhydrAMINE (BENADRYL) 25 mg capsule Take 25 mg by mouth every 6 (six) hours as needed for allergies.   famotidine (PEPCID) 20 MG tablet One after supper   fluconazole (DIFLUCAN) 150 MG tablet Take one pill every 3 days   FLUoxetine (PROZAC) 40 MG capsule Take 40 mg by mouth daily.   insulin aspart (NOVOLOG FLEXPEN) 100 UNIT/ML FlexPen Inject 14-20 Units into the skin 3 (three) times daily with meals.   insulin glargine (LANTUS SOLOSTAR) 100 UNIT/ML Solostar Pen Inject 35 Units into the skin at bedtime.   levonorgestrel (LILETTA, 52 MG,) 20.1 MCG/DAY IUD 1 each by Intrauterine route once. Inserted 04/19/21   loperamide (IMODIUM) 2 MG capsule Take 2 mg by mouth as needed for diarrhea or loose stools.   loratadine (CLARITIN) 10 MG tablet Take 1 tablet (10  mg total) by mouth daily.   LORazepam (ATIVAN) 0.5 MG tablet Take 0.25-0.5 mg by mouth daily as needed for anxiety.   losartan (COZAAR) 100 MG tablet Take 1 tablet (100 mg total) by mouth daily.   metoprolol succinate (TOPROL-XL) 50 MG 24 hr tablet Take 1 tablet (50 mg total) by mouth daily. Take with or immediately following a meal.   montelukast (SINGULAIR) 10 MG tablet Take 1 tablet (10 mg total) by mouth at bedtime.   NON FORMULARY Vit d3 with K2 taking once a day   nystatin (MYCOSTATIN/NYSTOP) powder Apply 1 application  topically daily as needed (for fungal infections).   oxybutynin (DITROPAN-XL) 10 MG 24 hr tablet Take 1 tablet (10 mg total) by mouth at bedtime. **NEEDS TO BE SEEN BEFORE NEXT REFILL**   OXYGEN Inhale 4 L/min into the lungs continuous.   pantoprazole (PROTONIX) 40 MG tablet Take 1 tablet (40 mg total) by mouth daily. Take 30-60 min before first meal of the day   potassium chloride (KLOR-CON M) 10 MEQ tablet Take 10 mEq by mouth 2 (two) times daily.   valACYclovir (VALTREX) 500 MG tablet Take 1 tablet (500 mg total) by mouth daily.   torsemide (DEMADEX) 20 MG tablet Take 1-2 tablets (20-40 mg total) by mouth daily. Can take extra tablet with weight gain greater than 3 lb in 24 hours   No facility-administered encounter medications on file as of 10/15/2023.    Allergies  Allergen Reactions   Actos [Pioglitazone] Swelling   Augmentin [Amoxicillin-Pot Clavulanate] Diarrhea, Nausea Only and Other (See Comments)    Caused severe diarrhea   Citrus Other (See Comments)    "Issues with my tongue"- the fleshy part of the fruit    Invokana [Canagliflozin] Other (See Comments)    Yeast infection   Lopid [Gemfibrozil] Other (See Comments)    Made the tongue burn   Metoprolol Other (See Comments)    Headaches    Naproxen Sodium Other (See Comments)    Patient states she "felt loopy"   Penicillins Diarrhea and Other (See Comments)    Violent diarrhea- must  have Diflucan    Pineapple Other (See Comments)    "Issues with my tongue"   Statins Other (See Comments)    Joint pain   Tape Itching and Other (See Comments)    Tape, EKG leads, and Band-Aids - Itching and redness   Amlodipine Cough   Clavulanic Acid Nausea Only   Hctz [Hydrochlorothiazide] Rash   Lisinopril Cough   Morphine And Codeine Itching and Rash    Review of Systems As per HPI  Objective:  BP 133/63   Pulse (!) 103   Temp 98.7 F (37.1 C)   Ht 5' 4.5" (1.638 m)   Wt 275 lb (124.7 kg)   SpO2 95%   BMI 46.47 kg/m    Wt Readings from Last 3 Encounters:  10/15/23 275 lb (124.7 kg)  09/20/23 283 lb 6.4 oz (128.5 kg)  09/05/23 284 lb 9.6 oz (129.1 kg)   Physical Exam Constitutional:      General: She is awake. She is not in acute distress.    Appearance: Normal appearance. She is well-developed and well-groomed. She is morbidly obese. She is not ill-appearing, toxic-appearing or diaphoretic.  Cardiovascular:     Rate and Rhythm: Normal rate and regular rhythm.     Pulses: Normal pulses.          Radial pulses are 2+ on the right side and 2+ on the left side.       Posterior tibial pulses are 2+ on the right side and 2+ on the left side.     Heart sounds: Normal heart sounds. No murmur heard.    No gallop.  Pulmonary:     Effort: Pulmonary effort is normal. No respiratory distress.     Breath sounds: Normal breath sounds. No stridor. No wheezing, rhonchi or rales.     Comments: [place] in place  DOE   Musculoskeletal:     Cervical back: Full passive range of motion without pain and neck supple.     Right lower leg: No edema.     Left lower leg: No edema.  Skin:    General: Skin is warm.     Capillary Refill: Capillary refill takes less than 2 seconds.  Neurological:     General: No focal deficit present.     Mental Status: She is alert, oriented to person, place, and time and easily aroused. Mental status is at baseline.     GCS: GCS eye subscore is 4. GCS verbal subscore is 5.  GCS motor subscore is 6.     Motor: No weakness.  Psychiatric:        Attention and Perception: Attention and perception normal.        Mood and Affect: Mood and affect normal.        Speech: Speech normal.        Behavior: Behavior normal. Behavior is cooperative.        Thought Content: Thought content normal. Thought content does not include homicidal or suicidal ideation. Thought content does not include homicidal or suicidal plan.        Cognition and Memory: Cognition and memory normal.        Judgment: Judgment normal.     Results for orders placed or performed in visit on 09/20/23  Alpha-1-Antitrypsin Phenotyp   Collection Time: 09/20/23 11:11 AM  Result Value Ref Range   A-1 Antitrypsin 203 (H) 101 - 187 mg/dL   A-1 Antitrypsin Pheno MM   Basic metabolic panel   Collection Time:  09/20/23 11:11 AM  Result Value Ref Range   Glucose 275 (H) 70 - 99 mg/dL   BUN 9 6 - 24 mg/dL   Creatinine, Ser 7.82 0.57 - 1.00 mg/dL   eGFR 86 >95 AO/ZHY/8.65   BUN/Creatinine Ratio 11 9 - 23   Sodium 135 134 - 144 mmol/L   Potassium 3.9 3.5 - 5.2 mmol/L   Chloride 95 (L) 96 - 106 mmol/L   CO2 24 20 - 29 mmol/L   Calcium 9.1 8.7 - 10.2 mg/dL  Brain natriuretic peptide   Collection Time: 09/20/23 11:11 AM  Result Value Ref Range   BNP 43.2 0.0 - 100.0 pg/mL  CBC with Differential/Platelet   Collection Time: 09/20/23 11:11 AM  Result Value Ref Range   WBC 8.9 3.4 - 10.8 x10E3/uL   RBC 4.60 3.77 - 5.28 x10E6/uL   Hemoglobin 13.3 11.1 - 15.9 g/dL   Hematocrit 78.4 69.6 - 46.6 %   MCV 88 79 - 97 fL   MCH 28.9 26.6 - 33.0 pg   MCHC 32.9 31.5 - 35.7 g/dL   RDW 29.5 28.4 - 13.2 %   Platelets 448 150 - 450 x10E3/uL   Neutrophils 60 Not Estab. %   Lymphs 30 Not Estab. %   Monocytes 7 Not Estab. %   Eos 1 Not Estab. %   Basos 1 Not Estab. %   Neutrophils Absolute 5.4 1.4 - 7.0 x10E3/uL   Lymphocytes Absolute 2.6 0.7 - 3.1 x10E3/uL   Monocytes Absolute 0.6 0.1 - 0.9 x10E3/uL   EOS  (ABSOLUTE) 0.1 0.0 - 0.4 x10E3/uL   Basophils Absolute 0.1 0.0 - 0.2 x10E3/uL   Immature Granulocytes 1 Not Estab. %   Immature Grans (Abs) 0.1 0.0 - 0.1 x10E3/uL  D-dimer, quantitative   Collection Time: 09/20/23 11:11 AM  Result Value Ref Range   D-DIMER 0.36 0.00 - 0.49 mg/L FEU  TSH   Collection Time: 09/20/23 11:11 AM  Result Value Ref Range   TSH 1.830 0.450 - 4.500 uIU/mL  Sedimentation rate   Collection Time: 09/20/23 11:11 AM  Result Value Ref Range   Sed Rate 22 0 - 40 mm/hr  IgE   Collection Time: 09/20/23 11:11 AM  Result Value Ref Range   IgE (Immunoglobulin E), Serum 50 6 - 495 IU/mL       10/15/2023   10:36 AM 05/16/2023   10:24 AM 03/16/2023   10:47 PM 12/14/2022    3:34 PM 10/23/2022   10:54 AM  Depression screen PHQ 2/9  Decreased Interest 1 1 0 1 1  Down, Depressed, Hopeless 1 1 0 1 1  PHQ - 2 Score 2 2 0 2 2  Altered sleeping 1 1  1 1   Tired, decreased energy 1 1  1 1   Change in appetite 1 1  1 1   Feeling bad or failure about yourself  1 1  1 1   Trouble concentrating 1 1  1 1   Moving slowly or fidgety/restless 1 1  1 1   Suicidal thoughts 0 0  0 0  PHQ-9 Score 8 8  8 8   Difficult doing work/chores Somewhat difficult Somewhat difficult  Somewhat difficult Somewhat difficult       10/15/2023   10:36 AM 05/16/2023   10:24 AM 12/14/2022    3:34 PM 10/23/2022   10:55 AM  GAD 7 : Generalized Anxiety Score  Nervous, Anxious, on Edge 1 1 1 1   Control/stop worrying 1 1 1 1   Worry too much -  different things 1 1 1 1   Trouble relaxing 1 1 1 2   Restless 0 1 1 1   Easily annoyed or irritable 1 1 1 1   Afraid - awful might happen 1 1 1 1   Total GAD 7 Score 6 7 7 8   Anxiety Difficulty  Somewhat difficult Somewhat difficult Somewhat difficult   Pertinent labs & imaging results that were available during my care of the patient were reviewed by me and considered in my medical decision making.  Assessment & Plan:  Casha was seen today for medical management of  chronic issues.  Diagnoses and all orders for this visit:  1. Type 2 diabetes mellitus with diabetic polyneuropathy, with long-term current use of insulin (HCC) (Primary) Not at goal. Patient is established with Endocrinology. Will also have patient follow up with pharmacy. Reviewed notes from [place], NP 10/19/2022. - Microalbumin / creatinine urine ratio - Bayer DCA Hb A1c Waived - CMP14+EGFR  2. Hypertension associated with type 2 diabetes mellitus [facility]) Reviewed notes from [place], NP 07/01/2023. Slightly above goal in office today. Will not make medication changes at this time. Unsure why patient stopped losartan. Per notes from [place], NP patient is to continue. Patient to continue to follow up with specialty. Labs as below. Will communicate results to patient once available. Will await results to determine next steps.  - CMP14+EGFR - CBC with Differential/Platelet  3. Hyperlipidemia associated with type 2 diabetes mellitus (HCC) Established with Cardiology. Patient is not currently taking statin or zetia due to tolerance. Recommend follow up with specialty, possible referral to lipid clinic.  - Lipid panel  4. Vitamin D deficiency Labs as below. Will communicate results to patient once available. Will await results to determine next steps. Continue supplementation.  - VITAMIN D 25 Hydroxy (Vit-D Deficiency, Fractures)  5. Chronic respiratory failure with hypoxia and hypercapnia (HCC) Stable. Currently using O2, 4 Liters with ambulation, 2 Liters at night, 0 at rest during the day. Reviewed notes from Sherene Sires, MD on 09/20/2023.   6. Moderate episode of recurrent major depressive disorder (HCC) Stable. Established with [facility]. Denies SI. Continue current regimen.   7. Morbid obesity (HCC) Discussed with patient to continue healthy lifestyle choices, including diet (rich in fruits, vegetables, and lean proteins, and low in salt and simple carbohydrates) and exercise (at least 30 minutes of  moderate physical activity daily). Limit beverages high is sugar. Recommended at least 80-100 oz of water daily.   8. Other urinary incontinence Patient is taking oxybutynin, however believe that incontinence may have a component of stress incontinence. Referral placed as below. Limited in options for care as patient has transportation insecurity.  - Ambulatory referral to Physical Therapy  9. Screening for colon cancer Referral placed as below.  - Ambulatory referral to Gastroenterology  Continue all other maintenance medications.  Follow up plan: Return in about 3 months (around 01/12/2024) for Chronic Condition Follow up.  Continue healthy lifestyle choices, including diet (rich in fruits, vegetables, and lean proteins, and low in salt and simple carbohydrates) and exercise (at least 30 minutes of moderate physical activity daily).  Written and verbal instructions provided   The above assessment and management plan was discussed with the patient. The patient verbalized understanding of and has agreed to the management plan. Patient is aware to call the clinic if they develop any new symptoms or if symptoms persist or worsen. Patient is aware when to return to the clinic for a follow-up visit. Patient educated on when it is appropriate  to go to the emergency department.   Neale Burly, DNP-FNP [facility] [address] [place], [place] [address] [phone]

## 2023-10-16 ENCOUNTER — Ambulatory Visit (HOSPITAL_COMMUNITY): Payer: MEDICAID

## 2023-10-16 LAB — CMP14+EGFR
ALT: 9 IU/L (ref 0–32)
AST: 10 IU/L (ref 0–40)
Albumin: 4 g/dL (ref 3.9–4.9)
Alkaline Phosphatase: 130 IU/L — ABNORMAL HIGH (ref 44–121)
BUN/Creatinine Ratio: 9 (ref 9–23)
BUN: 8 mg/dL (ref 6–24)
Bilirubin Total: 0.4 mg/dL (ref 0.0–1.2)
CO2: 23 mmol/L (ref 20–29)
Calcium: 9.8 mg/dL (ref 8.7–10.2)
Chloride: 95 mmol/L — ABNORMAL LOW (ref 96–106)
Creatinine, Ser: 0.88 mg/dL (ref 0.57–1.00)
Globulin, Total: 3.2 g/dL (ref 1.5–4.5)
Glucose: 298 mg/dL — ABNORMAL HIGH (ref 70–99)
Potassium: 4.3 mmol/L (ref 3.5–5.2)
Sodium: 137 mmol/L (ref 134–144)
Total Protein: 7.2 g/dL (ref 6.0–8.5)
eGFR: 80 mL/min/1.73

## 2023-10-16 LAB — CBC WITH DIFFERENTIAL/PLATELET
Basophils Absolute: 0.1 10*3/uL (ref 0.0–0.2)
Basos: 1 %
EOS (ABSOLUTE): 0.2 10*3/uL (ref 0.0–0.4)
Eos: 2 %
Hematocrit: 42.2 % (ref 34.0–46.6)
Hemoglobin: 14.5 g/dL (ref 11.1–15.9)
Immature Grans (Abs): 0.1 10*3/uL (ref 0.0–0.1)
Immature Granulocytes: 1 %
Lymphocytes Absolute: 2.5 10*3/uL (ref 0.7–3.1)
Lymphs: 24 %
MCH: 29.7 pg (ref 26.6–33.0)
MCHC: 34.4 g/dL (ref 31.5–35.7)
MCV: 87 fL (ref 79–97)
Monocytes Absolute: 0.7 10*3/uL (ref 0.1–0.9)
Monocytes: 6 %
Neutrophils Absolute: 6.8 10*3/uL (ref 1.4–7.0)
Neutrophils: 66 %
Platelets: 392 10*3/uL (ref 150–450)
RBC: 4.88 x10E6/uL (ref 3.77–5.28)
RDW: 12.7 % (ref 11.7–15.4)
WBC: 10.3 10*3/uL (ref 3.4–10.8)

## 2023-10-16 LAB — MICROALBUMIN / CREATININE URINE RATIO
Creatinine, Urine: 116.8 mg/dL
Microalb/Creat Ratio: 17 mg/g{creat} (ref 0–29)
Microalbumin, Urine: 19.8 ug/mL

## 2023-10-16 LAB — VITAMIN D 25 HYDROXY (VIT D DEFICIENCY, FRACTURES): Vit D, 25-Hydroxy: 44.9 ng/mL (ref 30.0–100.0)

## 2023-10-16 LAB — LIPID PANEL
Cholesterol, Total: 181 mg/dL (ref 100–199)
HDL: 52 mg/dL (ref >39)
LDL CALC COMMENT:: 3.5 ratio (ref 0.0–4.4)
LDL Chol Calc (NIH): 107 mg/dL — ABNORMAL HIGH (ref 0–99)
Triglycerides: 122 mg/dL (ref 0–149)
VLDL Cholesterol Cal: 22 mg/dL (ref 5–40)

## 2023-10-17 ENCOUNTER — Encounter: Payer: Self-pay | Admitting: Family Medicine

## 2023-10-17 NOTE — Progress Notes (Signed)
 Elevated BG and uncontrolled diabetes. Recommend patient follow up with endocrinology. We are also reaching out to them to determine next steps. Slightly elevated alk phos, will continue to monitor. Cholesterol has improved. Continue diet and lifestyle modifications. Can refer to lipid clinic if she is willing to try medications for cholesterol. Vitamin D within normal range, continue daily supplement.

## 2023-10-18 ENCOUNTER — Telehealth: Payer: Self-pay | Admitting: *Deleted

## 2023-10-18 DIAGNOSIS — I152 Hypertension secondary to endocrine disorders: Secondary | ICD-10-CM

## 2023-10-18 NOTE — Telephone Encounter (Signed)
-----   Message from Neale Burly sent at 10/17/2023  8:01 AM EST ----- Please call and ask patient to resume losartan, I can send in refills if she needs and would like for her to restart at 50 mg. Please schedule her for lab in 2 weeks to repeat BMP. ----- Message ----- From: Collier Bullock Sent: 10/15/2023   2:32 PM EST To: Arrie Senate, FNP  Lauris Chroman, when I saw her in November 2024 she was on losartan so should restart it unless someone else stopped it for another reason? Would repeat bmet in 2 weeks. She has refused statins. I asked her to start zetia but I guess she didn't. Would only refer to lipid clinic if willing to take meds. Thanks, Elon Jester ----- Message ----- From: Arrie Senate, FNP Sent: 10/15/2023   1:17 PM EST To: Dyann Kief, PA-C  Kellie Simmering, I saw a mutual patient of ours today. Her BP was slightly above goal. However, she said that she was not currently taking her losartan. I wanted to double check with you to see if you would like her to resume it or if she was stopped for a specific reason. Also, she is not taking statin or zetia. I am happy to place a referral to lipid clinic or wanted to see if you had other recommendations. Thanks!  Jerrel Ivory

## 2023-10-18 NOTE — Telephone Encounter (Signed)
 Placed order for BMP. Patient aware that she needs to follow up in 2 weeks for lab

## 2023-10-18 NOTE — Telephone Encounter (Signed)
 Patient says she will restart losartan 50 mg.  She will call back and schedule BMP lab appt.

## 2023-10-29 ENCOUNTER — Encounter (INDEPENDENT_AMBULATORY_CARE_PROVIDER_SITE_OTHER): Payer: Self-pay | Admitting: *Deleted

## 2023-10-31 ENCOUNTER — Ambulatory Visit (HOSPITAL_COMMUNITY)
Admission: RE | Admit: 2023-10-31 | Discharge: 2023-10-31 | Disposition: A | Discharge status: Home / Self Care | Payer: MEDICAID | Source: Ambulatory Visit | Attending: Internal Medicine | Admitting: Internal Medicine

## 2023-10-31 DIAGNOSIS — J9611 Chronic respiratory failure with hypoxia: Secondary | ICD-10-CM | POA: Insufficient documentation

## 2023-10-31 DIAGNOSIS — J9612 Chronic respiratory failure with hypercapnia: Secondary | ICD-10-CM | POA: Diagnosis present

## 2023-11-01 ENCOUNTER — Encounter: Payer: Self-pay | Admitting: Internal Medicine

## 2023-11-01 ENCOUNTER — Ambulatory Visit: Payer: MEDICAID | Admitting: Internal Medicine

## 2023-11-01 VITALS — BP 155/81 | HR 80 | Ht 64.5 in | Wt 280.0 lb

## 2023-11-01 DIAGNOSIS — J9612 Chronic respiratory failure with hypercapnia: Secondary | ICD-10-CM

## 2023-11-01 DIAGNOSIS — J45991 Cough variant asthma: Secondary | ICD-10-CM | POA: Diagnosis not present

## 2023-11-01 DIAGNOSIS — J9611 Chronic respiratory failure with hypoxia: Secondary | ICD-10-CM | POA: Diagnosis not present

## 2023-11-01 DIAGNOSIS — Z6841 Body Mass Index (BMI) 40.0 and over, adult: Secondary | ICD-10-CM

## 2023-11-01 MED ORDER — GABAPENTIN 100 MG PO CAPS
100.0000 mg | ORAL_CAPSULE | Freq: Four times a day (QID) | ORAL | 2 refills | Status: DC
Start: 2023-11-01 — End: 2024-02-26

## 2023-11-01 NOTE — Patient Instructions (Addendum)
 Gabapentin 100 mg each am for a week then twice daily for a week then three times daily for a week - hold the dose at whatever dose work.  GERD (REFLUX)  is an extremely common cause of respiratory symptoms just like yours , many times with no obvious heartburn at all.    It can be treated with medication, but also with lifestyle changes including elevation of the head of your bed (ideally with 6 -8inch blocks under the headboard of your bed),  Smoking cessation, avoidance of late meals, excessive alcohol, and avoid fatty foods, chocolate, peppermint, colas, red wine, and acidic juices such as orange juice.  0 SO NO COUGH DROPS - LUDENS USE SUGARLESS CANDY INSTEAD (Jolley ranchers or [employer] or Environmental [employer]) or even ice chips will also do - the key is to swallow to prevent all throat clearing. NO OIL BASED VITAMINS - use powdered substitutes.  Avoid fish oil when coughing.   Please schedule a follow up office visit in 6 weeks, call sooner if needed

## 2023-11-01 NOTE — Assessment & Plan Note (Signed)
 MM/ Onset sept 2023 with neg response to rx for asthma - 08/08/2023  After extensive coaching inhaler device,  effectiveness =    75% from a baseline of 25 % with clear lung exam so try off incruse and decrease symbicort to 80 2bid and max rx for gerd - Allergy screen 08/08/23  >  Eos 0.1 /  IgE  54  alpha one MM  level 203  - D /C mints 11/01/2023  -  Gabapentin 100 mg every day titrate to 100 mg qid over 4 weeks 11/01/2023 >>>   Strongly doubt significant asthma here > more likely  Upper airway cough syndrome (previously labeled PNDS),  is so named because it's frequently impossible to sort out how much is  CR/sinusitis with freq throat clearing (which can be related to primary GERD)   vs  causing  secondary (" extra esophageal")  GERD from wide swings in gastric pressure that occur with throat clearing, often  promoting self use of mint and menthol lozenges that reduce the lower esophageal sphincter tone and exacerbate the problem further in a cyclical fashion.   These are the same pts (now being labeled as having "irritable larynx syndrome" by some cough centers) who not infrequently have a history of having failed to tolerate ace inhibitors,  dry powder inhalers or biphosphonates or report having atypical/extraesophageal reflux symptoms(LPR)  that don't respond to standard doses of PPI  and are easily confused as having aecopd or asthma flares by even experienced allergists/ pulmonologists (myself included).   Rx as above/ regroup in 6 weeks

## 2023-11-01 NOTE — Assessment & Plan Note (Addendum)
 02 dep since pna/chf admit 02/2023  - [facility]    07/23/23  = 33  - 08/08/2023   Walked on RA  with 93% initially but w/in 10 ft desat so placed on 4lpm x  150 ft slow pace and lowest sats 91%  - 09/20/2023   Walked on 4lpm   x  1  lap(s) =  approx 150  ft  @ slow pace, stopped due to sob with lowest 02 sats 90%   - HRCT  10/31/23 >>>  -  11/01/2023   Walked on 2lpm cont   x  2  lap(s) =  approx 300  ft  @ very slow  pace, stopped due to tired/ back pain  with lowest 02 sats 91%    Presently more limited by back and MO/ conditioning than by lung dz   Rec No change rx Await results of HRCT

## 2023-11-01 NOTE — Progress Notes (Signed)
 Kathryn Jimenez, female    DOB: [birth year]    MRN: [id number]   Brief patient profile:  61  yowf never smoker grew up around smokers with lots of resp infections / stuffy head and "bronchitis" dx as asthma age 84s   referred to pulmonary clinic in [place]  08/08/2023   for cough x sept 2023  and 02 dep after pna  02/2023      History of Present Illness  08/08/2023  Pulmonary/ 1st office eval/ [facility] Meline / [place] [facility] symbicort 160 / incruse/singulair > very dry mouth and nose (not on humidified 02)  Chief Complaint  Patient presents with   Establish Care   Shortness of Breath    On O2/Ada[employer], 4-6L w/ambulation, 2L at rest  Dyspnea:  only goes out to go to doctor's office last walked at [facility]  2lpm at rest and needed up to 6opm  walking  Cough: varies sometimes wakes her up non productive and quite violent assoc with urinary incont and midline cp but no gagging Sleep: 45 degrees in recliner  SABA use: hfa couple times a week / no neb  02: sept 2023 p hosp > restarted back on it sev months prior to OV  p admit with dx cap/ chf. Rec Add water to your oxygen equipment  Pantoprazole (protonix) 40 mg   Take  30-60 min before first meal of the day and Pepcid (famotidine)  20 mg after supper until return to office - this is the best way to tell whether stomach acid is contributing to your problem.   Use sugarless jolly ranchers to suppress urge to cough  Plan A = Automatic = Always=    Symbicort 80 1-2 puffs every 12 hours (take one if coughing and 2 if breathing worse but goal is to use the lower dose if possible)  Plan B = Backup (to supplement plan A, not to replace it) Only use your albuterol inhaler as a rescue medication Depomedrol 120 mg IM today  We will walk you today to establish new 0xygen needs  Please schedule a follow up office visit in 6 weeks, call sooner if needed with all medications /inhalers/ solutions in hand     Allergy screen 08/08/23  >  Eos 0.1 /  IgE   54    09/20/2023  f/u ov/[place] office/Shelonda Saxe re: asthma/ 02 dep  maint on symbicort 80  did  bring meds  Chief Complaint  Patient presents with   Follow-up    Follow up   Dyspnea:  dry mouth is better / doing more walking since last ov  Cough: with insp or walking  Sleeping: recliner at lower angle s  resp cc > could not tol cpap in past and says last studies were neg for osa anyway  SABA use: twice a week avg 02: 2lpm at bedtime / at rest  and prn walking  Rec Make sure you check your oxygen saturation  AT  your highest level of activity (not after you stop)   to be sure it stays over 90%   Try off symbicort to see if your breathing when you walk is worse or your   need for rescue inhaler goes up in which case you should go back to Take 2 puffs first thing in am and then another 2 puffs about 12 hours later.   Allergy screen 08/08/23  >  Eos 0.1 /  IgE  54  alpha one MM  level 203  11/01/2023  f/u ov/[place] office/Zayla Agar re: cough variant asthma  maint on symbicort 1-2 bid   Chief Complaint  Patient presents with   Follow-up    6 week follow up   Dyspnea:  no sustained exercise  Cough: severe with certain smells or talking or laughing / using altoids Sleeping: recliner 30 degrees no resp cc until stir and starts throat clearing  SABA use: rarely  02: 2lpm sleeping/   leaves off at rest with sats  ok / needs 3-4 lpm continuous when  walking    No obvious day to day or daytime variability or assoc excess/ purulent sputum or mucus plugs or hemoptysis or cp or chest tightness, subjective wheeze or overt sinus or hb symptoms.    Also denies any obvious fluctuation of symptoms with weather or environmental changes or other aggravating or alleviating factors except as outlined above   No unusual exposure hx or h/o childhood pna/ asthma or knowledge of premature birth.  Current Allergies, Complete Past Medical History, Past Surgical History, Family History, and Social History were  reviewed in [employer] record.  ROS  The following are not active complaints unless bolded Hoarseness, sore throat, dysphagia= globus, dental problems, itching, sneezing,  nasal congestion or discharge of excess mucus or purulent secretions, ear ache,   fever, chills, sweats, unintended wt loss or wt gain, classically pleuritic or exertional cp,  orthopnea pnd or arm/hand swelling  or leg swelling, presyncope, palpitations, abdominal pain, anorexia, nausea, vomiting, diarrhea  or change in bowel habits or change in bladder habits, change in stools or change in urine, dysuria, hematuria,  rash, arthralgias, visual complaints, headache, numbness, weakness or ataxia or problems with walking or coordination,  change in mood or  memory.        Current Meds  Medication Sig   acetaminophen (TYLENOL) 500 MG tablet Take 500 mg by mouth every 6 (six) hours as needed for mild pain or moderate pain.   albuterol (VENTOLIN HFA) 108 (90 Base) MCG/ACT inhaler inhale two puffs into the lungs every 6 (six) hours as needed for wheezing.   budesonide-formoterol (SYMBICORT) 80-4.5 MCG/ACT inhaler Take 2 puffs first thing in am and then another 2 puffs about 12 hours later.   buPROPion (WELLBUTRIN XL) 150 MG 24 hr tablet Take 150 mg by mouth daily.   clopidogrel (PLAVIX) 75 MG tablet Take 1 tablet (75 mg total) by mouth daily.   clotrimazole-betamethasone (LOTRISONE) cream Apply thin layer to affected external area up to twice daily as needed   cycloSPORINE (RESTASIS) 0.05 % ophthalmic emulsion Place 1 drop into both eyes 2 (two) times daily.   diphenhydrAMINE (BENADRYL) 25 mg capsule Take 25 mg by mouth every 6 (six) hours as needed for allergies.   famotidine (PEPCID) 20 MG tablet One after supper   fluconazole (DIFLUCAN) 150 MG tablet Take one pill every 3 days   FLUoxetine (PROZAC) 40 MG capsule Take 40 mg by mouth daily.   insulin aspart (NOVOLOG FLEXPEN) 100 UNIT/ML FlexPen Inject 14-20  Units into the skin 3 (three) times daily with meals.   insulin glargine (LANTUS SOLOSTAR) 100 UNIT/ML Solostar Pen Inject 35 Units into the skin at bedtime.   levonorgestrel (LILETTA, 52 MG,) 20.1 MCG/DAY IUD 1 each by Intrauterine route once. Inserted 04/19/21   loperamide (IMODIUM) 2 MG capsule Take 2 mg by mouth as needed for diarrhea or loose stools.   loratadine (CLARITIN) 10 MG tablet Take 1 tablet (10 mg total) by mouth daily.  LORazepam (ATIVAN) 0.5 MG tablet Take 0.25-0.5 mg by mouth daily as needed for anxiety.   metoprolol succinate (TOPROL-XL) 50 MG 24 hr tablet Take 1 tablet (50 mg total) by mouth daily. Take with or immediately following a meal.   montelukast (SINGULAIR) 10 MG tablet Take 1 tablet (10 mg total) by mouth at bedtime.   NON FORMULARY Vit d3 with K2 taking once a day   nystatin (MYCOSTATIN/NYSTOP) powder Apply 1 application  topically daily as needed (for fungal infections).   oxybutynin (DITROPAN-XL) 10 MG 24 hr tablet Take 1 tablet (10 mg total) by mouth at bedtime. **NEEDS TO BE SEEN BEFORE NEXT REFILL**   OXYGEN Inhale 4 L/min into the lungs continuous.   pantoprazole (PROTONIX) 40 MG tablet Take 1 tablet (40 mg total) by mouth daily. Take 30-60 min before first meal of the day   potassium chloride (KLOR-CON M) 10 MEQ tablet Take 10 mEq by mouth 2 (two) times daily.   valACYclovir (VALTREX) 500 MG tablet Take 1 tablet (500 mg total) by mouth daily.             Past Medical History:  Diagnosis Date   Acute on chronic congestive heart failure (HCC) 03/16/2023   Allergy    Anginal pain (HCC)    Anxiety    Arthritis    Asthma    Bladder disorder, other    Borderline personality disorder (HCC)    CAD (coronary artery disease) 03/16/2023   Carotid artery disease (HCC) 03/05/2017   Depression    Diabetes mellitus without complication (HCC)    GERD (gastroesophageal reflux disease)    Headache    Hyperlipidemia    Hypertension    Kidney stones    Morbid  obesity with BMI of 50.0-59.9, adult (HCC) 01/12/2020   Mucous retention cyst of maxillary sinus 08/31/2021   Noted on head CT   Neuromuscular disorder (HCC)    Neuropathy   PTSD (post-traumatic stress disorder)    Shortness of breath    Sleep apnea    Stroke (HCC) 2018   Thyroid disease    nodule      Objective:    Wts   11/01/2023       280  09/20/23 283 lb 6.4 oz (128.5 kg)  09/05/23 284 lb 9.6 oz (129.1 kg)  08/08/23 279 lb (126.6 kg)    Vital signs reviewed  11/01/2023  - Note at rest 02 sats  91% on 2lpm cont    General appearance:    amb MO (by bmi) wf nad     HEENT : Oropharynx  clear      Nasal turbinates nl    NECK :  without  apparent JVD/ palpable Nodes/TM    LUNGS: no acc muscle use,  Nl contour chest which is clear to A and P bilaterally without cough on insp or exp maneuvers   CV:  RRR  no s3 or murmur or increase in P2, and no edema   ABD:  quite obese soft and nontender   MS:  Gait nl   ext warm without deformities Or obvious joint restrictions  calf tenderness, cyanosis or clubbing    SKIN: warm and dry without lesions    NEURO:  alert, approp, nl sensorium with  no motor or cerebellar deficits apparent.       CXR PA and Lateral:   09/20/2023 :    I personally reviewed images and agree with radiology impression as follows:    Nonspecific interstitial  prominence consistent with atypical infection, edema or interstitial lung disease. No focal consolidation. My review: markings are non specific but definitely increased  Lab 09/20/2023  Nl TSH, d dimer, alpha 1 AT level, BNP  ESR 22   Review of CTa 01/18/23  1. No pulmonary embolism.  2. Mosaic attenuation secondary to diffuse heterogeneously  distributed ground-glass pulmonary infiltrate as described above..  In the acute setting, this may be seen in the setting of atypical  infection including viral or Mycoplasma pneumonia. Differential  considerations in the subacute to chronic setting  would include non  fibrotic hypersensitivity pneumonitis, smoking related lung disease,  or inflammatory infiltrates in the setting of sarcoidosis.  3. Shotty mediastinal and hilar adenopathy, nonspecific, possibly  reactive or inflammatory in nature.  4. Mild coronary artery calcification.     Assessment

## 2023-11-01 NOTE — Assessment & Plan Note (Signed)
 Body mass index is 47.32 kg/m.  -  trending down slightly  Lab Results  Component Value Date   TSH 1.830 09/20/2023      Contributing to doe and risk of GERD/dvt / pe  >>>   reviewed the need and the process to achieve and maintain neg calorie balance > defer f/u primary care including intermittently monitoring thyroid status      Each maintenance medication was reviewed in detail including emphasizing most importantly the difference between maintenance and prns and under what circumstances the prns are to be triggered using an action plan format where appropriate.  Total time for H and P, chart review, counseling, reviewing hfa  device(s) , directly observing portions of ambulatory 02 saturation study/ and generating customized AVS unique to this office visit / same day charting = 35 min

## 2023-11-02 ENCOUNTER — Other Ambulatory Visit: Payer: Self-pay | Admitting: Family Medicine

## 2023-11-07 ENCOUNTER — Other Ambulatory Visit: Payer: Self-pay

## 2023-11-07 DIAGNOSIS — E119 Type 2 diabetes mellitus without complications: Secondary | ICD-10-CM

## 2023-11-07 MED ORDER — ACCU-CHEK GUIDE TEST VI STRP: ORAL_STRIP | 0 refills | Status: DC

## 2023-11-07 MED ORDER — NOVOLOG FLEXPEN 100 UNIT/ML ~~LOC~~ SOPN: 14.0000 [IU] | PEN_INJECTOR | Freq: Three times a day (TID) | SUBCUTANEOUS | 0 refills | Status: DC

## 2023-11-07 MED ORDER — ACCU-CHEK SOFTCLIX LANCETS MISC: 0 refills | Status: DC

## 2023-11-07 MED ORDER — LANTUS SOLOSTAR 100 UNIT/ML ~~LOC~~ SOPN: 35.0000 [IU] | PEN_INJECTOR | Freq: Every day | SUBCUTANEOUS | 0 refills | Status: DC

## 2023-11-08 ENCOUNTER — Other Ambulatory Visit: Payer: Self-pay | Admitting: Internal Medicine

## 2023-11-08 DIAGNOSIS — J45991 Cough variant asthma: Secondary | ICD-10-CM

## 2023-11-18 ENCOUNTER — Telehealth: Payer: Self-pay | Admitting: Family Medicine

## 2023-11-18 NOTE — Telephone Encounter (Signed)
 Form has not been received by any company

## 2023-11-18 NOTE — Telephone Encounter (Signed)
 Copied from CRM [id number]. Topic: Clinical - Prescription Issue >> Nov 18, 2023 12:11 PM Carlatta H wrote: Reason for CRM: Please send CMN/PA form for incontinence supplies to be covered by insurance//

## 2023-11-23 ENCOUNTER — Encounter: Payer: Self-pay | Admitting: Internal Medicine

## 2023-11-23 DIAGNOSIS — J8489 Other specified interstitial pulmonary diseases: Secondary | ICD-10-CM | POA: Insufficient documentation

## 2023-11-25 ENCOUNTER — Telehealth: Payer: Self-pay | Admitting: Family Medicine

## 2023-11-27 ENCOUNTER — Encounter: Payer: Self-pay | Admitting: Family Medicine

## 2023-12-12 ENCOUNTER — Other Ambulatory Visit: Payer: Self-pay | Admitting: Family Medicine

## 2023-12-12 ENCOUNTER — Other Ambulatory Visit: Payer: Self-pay | Admitting: Internal Medicine

## 2023-12-12 DIAGNOSIS — N39498 Other specified urinary incontinence: Secondary | ICD-10-CM

## 2023-12-12 DIAGNOSIS — J45991 Cough variant asthma: Secondary | ICD-10-CM

## 2023-12-15 NOTE — Progress Notes (Unsigned)
 Kathryn Jimenez, female    DOB: 07-14-1971    MRN: [id number]   Brief patient profile:  58  yowf never smoker grew up around smokers with lots of resp infections / stuffy head and "bronchitis" dx as asthma age 33s   referred to pulmonary clinic in [place]  08/08/2023   for cough x sept 2023  and 02 dep after pna  02/2023      History of Present Illness  08/08/2023  Pulmonary/ 1st office eval/ Galia Rahm / [place] [facility] symbicort  160 / incruse/singulair  > very dry mouth and nose (not on humidified 02)  Chief Complaint  Patient presents with   Establish Care   Shortness of Breath    On O2/[employer], 4-6L w/ambulation, 2L at rest  Dyspnea:  only goes out to go to doctor's office last walked at [facility]  2lpm at rest and needed up to 6opm  walking  Cough: varies sometimes wakes her up non productive and quite violent assoc with urinary incont and midline cp but no gagging Sleep: 45 degrees in recliner  SABA use: hfa couple times a week / no neb  02: sept 2023 p hosp > restarted back on it sev months prior to OV  p admit with dx cap/ chf. Rec Add water  to your oxygen  equipment  Pantoprazole  (protonix ) 40 mg   Take  30-60 min before first meal of the day and Pepcid  (famotidine )  20 mg after supper until return to office - this is the best way to tell whether stomach acid is contributing to your problem.   Use sugarless jolly ranchers to suppress urge to cough  Plan A = Automatic = Always=    Symbicort  80 1-2 puffs every 12 hours (take one if coughing and 2 if breathing worse but goal is to use the lower dose if possible)  Plan B  = Backup (to supplement plan A, not to replace it) Only use your albuterol  inhaler as a rescue medication Depomedrol 120 mg IM today  We will walk you today to establish new 0xygen needs  Please schedule a follow up office visit in 6 weeks, call sooner if needed with all medications /inhalers/ solutions in hand     Allergy screen 08/08/23  >  Eos 0.1 /  IgE   54    09/20/2023  f/u ov/[place] office/Berry Gallacher re: asthma/ 02 dep  maint on symbicort  80  did  bring meds  Chief Complaint  Patient presents with   Follow-up    Follow up   Dyspnea:  dry mouth is better / doing more walking since last ov  Cough: with insp or walking  Sleeping: recliner at lower angle s  resp cc > could not tol cpap in past and says last studies were neg for osa anyway  SABA use: twice a week avg 02: 2lpm at bedtime / at rest  and prn walking  Rec Make sure you check your oxygen  saturation  AT  your highest level of activity (not after you stop)   to be sure it stays over 90%   Try off symbicort  to see if your breathing when you walk is worse or your   need for rescue inhaler goes up in which case you should go back to Take 2 puffs first thing in am and then another 2 puffs about 12 hours later.   Allergy screen 08/08/23  >  Eos 0.1 /  IgE  54  alpha one MM  level 203  11/01/2023  f/u ov/[place] office/Berenise Hunton re: cough variant asthma  maint on symbicort  1-2 bid   Chief Complaint  Patient presents with   Follow-up    6 week follow up   Dyspnea:  no sustained exercise  Cough: severe with certain smells or talking or laughing / using altoids Sleeping: recliner 30 degrees no resp cc until stir and starts throat clearing  SABA use: rarely  02: 2lpm sleeping/   leaves off at rest with sats  ok / needs 3-4 lpm continuous when  walking  Rec Gabapentin  100 mg each am for a week then twice daily for a week then three times daily for a week - hold the dose at whatever dose work. GERD diet reviewed, bed blocks rec      12/16/2023  f/u ov/[place] office/Kinzi Frediani re: cough variant asthma vs UACS plus PF non specific c/w NSP  maint on symbicort   80 / gabapentin  100 mg 4 x daily  with cough slt improved but still constant daytime urge to clear throat for "congestion which is dry"  Chief Complaint  Patient presents with   Shortness of Breath  Dyspnea:  mb x 2007 x 75 ft  flat on 3lpm 87%sats - using rollator at [employer] >using to sit cause back gives out  Cough: ? Some better from change altoid to [employer] and rx with gabapentin  Sleeping: recliner leaning back flatter now with less noct resp cc  SABA use: not needing  02: 2lpm hs up to 4lpm with activity    No obvious day to day or daytime variability or assoc excess/ purulent sputum or mucus plugs or hemoptysis or cp or chest tightness, subjective wheeze or overt sinus or hb symptoms.    Also denies any obvious fluctuation of symptoms with weather or environmental changes or other aggravating or alleviating factors except as outlined above   No unusual exposure hx or h/o childhood pna/ asthma or knowledge of premature birth.  Current Allergies, Complete Past Medical History, Past Surgical History, Family History, and Social History were reviewed in [employer] record.  ROS  The following are not active complaints unless bolded Hoarseness, sore throat, dysphagia= globus, dental problems, itching, sneezing,  nasal congestion or discharge of excess mucus or purulent secretions, ear ache,   fever, chills, sweats, unintended wt loss or wt gain, classically pleuritic or exertional cp,  orthopnea pnd or arm/hand swelling  or leg swelling, presyncope, palpitations, abdominal pain, anorexia, nausea, vomiting, diarrhea  or change in bowel habits or change in bladder habits, change in stools or change in urine, dysuria, hematuria,  rash, arthralgias, visual complaints, headache, numbness, weakness or ataxia or problems with walking or coordination,  change in mood or  memory.        Current Meds  Medication Sig   potassium chloride  (KLOR-CON ) 10 MEQ tablet Take 10 mEq by mouth 2 (two) times daily.   Accu-Chek Softclix Lancets lancets Use to check blood glucose four times daily as instructed   acetaminophen  (TYLENOL ) 500 MG tablet Take 500 mg by mouth every 6 (six) hours as needed for mild  pain or moderate pain.   albuterol  (VENTOLIN  HFA) 108 (90 Base) MCG/ACT inhaler inhale two puffs into the lungs every 6 (six) hours as needed for wheezing.   budesonide -formoterol  (SYMBICORT ) 80-4.5 MCG/ACT inhaler Take 2 puffs first thing in am and then another 2 puffs about 12 hours later.   buPROPion  (WELLBUTRIN  XL) 150 MG 24 hr tablet Take 150 mg by mouth  daily.   clopidogrel  (PLAVIX ) 75 MG tablet Take 1 tablet (75 mg total) by mouth daily.   clotrimazole -betamethasone  (LOTRISONE ) cream Apply thin layer to affected external area up to twice daily as needed   cycloSPORINE  (RESTASIS ) 0.05 % ophthalmic emulsion Place 1 drop into both eyes 2 (two) times daily.   diphenhydrAMINE (BENADRYL) 25 mg capsule Take 25 mg by mouth every 6 (six) hours as needed for allergies.   famotidine  (PEPCID ) 20 MG tablet One after supper   fluconazole  (DIFLUCAN ) 150 MG tablet Take one pill every 3 days   FLUoxetine  (PROZAC ) 40 MG capsule Take 40 mg by mouth daily.   gabapentin  (NEURONTIN ) 100 MG capsule Take 1 capsule (100 mg total) by mouth 4 (four) times daily.   glucose blood (ACCU-CHEK GUIDE TEST) test strip Use to check blood glucose four times daily as instructed   insulin  aspart (NOVOLOG  FLEXPEN) 100 UNIT/ML FlexPen Inject 14-20 Units into the skin 3 (three) times daily with meals.   insulin  glargine (LANTUS  SOLOSTAR) 100 UNIT/ML Solostar Pen Inject 35 Units into the skin at bedtime.   levonorgestrel  (LILETTA , 52 MG,) 20.1 MCG/DAY IUD 1 each by Intrauterine route once. Inserted 04/19/21   loperamide  (IMODIUM ) 2 MG capsule Take 2 mg by mouth as needed for diarrhea or loose stools.   loratadine  (CLARITIN ) 10 MG tablet Take 1 tablet (10 mg total) by mouth daily.   LORazepam  (ATIVAN ) 0.5 MG tablet Take 0.25-0.5 mg by mouth daily as needed for anxiety.   metoprolol  succinate (TOPROL -XL) 50 MG 24 hr tablet Take 1 tablet (50 mg total) by mouth daily. Take with or immediately following a meal.   montelukast  (SINGULAIR )  10 MG tablet Take 1 tablet (10 mg total) by mouth at bedtime.   NON FORMULARY Vit d3 with K2 taking once a day   nystatin  (MYCOSTATIN /NYSTOP ) powder Apply 1 application  topically daily as needed (for fungal infections).   oxybutynin  (DITROPAN -XL) 10 MG 24 hr tablet Take 1 tablet (10 mg total) by mouth at bedtime.   OXYGEN  Inhale 4 L/min into the lungs continuous.   pantoprazole  (PROTONIX ) 40 MG tablet take 1 tablet (40 milligram total) by mouth daily. take 30-60 min before first meal of the day   valACYclovir  (VALTREX ) 500 MG tablet Take 1 tablet (500 mg total) by mouth daily.              Past Medical History:  Diagnosis Date   Acute on chronic congestive heart failure (HCC) 03/16/2023   Allergy    Anginal pain (HCC)    Anxiety    Arthritis    Asthma    Bladder disorder, other    Borderline personality disorder (HCC)    CAD (coronary artery disease) 03/16/2023   Carotid artery disease (HCC) 03/05/2017   Depression    Diabetes mellitus without complication (HCC)    GERD (gastroesophageal reflux disease)    Headache    Hyperlipidemia    Hypertension    Kidney stones    Morbid obesity with BMI of 50.0-59.9, adult 01/12/2020   Mucous retention cyst of maxillary sinus 08/31/2021   Noted on head CT   Neuromuscular disorder (HCC)    Neuropathy   PTSD (post-traumatic stress disorder)    Shortness of breath    Sleep apnea    Stroke (HCC) 2018   Thyroid  disease    nodule       Objective:    Wts  12/16/2023       286   11/01/2023  280  09/20/23 283 lb 6.4 oz (128.5 kg)  09/05/23 284 lb 9.6 oz (129.1 kg)  08/08/23 279 lb (126.6 kg)    Vital signs reviewed  12/16/2023  - Note at rest 02 sats  91% on 4lpm cont   General appearance:    MO (by bmi) mb wf nad/ using rollator    HEENT : Oropharynx  clear / no evidence pnds/ still freq throat clearing      Nasal turbinates nl    NECK :  without  apparent JVD/ palpable Nodes/TM    LUNGS: no acc muscle use,  Nl  contour chest which is clear to A and P bilaterally without cough on insp or exp maneuvers   CV:  RRR  no s3 or murmur or increase in P2, and trace pitting both LEs  ABD:  obese soft and nontender   MS:   ext warm without deformities Or obvious joint restrictions  calf tenderness, cyanosis or clubbing    SKIN: warm and dry without lesions    NEURO:  alert, approp, nl sensorium with  no motor or cerebellar deficits apparent.      I personally reviewed images and agree with radiology impression as follows:   HRCT   10/31/23       Spectrum of findings compatible with fibrotic interstitial lung disease with an upper lobe predominance and relative sparing of the lower lobes. No honeycombing. Mild patchy air trapping in both lungs. Findings are most compatible with chronic hypersensitivity Pneumonitis  Assessment

## 2023-12-16 ENCOUNTER — Encounter: Payer: Self-pay | Admitting: Internal Medicine

## 2023-12-16 ENCOUNTER — Ambulatory Visit: Payer: MEDICAID | Admitting: Internal Medicine

## 2023-12-16 VITALS — BP 148/68 | HR 81 | Ht 64.5 in | Wt 286.0 lb

## 2023-12-16 DIAGNOSIS — J8489 Other specified interstitial pulmonary diseases: Secondary | ICD-10-CM | POA: Diagnosis not present

## 2023-12-16 DIAGNOSIS — J9612 Chronic respiratory failure with hypercapnia: Secondary | ICD-10-CM

## 2023-12-16 DIAGNOSIS — R059 Cough, unspecified: Secondary | ICD-10-CM

## 2023-12-16 DIAGNOSIS — Z6841 Body Mass Index (BMI) 40.0 and over, adult: Secondary | ICD-10-CM

## 2023-12-16 DIAGNOSIS — J45991 Cough variant asthma: Secondary | ICD-10-CM

## 2023-12-16 DIAGNOSIS — J9611 Chronic respiratory failure with hypoxia: Secondary | ICD-10-CM

## 2023-12-16 DIAGNOSIS — R0609 Other forms of dyspnea: Secondary | ICD-10-CM

## 2023-12-16 MED ORDER — GABAPENTIN 300 MG PO CAPS: 300.0000 mg | ORAL_CAPSULE | Freq: Two times a day (BID) | ORAL | 2 refills | Status: DC

## 2023-12-16 NOTE — Patient Instructions (Addendum)
 Please remember to go to the lab department   for your tests - we will call you with the results when they are available.      Make sure you check your oxygen  saturation  AT  your highest level of activity (not after you stop)   to be sure it stays over 90% and adjust  02 flow upward to maintain this level if needed but remember to turn it back to previous settings when you stop (to conserve your supply).   Ok to try albuterol  15 min before an activity (on alternating days)  that you know would usually make you short of breath and see if it makes any difference and if makes none then don't take albuterol  after activity unless you can't catch your breath as this means it's the resting that helps, not the albuterol .   Increase the gabapentin  to 300 mg twice daily if not satisfied with 500 mg per day  in terms of eliminating your throat clearing.   My office will be contacting you by phone for referral to allergy   - if you don't hear back from my office within one week please call us  back or notify us  thru MyChart and we'll address it right away.    Please schedule a follow up visit in 3 months but call sooner if needed

## 2023-12-17 NOTE — Assessment & Plan Note (Signed)
 02 dep since pna/chf admit 02/2023  - HC03    07/23/23  = 33  - 08/08/2023   Walked on RA  with 93% initially but w/in 10 ft desat so placed on 4lpm x  150 ft slow pace and lowest sats 91%  - 09/20/2023   Walked on 4lpm   x  1  lap(s) =  approx 150  ft  @ slow pace, stopped due to sob with lowest 02 sats 90%   - HRCT  10/31/23 >>>  -  11/01/2023   Walked on 2lpm cont   x  2  lap(s) =  approx 300  ft  @ very slow  pace, stopped due to tired/ back pain  with lowest 02 sats 91%    Again advised:  Make sure you check your oxygen  saturation  AT  your highest level of activity (not after you stop)   to be sure it stays over 90% and adjust  02 flow upward to maintain this level if needed but remember to turn it back to previous settings when you stop (to conserve your supply).

## 2023-12-17 NOTE — Assessment & Plan Note (Signed)
 Body mass index is 48.33 kg/m.  -  trending up again  Lab Results  Component Value Date   TSH 1.830 09/20/2023      Contributing to doe and risk of GERD/ dvt/ pe / OHS  >>>   reviewed the need and the process to achieve and maintain neg calorie balance > defer f/u primary care including intermittently monitoring thyroid  status           Each maintenance medication was reviewed in detail including emphasizing most importantly the difference between maintenance and prns and under what circumstances the prns are to be triggered using an action plan format where appropriate.  Total time for H and P, chart review, counseling, reviewing hfa device(s) and generating customized AVS unique to this office visit / same day charting =  40 min for multiple  chronic refractory respiratory  symptoms of uncertain etiology

## 2023-12-17 NOTE — Assessment & Plan Note (Signed)
 HRCT  10/31/23  c/w NSIP / ? HSP -  HSP serology 12/17/2023   This explains the chronic 02 dep but no obvious source now for the NSP - note did have bird exp indoors "years ago" but no symptoms then and no exp since nor to hot tubs or humidifiers.  >>> if progressive will need referral to PF clinic.

## 2023-12-17 NOTE — Assessment & Plan Note (Addendum)
 MM/ Onset sept 2023 with neg response to rx for asthma - 08/08/2023  After extensive coaching inhaler device,  effectiveness =    75% from a baseline of 25 % with clear lung exam so try off incruse and decrease symbicort  to 80 2bid and max rx for gerd - Allergy screen 08/08/23  >  Eos 0.1 /  IgE  54  alpha one MM  level 203  - D /C mints 11/01/2023  -  Gabapentin  100 mg every day titrate to 100 mg qid over 4 weeks 11/01/2023 >>>  improved but still constant daytime throat clearing so titrate up to 300 mg bid   Will also continue low dose symbicort  for now and reminded her it's still ok to use albuterol  pre exertion to see if the additional Beta stimulation helps with ex tol.   >>> referred to allergy at her request for constant sensation of pnds / recheck IgE and Eos in meantime but nold on further steroid rx given wt concerns

## 2023-12-18 ENCOUNTER — Encounter: Payer: Self-pay | Admitting: Family Medicine

## 2023-12-19 LAB — CBC WITH DIFFERENTIAL/PLATELET
Basophils Absolute: 0.1 10*3/uL (ref 0.0–0.2)
Basos: 1 %
EOS (ABSOLUTE): 0.2 10*3/uL (ref 0.0–0.4)
Eos: 2 %
Hematocrit: 41.4 % (ref 34.0–46.6)
Hemoglobin: 13.2 g/dL (ref 11.1–15.9)
Immature Grans (Abs): 0 10*3/uL (ref 0.0–0.1)
Immature Granulocytes: 1 %
Lymphocytes Absolute: 2.6 10*3/uL (ref 0.7–3.1)
Lymphs: 30 %
MCH: 27.6 pg (ref 26.6–33.0)
MCHC: 31.9 g/dL (ref 31.5–35.7)
MCV: 86 fL (ref 79–97)
Monocytes Absolute: 0.6 10*3/uL (ref 0.1–0.9)
Monocytes: 6 %
Neutrophils Absolute: 5.3 10*3/uL (ref 1.4–7.0)
Neutrophils: 60 %
Platelets: 426 10*3/uL (ref 150–450)
RBC: 4.79 x10E6/uL (ref 3.77–5.28)
RDW: 12.9 % (ref 11.7–15.4)
WBC: 8.8 10*3/uL (ref 3.4–10.8)

## 2023-12-19 LAB — IGE: IgE (Immunoglobulin E), Serum: 37 [IU]/mL (ref 6–495)

## 2023-12-24 ENCOUNTER — Telehealth: Payer: Self-pay

## 2023-12-24 NOTE — Telephone Encounter (Signed)
 Reviewed lab results with patient. Provided [facility] phone number to patient. Nothing further needed at this time.

## 2023-12-24 NOTE — Telephone Encounter (Signed)
 Copied from CRM [id number]. Topic: Clinical - Lab/Test Results >> Dec 24, 2023  9:26 AM Justina Oman C wrote: Reason for CRM: Patient [phone] has not heard back from the office on referral for allergies and test results, patient can see the results in Mychart but does not understand it without Dr. Waymond Hailey advisement. Please call back.   No result notes attached to labs. Please advise, thank you!

## 2023-12-30 ENCOUNTER — Encounter: Payer: Self-pay | Admitting: Nurse Practitioner

## 2023-12-30 ENCOUNTER — Ambulatory Visit (INDEPENDENT_AMBULATORY_CARE_PROVIDER_SITE_OTHER): Payer: MEDICAID | Admitting: Nurse Practitioner

## 2023-12-30 VITALS — BP 120/70 | HR 82 | Ht 64.5 in | Wt 294.0 lb

## 2023-12-30 DIAGNOSIS — I10 Essential (primary) hypertension: Secondary | ICD-10-CM

## 2023-12-30 DIAGNOSIS — Z794 Long term (current) use of insulin: Secondary | ICD-10-CM

## 2023-12-30 DIAGNOSIS — E782 Mixed hyperlipidemia: Secondary | ICD-10-CM

## 2023-12-30 DIAGNOSIS — E119 Type 2 diabetes mellitus without complications: Secondary | ICD-10-CM | POA: Diagnosis not present

## 2023-12-30 MED ORDER — ACCU-CHEK GUIDE TEST VI STRP: ORAL_STRIP | 6 refills | Status: AC

## 2023-12-30 MED ORDER — PEN NEEDLES 31G X 6 MM MISC: 6 refills | Status: AC

## 2023-12-30 MED ORDER — NOVOLOG FLEXPEN 100 UNIT/ML ~~LOC~~ SOPN: 14.0000 [IU] | PEN_INJECTOR | Freq: Three times a day (TID) | SUBCUTANEOUS | 3 refills | Status: DC

## 2023-12-30 MED ORDER — DEXCOM G7 SENSOR MISC
1.0000 | 3 refills | Status: AC
Start: 2023-12-30 — End: ?

## 2023-12-30 MED ORDER — LANTUS SOLOSTAR 100 UNIT/ML ~~LOC~~ SOPN: 35.0000 [IU] | PEN_INJECTOR | Freq: Every day | SUBCUTANEOUS | 3 refills | Status: AC

## 2023-12-30 MED ORDER — ACCU-CHEK SOFTCLIX LANCETS MISC: 6 refills | Status: AC

## 2023-12-30 NOTE — Progress Notes (Signed)
 Endocrinology Follow Up Note       12/30/2023, 1:37 PM   Subjective:    Patient ID: Kathryn Jimenez, female    DOB: 1971-06-19.  Kathryn Jimenez is being seen in follow up after being seen in consultation for management of currently uncontrolled symptomatic diabetes requested by  Chrystine Crate, FNP.   Past Medical History:  Diagnosis Date   Acute on chronic congestive heart failure (HCC) 03/16/2023   Allergy    Anginal pain (HCC)    Anxiety    Arthritis    Asthma    Bladder disorder, other    Borderline personality disorder (HCC)    CAD (coronary artery disease) 03/16/2023   Carotid artery disease (HCC) 03/05/2017   Depression    Diabetes mellitus without complication (HCC)    GERD (gastroesophageal reflux disease)    Headache    Hyperlipidemia    Hypertension    Kidney stones    Morbid obesity with BMI of 50.0-59.9, adult (HCC) 01/12/2020   Mucous retention cyst of maxillary sinus 08/31/2021   Noted on head CT   Neuromuscular disorder (HCC)    Neuropathy   PTSD (post-traumatic stress disorder)    Shortness of breath    Sleep apnea    Stroke (HCC) 2018   Thyroid  disease    nodule    Past Surgical History:  Procedure Laterality Date   CARPAL TUNNEL RELEASE     left 2015   CERVICAL FUSION Bilateral 2003   C 5-6 with titanium screws   CHOLECYSTECTOMY     COLONOSCOPY WITH PROPOFOL  N/A 06/24/2014   Procedure: COLONOSCOPY WITH PROPOFOL ;  Surgeon: Ruby Corporal, MD;  Location: [facility];  Service: Endoscopy;  Laterality: N/A;  cecum time in 0810    time out   0821    total time 11 minutes   DILITATION & CURRETTAGE/HYSTROSCOPY WITH HYDROTHERMAL ABLATION N/A 04/19/2021   Procedure: DILATATION & CURETTAGE/HYSTEROSCOPY WITH HYDROTHERMAL ABLATION;  Surgeon: Ozan, Jennifer, DO;  Location: [facility];  Service: Gynecology;  Laterality: N/A;   INTRAUTERINE DEVICE (IUD) INSERTION N/A 04/19/2021   Procedure:  INTRAUTERINE DEVICE (IUD) INSERTION;  Surgeon: Ozan, Jennifer, DO;  Location: [facility];  Service: Gynecology;  Laterality: N/A;   POLYPECTOMY N/A 06/24/2014   Procedure: RECTAL POLYPECTOMY;  Surgeon: Ruby Corporal, MD;  Location: [facility];  Service: Endoscopy;  Laterality: N/A;   RIGHT/LEFT HEART CATH AND CORONARY ANGIOGRAPHY N/A 06/19/2022   Procedure: RIGHT/LEFT HEART CATH AND CORONARY ANGIOGRAPHY;  Surgeon: Arnoldo Lapping, MD;  Location: [facility] IN[facility]ASIVE CV [facility];  Service: Cardiovascular;  Laterality: N/A;   SPINE SURGERY     URETHRAL DILATION  1981   WISDOM TOOTH EXTRACTION Bilateral 2001 and 1990s   top and bottom     Social History   Socioeconomic History   Marital status: Married    Spouse name: Samuel Crock   Number of children: 0   Years of education: HS   Highest education level: 12th grade  Occupational History   Occupation: Disabled  Tobacco Use   Smoking status: Never   Smokeless tobacco: Never  Vaping Use   Vaping status: Never Used  Substance and Sexual Activity   Alcohol use: No  Comment: Rarely   Drug use: No   Sexual activity: Not Currently    Birth control/protection: I.U.D.  Other Topics Concern   Not on file  Social History Narrative   Lives at home with husband.   Right-handed.   3-4 cups caffeine per day.   Social Drivers of Health   Financial Resource Strain: Patient Declined (10/15/2023)   Overall Financial Resource Strain (CARDIA)    Difficulty of Paying Living Expenses: Patient declined  Food Insecurity: Patient Declined (10/15/2023)   Hunger Vital Sign    Worried About Running Out of Food in the Last Year: Patient declined    Ran Out of Food in the Last Year: Patient declined  Transportation Needs: Unmet Transportation Needs (10/15/2023)   PRAPARE - Transportation    Lack of Transportation (Medical): Yes    Lack of Transportation (Non-Medical): Patient declined  Physical Activity: Insufficiently Active (10/15/2023)   Exercise Vital Sign    Days of  Exercise per Week: 1 day    Minutes of Exercise per Session: 10 min  Stress: Stress Concern Present (10/15/2023)   [employer] of Occupational Health - Occupational Stress Questionnaire    Feeling of Stress : To some extent  Social Connections: Moderately Integrated (10/15/2023)   Social Connection and Isolation Panel [NHANES]    Frequency of Communication with Friends and Family: More than three times a week    Frequency of Social Gatherings with Friends and Family: Once a week    Attends Religious Services: Never    Database administrator or Organizations: Yes    Attends Banker Meetings: Never    Marital Status: Married    Family History  Problem Relation Age of Onset   COPD Mother    Arthritis Mother    Asthma Mother    Depression Mother    Diabetes Mother    Heart disease Mother    Hyperlipidemia Mother    Hypertension Mother    Mental illness Mother    Skin cancer Mother    Lung cancer Father    Arthritis Father    Depression Father    Drug abuse Father    Early death Father    Kidney disease Father    Mental illness Father    Colon cancer Sister    Birth defects Sister    COPD Sister    Hyperlipidemia Sister    Learning disabilities Sister    Breast cancer Sister    Breast cancer Sister    Colon cancer Sister    Alcohol abuse Paternal Grandmother    Diabetes Paternal Grandmother    Alcohol abuse Paternal Grandfather     Outpatient Encounter Medications as of 12/30/2023  Medication Sig   acetaminophen  (TYLENOL ) 500 MG tablet Take 500 mg by mouth every 6 (six) hours as needed for mild pain or moderate pain.   albuterol  (VENTOLIN  HFA) 108 (90 Base) MCG/ACT inhaler inhale two puffs into the lungs every 6 (six) hours as needed for wheezing.   budesonide -formoterol  (SYMBICORT ) 80-4.5 MCG/ACT inhaler Take 2 puffs first thing in am and then another 2 puffs about 12 hours later.   buPROPion  (WELLBUTRIN  XL) 150 MG 24 hr tablet Take 150 mg by mouth  daily.   clopidogrel  (PLAVIX ) 75 MG tablet Take 1 tablet (75 mg total) by mouth daily.   clotrimazole -betamethasone  (LOTRISONE ) cream Apply thin layer to affected external area up to twice daily as needed   Continuous Glucose Sensor (DEXCOM G7 SENSOR) MISC Inject 1 Application into  the skin as directed. Change sensor every 10 days as directed.   cycloSPORINE  (RESTASIS ) 0.05 % ophthalmic emulsion Place 1 drop into both eyes 2 (two) times daily.   diphenhydrAMINE (BENADRYL) 25 mg capsule Take 25 mg by mouth every 6 (six) hours as needed for allergies.   famotidine  (PEPCID ) 20 MG tablet One after supper   fluconazole  (DIFLUCAN ) 150 MG tablet Take one pill every 3 days   FLUoxetine  (PROZAC ) 40 MG capsule Take 40 mg by mouth daily.   gabapentin  (NEURONTIN ) 100 MG capsule Take 1 capsule (100 mg total) by mouth 4 (four) times daily.   gabapentin  (NEURONTIN ) 300 MG capsule Take 1 capsule (300 mg total) by mouth 2 (two) times daily.   Insulin  Pen Needle (PEN NEEDLES) 31G X 6 MM MISC Use to inject insulin  4 times daily   levonorgestrel  (LILETTA , 52 MG,) 20.1 MCG/DAY IUD 1 each by Intrauterine route once. Inserted 04/19/21   loperamide  (IMODIUM ) 2 MG capsule Take 2 mg by mouth as needed for diarrhea or loose stools.   loratadine  (CLARITIN ) 10 MG tablet Take 1 tablet (10 mg total) by mouth daily.   LORazepam  (ATIVAN ) 0.5 MG tablet Take 0.25-0.5 mg by mouth daily as needed for anxiety.   metoprolol  succinate (TOPROL -XL) 50 MG 24 hr tablet Take 1 tablet (50 mg total) by mouth daily. Take with or immediately following a meal.   montelukast  (SINGULAIR ) 10 MG tablet Take 1 tablet (10 mg total) by mouth at bedtime.   NON FORMULARY Vit d3 with K2 taking once a day   nystatin  (MYCOSTATIN /NYSTOP ) powder Apply 1 application  topically daily as needed (for fungal infections).   OVER THE COUNTER MEDICATION Patient states that she is taking Vitamin K2+D 3 - patient states that she takes 2 a day.   oxybutynin   (DITROPAN -XL) 10 MG 24 hr tablet Take 1 tablet (10 mg total) by mouth at bedtime.   OXYGEN  Inhale 4 L/min into the lungs continuous.   pantoprazole  (PROTONIX ) 40 MG tablet take 1 tablet (40 milligram total) by mouth daily. take 30-60 min before first meal of the day   potassium chloride  (KLOR-CON ) 10 MEQ tablet Take 10 mEq by mouth 2 (two) times daily.   torsemide  (DEMADEX ) 20 MG tablet Take 1-2 tablets (20-40 mg total) by mouth daily. Can take extra tablet with weight gain greater than 3 lb in 24 hours   valACYclovir  (VALTREX ) 500 MG tablet Take 1 tablet (500 mg total) by mouth daily.   [DISCONTINUED] Accu-Chek Softclix Lancets lancets Use to check blood glucose four times daily as instructed   [DISCONTINUED] glucose blood (ACCU-CHEK GUIDE TEST) test strip Use to check blood glucose four times daily as instructed   [DISCONTINUED] insulin  aspart (NOVOLOG  FLEXPEN) 100 UNIT/ML FlexPen Inject 14-20 Units into the skin 3 (three) times daily with meals.   [DISCONTINUED] insulin  glargine (LANTUS  SOLOSTAR) 100 UNIT/ML Solostar Pen Inject 35 Units into the skin at bedtime.   Accu-Chek Softclix Lancets lancets Use to check blood glucose four times daily as instructed   glucose blood (ACCU-CHEK GUIDE TEST) test strip Use to check blood glucose four times daily as instructed   insulin  aspart (NOVOLOG  FLEXPEN) 100 UNIT/ML FlexPen Inject 14-20 Units into the skin 3 (three) times daily with meals.   insulin  glargine (LANTUS  SOLOSTAR) 100 UNIT/ML Solostar Pen Inject 35 Units into the skin at bedtime.   No facility-administered encounter medications on file as of 12/30/2023.    ALLERGIES: Allergies  Allergen Reactions   Actos [Pioglitazone] Swelling  Augmentin [Amoxicillin-Pot Clavulanate] Diarrhea, Nausea Only and Other (See Comments)    Caused severe diarrhea   Citrus Other (See Comments)    "Issues with my tongue"- the fleshy part of the fruit    Invokana [Canagliflozin] Other (See Comments)    Yeast  infection   Lopid  [Gemfibrozil ] Other (See Comments)    Made the tongue burn   Metoprolol  Other (See Comments)    Headaches    Naproxen Sodium Other (See Comments)    Patient states she "felt loopy"   Penicillins Diarrhea and Other (See Comments)    Violent diarrhea- must have Diflucan    Pineapple Other (See Comments)    "Issues with my tongue"   Statins Other (See Comments)    Joint pain   Tape Itching and Other (See Comments)    Tape, EKG leads, and Band-Aids - Itching and redness   Zetia  [place] ] Other (See Comments)    Headaches    Amlodipine  Cough   Clavulanic Acid Nausea Only   Hctz [Hydrochlorothiazide] Rash   Lisinopril  Cough   Morphine And Codeine Itching and Rash    VACCINATION STATUS: Immunization History  Administered Date(s) Administered   Influenza,inj,Quad PF,6+ Mos 07/01/2017, 05/29/2018   Influenza-Unspecified 05/25/2014, 07/03/2016   Tdap 01/26/2010, 01/21/2020    Diabetes She presents for her follow-up diabetic visit. She has type 2 diabetes mellitus. Onset time: Diagnosed at approx age of 59. Her disease course has been fluctuating. There are no hypoglycemic associated symptoms. Associated symptoms include fatigue, foot paresthesias, polydipsia and polyuria. Pertinent negatives for diabetes include no weight loss. There are no hypoglycemic complications. Symptoms are improving. Diabetic complications include a CVA, heart disease (CHF) and peripheral neuropathy. Risk factors for coronary artery disease include obesity, hypertension, sedentary lifestyle, family history, dyslipidemia and diabetes mellitus. Current diabetic treatment includes intensive insulin  program. She is compliant with treatment some of the time. Her weight is increasing steadily. She is following a generally unhealthy diet. Meal planning includes avoidance of concentrated sweets. She has not had a previous visit with a dietitian. She participates in exercise intermittently. Her overall blood  glucose range is >200 mg/dl. (She presents today, after long absence, with her logs showing inconsistent glucose monitoring and fluctuating readings.  Her most recent A1c on 2/25 was 11.1%.  She notes she has had a crazy year with health issues with both her and her husband.  She admits she has not been taking the insulin  as directed as she has not been eating on a routine pattern.  ) An ACE inhibitor/angiotensin II receptor blocker is being taken. She does not see a podiatrist.Eye exam is current.     Review of systems  Constitutional: + steadily increasing body weight, current Body mass index is 49.69 kg/m., no fatigue, no subjective hyperthermia, no subjective hypothermia Eyes: no blurry vision, no xerophthalmia ENT: no sore throat, no nodules palpated in throat, no dysphagia/odynophagia, no hoarseness Cardiovascular: no chest pain, no shortness of breath, no palpitations, no leg swelling Respiratory: no cough, + shortness of breath, on chronic O2 via [place] Gastrointestinal: no nausea/vomiting/diarrhea Musculoskeletal: no muscle/joint aches Skin: no rashes, no hyperemia Neurological: no tremors, + numbness/tingling to bilateral hands and feet due to neuropathy, no dizziness Psychiatric: no depression, no anxiety  Objective:     BP 120/70 (BP Location: Left Arm, Patient Position: Sitting, Cuff Size: Large)   Pulse 82   Ht 5' 4.5" (1.638 m)   Wt 294 lb (133.4 kg)   BMI 49.69 kg/m   Wt Readings  from Last 3 Encounters:  12/30/23 294 lb (133.4 kg)  12/16/23 286 lb (129.7 kg)  11/01/23 280 lb (127 kg)     BP Readings from Last 3 Encounters:  12/30/23 120/70  12/16/23 (!) 148/68  11/01/23 (!) 155/81      Physical Exam- Limited  Constitutional:  Body mass index is 49.69 kg/m. , not in acute distress, normal state of mind Eyes:  EOMI, no exophthalmos Musculoskeletal: no gross deformities, strength intact in all four extremities, no gross restriction of joint movements Skin:  no  rashes, no hyperemia Neurological: no tremor with outstretched hands    CMP ( most recent) CMP     Component Value Date/Time   NA 137 10/15/2023 1022   K 4.3 10/15/2023 1022   CL 95 (L) 10/15/2023 1022   CO2 23 10/15/2023 1022   GLUCOSE 298 (H) 10/15/2023 1022   GLUCOSE 265 (H) 03/19/2023 0105   BUN 8 10/15/2023 1022   CREATININE 0.88 10/15/2023 1022   CALCIUM  9.8 10/15/2023 1022   PROT 7.2 10/15/2023 1022   ALBUMIN 4.0 10/15/2023 1022   AST 10 10/15/2023 1022   ALT 9 10/15/2023 1022   ALKPHOS 130 (H) 10/15/2023 1022   BILITOT 0.4 10/15/2023 1022   GFRNONAA 38 (L) 03/19/2023 0105   GFRAA 125 07/01/2017 1019     Diabetic Labs (most recent): Lab Results  Component Value Date   HGBA1C 11.1 (H) 10/15/2023   HGBA1C 8.7 (A) 10/19/2022   HGBA1C 6.9 (H) 06/18/2022     Lipid Panel ( most recent) Lipid Panel     Component Value Date/Time   CHOL 181 10/15/2023 1022   TRIG 122 10/15/2023 1022   HDL 52 10/15/2023 1022   CHOLHDL 3.5 10/15/2023 1022   CHOLHDL 4.9 09/13/2022 1045   VLDL 25 09/13/2022 1045   LDLCALC 107 (H) 10/15/2023 1022   LABVLDL 22 10/15/2023 1022      Lab Results  Component Value Date   TSH 1.830 09/20/2023   TSH 2.300 08/08/2023   TSH 1.910 05/16/2023   TSH 2.320 12/18/2016           Assessment & Plan:   1) Type 2 diabetes without complication with long-term current use of insulin   She presents today, after long absence, with her logs showing inconsistent glucose monitoring and fluctuating readings.  Her most recent A1c on 2/25 was 11.1%.  She notes she has had a crazy year with health issues with both her and her husband.  She admits she has not been taking the insulin  as directed as she has not been eating on a routine pattern.   - Kathryn Jimenez has currently uncontrolled symptomatic type 2 DM since 53 years of age.   -Recent labs reviewed.  - I had a long discussion with her about the progressive nature of diabetes and the pathology  behind its complications. -her diabetes is complicated by CHF, neuropathy, CVA and she remains at a high risk for more acute and chronic complications which include CAD, CVA, CKD, retinopathy, and neuropathy. These are all discussed in detail with her.  The following Lifestyle Medicine recommendations according to American College of Lifestyle Medicine [facility]) were discussed and offered to patient and she agrees to start the journey:  A. Whole Foods, Plant-based plate comprising of fruits and vegetables, plant-based proteins, whole-grain carbohydrates was discussed in detail with the patient.   A list for source of those nutrients were also provided to the patient.  Patient will use only water  or  unsweetened tea for hydration. B.  The need to stay away from risky substances including alcohol, smoking; obtaining 7 to 9 hours of restorative sleep, at least 150 minutes of moderate intensity exercise weekly, the importance of healthy social connections,  and stress reduction techniques were discussed. C.  A full color page of  Calorie density of various food groups per pound showing examples of each food groups was provided to the patient.  - Nutritional counseling repeated at each appointment due to patients tendency to fall back in to old habits.  - The patient admits there is a room for improvement in their diet and drink choices. -  Suggestion is made for the patient to avoid simple carbohydrates from their diet including Cakes, Sweet Desserts / Pastries, Ice Cream, Soda (diet and regular), Sweet Tea, Candies, Chips, Cookies, Sweet Pastries, Store Bought Juices, Alcohol in Excess of 1-2 drinks a day, Artificial Sweeteners, Coffee Creamer, and "Sugar-free" Products. This will help patient to have stable blood glucose profile and potentially avoid unintended weight gain.   - I encouraged the patient to switch to unprocessed or minimally processed complex starch and increased protein intake (animal or plant  source), fruits, and vegetables.   - Patient is advised to stick to a routine mealtimes to eat 3 meals a day and avoid unnecessary snacks (to snack only to correct hypoglycemia).  - I have approached her with the following individualized plan to manage her diabetes and patient agrees:   -She is advised to continue her Lantus  35 units SQ nightly and Novolog  to 14-20 units TID with meals if glucose is above 90 and she is eating (Specific instructions on how to titrate insulin  dosage based on glucose readings given to patient in writing).   -she is encouraged to start monitoring blood glucose 4 times daily, before meals and before bed, and to call the clinic if she has readings less than 70 or above 300 for 3 tests in a row.  I did send in for Dexcom to [facility] for her.  Priority is getting CGM.  I asked her to update me with readings in 1 month so we can adjust medications properly.  - she is warned not to take insulin  without proper monitoring per orders. - Adjustment parameters are given to her for hypo and hyperglycemia in writing.  - her JARDIANCE was previously discontinued, risk outweighs benefit for this patient.  Given her account of frequent UTIs and vaginal yeast infections, and skin irritation in her groin, this class of medications is not the best for her.  - she will be considered for incretin therapy as appropriate next visit.  I did encourage her to research GLP1 products.  She does NOT want to use these products due to her existing stomach issues.  - Specific targets for  A1c; LDL, HDL, and Triglycerides were discussed with the patient.  2) Blood Pressure /Hypertension:  her blood pressure is controlled to target.   she is advised to continue her current medications as prescribed by her PCP.  3) Lipids/Hyperlipidemia:    Review of her recent lipid panel from 10/15/23 showed uncontrolled LDL at 107.  She has statin intolerance but is advised to continue Zetia  10 mg daily.  4)   Weight/Diet:  her Body mass index is 49.69 kg/m.  -  clearly complicating her diabetes care.   she is a candidate for weight loss. I discussed with her the fact that loss of 5 - 10% of her  current body  weight will have the most impact on her diabetes management.  Exercise, and detailed carbohydrates information provided  -  detailed on discharge instructions.  5) Chronic Care/Health Maintenance: -she is on ACEI/ARB and has intolerance to Statin medications and is encouraged to initiate and continue to follow up with Ophthalmology, Dentist, Podiatrist at least yearly or according to recommendations, and advised to stay away from smoking. I have recommended yearly flu vaccine and pneumonia vaccine at least every 5 years; moderate intensity exercise for up to 150 minutes weekly; and sleep for at least 7 hours a day.  - she is advised to maintain close follow up with Kathryn Jimenez, Kathryn Hastings, FNP for primary care needs, as well as her other providers for optimal and coordinated care.     I spent  55  minutes in the care of the patient today including review of labs from CMP, Lipids, Thyroid  Function, Hematology (current and previous including abstractions from other facilities); face-to-face time discussing  her blood glucose readings/logs, discussing hypoglycemia and hyperglycemia episodes and symptoms, medications doses, her options of short and long term treatment based on the latest standards of care / guidelines;  discussion about incorporating lifestyle medicine;  and documenting the encounter. Risk reduction counseling performed per USPSTF guidelines to reduce obesity and cardiovascular risk factors.     Please refer to Patient Instructions for Blood Glucose Monitoring and Insulin /Medications Dosing Guide"  in media tab for additional information. Please  also refer to " Patient Self Inventory" in the Media  tab for reviewed elements of pertinent patient history.  Kathryn Jimenez participated in the  discussions, expressed understanding, and voiced agreement with the above plans.  All questions were answered to her satisfaction. she is encouraged to contact clinic should she have any questions or concerns prior to her return visit.     Follow up plan: - Return in about 3 months (around 03/31/2024) for Diabetes F/U with A1c in office, No previsit labs, Bring meter and logs.  Hulon Magic, [facility] [address] [place], [place] [address] Phone: [phone] Fax: [phone]  12/30/2023, 1:37 PM

## 2023-12-31 ENCOUNTER — Encounter: Payer: Self-pay | Admitting: Nurse Practitioner

## 2023-12-31 ENCOUNTER — Telehealth: Payer: Self-pay

## 2023-12-31 NOTE — Telephone Encounter (Signed)
 Pharmacy Patient Advocate Encounter   Received notification from CoverMyMeds that prior authorization for Dexcom G7 sensor is required/requested.   Insurance verification completed.   The patient is insured through [employer] .   Per test claim: PA required; PA submitted to above mentioned insurance via CoverMyMeds Key/confirmation #/EOC [facility] Status is pending

## 2024-01-01 ENCOUNTER — Ambulatory Visit: Payer: Self-pay | Admitting: Internal Medicine

## 2024-01-01 NOTE — Telephone Encounter (Signed)
 Copied from CRM [id number]. Topic: Clinical - Red Word Triage >> Jan 01, 2024  2:07 PM Tyronne Galloway wrote: Red Word that prompted transfer to Nurse Triage: Pt stated she's felt weak and extremely fatigued with a headache for about 4 days. This has also brought on coughing spells. Pt stated she recently was told to start gabapentin  (NEURONTIN ) 300 MG capsule twice a day since Betz[place]th. Pt also had questions regarding which drinks she could drink, and patient stated she stopped drinking soda, but wanted clarification on what drinks she can or can't drink.  TRIAGE SUMMARY NOTE: Pt is poor historian. Pt reporting that she has had worse SOB with severe coughing fits for the past 4 days or so, reporting her oxygen  levels are sometimes dropping down to 79% at rest, needing to use her oxygen  more, oxygen  gets her pulse ox to go back up to above 90 such as 92% then goes back down. Pt is supposed to be on 4L oxygen  for sleep but having to use while sitting during wake hours. Pt confirms she is also experiencing weakness and exhaustion, to the point where with coughing fits she feels her legs may give out, also coughing fits get her gagging to where almost throw up, also reporting chest tightness at times of unknown duration and thinks may be from anxiety or exhaustion. Pt confirms no wheezing or fever, not coughing anything up though chest congestion, confirms she has not tried her rescue inhaler since not wheezing. Advised pt use her rescue inhaler now, pt used inhaler over phone. Advised pt get ride to ED for symptoms, call 911 if worsening. Pt refusing ED at this time, requesting Dr. Waymond Hailey confirm the need to go to ED, "thought would just need to change gabapentin " dose. Advised strongly that symptoms warrant ED, advised that sending HP message to pulm office for call back with further recommendations, advised go to ED if not heard back by end of day. Please advise.  E2C2 Pulmonary Triage - Initial Assessment  Questions "Chief Complaint (e.g., cough, sob, wheezing, fever, chills, sweat or additional symptoms) *Go to specific symptom protocol after initial questions. Coughing is the major part Confirms more SOB than normal O2 sats not staying up, sometimes dropping down to 79% at rest then put oxygen  on and get it back up to above 90 92% then going back down Coughing is preventing increase in oxygen  levels Weakness and fatigue Having pt do her rescue inhaler, did one puff No chest pain, tightness but not pain, kinda feels like anxiety or stress related, comes and goes, exhausted because all the coughing, anxiety from all this, not that I know for lasting longer than 5 min at a time After I've coughed so much, kinda like get breath knocked out of you type feeling, feel like legs don't want to hold you up, get weak from the coughing, don't feel like going to pass out, gagging to point where going to throw up No fever, wheezing Wanna cough something up but nothing coming up, chest congestion No other symptoms Feels like relapsing of symptoms when first saw pulm Would like to hear back from them and if they believe that I still need to go to ED then will find way to get there   "How long have symptoms been present?" More than 4 days  MEDICINES:   "Have you used any OTC meds to help with symptoms?" Yes If yes, ask "What medications?" claritin   "Have you used your inhalers/maintenance medication?" Yes If  yes, "What medications?" Symbicort  2x/day, rescue inhaler haven't used it because not wheezing just coughing, singulair  at night  OXYGEN : "Do you wear supplemental oxygen ?" Yes If yes, "How many liters are you supposed to use?" Most I can do is 4L, when sitting not supposed to have it on, supposed to wear while sleeping  Reason for Disposition  Difficulty breathing  Answer Assessment - Initial Assessment Questions 4. CAUSE: "What do you think is causing the weakness or fatigue?" (e.g., not  drinking enough fluids, medical problem, trouble sleeping)     Confirms drinking and eating well Thinks may be her gabapentin   Protocols used: Weakness (Generalized) and Fatigue-A-AH

## 2024-01-02 ENCOUNTER — Encounter: Payer: Self-pay | Admitting: Internal Medicine

## 2024-01-02 ENCOUNTER — Encounter: Payer: Self-pay | Admitting: Family Medicine

## 2024-01-02 NOTE — Telephone Encounter (Signed)
 Pharmacy Patient Advocate Encounter  Received notification from [facility] ([facility]) that Prior Authorization for Dexcom G7 Sensor has been APPROVED from 01-01-2024 to 07-03-2024   PA #/Case ID/Reference #: Rachell [id number]

## 2024-01-02 NOTE — Telephone Encounter (Signed)
 Per MyChart message sent by patient this morning she is going to the ED for evaluation. Nothing further needed at this time.

## 2024-01-02 NOTE — Telephone Encounter (Signed)
 Per patient's mychart:  Oxygen  numbers not good. Gained 8 pounds in 24 hours. Fluid built up I believe. Going  to ER Thank you  Mariellen Gowan

## 2024-01-05 NOTE — Telephone Encounter (Signed)
 Pt seen on a few days ago in [place] ER     Cough     Labs and XRay showed very mild volume increase    She was treated with lasix      Recomm 1   Make appt for her in [facility] in the next several weeks for follow up 2  Watch/limit salt 3  Weigh daily   If weight goes up by 4 lbs in 2 to 3 days that is fluid    Call

## 2024-01-07 ENCOUNTER — Encounter: Payer: Self-pay | Admitting: Internal Medicine

## 2024-01-09 ENCOUNTER — Other Ambulatory Visit: Payer: Self-pay | Admitting: Internal Medicine

## 2024-01-09 ENCOUNTER — Other Ambulatory Visit: Payer: Self-pay | Admitting: Family Medicine

## 2024-01-09 DIAGNOSIS — J4520 Mild intermittent asthma, uncomplicated: Secondary | ICD-10-CM

## 2024-01-09 DIAGNOSIS — J45991 Cough variant asthma: Secondary | ICD-10-CM

## 2024-01-09 DIAGNOSIS — J9611 Chronic respiratory failure with hypoxia: Secondary | ICD-10-CM

## 2024-01-09 DIAGNOSIS — N39498 Other specified urinary incontinence: Secondary | ICD-10-CM

## 2024-01-14 ENCOUNTER — Ambulatory Visit (INDEPENDENT_AMBULATORY_CARE_PROVIDER_SITE_OTHER): Payer: MEDICAID | Admitting: Family Medicine

## 2024-01-14 ENCOUNTER — Encounter: Payer: Self-pay | Admitting: Family Medicine

## 2024-01-14 VITALS — BP 137/64 | HR 69 | Temp 97.0°F | Ht 64.5 in | Wt 290.0 lb

## 2024-01-14 DIAGNOSIS — E041 Nontoxic single thyroid nodule: Secondary | ICD-10-CM

## 2024-01-14 DIAGNOSIS — R918 Other nonspecific abnormal finding of lung field: Secondary | ICD-10-CM

## 2024-01-14 DIAGNOSIS — Z794 Long term (current) use of insulin: Secondary | ICD-10-CM

## 2024-01-14 DIAGNOSIS — E559 Vitamin D deficiency, unspecified: Secondary | ICD-10-CM

## 2024-01-14 DIAGNOSIS — I700 Atherosclerosis of aorta: Secondary | ICD-10-CM

## 2024-01-14 DIAGNOSIS — J9611 Chronic respiratory failure with hypoxia: Secondary | ICD-10-CM | POA: Diagnosis not present

## 2024-01-14 DIAGNOSIS — J84112 Idiopathic pulmonary fibrosis: Secondary | ICD-10-CM

## 2024-01-14 DIAGNOSIS — J4520 Mild intermittent asthma, uncomplicated: Secondary | ICD-10-CM | POA: Diagnosis not present

## 2024-01-14 DIAGNOSIS — F331 Major depressive disorder, recurrent, moderate: Secondary | ICD-10-CM

## 2024-01-14 DIAGNOSIS — E1142 Type 2 diabetes mellitus with diabetic polyneuropathy: Secondary | ICD-10-CM

## 2024-01-14 DIAGNOSIS — E1159 Type 2 diabetes mellitus with other circulatory complications: Secondary | ICD-10-CM

## 2024-01-14 DIAGNOSIS — I5032 Chronic diastolic (congestive) heart failure: Secondary | ICD-10-CM

## 2024-01-14 DIAGNOSIS — I152 Hypertension secondary to endocrine disorders: Secondary | ICD-10-CM

## 2024-01-14 LAB — BAYER DCA HB A1C WAIVED: HB A1C (BAYER DCA - WAIVED): 8.3 % — ABNORMAL HIGH (ref 4.8–5.6)

## 2024-01-14 NOTE — Progress Notes (Signed)
 Subjective:  Patient ID: Kathryn Jimenez, female    DOB: 12/06/1970, 53 y.o.   MRN: [id number]  Patient Care Team: Chrystine Crate, FNP as PCP - General (Family Medicine) Elmyra Haggard, MD as PCP - Cardiology (Cardiology) Tasia Farr, MD as Consulting Physician (Endocrinology) Lorry Rouge, MD as Consulting Physician (Dermatology) Diamond Formica, MD as Consulting Physician (Pulmonary Disease)   Chief Complaint:  ER follow up  (01/02/2024/[facility] )  HPI: Kathryn Jimenez is a 53 y.o. female presenting on 01/14/2024 for ER follow up  (01/02/2024/[facility] )  HPI Chronic hypoxic respiratory failure (HCC) States that she went into hospital as she had 8 lbs of weight gain and shortness of breath. She was given lasix , tessalon  Perles, and cough syrup. States that she has been doing well since coming home.  States that since coming home she is using 3 and 4 liters of oxygen  with activity. Siting she is using 0.5 to 0 liters. Using albuterol  once daily  She has follow up with cardiology July 15th.   She is taking torsemide  at home, she is still up from dry weight  She is monitoring her BG with CGM. States that BG is better since hospitalization. She is established with endocrinology. She has only been wearing it 6 days. Reports that she is in range 40% of the time. High 38% and very high 22%  She followed up with eye exam and received new glasses. Completed with Dr. Buck Carbon on March 27th.  She has follow up with Cardiology planned.   Mild intermittent asthma without complication She is established with pulmonology and working with him to improve her coughing symptoms. She is taking Singulair , protonix , pepcid , symbicort , and albuterol .  She is frustrated with pulmonology as she does not feel that her symptoms are being evaluated appropriately. She is supposed to have lab work with them. She is planning to follow up with allergist.   Depression   Established with Dr. Janelle Mediate. States that her symptoms are worsening and has talked to him about it. Denies SI. Follow up with Dr. Janelle Mediate planned in 3 months. Did not make changes to current medications. Is not currently doing counseling. Counseling is available at [facility].   Primary HTN She is not currently taking losartan . BP is well controlled. Taking metoprolol , well controlled.   Relevant past medical, surgical, family, and social history reviewed and updated as indicated.  Allergies and medications reviewed and updated. Data reviewed: Chart in Epic.   Past Medical History:  Diagnosis Date   Acute on chronic congestive heart failure (HCC) 03/16/2023   Allergy    Anginal pain (HCC)    Anxiety    Arthritis    Asthma    Bladder disorder, other    Borderline personality disorder (HCC)    CAD (coronary artery disease) 03/16/2023   Carotid artery disease (HCC) 03/05/2017   Depression    Diabetes mellitus without complication (HCC)    GERD (gastroesophageal reflux disease)    Headache    Hyperlipidemia    Hypertension    Kidney stones    Morbid obesity with BMI of 50.0-59.9, adult (HCC) 01/12/2020   Mucous retention cyst of maxillary sinus 08/31/2021   Noted on head CT   Neuromuscular disorder (HCC)    Neuropathy   PTSD (post-traumatic stress disorder)    Shortness of breath    Sleep apnea    Stroke (HCC) 2018   Thyroid  disease    nodule  Past Surgical History:  Procedure Laterality Date   CARPAL TUNNEL RELEASE     left 2015   CERVICAL FUSION Bilateral 2003   C 5-6 with titanium screws   CHOLECYSTECTOMY     COLONOSCOPY WITH PROPOFOL  N/A 06/24/2014   Procedure: COLONOSCOPY WITH PROPOFOL ;  Surgeon: Ruby Corporal, MD;  Location: [facility];  Service: Endoscopy;  Laterality: N/A;  cecum time in 0810    time out   0821    total time 11 minutes   DILITATION & CURRETTAGE/HYSTROSCOPY WITH HYDROTHERMAL ABLATION N/A 04/19/2021   Procedure: DILATATION & CURETTAGE/HYSTEROSCOPY WITH  HYDROTHERMAL ABLATION;  Surgeon: Ozan, Jennifer, DO;  Location: [facility];  Service: Gynecology;  Laterality: N/A;   INTRAUTERINE DEVICE (IUD) INSERTION N/A 04/19/2021   Procedure: INTRAUTERINE DEVICE (IUD) INSERTION;  Surgeon: Ozan, Jennifer, DO;  Location: [facility];  Service: Gynecology;  Laterality: N/A;   POLYPECTOMY N/A 06/24/2014   Procedure: RECTAL POLYPECTOMY;  Surgeon: Ruby Corporal, MD;  Location: [facility];  Service: Endoscopy;  Laterality: N/A;   RIGHT/LEFT HEART CATH AND CORONARY ANGIOGRAPHY N/A 06/19/2022   Procedure: RIGHT/LEFT HEART CATH AND CORONARY ANGIOGRAPHY;  Surgeon: Arnoldo Lapping, MD;  Location: [facility] INVASIVE CV LAB;  Service: Cardiovascular;  Laterality: N/A;   SPINE SURGERY     URETHRAL DILATION  1981   WISDOM TOOTH EXTRACTION Bilateral 2001 and 1990s   top and bottom     Social History   Socioeconomic History   Marital status: Married    Spouse name: Samuel Crock   Number of children: 0   Years of education: HS   Highest education level: 12th grade  Occupational History   Occupation: Disabled  Tobacco Use   Smoking status: Never   Smokeless tobacco: Never  Vaping Use   Vaping status: Never Used  Substance and Sexual Activity   Alcohol use: No    Comment: Rarely   Drug use: No   Sexual activity: Not Currently    Birth control/protection: I.U.D.  Other Topics Concern   Not on file  Social History Narrative   Lives at home with husband.   Right-handed.   3-4 cups caffeine per day.   Social Drivers of Health   Financial Resource Strain: Patient Declined (10/15/2023)   Overall Financial Resource Strain (CARDIA)    Difficulty of Paying Living Expenses: Patient declined  Food Insecurity: Patient Declined (10/15/2023)   Hunger Vital Sign    Worried About Running Out of Food in the Last Year: Patient declined    Ran Out of Food in the Last Year: Patient declined  Transportation Needs: Unmet Transportation Needs (10/15/2023)   PRAPARE - Transportation    Lack of  Transportation (Medical): Yes    Lack of Transportation (Non-Medical): Patient declined  Physical Activity: Insufficiently Active (10/15/2023)   Exercise Vital Sign    Days of Exercise per Week: 1 day    Minutes of Exercise per Session: 10 min  Stress: Stress Concern Present (10/15/2023)   [employer] of Occupational Health - Occupational Stress Questionnaire    Feeling of Stress : To some extent  Social Connections: Moderately Integrated (10/15/2023)   Social Connection and Isolation Panel [NHANES]    Frequency of Communication with Friends and Family: More than three times a week    Frequency of Social Gatherings with Friends and Family: Once a week    Attends Religious Services: Never    Database administrator or Organizations: Yes    Attends Banker Meetings: Never  Marital Status: Married  Catering manager Violence: Not At Risk (03/16/2023)   Humiliation, Afraid, Rape, and Kick questionnaire    Fear of Current or Ex-Partner: No    Emotionally Abused: No    Physically Abused: No    Sexually Abused: No    Outpatient Encounter Medications as of 01/14/2024  Medication Sig   Accu-Chek Softclix Lancets lancets Use to check blood glucose four times daily as instructed   acetaminophen  (TYLENOL ) 500 MG tablet Take 500 mg by mouth every 6 (six) hours as needed for mild pain or moderate pain.   albuterol  (VENTOLIN  HFA) 108 (90 Base) MCG/ACT inhaler inhale two puffs into the lungs every 6 (six) hours as needed for wheezing.   budesonide -formoterol  (SYMBICORT ) 80-4.5 MCG/ACT inhaler Take 2 puffs first thing in am and then another 2 puffs about 12 hours later.   buPROPion  (WELLBUTRIN  XL) 150 MG 24 hr tablet Take 150 mg by mouth daily.   clopidogrel  (PLAVIX ) 75 MG tablet Take 1 tablet (75 mg total) by mouth daily.   clotrimazole -betamethasone  (LOTRISONE ) cream Apply thin layer to affected external area up to twice daily as needed   Continuous Glucose Sensor (DEXCOM G7 SENSOR)  MISC Inject 1 Application into the skin as directed. Change sensor every 10 days as directed.   cycloSPORINE  (RESTASIS ) 0.05 % ophthalmic emulsion Place 1 drop into both eyes 2 (two) times daily.   diphenhydrAMINE (BENADRYL) 25 mg capsule Take 25 mg by mouth every 6 (six) hours as needed for allergies.   famotidine  (PEPCID ) 20 MG tablet One after supper   fluconazole  (DIFLUCAN ) 150 MG tablet Take one pill every 3 days   FLUoxetine  (PROZAC ) 40 MG capsule Take 40 mg by mouth daily.   gabapentin  (NEURONTIN ) 100 MG capsule Take 1 capsule (100 mg total) by mouth 4 (four) times daily.   gabapentin  (NEURONTIN ) 300 MG capsule Take 1 capsule (300 mg total) by mouth 2 (two) times daily.   glucose blood (ACCU-CHEK GUIDE TEST) test strip Use to check blood glucose four times daily as instructed   insulin  aspart (NOVOLOG  FLEXPEN) 100 UNIT/ML FlexPen Inject 14-20 Units into the skin 3 (three) times daily with meals.   insulin  glargine (LANTUS  SOLOSTAR) 100 UNIT/ML Solostar Pen Inject 35 Units into the skin at bedtime.   Insulin  Pen Needle (PEN NEEDLES) 31G X 6 MM MISC Use to inject insulin  4 times daily   levonorgestrel  (LILETTA , 52 MG,) 20.1 MCG/DAY IUD 1 each by Intrauterine route once. Inserted 04/19/21   loperamide  (IMODIUM ) 2 MG capsule Take 2 mg by mouth as needed for diarrhea or loose stools.   loratadine  (CLARITIN ) 10 MG tablet Take 1 tablet (10 mg total) by mouth daily.   LORazepam  (ATIVAN ) 0.5 MG tablet Take 0.25-0.5 mg by mouth daily as needed for anxiety.   metoprolol  succinate (TOPROL -XL) 50 MG 24 hr tablet Take 1 tablet (50 mg total) by mouth daily. Take with or immediately following a meal.   montelukast  (SINGULAIR ) 10 MG tablet take 1 tablet (10 MILLIGRAM total) by mouth at bedtime.   NON FORMULARY Vit d3 with K2 taking once a day   nystatin  (MYCOSTATIN /NYSTOP ) powder Apply 1 application  topically daily as needed (for fungal infections).   OVER THE COUNTER MEDICATION Patient states that she is  taking Vitamin K2+D 3 - patient states that she takes 2 a day.   oxybutynin  (DITROPAN -XL) 10 MG 24 hr tablet take 1 tablet (10 MILLIGRAM total) by mouth at bedtime.   OXYGEN  Inhale 4 L/min into  the lungs continuous.   pantoprazole  (PROTONIX ) 40 MG tablet take 1 tablet (40 milligram total) by mouth daily. take 30-60 min before first meal of the day   potassium chloride  (KLOR-CON ) 10 MEQ tablet Take 10 mEq by mouth 2 (two) times daily.   valACYclovir  (VALTREX ) 500 MG tablet Take 1 tablet (500 mg total) by mouth daily.   torsemide  (DEMADEX ) 20 MG tablet Take 1-2 tablets (20-40 mg total) by mouth daily. Can take extra tablet with weight gain greater than 3 lb in 24 hours   [DISCONTINUED] Boric Acid Vaginal 600 MG SUPP Place 600 mg vaginally at bedtime.   [DISCONTINUED] insulin  aspart (NOVOLOG  FLEXPEN) 100 UNIT/ML FlexPen Inject 14-20 Units into the skin 3 (three) times daily with meals.   [DISCONTINUED] insulin  glargine (LANTUS  SOLOSTAR) 100 UNIT/ML Solostar Pen Inject 35 Units into the skin at bedtime.   [DISCONTINUED] montelukast  (SINGULAIR ) 10 MG tablet Take 1 tablet (10 mg total) by mouth at bedtime.   [DISCONTINUED] oxybutynin  (DITROPAN -XL) 10 MG 24 hr tablet Take 1 tablet (10 mg total) by mouth at bedtime. **NEEDS TO BE SEEN BEFORE NEXT REFILL**   [DISCONTINUED] pantoprazole  (PROTONIX ) 40 MG tablet Take 1 tablet (40 mg total) by mouth daily. Take 30-60 min before first meal of the day   [DISCONTINUED] potassium chloride  (KLOR-CON  M) 10 MEQ tablet Take 10 mEq by mouth 2 (two) times daily.   No facility-administered encounter medications on file as of 01/14/2024.    Allergies  Allergen Reactions   Actos [Pioglitazone] Swelling   Augmentin [Amoxicillin-Pot Clavulanate] Diarrhea, Nausea Only and Other (See Comments)    Caused severe diarrhea   Citrus Other (See Comments)    "Issues with my tongue"- the fleshy part of the fruit    Invokana [Canagliflozin] Other (See Comments)    Yeast  infection   Lopid  [Gemfibrozil ] Other (See Comments)    Made the tongue burn   Metoprolol  Other (See Comments)    Headaches    Naproxen Sodium Other (See Comments)    Patient states she "felt loopy"   Penicillins Diarrhea and Other (See Comments)    Violent diarrhea- must have Diflucan    Pineapple Other (See Comments)    "Issues with my tongue"   Statins Other (See Comments)    Joint pain   Tape Itching and Other (See Comments)    Tape, EKG leads, and Band-Aids - Itching and redness   Zetia  [Ezetimibe ] Other (See Comments)    Headaches    Amlodipine  Cough   Clavulanic Acid Nausea Only   Hctz [Hydrochlorothiazide] Rash   Lisinopril  Cough   Morphine And Codeine Itching and Rash   Review of Systems As per HPI  Objective:  BP 137/64   Pulse 69   Temp (!) 97 F (36.1 C)   Ht 5' 4.5" (1.638 m)   Wt 290 lb (131.5 kg)   SpO2 91%   BMI 49.01 kg/m    Wt Readings from Last 3 Encounters:  01/14/24 290 lb (131.5 kg)  12/30/23 294 lb (133.4 kg)  12/16/23 286 lb (129.7 kg)   Physical Exam  Results for orders placed or performed in visit on 12/16/23  CBC with Differential/Platelet   Collection Time: 12/16/23 10:30 AM  Result Value Ref Range   WBC 8.8 3.4 - 10.8 x10E3/uL   RBC 4.79 3.77 - 5.28 x10E6/uL   Hemoglobin 13.2 11.1 - 15.9 g/dL   Hematocrit 52.8 41.3 - 46.6 %   MCV 86 79 - 97 fL   MCH 27.6  26.6 - 33.0 pg   MCHC 31.9 31.5 - 35.7 g/dL   RDW 16.1 09.6 - 04.5 %   Platelets 426 150 - 450 x10E3/uL   Neutrophils 60 Not Estab. %   Lymphs 30 Not Estab. %   Monocytes 6 Not Estab. %   Eos 2 Not Estab. %   Basos 1 Not Estab. %   Neutrophils Absolute 5.3 1.4 - 7.0 x10E3/uL   Lymphocytes Absolute 2.6 0.7 - 3.1 x10E3/uL   Monocytes Absolute 0.6 0.1 - 0.9 x10E3/uL   EOS (ABSOLUTE) 0.2 0.0 - 0.4 x10E3/uL   Basophils Absolute 0.1 0.0 - 0.2 x10E3/uL   Immature Granulocytes 1 Not Estab. %   Immature Grans (Abs) 0.0 0.0 - 0.1 x10E3/uL  IgE   Collection Time: 12/16/23 10:30 AM   Result Value Ref Range   IgE (Immunoglobulin E), Serum 37 6 - 495 IU/mL       01/14/2024    9:42 AM 10/15/2023   10:36 AM 05/16/2023   10:24 AM 03/16/2023   10:47 PM 12/14/2022    3:34 PM  Depression screen PHQ 2/9  Decreased Interest 1 1 1  0 1  Down, Depressed, Hopeless 1 1 1  0 1  PHQ - 2 Score 2 2 2  0 2  Altered sleeping 1 1 1  1   Tired, decreased energy 1 1 1  1   Change in appetite 1 1 1  1   Feeling bad or failure about yourself  1 1 1  1   Trouble concentrating 1 1 1  1   Moving slowly or fidgety/restless 1 1 1  1   Suicidal thoughts 0 0 0  0  PHQ-9 Score 8 8 8  8   Difficult doing work/chores Somewhat difficult Somewhat difficult Somewhat difficult  Somewhat difficult       01/14/2024    9:42 AM 10/15/2023   10:36 AM 05/16/2023   10:24 AM 12/14/2022    3:34 PM  GAD 7 : Generalized Anxiety Score  Nervous, Anxious, on Edge 1 1 1 1   Control/stop worrying 1 1 1 1   Worry too much - different things 1 1 1 1   Trouble relaxing 1 1 1 1   Restless 1 0 1 1  Easily annoyed or irritable 1 1 1 1   Afraid - awful might happen 1 1 1 1   Total GAD 7 Score 7 6 7 7   Anxiety Difficulty Somewhat difficult  Somewhat difficult Somewhat difficult      Pertinent labs & imaging results that were available during my care of the patient were reviewed by me and considered in my medical decision making.  Assessment & Plan:  Merci was seen today for er follow up .  Diagnoses and all orders for this visit:  1. Chronic hypoxic respiratory failure (HCC) (Primary) Reviewed imaging as below.  Reviewed notes from Waymond Hailey, MD.  Discussed with patient that she can request to switch pulmonologist, but that she will have to continue to follow up with speciality as the complexity of her care is outside of the scope of primary care. Labs as below. Will communicate results to patient once available. Will await results to determine next steps. She does not request new referral at this time as she would like to stay  in [facility].  Continue O2.  - CBC with Differential/Platelet - CMP14+EGFR - Brain natriuretic peptide - Magnesium   Show images for CT Chest High Resolution   2. Idiopathic pulmonary fibrosis (HCC) As above.   3. Aortic atherosclerosis (HCC) Patient is  intolerant of statins and zetia . Reluctant to try other preventative medications. Recommend that patient follow up with cardiology as planned.   4. Mild intermittent asthma without complication Reviewed imaging as below.  Reviewed notes from Waymond Hailey, MD.  Discussed with patient that she can request to switch pulmonologist, but that she will have to continue to follow up with speciality as the complexity of her care is outside of the scope of primary care. Labs as below. Will communicate results to patient once available. Will await results to determine next steps. She does not request new referral at this time as she would like to stay in [facility]  Continue O2.   5. Pulmonary nodules As above.   6. Type 2 diabetes mellitus with diabetic polyneuropathy, with long-term current use of insulin  (HCC) A1C much improved. Patient is established with endocrinology. Patient to continue to follow with specialty.  - Bayer DCA Hb A1c Waived - Magnesium   7. Chronic heart failure with preserved ejection fraction (HFpEF) (HCC) Labs as below. Will communicate results to patient once available. Will await results to determine next steps.  Established with cardiology.  Reviewed notes from 07/01/2023 with Havery Lions, PA  - Brain natriuretic peptide - Magnesium   8. Vitamin D  deficiency Labs as below. Will communicate results to patient once available. Will await results to determine next steps.  - VITAMIN D  25 Hydroxy (Vit-D Deficiency, Fractures)  9. Moderate episode of recurrent major depressive disorder (HCC) Stable. Established with psychiatry. Denies active plans of self harm. Safety contract established. Patient to follow up with  speciality.   10. Nontoxic single thyroid  nodule Labs as below. Will communicate results to patient once available. Will await results to determine next steps.  Established with endocrinology. Patient to follow up with speciality for further evaluation.  - TSH  11. Hypertension associated with type 2 diabetes mellitus (HCC) BP well controlled. Discussed with patient that ACE/ARB can be useful for renal protection as her microalbumin/creatinine ratio slightly increased. She declines to restart at this time. Recommend that she continue to monitor BP at home.   Continue all other maintenance medications.  Follow up plan: Return in about 3 months (around 04/15/2024) for Chronic Condition Follow up.   Continue healthy lifestyle choices, including diet (rich in fruits, vegetables, and lean proteins, and low in salt and simple carbohydrates) and exercise (at least 30 minutes of moderate physical activity daily).  Written and verbal instructions provided   The above assessment and management plan was discussed with the patient. The patient verbalized understanding of and has agreed to the management plan. Patient is aware to call the clinic if they develop any new symptoms or if symptoms persist or worsen. Patient is aware when to return to the clinic for a follow-up visit. Patient educated on when it is appropriate to go to the emergency department.   Jacqualyn Mates, DNP-FNP [facility] [address] [place], [place] [address] [phone]

## 2024-01-15 ENCOUNTER — Ambulatory Visit: Payer: Self-pay | Admitting: Family Medicine

## 2024-01-15 LAB — CMP14+EGFR
ALT: 11 IU/L (ref 0–32)
AST: 12 IU/L (ref 0–40)
Albumin: 4 g/dL (ref 3.9–4.9)
Alkaline Phosphatase: 145 IU/L — ABNORMAL HIGH (ref 44–121)
BUN/Creatinine Ratio: 24 — ABNORMAL HIGH (ref 9–23)
BUN: 21 mg/dL (ref 6–24)
Bilirubin Total: 0.3 mg/dL (ref 0.0–1.2)
CO2: 21 mmol/L (ref 20–29)
Calcium: 9.9 mg/dL (ref 8.7–10.2)
Chloride: 98 mmol/L (ref 96–106)
Creatinine, Ser: 0.88 mg/dL (ref 0.57–1.00)
Globulin, Total: 3.2 g/dL (ref 1.5–4.5)
Glucose: 120 mg/dL — ABNORMAL HIGH (ref 70–99)
Potassium: 4.5 mmol/L (ref 3.5–5.2)
Sodium: 137 mmol/L (ref 134–144)
Total Protein: 7.2 g/dL (ref 6.0–8.5)
eGFR: 80 mL/min/{1.73_m2} (ref >59)

## 2024-01-15 LAB — CBC WITH DIFFERENTIAL/PLATELET
Basophils Absolute: 0.1 10*3/uL (ref 0.0–0.2)
Basos: 1 %
EOS (ABSOLUTE): 0.1 10*3/uL (ref 0.0–0.4)
Eos: 1 %
Hematocrit: 43.3 % (ref 34.0–46.6)
Hemoglobin: 14.2 g/dL (ref 11.1–15.9)
Immature Grans (Abs): 0 10*3/uL (ref 0.0–0.1)
Immature Granulocytes: 0 %
Lymphocytes Absolute: 3 10*3/uL (ref 0.7–3.1)
Lymphs: 32 %
MCH: 28.7 pg (ref 26.6–33.0)
MCHC: 32.8 g/dL (ref 31.5–35.7)
MCV: 88 fL (ref 79–97)
Monocytes Absolute: 0.5 10*3/uL (ref 0.1–0.9)
Monocytes: 6 %
Neutrophils Absolute: 5.7 10*3/uL (ref 1.4–7.0)
Neutrophils: 60 %
Platelets: 440 10*3/uL (ref 150–450)
RBC: 4.95 x10E6/uL (ref 3.77–5.28)
RDW: 13.4 % (ref 11.7–15.4)
WBC: 9.3 10*3/uL (ref 3.4–10.8)

## 2024-01-15 LAB — BRAIN NATRIURETIC PEPTIDE: BNP: 42.2 pg/mL (ref 0.0–100.0)

## 2024-01-15 LAB — VITAMIN D 25 HYDROXY (VIT D DEFICIENCY, FRACTURES): Vit D, 25-Hydroxy: 39.7 ng/mL (ref 30.0–100.0)

## 2024-01-15 LAB — MAGNESIUM: Magnesium: 1.9 mg/dL (ref 1.6–2.3)

## 2024-01-15 LAB — TSH: TSH: 2.63 u[IU]/mL (ref 0.450–4.500)

## 2024-01-15 NOTE — Progress Notes (Signed)
 A1C is significantly improved. Recommend that patient follow up with endocrinology. Slightly elevated BUN/Crt Ratio, recommend adequate hydration of 80-100 oz of water  daily. Alk phos is slightly elevated, which can be true if patient was not fasting during labs.

## 2024-01-17 ENCOUNTER — Other Ambulatory Visit: Payer: Self-pay | Admitting: Family Medicine

## 2024-01-17 DIAGNOSIS — N39498 Other specified urinary incontinence: Secondary | ICD-10-CM

## 2024-01-24 ENCOUNTER — Encounter: Payer: Self-pay | Admitting: Family Medicine

## 2024-02-08 ENCOUNTER — Other Ambulatory Visit: Payer: Self-pay | Admitting: Family Medicine

## 2024-02-08 DIAGNOSIS — J9611 Chronic respiratory failure with hypoxia: Secondary | ICD-10-CM

## 2024-02-08 DIAGNOSIS — J4520 Mild intermittent asthma, uncomplicated: Secondary | ICD-10-CM

## 2024-02-19 ENCOUNTER — Ambulatory Visit: Payer: MEDICAID | Admitting: Internal Medicine

## 2024-02-26 ENCOUNTER — Ambulatory Visit: Payer: MEDICAID | Attending: Nurse Practitioner | Admitting: Nurse Practitioner

## 2024-02-26 ENCOUNTER — Encounter: Payer: Self-pay | Admitting: Nurse Practitioner

## 2024-02-26 VITALS — BP 130/80 | HR 96 | Ht 66.0 in | Wt 297.8 lb

## 2024-02-26 DIAGNOSIS — I2510 Atherosclerotic heart disease of native coronary artery without angina pectoris: Secondary | ICD-10-CM | POA: Diagnosis present

## 2024-02-26 DIAGNOSIS — I5032 Chronic diastolic (congestive) heart failure: Secondary | ICD-10-CM | POA: Insufficient documentation

## 2024-02-26 DIAGNOSIS — Z8673 Personal history of transient ischemic attack (TIA), and cerebral infarction without residual deficits: Secondary | ICD-10-CM | POA: Insufficient documentation

## 2024-02-26 DIAGNOSIS — E785 Hyperlipidemia, unspecified: Secondary | ICD-10-CM | POA: Diagnosis present

## 2024-02-26 DIAGNOSIS — I10 Essential (primary) hypertension: Secondary | ICD-10-CM | POA: Diagnosis present

## 2024-02-26 NOTE — Patient Instructions (Addendum)
 Medication Instructions:  Your physician has recommended you make the following change in your medication:  Please stop stop Metoprolol  Succinate   Labwork: None   Testing/Procedures: None   Follow-Up: Your physician recommends that you schedule a follow-up appointment in: 6 Months   Any Other Special Instructions Will Be Listed Below (If Applicable).  If you need a refill on your cardiac medications before your next appointment, please call your pharmacy.      Mediterranean Diet  Why follow it? Research shows. Those who follow the Mediterranean diet have a reduced risk of heart disease  The diet is associated with a reduced incidence of Parkinson's and Alzheimer's diseases People following the diet may have longer life expectancies and lower rates of chronic diseases  The Dietary Guidelines for Americans recommends the Mediterranean diet as an eating plan to promote health and prevent disease  What Is the Mediterranean Diet?  Healthy eating plan based on typical foods and recipes of Mediterranean-style cooking The diet is primarily a plant based diet; these foods should make up a majority of meals   Starches - Plant based foods should make up a majority of meals - They are an important sources of vitamins, minerals, energy, antioxidants, and fiber - Choose whole grains, foods high in fiber and minimally processed items  - Typical grain sources include wheat, oats, barley, corn, brown rice, bulgar, farro, millet, polenta, couscous  - Various types of beans include chickpeas, lentils, fava beans, black beans, white beans   Fruits  Veggies - Large quantities of antioxidant rich fruits & veggies; 6 or more servings  - Vegetables can be eaten raw or lightly drizzled with oil and cooked  - Vegetables common to the traditional Mediterranean Diet include: artichokes, arugula, beets, broccoli, brussel sprouts, cabbage, carrots, celery, collard greens, cucumbers, eggplant, kale, leeks,  lemons, lettuce, mushrooms, okra, onions, peas, peppers, potatoes, pumpkin, radishes, rutabaga, shallots, spinach, sweet potatoes, turnips, zucchini - Fruits common to the Mediterranean Diet include: apples, apricots, avocados, cherries, clementines, dates, figs, grapefruits, grapes, melons, nectarines, oranges, peaches, pears, pomegranates, strawberries, tangerines  Fats - Replace butter and margarine with healthy oils, such as olive oil, canola oil, and tahini  - Limit nuts to no more than a handful a day  - Nuts include walnuts, almonds, pecans, pistachios, pine nuts  - Limit or avoid candied, honey roasted or heavily salted nuts - Olives are central to the [employer] - can be eaten whole or used in a variety of dishes   Meats Protein - Limiting red meat: no more than a few times a month - When eating red meat: choose lean cuts and keep the portion to the size of deck of cards - Eggs: approx. 0 to 4 times a week  - Fish and lean poultry: at least 2 a week  - Healthy protein sources include, chicken, [place], lean beef, lamb - Increase intake of seafood such as tuna, salmon, trout, mackerel, shrimp, scallops - Avoid or limit high fat processed meats such as sausage and bacon  Dairy - Include moderate amounts of low fat dairy products  - Focus on healthy dairy such as fat free yogurt, skim milk, low or reduced fat cheese - Limit dairy products higher in fat such as whole or 2% milk, cheese, ice cream  Alcohol - Moderate amounts of red wine is ok  - No more than 5 oz daily for women (all ages) and men older than age 88  - No more than 10 oz  of wine daily for men younger than 71  Other - Limit sweets and other desserts  - Use herbs and spices instead of salt to flavor foods  - Herbs and spices common to the traditional Mediterranean Diet include: basil, bay leaves, chives, cloves, cumin, fennel, garlic, lavender, marjoram, mint, oregano, parsley, pepper, rosemary, sage, savory, sumac,  tarragon, thyme   It's not just a diet, it's a lifestyle:  The Mediterranean diet includes lifestyle factors typical of those in the region  Foods, drinks and meals are best eaten with others and savored Daily physical activity is important for overall good health This could be strenuous exercise like running and aerobics This could also be more leisurely activities such as walking, housework, yard-work, or taking the stairs Moderation is the key; a balanced and healthy diet accommodates most foods and drinks Consider portion sizes and frequency of consumption of certain foods   Meal Ideas & Options:  Breakfast:  Whole wheat toast or whole wheat English muffins with peanut butter & hard boiled egg Steel cut oats topped with apples & cinnamon and skim milk  Fresh fruit: banana, strawberries, melon, berries, peaches  Smoothies: strawberries, bananas, greek yogurt, peanut butter Low fat greek yogurt with blueberries and granola  Egg white omelet with spinach and mushrooms Breakfast couscous: whole wheat couscous, apricots, skim milk, cranberries  Sandwiches:  Hummus and grilled vegetables (peppers, zucchini, squash) on whole wheat bread   Grilled chicken on whole wheat pita with lettuce, tomatoes, cucumbers or tzatziki  [place] salad on whole wheat bread: tuna salad made with greek yogurt, olives, red peppers, capers, green onions Garlic rosemary lamb pita: lamb sauted with garlic, rosemary, salt & pepper; add lettuce, cucumber, greek yogurt to pita - flavor with lemon juice and black pepper  Seafood:  Mediterranean grilled salmon, seasoned with garlic, basil, parsley, lemon juice and black pepper Shrimp, lemon, and spinach whole-grain pasta salad made with low fat greek yogurt  Seared scallops with lemon orzo  Seared tuna steaks seasoned salt, pepper, coriander topped with tomato mixture of olives, tomatoes, olive oil, minced garlic, parsley, green onions and cappers  Meats:  Herbed greek  chicken salad with kalamata olives, cucumber, feta  Red bell peppers stuffed with spinach, bulgur, lean ground beef (or lentils) & topped with feta   Kebabs: skewers of chicken, tomatoes, onions, zucchini, squash  [place] burgers: made with red onions, mint, dill, lemon juice, feta cheese topped with roasted red peppers Vegetarian Cucumber salad: cucumbers, artichoke hearts, celery, red onion, feta cheese, tossed in olive oil & lemon juice  Hummus and whole grain pita points with a greek salad (lettuce, tomato, feta, olives, cucumbers, red onion) Lentil soup with celery, carrots made with vegetable broth, garlic, salt and pepper  Tabouli salad: parsley, bulgur, mint, scallions, cucumbers, tomato, radishes, lemon juice, olive oil, salt and pepper. HEART FAILURE INSTRUCTION SHEET  Follow a low-salt diet-you are allowed no more than 2,000 mg of sodium per day. Watch your fluid intake. In general, you should not be taking more than 64 ounces a day (no more than 8 glasses per day). Sometimes we refer to this as 2 liters per day. This includes sources of water  in food like soup, coffee, tea, milk etc. Weigh yourself on the same scale at the same time of the day preferably immediately after your first void. Keep a log of your weights. Call your doctor: (Anytime you feel any of the following symptoms)  3 lbs weight gain overnight or 5 lbs within  a week Shortness of breath, with or without a day hacking cough Swelling in hands, feet or stomach If you have to sleep on extra pillows at night in order to breathe   IT IS IMPORTANT TO LET YOUR DOCTOR KNOW EARLY ON IF YOU ARE HAVING SYMPTOMS SO WE CAN HELP YOU!

## 2024-02-26 NOTE — Progress Notes (Unsigned)
 Cardiology Office Note   Date: 02/26/2024 ID:  Kathryn Jimenez, DOB [birth year], MRN [id number] PCP: Cathlene Marry Lenis, FNP (Inactive)   [facility] Providers Cardiologist:  Vina Gull, MD     History of Present Illness Kathryn Jimenez is a 53 y.o. female with a PMH of CAD, HFpEF, hypertension, hyperlipidemia, history of SVT, type 2 diabetes, history of CVA in 2018, morbid obesity, PTSD, who presents today for ED follow-up.  Last seen by Kathryn Pavy, PA-C on July 01, 2023.  She was on 2 L of O2 at rest, 4 to 5 L of O2 when moving around, was under a lot of stress, dealing with eating disorder.  Denied any chest pain/palpitations.  Would take an occasional extra Demadex  once or twice a week due to swelling.  Recent ED visit at end of June due to neck pain.  That awoke her in the morning.  Was concerned she had a hardware malfunction as she had fusions years ago.  Denies any chest pain/dizziness/shortness of breath at the time.  Soundly sleeping in recliner due to breathing issues.  Imaging showed no acute fracture or malalignment.  Was started on Robaxin.  Today she presents for follow-up.  She states  Today she presents for ED follow-up.  She admits to recent stress recently. Admits to CP noticed with the heat/humidity, denies any overt chest pain but admits to chest discomfort, difficult for her to explain - wonders if it is also anxiety related. States she can tell this is not heart related. She helps care for her husband who is retired and disabled. SBP averaging 101-110. Denies any shortness of breath, palpitations, syncope, presyncope, dizziness, orthopnea, PND, swelling or significant weight changes, acute bleeding, or claudication. Breathing is stable. Does admit to weight gain but hopes to lose weight, has hx of binge eating since she was a teenager.  She is on her baseline oxygen  requirements and tells me she wears 4 L of O2 in the daytime, 2 L of oxygen  while sleeping, and she  is on room air while sitting. Tells me she is only taking Metoprolol  PRN - says she believes she cannot tolerate this medication, affects her breathing.   ROS: Negative.  See HPI.  Studies Reviewed  Monitor 08/2022:  Rhythm:  Sinus rhythm   Occasional PVC (1% total beats), rare PAC.   Few short bursts NSVT, self limited   Longest 8 beats Triggered event correlated with SR   Right/left heart cath 05/2022:    Ost LAD to Prox LAD lesion is 30% stenosed.   Mid RCA lesion is 40% stenosed.   RPDA lesion is 90% stenosed.   1.  Widely patent left main with no stenosis 2.  Mild nonobstructive proximal LAD stenosis without any high-grade coronary stenoses throughout the LAD distribution 3.  Patent circumflex with no stenosis 4.  Nonobstructive mid RCA stenosis and severe right PDA stenosis, PDA disease distribution is in a distal portion of the vessel not amenable to PCI 5.  Mild pulmonary hypertension with PA pressure of 55/16 mean 33 mmHg, LVEDP 13 mmHg, mean wedge pressure 19 mmHg   Recommendations: Medical therapy   Physical Exam VS:  BP 130/80   Pulse 96   Ht 5' 6 (1.676 m)   Wt 297 lb 12.8 oz (135.1 kg)   SpO2 92%   BMI 48.07 kg/m        Wt Readings from Last 3 Encounters:  02/26/24 297 lb 12.8 oz (135.1 kg)  01/14/24 290 lb (131.5  kg)  12/30/23 294 lb (133.4 kg)    GEN: Morbidly obese, 53 y.o. female in no acute distress, wearing chronic O2 NECK: No JVD; No carotid bruits CARDIAC: S1/S2, RRR, no murmurs, rubs, gallops RESPIRATORY:  Clear and diminished to auscultation without rales, wheezing or rhonchi  ABDOMEN: Soft, non-tender, non-distended EXTREMITIES:  No edema; No deformity   ASSESSMENT AND PLAN  HFpEF Stage C, NYHA class II-III symptoms. EF 60-65% in 2018.  Difficult to ascertain volume status due to her elevated BMI.  Believes metoprolol  is affecting her breathing and admits to some lower SBP readings recently.  Will stop metoprolol  and she will document her  HR/BP readings and update us  in 2 to 3 weeks.  Continue torsemide  and potassium.  GDMT limited due to her allergies - see her allergy list. Low sodium diet, fluid restriction <2L, and daily weights encouraged. Educated to contact our office for weight gain of 2 lbs overnight or 5 lbs in one week.  CAD Admits to atypical chest discomfort, difficult for her to describe, wonders if it is stress related.  Discussed treatment options with patient came to shared medical decision that we will continue to monitor at this time.  No medication changes at this time. Heart healthy diet and regular cardiovascular exercise encouraged. Care and ED precautions discussed.   HTN BP is borderline elevated, but admits to recent lower readings. Stopping Metoprolol  as noted above and she will monitor and update us  with her readings in 2-3 weeks. Discussed to monitor BP at home at least 2 hours after medications and sitting for 5-10 minutes.  No other medication changes at this time. Heart healthy diet and regular cardiovascular exercise encouraged.    HLD LDL 107 09/2023 -this is being managed by her PCP.  Continue current medication regiment.  Continue follow-up with PCP. Heart healthy diet and regular cardiovascular exercise encouraged.   Hx of CVA Denies any new symptoms.  Continue current medication regimen.  Continue follow-up with PCP.  Care knee precautions discussed.  Morbid obesity Weight loss via diet and exercise encouraged. Discussed the impact being overweight would have on cardiovascular risk.   Dispo: Follow-up with MD/APP in 6 months or sooner if any changes.  Signed, Almarie Crate, NP

## 2024-03-02 ENCOUNTER — Encounter: Payer: Self-pay | Admitting: Allergy & Immunology

## 2024-03-02 ENCOUNTER — Encounter: Payer: Self-pay | Admitting: Nurse Practitioner

## 2024-03-02 ENCOUNTER — Ambulatory Visit (INDEPENDENT_AMBULATORY_CARE_PROVIDER_SITE_OTHER): Payer: MEDICAID | Admitting: Allergy & Immunology

## 2024-03-02 ENCOUNTER — Other Ambulatory Visit: Payer: Self-pay

## 2024-03-02 VITALS — BP 110/62 | HR 103 | Temp 97.3°F | Resp 16 | Ht 64.17 in | Wt 296.0 lb

## 2024-03-02 DIAGNOSIS — R053 Chronic cough: Secondary | ICD-10-CM | POA: Diagnosis not present

## 2024-03-02 DIAGNOSIS — B999 Unspecified infectious disease: Secondary | ICD-10-CM | POA: Diagnosis not present

## 2024-03-02 DIAGNOSIS — J45991 Cough variant asthma: Secondary | ICD-10-CM | POA: Diagnosis not present

## 2024-03-02 DIAGNOSIS — J310 Chronic rhinitis: Secondary | ICD-10-CM

## 2024-03-02 NOTE — Patient Instructions (Addendum)
 1. Cough variant asthma with component UACS with chronic cough and hypersensitivity pneumonitis - Continue to follow with Dr. Darlean as you are doing. - We are not going to make any changes at this time.  - Continue to use albuterol  as needed like you are doing.   2. Chronic rhinitis - Because of insurance stipulations, we cannot do skin testing on the same day as your first visit. - We are all working to fight this, but for now we need to do two separate visits.  - We will know more after we do testing at the next visit.  - The skin testing visit can be squeezed in at your convenience.  - We are going to get some blood work to look for environmental allergies. - Then we can make a more full plan to address all of your symptoms. - Be sure to stop your antihistamines for 3 days before this appointment.  - You can continue with the Singulair  (montelukast ) daily (this will not affect the testing at all).   3. Recurrent infections - lots of pneumonias - We will obtain some screening labs to evaluate your immune system.  - Labs to evaluate the quantitative [facility]) aspects of your immune system: IgG/IgA/IgM, CBC with differential - Labs to evaluate the qualitative (HOW WELL THEY WORK) aspects of your immune system: CH50, Pneumococcal titers, Tetanus titers, Diphtheria titers - We may consider immunizations with Pneumovax and Tdap to challenge your immune system, and then obtain repeat titers in 4-6 weeks.   4. Return in about 2 weeks (around 03/16/2024) for INTRADERMAL TESTING. You can have the follow up appointment with Dr. Iva or a Nurse Practicioner (our Nurse Practitioners are excellent and always have Physician oversight!).    Please inform us  of any Emergency Department visits, hospitalizations, or changes in symptoms. Call us  before going to the ED for breathing or allergy symptoms since we might be able to fit you in for a sick visit. Feel free to contact us  anytime with any  questions, problems, or concerns.  It was a pleasure to meet you today!  Websites that have reliable patient information: 1. American Academy of Asthma, Allergy, and Immunology: www.aaaai.org 2. Food Allergy Research and Education (FARE): foodallergy.org 3. Mothers of Asthmatics: http://www.asthmacommunitynetwork.org 4. American College of Allergy, Asthma, and Immunology: www.acaai.org      "Like" us  on Facebook and Instagram for our latest updates!      A healthy democracy works best when [employer] participate! Make sure you are registered to vote! If you have moved or changed any of your contact information, you will need to get this updated before voting! Scan the QR codes below to learn more!

## 2024-03-02 NOTE — Progress Notes (Signed)
 NEW PATIENT  Date of Service/Encounter:  03/02/24  Consult requested by: Cathlene Marry Lenis, FNP (Inactive)   Assessment:   Cough variant asthma with imaging concern for hypersensitivity pneumonitis  Chronic cough - mu multifactorial   Chronic rhinitis  Recurrent infections - getting immune workup  Plan/Recommendations:   1. Cough variant asthma with component UACS with chronic cough and hypersensitivity pneumonitis - Continue to follow with Dr. Darlean as you are doing. - We are not going to make any changes at this time.  - Continue to use albuterol  as needed like you are doing.   2. Chronic rhinitis - Because of insurance stipulations, we cannot do skin testing on the same day as your first visit. - We are all working to fight this, but for now we need to do two separate visits.  - We will know more after we do testing at the next visit.  - The skin testing visit can be squeezed in at your convenience.  - We are going to get some blood work to look for environmental allergies. - Then we can make a more full plan to address all of your symptoms. - Be sure to stop your antihistamines for 3 days before this appointment.  - You can continue with the Singulair  (montelukast ) daily (this will not affect the testing at all).   3. Recurrent infections - lots of pneumonias - We will obtain some screening labs to evaluate your immune system.  - Labs to evaluate the quantitative [facility]) aspects of your immune system: IgG/IgA/IgM, CBC with differential - Labs to evaluate the qualitative (HOW WELL THEY WORK) aspects of your immune system: CH50, Pneumococcal titers, Tetanus titers, Diphtheria titers - We may consider immunizations with Pneumovax and Tdap to challenge your immune system, and then obtain repeat titers in 4-6 weeks.   4. Return in about 2 weeks (around 03/16/2024) for INTRADERMAL TESTING. You can have the follow up appointment with Dr. Iva or a Nurse  Practicioner (our Nurse Practitioners are excellent and always have Physician oversight!).    This note in its entirety was forwarded to the Provider who requested this consultation.  Subjective:   Kathryn Jimenez is a 53 y.o. female presenting today for evaluation of  Chief Complaint  Patient presents with   Breathing Problem    Been referred because of lung issues    Kathryn Jimenez has a history of the following: Patient Active Problem List   Diagnosis Date Noted   Pulmonary nodules 01/14/2024   Idiopathic pulmonary fibrosis (HCC) 01/14/2024   Aortic atherosclerosis (HCC) 01/14/2024   NSIP (nonspecific interstitial pneumonia) (HCC) 11/23/2023   DOE (dyspnea on exertion) 08/08/2023   Cough variant asthma withcomponent UACS 08/08/2023   Impaired mobility 05/16/2023   Hypertension associated with type 2 diabetes mellitus (HCC) 04/07/2023   CVA (cerebral vascular accident) (HCC) 04/07/2023   Chronic respiratory failure with hypoxia and hypercapnia (HCC) 03/16/2023   CAD (coronary artery disease) 03/16/2023   Chronic heart failure with preserved ejection fraction (HFpEF) (HCC) 06/18/2022   SVT (supraventricular tachycardia) (HCC) 06/18/2022   Vitamin B12 deficiency 05/07/2021   Goiter 09/12/2020   Hereditary and idiopathic neuropathy, unspecified 09/12/2020   Nontoxic single thyroid  nodule 09/12/2020   Morbid obesity with BMI of 40.0-44.9, adult (HCC) 01/12/2020   Urinary incontinence 01/12/2020   Morbid obesity (HCC) 03/19/2019   Labia enlarged 05/29/2018   Abdominal pannus 05/29/2018   Small vessel disease, cerebrovascular 09/30/2017   Diabetic polyneuropathy associated with type 2 diabetes mellitus (  HCC) 07/01/2017   Vitamin D  deficiency 07/01/2017   Depression 01/11/2015   PTSD (post-traumatic stress disorder) 01/11/2015   Type 2 diabetes mellitus, with long-term current use of insulin  (HCC) 01/11/2015   Primary hypertension 01/11/2015   Hyperlipidemia associated with type  2 diabetes mellitus (HCC) 01/11/2015   Edema 01/11/2015    History obtained from: chart review and patient.  Discussed the use of AI scribe software for clinical note transcription with the patient and/or guardian, who gave verbal consent to proceed.  [name] was referred by Cathlene Marry Lenis, FNP (Inactive).     Kathryn Jimenez is a 53 y.o. female presenting for an evaluation of shortness of breath and environmental allergies.   Asthma/Respiratory Symptom History: She has a history of chronic cough that has persisted despite multiple treatments. Initially, the cough was severe enough to cause incontinence, but it has improved with the use of Neurontin  and an inhaler, reducing the frequency to two or three times a day. She is currently using Symbicort  80 mcg twice daily and albuterol  as needed, approximately once every two to three days.  She was diagnosed with congestive heart failure and pneumonia in late September to early October 2023, which required a three-day hospital stay. During this time, she was placed on oxygen  therapy and continues to use it at varying levels depending on her activity: up to four liters when active, two liters when resting, and none when sitting.  It looks like she was first seen in December 2024. At that visit, she was needing up to 6 LPM of O2. She was given 120mg  DepoMedrol in the clinic on that day.   High-resolution chest CT (March 2025)  1. Spectrum of findings compatible with fibrotic interstitial lung disease with an upper lobe predominance and relative sparing of the lower lobes. No honeycombing. Mild patchy air trapping in both lungs. Findings are most compatible with chronic hypersensitivity pneumonitis. Findings are suggestive of an alternative diagnosis (not UIP) per consensus guidelines: Diagnosis of Idiopathic Pulmonary Fibrosis: An Official ATS/ERS/JRS/ALAT Clinical Practice Guideline. Am JINNY [employer] Crit Care Med Vol 198, Iss 5, [phone], Apr 20 2017.  2. A few scattered solid pulmonary nodules, largest 0.6 cm in the posterior right lower lobe. Non-contrast chest CT at 3-6 months is recommended. If the nodules are stable at time of repeat CT, then future CT at 18-24 months (from today's scan) is considered optional for low-risk patients, but is recommended for high-risk patients. This recommendation follows the consensus statement: Guidelines for Management of Incidental Pulmonary Nodules Detected on CT Images: From the Fleischner Society 2017; Radiology 2017; 284:228-243.  3. Mild mediastinal and bilateral hilar adenopathy, nonspecific, favor reactive.  4. Dilated main pulmonary artery, suggesting pulmonary arterial hypertension.  5. Two-vessel coronary atherosclerosis.  6.  Aortic Atherosclerosis (ICD10-I70.0).  Allergic Rhinitis Symptom History: She has a history of allergies since childhood, experiencing year-round symptoms both indoors and outdoors. She has been on various allergy medications, including Claritin , Zyrtec , and Allegra, but has had adverse reactions such as dryness and epistaxis.  She has a complex medical history including diabetes, for which she has received pneumonia vaccinations. She avoids flu shots due to adverse reactions. She has a history of PTSD and depression, previously treated with medications like Zoloft and trazodone.  Her family history includes her father, who died of lung cancer related to Agent Orange exposure. Her husband has had severe health issues, including congestive heart failure and stage three kidney disease, but has shown improvement recently.  Infection Symptom History: She has had two pneumonia shots. She has gotten a few bouts of pneumonia and she has had several sinus infections. Her frequency of infections has decreased somewhat as of late, but she still gets antibiotics more frequently than others.   Otherwise, there is no history of other atopic diseases, including drug allergies,  stinging insect allergies, or contact dermatitis. There is no significant infectious history. Vaccinations are up to date.    Past Medical History: Patient Active Problem List   Diagnosis Date Noted   Pulmonary nodules 01/14/2024   Idiopathic pulmonary fibrosis (HCC) 01/14/2024   Aortic atherosclerosis (HCC) 01/14/2024   NSIP (nonspecific interstitial pneumonia) (HCC) 11/23/2023   DOE (dyspnea on exertion) 08/08/2023   Cough variant asthma withcomponent UACS 08/08/2023   Impaired mobility 05/16/2023   Hypertension associated with type 2 diabetes mellitus (HCC) 04/07/2023   CVA (cerebral vascular accident) (HCC) 04/07/2023   Chronic respiratory failure with hypoxia and hypercapnia (HCC) 03/16/2023   CAD (coronary artery disease) 03/16/2023   Chronic heart failure with preserved ejection fraction (HFpEF) (HCC) 06/18/2022   SVT (supraventricular tachycardia) (HCC) 06/18/2022   Vitamin B12 deficiency 05/07/2021   Goiter 09/12/2020   Hereditary and idiopathic neuropathy, unspecified 09/12/2020   Nontoxic single thyroid  nodule 09/12/2020   Morbid obesity with BMI of 40.0-44.9, adult (HCC) 01/12/2020   Urinary incontinence 01/12/2020   Morbid obesity (HCC) 03/19/2019   Labia enlarged 05/29/2018   Abdominal pannus 05/29/2018   Small vessel disease, cerebrovascular 09/30/2017   Diabetic polyneuropathy associated with type 2 diabetes mellitus (HCC) 07/01/2017   Vitamin D  deficiency 07/01/2017   Depression 01/11/2015   PTSD (post-traumatic stress disorder) 01/11/2015   Type 2 diabetes mellitus, with long-term current use of insulin  (HCC) 01/11/2015   Primary hypertension 01/11/2015   Hyperlipidemia associated with type 2 diabetes mellitus (HCC) 01/11/2015   Edema 01/11/2015    Medication List:  Allergies as of 03/02/2024       Reactions   Actos [pioglitazone] Swelling   Augmentin [amoxicillin-pot Clavulanate] Diarrhea, Nausea Only, Other (See Comments)   Caused severe diarrhea    Citrus Other (See Comments)   Issues with my tongue- the fleshy part of the fruit   Invokana [canagliflozin] Other (See Comments)   Yeast infection   Lopid  [gemfibrozil ] Other (See Comments)   Made the tongue burn   Metoprolol  Other (See Comments)   Headaches   Naproxen Sodium Other (See Comments)   Patient states she felt loopy   Penicillins Diarrhea, Other (See Comments)   Violent diarrhea- must have Diflucan    Pineapple Other (See Comments)   Issues with my tongue   Statins Other (See Comments)   Joint pain   Tape Itching, Other (See Comments)   Tape, EKG leads, and Band-Aids - Itching and redness   Zetia  [ezetimibe ] Other (See Comments)   Headaches    Amlodipine  Cough   Clavulanic Acid Nausea Only   Hctz [hydrochlorothiazide] Rash   Lisinopril  Cough   Morphine And Codeine Itching, Rash        Medication List        Accurate as of March 02, 2024  1:13 PM. If you have any questions, ask your nurse or doctor.          Accu-Chek Guide Test test strip Generic drug: glucose blood Use to check blood glucose four times daily as instructed   Accu-Chek Softclix Lancets lancets Use to check blood glucose four times daily as instructed   acetaminophen  500 MG tablet Commonly  known as: TYLENOL  Take 500 mg by mouth every 6 (six) hours as needed for mild pain or moderate pain.   albuterol  108 (90 Base) MCG/ACT inhaler Commonly known as: VENTOLIN  HFA inhale two puffs into the lungs every 6 (six) hours as needed for wheezing.   budesonide -formoterol  80-4.5 MCG/ACT inhaler Commonly known as: Symbicort  Take 2 puffs first thing in am and then another 2 puffs about 12 hours later.   buPROPion  150 MG 24 hr tablet Commonly known as: WELLBUTRIN  XL Take 150 mg by mouth daily.   clopidogrel  75 MG tablet Commonly known as: PLAVIX  Take 1 tablet (75 mg total) by mouth daily.   clotrimazole -betamethasone  cream Commonly known as: LOTRISONE  Apply thin layer to affected  external area up to twice daily as needed   Dexcom G7 Sensor Misc Inject 1 Application into the skin as directed. Change sensor every 10 days as directed.   diphenhydrAMINE 25 mg capsule Commonly known as: BENADRYL Take 25 mg by mouth every 6 (six) hours as needed for allergies.   fluconazole  150 MG tablet Commonly known as: DIFLUCAN  Take one pill every 3 days   FLUoxetine  40 MG capsule Commonly known as: PROZAC  Take 40 mg by mouth daily.   gabapentin  300 MG capsule Commonly known as: Neurontin  Take 1 capsule (300 mg total) by mouth 2 (two) times daily.   Lantus  SoloStar 100 UNIT/ML Solostar Pen Generic drug: insulin  glargine Inject 35 Units into the skin at bedtime.   Liletta  (52 MG) 20.1 MCG/DAY Iud IUD Generic drug: levonorgestrel  1 each by Intrauterine route once. Inserted 04/19/21   loratadine  10 MG tablet Commonly known as: CLARITIN  Take 1 tablet (10 mg total) by mouth daily.   LORazepam  0.5 MG tablet Commonly known as: ATIVAN  Take 0.25-0.5 mg by mouth daily as needed for anxiety.   montelukast  10 MG tablet Commonly known as: SINGULAIR  take 1 tablet (10 milligram total) by mouth at bedtime.   NON FORMULARY Vit d3 with K2 taking once a day   NovoLOG  FlexPen 100 UNIT/ML FlexPen Generic drug: insulin  aspart Inject 14-20 Units into the skin 3 (three) times daily with meals.   nystatin  powder Commonly known as: MYCOSTATIN /NYSTOP  Apply 1 application  topically daily as needed (for fungal infections).   OVER THE COUNTER MEDICATION Patient states that she is taking Vitamin K2+D 3 - patient states that she takes 2 a day.   oxybutynin  10 MG 24 hr tablet Commonly known as: DITROPAN -XL take 1 tablet (10 MILLIGRAM total) by mouth at bedtime.   OXYGEN  Inhale 4 L/min into the lungs continuous.   Pen Needles 31G X 6 MM Misc Use to inject insulin  4 times daily   potassium chloride  10 MEQ tablet Commonly known as: KLOR-CON  Take 10 mEq by mouth 2 (two) times  daily.   Restasis  0.05 % ophthalmic emulsion Generic drug: cycloSPORINE  Place 1 drop into both eyes 2 (two) times daily.   torsemide  20 MG tablet Commonly known as: DEMADEX  Take 1-2 tablets (20-40 mg total) by mouth daily. Can take extra tablet with weight gain greater than 3 lb in 24 hours   valACYclovir  500 MG tablet Commonly known as: VALTREX  Take 1 tablet (500 mg total) by mouth daily.        Birth History: non-contributory  Developmental History: non-contributory  Past Surgical History: Past Surgical History:  Procedure Laterality Date   CARPAL TUNNEL RELEASE     left 2015   CERVICAL FUSION Bilateral 2003   C 5-6 with titanium screws  CHOLECYSTECTOMY     COLONOSCOPY WITH PROPOFOL  N/A 06/24/2014   Procedure: COLONOSCOPY WITH PROPOFOL ;  Surgeon: Claudis RAYMOND Rivet, MD;  Location: [facility];  Service: Endoscopy;  Laterality: N/A;  cecum time in 0810    time out   0821    total time 11 minutes   DILITATION & CURRETTAGE/HYSTROSCOPY WITH HYDROTHERMAL ABLATION N/A 04/19/2021   Procedure: DILATATION & CURETTAGE/HYSTEROSCOPY WITH HYDROTHERMAL ABLATION;  Surgeon: Ozan, Jennifer, DO;  Location: [facility];  Service: Gynecology;  Laterality: N/A;   INTRAUTERINE DEVICE (IUD) INSERTION N/A 04/19/2021   Procedure: INTRAUTERINE DEVICE (IUD) INSERTION;  Surgeon: Ozan, Jennifer, DO;  Location: [facility];  Service: Gynecology;  Laterality: N/A;   POLYPECTOMY N/A 06/24/2014   Procedure: RECTAL POLYPECTOMY;  Surgeon: Claudis RAYMOND Rivet, MD;  Location: [facility];  Service: Endoscopy;  Laterality: N/A;   RIGHT/LEFT HEART CATH AND CORONARY ANGIOGRAPHY N/A 06/19/2022   Procedure: RIGHT/LEFT HEART CATH AND CORONARY ANGIOGRAPHY;  Surgeon: Wonda Sharper, MD;  Location: [facility] INVASIVE CV LAB;  Service: Cardiovascular;  Laterality: N/A;   SPINE SURGERY     URETHRAL DILATION  1981   WISDOM TOOTH EXTRACTION Bilateral 2001 and 1990s   top and bottom      Family History: Family History  Problem Relation Age of Onset    COPD Mother    Arthritis Mother    Asthma Mother    Depression Mother    Diabetes Mother    Heart disease Mother    Hyperlipidemia Mother    Hypertension Mother    Mental illness Mother    Skin cancer Mother    Lung cancer Father    Arthritis Father    Depression Father    Drug abuse Father    Early death Father    Kidney disease Father    Mental illness Father    Colon cancer Sister    Birth defects Sister    COPD Sister    Hyperlipidemia Sister    Learning disabilities Sister    Breast cancer Sister    Breast cancer Sister    Colon cancer Sister    Alcohol abuse Paternal Grandmother    Diabetes Paternal Grandmother    Alcohol abuse Paternal Grandfather      Social History: Jassmine lives at home with her husband.  She lives in a house that is near his home.  There is voiding carpeting throughout the home.  They have electric and kerosene heating as well as many brands for Collings.  There was a dog several years ago, but he passed in 2023.  There are no dust mite covers in the bedding.  There is tobacco exposure occasionally in the house.  Her husband is in the midst of quitting smoking.  She is currently on disability.  There is no fume, chemical, or dust exposure in her job, but she does have dust at her house.  There is no HEPA filter.  They do not live near their steel industrial area.  Review of systems otherwise negative other than that mentioned in the HPI.    Objective:   Blood pressure 110/62, pulse (!) 103, temperature (!) 97.3 F (36.3 C), temperature source Temporal, resp. rate 16, height 5' 4.17 (1.63 m), weight 296 lb (134.3 kg), SpO2 (!) 82%. Body mass index is 50.53 kg/m.     Physical Exam Vitals reviewed.  Constitutional:      Appearance: She is well-developed. She is not ill-appearing, toxic-appearing or diaphoretic.     Comments: Oxygen  in place. Tearful  at times.   HENT:     Head: Normocephalic and atraumatic.     Right Ear: Tympanic membrane,  ear canal and external ear normal. No drainage, swelling or tenderness. Tympanic membrane is not injected, scarred, erythematous, retracted or bulging.     Left Ear: Tympanic membrane, ear canal and external ear normal. No drainage, swelling or tenderness. Tympanic membrane is not injected, scarred, erythematous, retracted or bulging.     Nose: Rhinorrhea present. No nasal deformity, septal deviation or mucosal edema.     Right Turbinates: Enlarged and swollen.     Left Turbinates: Enlarged and swollen.     Right Sinus: No maxillary sinus tenderness or frontal sinus tenderness.     Left Sinus: No maxillary sinus tenderness or frontal sinus tenderness.     Mouth/Throat:     Lips: Pink.     Mouth: Mucous membranes are moist. Mucous membranes are not pale and not dry.     Pharynx: Uvula midline.  Eyes:     General: Lids are normal. Allergic shiner present.        Right eye: No discharge.        Left eye: No discharge.     Conjunctiva/sclera: Conjunctivae normal.     Right eye: Right conjunctiva is not injected. No chemosis.    Left eye: Left conjunctiva is not injected. No chemosis.    Pupils: Pupils are equal, round, and reactive to light.  Cardiovascular:     Rate and Rhythm: Normal rate and regular rhythm.     Heart sounds: Normal heart sounds.  Pulmonary:     Effort: Pulmonary effort is normal. No tachypnea, accessory muscle usage or respiratory distress.     Breath sounds: Normal breath sounds. Decreased air movement and transmitted upper airway sounds present. No decreased breath sounds, wheezing, rhonchi or rales.  Chest:     Chest wall: No tenderness.  Abdominal:     Tenderness: There is no abdominal tenderness. There is no guarding or rebound.  Lymphadenopathy:     Head:     Right side of head: No submandibular, tonsillar or occipital adenopathy.     Left side of head: No submandibular, tonsillar or occipital adenopathy.     Cervical: No cervical adenopathy.  Skin:     Coloration: Skin is not pale.     Findings: No abrasion, erythema, petechiae or rash. Rash is not papular, urticarial or vesicular.  Neurological:     Mental Status: She is alert.  Psychiatric:        Behavior: Behavior is cooperative.      Diagnostic studies:            Marty Shaggy, MD [facility] 

## 2024-03-03 ENCOUNTER — Ambulatory Visit: Payer: MEDICAID | Admitting: Nurse Practitioner

## 2024-03-06 ENCOUNTER — Ambulatory Visit: Payer: Self-pay | Admitting: Allergy & Immunology

## 2024-03-08 LAB — STREP PNEUMONIAE 23 SEROTYPES IGG
Pneumo Ab Type 1*: 0.2 ug/mL — ABNORMAL LOW (ref >1.3)
Pneumo Ab Type 12 (12F)*: <0.1 ug/mL — ABNORMAL LOW (ref >1.3)
Pneumo Ab Type 14*: 2.7 ug/mL (ref >1.3)
Pneumo Ab Type 17 (17F)*: 0.2 ug/mL — ABNORMAL LOW (ref >1.3)
Pneumo Ab Type 19 (19F)*: 2.4 ug/mL (ref >1.3)
Pneumo Ab Type 2*: 1.6 ug/mL (ref >1.3)
Pneumo Ab Type 20*: 0.9 ug/mL — ABNORMAL LOW (ref >1.3)
Pneumo Ab Type 22 (22F)*: <0.1 ug/mL — ABNORMAL LOW (ref >1.3)
Pneumo Ab Type 23 (23F)*: 1.3 ug/mL — ABNORMAL LOW (ref >1.3)
Pneumo Ab Type 26 (6B)*: <0.1 ug/mL — ABNORMAL LOW (ref >1.3)
Pneumo Ab Type 3*: <0.1 ug/mL — ABNORMAL LOW (ref >1.3)
Pneumo Ab Type 34 (10A)*: <0.1 ug/mL — ABNORMAL LOW (ref >1.3)
Pneumo Ab Type 4*: >20.8 ug/mL (ref >1.3)
Pneumo Ab Type 43 (11A)*: 1.2 ug/mL — ABNORMAL LOW (ref >1.3)
Pneumo Ab Type 5*: <0.1 ug/mL — ABNORMAL LOW (ref >1.3)
Pneumo Ab Type 51 (7F)*: 1.5 ug/mL (ref >1.3)
Pneumo Ab Type 54 (15B)*: 20.2 ug/mL (ref >1.3)
Pneumo Ab Type 56 (18C)*: 0.8 ug/mL — ABNORMAL LOW (ref >1.3)
Pneumo Ab Type 57 (19A)*: 1.2 ug/mL — ABNORMAL LOW (ref >1.3)
Pneumo Ab Type 68 (9V)*: 4.7 ug/mL (ref >1.3)
Pneumo Ab Type 70 (33F)*: 2.8 ug/mL (ref >1.3)
Pneumo Ab Type 8*: 2.5 ug/mL (ref >1.3)
Pneumo Ab Type 9 (9N)*: 1.8 ug/mL (ref >1.3)

## 2024-03-08 LAB — ALLERGEN PROFILE, MOLD
Aureobasidi Pullulans IgE: <0.1 kU/L
Candida Albicans IgE: <0.1 kU/L
M009-IgE Fusarium proliferatum: <0.1 kU/L
M014-IgE Epicoccum purpur: <0.1 kU/L
Mucor Racemosus IgE: <0.1 kU/L
Phoma Betae IgE: <0.1 kU/L
Setomelanomma Rostrat: <0.1 kU/L
Stemphylium Herbarum IgE: <0.1 kU/L

## 2024-03-08 LAB — CBC WITH DIFF/PLATELET
Basophils Absolute: 0.1 x10E3/uL (ref 0.0–0.2)
Basos: 1 %
EOS (ABSOLUTE): 0.1 x10E3/uL (ref 0.0–0.4)
Eos: 1 %
Hematocrit: 45.2 % (ref 34.0–46.6)
Hemoglobin: 14.5 g/dL (ref 11.1–15.9)
Immature Grans (Abs): 0 x10E3/uL (ref 0.0–0.1)
Immature Granulocytes: 0 %
Lymphocytes Absolute: 2.9 x10E3/uL (ref 0.7–3.1)
Lymphs: 32 %
MCH: 28.1 pg (ref 26.6–33.0)
MCHC: 32.1 g/dL (ref 31.5–35.7)
MCV: 88 fL (ref 79–97)
Monocytes Absolute: 0.6 x10E3/uL (ref 0.1–0.9)
Monocytes: 7 %
Neutrophils Absolute: 5.5 x10E3/uL (ref 1.4–7.0)
Neutrophils: 59 %
Platelets: 414 x10E3/uL (ref 150–450)
RBC: 5.16 x10E6/uL (ref 3.77–5.28)
RDW: 13.5 % (ref 11.7–15.4)
WBC: 9.1 x10E3/uL (ref 3.4–10.8)

## 2024-03-08 LAB — ALLERGENS W/COMP RFLX AREA 2
Alternaria Alternata IgE: <0.1 kU/L
Aspergillus Fumigatus IgE: <0.1 kU/L
Bermuda Grass IgE: <0.1 kU/L
Cedar, Mountain IgE: <0.1 kU/L
Cladosporium Herbarum IgE: <0.1 kU/L
Cockroach, German IgE: <0.1 kU/L
Common Silver Birch IgE: <0.1 kU/L
Cottonwood IgE: <0.1 kU/L
D Farinae IgE: 0.37 kU/L — AB
D Pteronyssinus IgE: 0.38 kU/L — AB
E001-IgE Cat Dander: <0.1 kU/L
E005-IgE Dog Dander: <0.1 kU/L
Elm, American IgE: <0.1 kU/L
IgE (Immunoglobulin E), Serum: 46 [IU]/mL (ref 6–495)
Johnson Grass IgE: <0.1 kU/L
Maple/Box Elder IgE: <0.1 kU/L
Mouse Urine IgE: <0.1 kU/L
Oak, White IgE: <0.1 kU/L
Pecan, Hickory IgE: <0.1 kU/L
Penicillium Chrysogen IgE: <0.1 kU/L
Pigweed, Rough IgE: <0.1 kU/L
Ragweed, Short IgE: <0.1 kU/L
Sheep Sorrel IgE Qn: <0.1 kU/L
Timothy Grass IgE: <0.1 kU/L
White Mulberry IgE: <0.1 kU/L

## 2024-03-08 LAB — IGG, IGA, IGM
IgA/Immunoglobulin A, Serum: 348 mg/dL (ref 87–352)
IgG (Immunoglobin G), Serum: 1485 mg/dL (ref 586–1602)
IgM (Immunoglobulin M), Srm: 96 mg/dL (ref 26–217)

## 2024-03-08 LAB — DIPHTHERIA / TETANUS ANTIBODY PANEL
Diphtheria Ab: 2.06 [IU]/mL (ref <0.10)
Tetanus Ab, IgG: >7 [IU]/mL (ref <0.10)

## 2024-03-08 LAB — COMPLEMENT, TOTAL: Compl, Total (CH50): >60 U/mL (ref >41)

## 2024-03-09 ENCOUNTER — Ambulatory Visit: Payer: Self-pay | Admitting: *Deleted

## 2024-03-09 NOTE — Telephone Encounter (Signed)
 I spoke to pt and advised we don't have any in office openings today but offered Virtual visit today or in office tomorrow with DOD but pt declined stating she is in too much pain and feels she has had a fever but has no way to check temperature, pt also c/o some SOB.Pain is 8 of 10 on pain scale. Advised pt to go to ED for further evaluation. Pt voiced understanding.

## 2024-03-09 NOTE — Telephone Encounter (Signed)
 Reason for Disposition  [1] SEVERE pain (e.g., excruciating) AND [2] not improved after 2 hours of pain medicine  Answer Assessment - Initial Assessment Questions 1. ONSET: When did the pain start? (e.g., minutes, hours, days)     I'm having pain and swelling in the left side of my face.  It's so hot and the pain was intense.   Sat. 2:00 AM I started Tylenol  and it's hardly touching the pain.   I thought it was my teeth.   I saw my teeth and they said my teeth were fine.   Now th pain has come back my left nostril is even on fire.    I'm on oxygen  too. I think it may be a sinus infection.   My face and mouth are painful and my ear, muscles around my teeth and in front of my face too.   I've had sinus infections before and they hurt like this. 2. ONSET: Does the pain come and go, or has it been constant since it started? (e.g., constant, intermittent, fleeting)     Started Friday but pain was bad Sat at 2:00 AM. 3. SEVERITY: How bad is the pain? (Scale 1-10; mild, moderate or severe)     It was severe  4. LOCATION: Where does it hurt?      Whole left side of my face and mouth. 5. RASH: Is there any redness, rash, or swelling of your face?     Swelling 6. FEVER: Do you have a fever? If Yes, ask: What is it, how was it measured, and when did it start?      I'm sure I am.    7. OTHER SYMPTOMS: Do you have any other symptoms? (e.g., fever, toothache, nasal discharge, nasal congestion, clicking sensation in jaw joint)     Sinus congestion, my face feels like it's on fire on the left side.  I have some sinus congestion. 8. PREGNANCY: Is there any chance you are pregnant? When was your last menstrual period?     Not asked  Protocols used: Face Pain-A-AH FYI Only or Action Required?: Action required by provider: request for appointment.  Patient was last seen in primary care on 01/14/2024 by Cathlene Marry Lenis, FNP.  Called Nurse Triage reporting Facial Pain. Whole left  side of face is painful with some swelling.  Dentist said everything was ok.  Having sinus congestion.   Gets sinus infections that feel like this with a lot of pain.  Symptoms began several days ago.  Interventions attempted: Rest, hydration, or home remedies.  Symptoms are: gradually worsening.  Triage Disposition: See HCP Within 4 Hours (Or PCP Triage)  Patient/caregiver understands and will follow disposition?:  Yes Is it possible she can be seen today by any provider.  Was pt of Marry but is to start seeing Annabella Search, NP.   No appts until July 23 with any provider when schedule checked.    Number in chart is correct. High priority message sent to practice to see if she can be worked in today.  Called before the office was open.

## 2024-03-12 NOTE — Telephone Encounter (Signed)
 I called Kathryn Jimenez to schedule NAC 80.  Kathryn Jimenez states she didn't know anything about it and wants to wait until she gets her intradermal testing done and see what those results are before she does NAC 80.

## 2024-03-15 ENCOUNTER — Encounter: Payer: Self-pay | Admitting: Internal Medicine

## 2024-03-15 NOTE — Progress Notes (Deleted)
 Kathryn Jimenez, female    DOB: 1970-12-08    MRN: [id number]   Brief patient profile:  8  yowf never smoker grew up around smokers with lots of resp infections / stuffy head and bronchitis dx as asthma age 40s   referred to pulmonary clinic in [place]  08/08/2023   for cough x sept 2023  and 02 dep after pna  02/2023      History of Present Illness  08/08/2023  Pulmonary/ 1st Jimenez eval/ Kathryn Jimenez / Kathryn Jimenez symbicort  160 / incruse/singulair  > very dry mouth and nose (not on humidified 02)  Chief Complaint  Patient presents with   Establish Care   Shortness of Breath    On O2/Adapt, 4-6L w/ambulation, 2L at rest  Dyspnea:  only goes out to go to doctor's Jimenez last walked at [facility]  2lpm at rest and needed up to 6opm  walking  Cough: varies sometimes wakes her up non productive and quite violent assoc with urinary incont and midline cp but no gagging Sleep: 45 degrees in recliner  SABA use: hfa couple times a week / no neb  02: sept 2023 p hosp > restarted back on it sev months prior to OV  p admit with dx cap/ chf. Rec Add water  to your oxygen  equipment  Pantoprazole  (protonix ) 40 mg   Take  30-60 min before first meal of the day and Pepcid  (famotidine )  20 mg after supper until return to Jimenez - this is the best way to tell whether stomach acid is contributing to your problem.   Use sugarless jolly ranchers to suppress urge to cough  Plan A = Automatic = Always=    Symbicort  80 1-2 puffs every 12 hours (take one if coughing and 2 if breathing worse but goal is to use the lower dose if possible)  Plan B  = Backup (to supplement plan A, not to replace it) Only use your albuterol  inhaler as a rescue medication Depomedrol 120 mg IM today  We will walk you today to establish new 0xygen needs  Please schedule a follow up Jimenez visit in 6 weeks, call sooner if needed with all medications /inhalers/ solutions in hand     Allergy screen 08/08/23  >  Eos 0.1 /  IgE   54    09/20/2023  f/u ov/ Jimenez/Kathryn Jimenez re: asthma/ 02 dep  maint on symbicort  80  did  bring meds  Chief Complaint  Patient presents with   Follow-up    Follow up   Dyspnea:  dry mouth is better / doing more walking since last ov  Cough: with insp or walking  Sleeping: recliner at lower angle s  resp cc > could not tol cpap in past and says last studies were neg for osa anyway  SABA use: twice a week avg 02: 2lpm at bedtime / at rest  and prn walking  Rec Make sure you check your oxygen  saturation  AT  your highest level of activity (not after you stop)   to be sure it stays over 90%   Try off symbicort  to see if your breathing when you walk is worse or your   need for rescue inhaler goes up in which case you should go back to Take 2 puffs first thing in am and then another 2 puffs about 12 hours later.   Allergy screen 08/08/23  >  Eos 0.1 /  IgE  54  alpha one MM  level 203  11/01/2023  f/u ov/Niwot Jimenez/Kathryn Jimenez re: cough variant asthma  maint on symbicort  1-2 bid   Chief Complaint  Patient presents with   Follow-up    6 week follow up   Dyspnea:  no sustained exercise  Cough: severe with certain smells or talking or laughing / using altoids Sleeping: recliner 30 degrees no resp cc until stir and starts throat clearing  SABA use: rarely  02: 2lpm sleeping/   leaves off at rest with sats  ok / needs 3-4 lpm continuous when  walking  Rec Gabapentin  100 mg each am for a week then twice daily for a week then three times daily for a week - hold the dose at whatever dose work. GERD diet reviewed, bed blocks rec      12/16/2023  f/u ov/[place] Jimenez/Kathryn Jimenez re: cough variant asthma vs UACS plus PF non specific c/w NSP  maint on symbicort   80 / gabapentin  100 mg 4 x daily  with cough slt improved but still constant daytime urge to clear throat for congestion which is dry  Chief Complaint  Patient presents with   Shortness of Breath  Dyspnea:  mb x 2007 x 75 ft  flat on 3lpm 87%sats - using rollator at [employer] >using to sit cause back gives out  Cough: ? Some better from change altoid to [employer] and rx with gabapentin  Sleeping: recliner leaning back flatter now with less noct resp cc  SABA use: not needing  02: 2lpm hs up to 4lpm with activity Rec Make sure you check your oxygen  saturation  AT  your highest level of activity (not after you stop)   to be sure it stays over 90% Ok to try albuterol  15 min before an activity (on alternating days)  that you know would usually make you short of breath  Increase the gabapentin  to 300 mg twice daily if not satisfied with 500 mg per day  in terms of eliminating your throat clearing.  Allergy screen 12/16/23  >  Eos 0.2 /  IgE  37 > rec ent eval for pnds   Allergy Carlene Shaggy [id number]  > no change rx/ return for skin tesiting     03/16/2024  f/u ov/Kathryn Jimenez Jimenez/Kathryn Jimenez re: DOE/ NSIP/ chronic rhinitis    maint on ***  No chief complaint on file.   Dyspnea:  *** Cough: *** Sleeping: ***   resp cc  SABA use: *** 02: ***  Lung cancer screening: ***   No obvious day to day or daytime variability or assoc excess/ purulent sputum or mucus plugs or hemoptysis or cp or chest tightness, subjective wheeze or overt sinus or hb symptoms.    Also denies any obvious fluctuation of symptoms with weather or environmental changes or other aggravating or alleviating factors except as outlined above   No unusual exposure hx or h/o childhood pna/ asthma or knowledge of premature birth.  Current Allergies, Complete Past Medical History, Past Surgical History, Family History, and Social History were reviewed in [employer] record.  ROS  The following are not active complaints unless bolded Hoarseness, sore throat, dysphagia, dental problems, itching, sneezing,  nasal congestion or discharge of excess mucus or purulent secretions, ear ache,   fever, chills, sweats, unintended wt loss or wt  gain, classically pleuritic or exertional cp,  orthopnea pnd or arm/hand swelling  or leg swelling, presyncope, palpitations, abdominal pain, anorexia, nausea, vomiting, diarrhea  or change in bowel habits or change in bladder habits, change in stools  or change in urine, dysuria, hematuria,  rash, arthralgias, visual complaints, headache, numbness, weakness or ataxia or problems with walking or coordination,  change in mood or  memory.        No outpatient medications have been marked as taking for the 03/16/24 encounter (Appointment) with Kathryn Ozell NOVAK, MD.               Past Medical History:  Diagnosis Date   Acute on chronic congestive heart failure (HCC) 03/16/2023   Allergy    Anginal pain (HCC)    Anxiety    Arthritis    Asthma    Bladder disorder, other    Borderline personality disorder (HCC)    CAD (coronary artery disease) 03/16/2023   Carotid artery disease (HCC) 03/05/2017   Depression    Diabetes mellitus without complication (HCC)    GERD (gastroesophageal reflux disease)    Headache    Hyperlipidemia    Hypertension    Kidney stones    Morbid obesity with BMI of 50.0-59.9, adult 01/12/2020   Mucous retention cyst of maxillary sinus 08/31/2021   Noted on head CT   Neuromuscular disorder (HCC)    Neuropathy   PTSD (post-traumatic stress disorder)    Shortness of breath    Sleep apnea    Stroke (HCC) 2018   Thyroid  disease    nodule       Objective:    Wts  03/16/2024        ***  12/16/2023       286   11/01/2023       280  09/20/23 283 lb 6.4 oz (128.5 kg)  09/05/23 284 lb 9.6 oz (129.1 kg)  08/08/23 279 lb (126.6 kg)    03/16/2024  f/u ov/Adelanto Jimenez/Kathryn Jimenez re: *** maint on ***  No chief complaint on file.   Dyspnea:  *** Cough: *** Sleeping: ***   resp cc  SABA use: *** 02: ***  Lung cancer screening: ***   No obvious day to day or daytime variability or assoc excess/ purulent sputum or mucus plugs or hemoptysis or cp or chest  tightness, subjective wheeze or overt sinus or hb symptoms.    Also denies any obvious fluctuation of symptoms with weather or environmental changes or other aggravating or alleviating factors except as outlined above   No unusual exposure hx or h/o childhood pna/ asthma or knowledge of premature birth.  Current Allergies, Complete Past Medical History, Past Surgical History, Family History, and Social History were reviewed in [employer] record.  ROS  The following are not active complaints unless bolded Hoarseness, sore throat, dysphagia, dental problems, itching, sneezing,  nasal congestion or discharge of excess mucus or purulent secretions, ear ache,   fever, chills, sweats, unintended wt loss or wt gain, classically pleuritic or exertional cp,  orthopnea pnd or arm/hand swelling  or leg swelling, presyncope, palpitations, abdominal pain, anorexia, nausea, vomiting, diarrhea  or change in bowel habits or change in bladder habits, change in stools or change in urine, dysuria, hematuria,  rash, arthralgias, visual complaints, headache, numbness, weakness or ataxia or problems with walking or coordination,  change in mood or  memory.        No outpatient medications have been marked as taking for the 03/16/24 encounter (Appointment) with Kathryn Ozell NOVAK, MD.        trace pitting both LEs       I personally reviewed images and agree with radiology impression as follows:  HRCT   10/31/23       Spectrum of findings compatible with fibrotic interstitial lung disease with an upper lobe predominance and relative sparing of the lower lobes. No honeycombing. Mild patchy air trapping in both lungs. Findings are most compatible with chronic hypersensitivity Pneumonitis  Assessment

## 2024-03-16 ENCOUNTER — Ambulatory Visit: Payer: Self-pay

## 2024-03-16 ENCOUNTER — Ambulatory Visit: Payer: MEDICAID | Admitting: Internal Medicine

## 2024-03-16 NOTE — Telephone Encounter (Signed)
Pt has rescheduled appt.

## 2024-03-16 NOTE — Telephone Encounter (Signed)
 During scheduling portion of the NT, pt states that she is to dizzy to do a VV at this time. Pt advised that given the severity of her dizziness that she should be evaluated in the ED. Pt states that she will get ready and go to the ED. FYI Only or Action Required?: FYI only for provider.  Patient was last seen in primary care on 01/14/2024 by Cathlene Marry Lenis, FNP.  Called Nurse Triage reporting Facial Pain.  Symptoms began a week ago.  Interventions attempted: Prescription medications: zpak, fioricet, .  Symptoms are: gradually worsening.  Triage Disposition: See Physician Within 24 Hours  Patient/caregiver understands and will follow disposition?:  Copied from CRM [id number]. Topic: Clinical - Red Word Triage >> Mar 16, 2024 10:21 AM Laurier C wrote: Red Word that prompted transfer to Nurse Triage: Patient is on oxygen  and is currently having an ear ache, body aches, mouth and is still in pain (painm level 5-6). Patient says the meds are making her dizzy and not really helping. Reason for Disposition  Earache  Answer Assessment - Initial Assessment Questions 1. LOCATION: Where does it hurt?      L side of face 2. ONSET: When did the sinus pain start?  (e.g., hours, days)      About a week 3. SEVERITY: How bad is the pain?   (Scale 0-10; or none, mild, moderate or severe)     moderate 4. RECURRENT SYMPTOM: Have you ever had sinus problems before? If Yes, ask: When was the last time? and What happened that time?      yes 7. FEVER: Do you have a fever? If Yes, ask: What is it, how was it measured, and when did it start?      denies 8. OTHER SYMPTOMS: Do you have any other symptoms? (e.g., sore throat, cough, earache, difficulty breathing)     Cough, earache, dizzy, body aches  Protocols used: Sinus Pain or Congestion-A-AH

## 2024-03-16 NOTE — Telephone Encounter (Signed)
Noted  -LS

## 2024-03-16 NOTE — Telephone Encounter (Signed)
 Addressed by RN triage this morning already.

## 2024-03-17 ENCOUNTER — Telehealth: Payer: Self-pay | Admitting: Allergy & Immunology

## 2024-03-17 NOTE — Telephone Encounter (Signed)
 Pt request a call back about her appt on Friday.

## 2024-03-20 ENCOUNTER — Ambulatory Visit: Payer: MEDICAID | Admitting: Allergy & Immunology

## 2024-03-23 ENCOUNTER — Ambulatory Visit: Payer: Self-pay

## 2024-03-23 NOTE — Telephone Encounter (Signed)
 Pt has appt

## 2024-03-23 NOTE — Telephone Encounter (Signed)
 FYI Only or Action Required?: Action required by provider: request for appointment.  Patient was last seen in primary care on 01/14/2024 by Cathlene Marry Lenis, FNP.  Called Nurse Triage reporting Otalgia.  Symptoms began several weeks ago.  Interventions attempted: Rest, hydration, or home remedies.  Symptoms are: gradually improving.  Triage Disposition: See Physician Within 24 Hours  Patient/caregiver understands and will follow disposition?: YesCopied from CRM #[id number]. Topic: Clinical - Red Word Triage >> Mar 23, 2024  2:19 PM Elle L wrote: Red Word that prompted transfer to Nurse Triage: The patient went to the Emergency Room last Monday and the Monday after last. She is experiencing worsening pain in her ear from an ear infection. Reason for Disposition  Earache  (Exceptions: Brief ear pain of lasting less than 60 minutes, or earache occurring during air travel.)  Answer Assessment - Initial Assessment Questions Pt has been seen at ED for earache, sinus infection 2 weeks ago. Pt was given z-pack. Pt went back to ED the following Monday for vertigo. Pain and sinus issues are better but Pt has noticed the ear popping eating and if water  gets in  ears it is painful. Pt asked for Thursday appt due to car trouble.     1. LOCATION: Which ear is involved?     both 2. ONSET: When did the ear pain start?      2 weeks ago  3. SEVERITY: How bad is the pain?  (Scale 1-10; mild, moderate or severe)     Irritating-1-2 4. URI SYMPTOMS: Do you have a runny nose or cough?     Allergies-yes 5. FEVER: Do you have a fever? If Yes, ask: What is your temperature, how was it measured, and when did it start?     Not sure 6. CAUSE: Have you been swimming recently?, How often do you use Q-TIPS?, Have you had any recent air travel or scuba diving?     na 7. OTHER SYMPTOMS: Do you have any other symptoms? (e.g., decreased hearing, dizziness, headache, stiff neck, vomiting)      Sinus infection, headache  Protocols used: Earache-A-AH

## 2024-03-24 ENCOUNTER — Other Ambulatory Visit: Payer: Self-pay | Admitting: Internal Medicine

## 2024-03-25 NOTE — Progress Notes (Unsigned)
   Acute Office Visit  Subjective:     Patient ID: Kathryn Jimenez, female    DOB: 1971/05/12, 53 y.o.   MRN: [id number]  No chief complaint on file.   HPI Kathryn Jimenez Otitis Externa: Patient presents with {otitis externa sxs:13539}.  Symptoms have been present for {0-10:33138} {time units:11}.  During that time there {has/has not:15037} been pus draining out of the. Previous treatment: {otitis externa treatment:13538}. There {has/has not:15037} been some improvement with current treatment.  There {has/has not:15037} been a recent history of swimming.  ROS Negative unless indicated in HPI    Objective:    There were no vitals taken for this visit. {Vitals History (Optional):23777}  Physical Exam Pertinent labs & imaging results that were available during my care of the patient were reviewed by me and considered in my medical decision making.  No results found for any visits on 03/26/24.      Assessment & Plan:  There are no diagnoses linked to this encounter. Continue healthy lifestyle choices, including diet (rich in fruits, vegetables, and lean proteins, and low in salt and simple carbohydrates) and exercise (at least 30 minutes of moderate physical activity daily).     The above assessment and management plan was discussed with the patient. The patient verbalized understanding of and has agreed to the management plan. Patient is aware to call the clinic if they develop any new symptoms or if symptoms persist or worsen. Patient is aware when to return to the clinic for a follow-up visit. Patient educated on when it is appropriate to go to the emergency department.  No follow-ups on file.  Kathryn Whittier [place] Thompson, DNP [facility] [address] [place], [place] [address] [phone]  Note: This document was prepared by [employer] voice dictation technology and any errors that results from this process are unintentional.

## 2024-03-26 ENCOUNTER — Ambulatory Visit: Payer: MEDICAID | Admitting: Nurse Practitioner

## 2024-03-26 ENCOUNTER — Encounter: Payer: Self-pay | Admitting: Allergy & Immunology

## 2024-03-26 VITALS — BP 126/73 | HR 85 | Temp 97.2°F | Ht 64.0 in | Wt 296.8 lb

## 2024-03-26 DIAGNOSIS — H60313 Diffuse otitis externa, bilateral: Secondary | ICD-10-CM | POA: Diagnosis not present

## 2024-03-26 MED ORDER — CIPROFLOXACIN-DEXAMETHASONE 0.3-0.1 % OT SUSP
4.0000 [drp] | Freq: Two times a day (BID) | OTIC | 0 refills | Status: AC
Start: 2024-03-26 — End: 2024-04-02

## 2024-03-27 ENCOUNTER — Encounter: Payer: Self-pay | Admitting: Allergy & Immunology

## 2024-04-02 ENCOUNTER — Encounter: Payer: Self-pay | Admitting: Allergy & Immunology

## 2024-04-02 ENCOUNTER — Ambulatory Visit: Payer: Self-pay

## 2024-04-02 NOTE — Telephone Encounter (Signed)
 FYI Only or Action Required?: Action required by provider: requesting Rx.  Patient was last seen in primary care on 03/26/2024 by Deitra Morton Sebastian Nena, NP.  Called Nurse Triage reporting Otitis Media.  Symptoms began several weeks ago.  Interventions attempted: Prescription medications: Ciprodex  otic drops without relief.  Symptoms are: gradually worsening.  Triage Disposition: See Physician Within 24 Hours  Patient/caregiver understands and will follow disposition?: No, wishes to speak with PCP      Copied from CRM #[id number]. Topic: Clinical - Medication Question >> Apr 02, 2024  2:20 PM Geneva B wrote: Reason for CRM: patient is calling in she has an ear infection and was prescribed an rx but she is having low grade fevers and this is her last day of the rx but the ear infection is still there and she wants to know what are her options going forward please call patient back [phone] Reason for Disposition  [1] Taking antibiotic > 72 hours (3 days) and [2] pain persists or recurs  Answer Assessment - Initial Assessment Questions 1. ANTIBIOTIC: What antibiotic are you taking? How many times per day?     ciprofloxacin -dexamethasone  (CIPRODEX ) Received z-pack prior and recently finished 2. ONSET: When was the antibiotic started?     Last week 3. LOCATION: Which ear is involved?     Bilateral, L > R 4. PAIN: How bad is the pain?   (Scale 0-10; none, mild, moderate or severe)     Nagging pain 5. FEVER: Do you have a fever? If Yes, ask: What is your temperature, how was it measured, and when did it start?     Low grade 6. DISCHARGE: Is there any discharge? If Yes, ask: What color is it? (e.g., clear, white; yellow, green; bloody)     denies 7. OTHER SYMPTOMS: Do you have any other symptoms? (e.g., headache, stiff neck, dizziness, vomiting, runny nose)     Stiff neck, vertigo - endorsed going to ED (see chart) 8. PREGNANCY: Is there any chance you are  pregnant? When was your last menstrual period?     N/a    Pt reports recently saw PCP, and sx are not improving with ear drops prescribed. [facility] offered to schedule AV, but pt declined d/t transportation issues and recent visit last week. Pt requesting additional guidance from PCP. [facility] will forward encounter for Cathlene, FNP 's office to review and advise. Patient verbalized understanding and is expecting call back from office for next steps.  Protocols used: Ear - Otitis Media Follow-up Call-A-AH

## 2024-04-03 ENCOUNTER — Encounter: Payer: Self-pay | Admitting: Nurse Practitioner

## 2024-04-03 ENCOUNTER — Ambulatory Visit: Payer: MEDICAID | Admitting: Allergy & Immunology

## 2024-04-03 NOTE — Telephone Encounter (Signed)
 Both ears causing discomfort but mostly the left.  Took last dose of 7 day course last night. ATB drops and oral ATB given by urgent care not really helpful (azithromycin ).  Fever 99.2 last night.  Pt unable to come today or Monday due to other appts. Offered virtual but she has problems every time.

## 2024-04-03 NOTE — Telephone Encounter (Signed)
 If pain is worsening despite treatment, she needs to be reevaluated in person.  If she cannot come here then she can go to urgent care.

## 2024-04-03 NOTE — Telephone Encounter (Signed)
 Spoke with pt. States that she is exhausted and cannot come in today or go anywhere else. She is aware to go to ER/urgent care over the weekend if she becomes worse.

## 2024-04-06 ENCOUNTER — Ambulatory Visit: Payer: MEDICAID | Admitting: Nurse Practitioner

## 2024-04-06 DIAGNOSIS — E119 Type 2 diabetes mellitus without complications: Secondary | ICD-10-CM

## 2024-04-06 DIAGNOSIS — I10 Essential (primary) hypertension: Secondary | ICD-10-CM

## 2024-04-06 DIAGNOSIS — E782 Mixed hyperlipidemia: Secondary | ICD-10-CM

## 2024-04-06 DIAGNOSIS — Z794 Long term (current) use of insulin: Secondary | ICD-10-CM

## 2024-04-06 NOTE — Telephone Encounter (Signed)
 Patient needs to cancel her appt for today

## 2024-04-15 ENCOUNTER — Encounter: Payer: Self-pay | Admitting: Family Medicine

## 2024-04-15 ENCOUNTER — Ambulatory Visit: Payer: MEDICAID | Admitting: Family Medicine

## 2024-04-15 VITALS — BP 106/64 | HR 85 | Temp 97.8°F | Ht 64.0 in | Wt 291.6 lb

## 2024-04-15 DIAGNOSIS — E1169 Type 2 diabetes mellitus with other specified complication: Secondary | ICD-10-CM

## 2024-04-15 DIAGNOSIS — J45991 Cough variant asthma: Secondary | ICD-10-CM

## 2024-04-15 DIAGNOSIS — I5032 Chronic diastolic (congestive) heart failure: Secondary | ICD-10-CM | POA: Diagnosis not present

## 2024-04-15 DIAGNOSIS — H65193 Other acute nonsuppurative otitis media, bilateral: Secondary | ICD-10-CM

## 2024-04-15 DIAGNOSIS — E1159 Type 2 diabetes mellitus with other circulatory complications: Secondary | ICD-10-CM

## 2024-04-15 DIAGNOSIS — J0140 Acute pansinusitis, unspecified: Secondary | ICD-10-CM

## 2024-04-15 DIAGNOSIS — I700 Atherosclerosis of aorta: Secondary | ICD-10-CM

## 2024-04-15 DIAGNOSIS — Z794 Long term (current) use of insulin: Secondary | ICD-10-CM

## 2024-04-15 DIAGNOSIS — Z8673 Personal history of transient ischemic attack (TIA), and cerebral infarction without residual deficits: Secondary | ICD-10-CM

## 2024-04-15 DIAGNOSIS — E1142 Type 2 diabetes mellitus with diabetic polyneuropathy: Secondary | ICD-10-CM | POA: Diagnosis not present

## 2024-04-15 DIAGNOSIS — F339 Major depressive disorder, recurrent, unspecified: Secondary | ICD-10-CM

## 2024-04-15 DIAGNOSIS — J9611 Chronic respiratory failure with hypoxia: Secondary | ICD-10-CM

## 2024-04-15 MED ORDER — MONTELUKAST SODIUM 10 MG PO TABS: ORAL_TABLET | ORAL | 2 refills | Status: DC

## 2024-04-15 MED ORDER — FLUCONAZOLE 150 MG PO TABS: ORAL_TABLET | ORAL | 4 refills | Status: AC

## 2024-04-15 MED ORDER — CEFDINIR 300 MG PO CAPS: 300.0000 mg | ORAL_CAPSULE | Freq: Two times a day (BID) | ORAL | 0 refills | Status: AC

## 2024-04-15 NOTE — Progress Notes (Signed)
 Established Patient Office Visit  Subjective   Patient ID: Kathryn Jimenez, female    DOB: 03/30/1971  Age: 53 y.o. MRN: [id number]  Chief Complaint  Patient presents with   Medical Management of Chronic Issues    HPI  History of Present Illness   Marleigh Roberson is a 53 year old female with chronic sinusitis and otitis media who presents with ear and sinus problems.  Otolaryngologic symptoms - Recurrent sinusitis and otitis media with symptoms since July - Initial presentation included pain and pressure on the left side of the face - Visited ER on July 21st for sinus and ear infection; azithromycin  prescribed without improvement - Subsequently treated with antibiotic ear drops and doxycycline , recently completed - Persistent pressure along the jaw, occasional itching, and pain in the left ear - Improved hearing in the left ear - Persistent headaches localized around the eyebrows and ears - No current fever, but fevers occurred in recent weeks - No sore throat - Intermittent nasal congestion  Glycemic control - Type 2 diabetes mellitus - Recent blood glucose level 140-180 mg/dL - Uses both long-acting and short-acting insulin  - Last hemoglobin A1c in May was 8.3% - Managed by endocrinology - Insulin  dependent  Hyperlipidemia - Hypercholesterolemia managed without medication due to intolerance - Last LDL cholesterol was 107 mg/dL - Declines other medication for cholesterol  Hypertension and heart failure - Hypertension previously managed with metoprolol  and losartan , both discontinued due to side effects - Currently takes torsemide  for chronic heart failure  Cerebrovascular disease - History of stroke in 2018 - Takes Plavix  daily  Respiratory symptoms and asthma - Oxygen -dependent due to chronic respiratory issues - Cough variant asthma managed with Symbicort  and Singulair  - No recent need for rescue inhaler - Awaiting allergy testing and immune testing - Established  with pulmonology   Weight - Has lost over 100 lbs - Reports diet is pretty good - No exercise         04/15/2024   10:45 AM 01/14/2024    9:42 AM 10/15/2023   10:36 AM  Depression screen PHQ 2/9  Decreased Interest 1 1 1   Down, Depressed, Hopeless 1 1 1   PHQ - 2 Score 2 2 2   Altered sleeping 1 1 1   Tired, decreased energy 1 1 1   Change in appetite 1 1 1   Feeling bad or failure about yourself  1 1 1   Trouble concentrating 1 1 1   Moving slowly or fidgety/restless 1 1 1   Suicidal thoughts 0 0 0  PHQ-9 Score 8 8 8   Difficult doing work/chores Somewhat difficult Somewhat difficult Somewhat difficult      04/15/2024   10:46 AM 01/14/2024    9:42 AM 10/15/2023   10:36 AM 05/16/2023   10:24 AM  GAD 7 : Generalized Anxiety Score  Nervous, Anxious, on Edge 1 1 1 1   Control/stop worrying 1 1 1 1   Worry too much - different things 1 1 1 1   Trouble relaxing 1 1 1 1   Restless 1 1 0 1  Easily annoyed or irritable 2 1 1 1   Afraid - awful might happen 1 1 1 1   Total GAD 7 Score 8 7 6 7   Anxiety Difficulty Very difficult Somewhat difficult  Somewhat difficult        ROS As per HPI.    Objective:     BP 106/64   Pulse 85   Temp 97.8 F (36.6 C) (Temporal)   Ht 5' 4 (1.626 m)  Wt 291 lb 9.6 oz (132.3 kg)   SpO2 96%   BMI 50.05 kg/m  Wt Readings from Last 3 Encounters:  04/15/24 291 lb 9.6 oz (132.3 kg)  03/26/24 296 lb 12.8 oz (134.6 kg)  03/02/24 296 lb (134.3 kg)      Physical Exam Vitals and nursing note reviewed.  Constitutional:      General: She is not in acute distress.    Appearance: She is obese. She is not ill-appearing, toxic-appearing or diaphoretic.  HENT:     Right Ear: Ear canal and external ear normal. A middle ear effusion (mucoid) is present. Tympanic membrane is not perforated, erythematous or bulging.     Left Ear: Ear canal and external ear normal. A middle ear effusion (mucoid) is present. Tympanic membrane is erythematous and bulging.  Tympanic membrane is not perforated.     Nose: Congestion present.     Right Sinus: Maxillary sinus tenderness and frontal sinus tenderness present.     Left Sinus: Maxillary sinus tenderness and frontal sinus tenderness present.     Mouth/Throat:     Mouth: Mucous membranes are moist.     Pharynx: Oropharynx is clear. No oropharyngeal exudate or posterior oropharyngeal erythema.  Eyes:     General:        Right eye: No discharge.        Left eye: No discharge.  Cardiovascular:     Rate and Rhythm: Normal rate and regular rhythm.     Heart sounds: Normal heart sounds. No murmur heard. Pulmonary:     Effort: Pulmonary effort is normal. No respiratory distress.     Breath sounds: Normal breath sounds. No wheezing, rhonchi or rales.  Abdominal:     General: Bowel sounds are normal. There is no distension.     Palpations: Abdomen is soft.     Tenderness: There is no abdominal tenderness. There is no right CVA tenderness, left CVA tenderness, guarding or rebound.  Skin:    General: Skin is warm and dry.  Neurological:     Mental Status: She is alert and oriented to person, place, and time. Mental status is at baseline.  Psychiatric:        Mood and Affect: Mood normal.        Behavior: Behavior normal.      No results found for any visits on 04/15/24.    The ASCVD Risk score (Arnett DK, et al., 2019) failed to calculate for the following reasons:   Risk score cannot be calculated because patient has a medical history suggesting prior/existing ASCVD    Assessment & Plan:   Type 2 diabetes mellitus with diabetic polyneuropathy, with long-term current use of insulin  (HCC)  Hyperlipidemia associated with type 2 diabetes mellitus (HCC)  Hypertension associated with type 2 diabetes mellitus (HCC)  Chronic heart failure with preserved ejection fraction (HFpEF) (HCC)  Morbid obesity (HCC)  Aortic atherosclerosis (HCC)  History of CVA (cerebrovascular accident)  Cough  variant asthma withcomponent UACS  Chronic hypoxic respiratory failure (HCC) -     Montelukast  Sodium; take 1 tablet (10 milligram total) by mouth at bedtime.  Dispense: 30 tablet; Refill: 2  Acute mucoid otitis media of both ears -     Cefdinir ; Take 1 capsule (300 mg total) by mouth 2 (two) times daily for 10 days. 1 po BID  Dispense: 20 capsule; Refill: 0 -     Fluconazole ; Take one pill every 3 days  Dispense: 3 tablet; Refill: 4  Acute  non-recurrent pansinusitis -     Cefdinir ; Take 1 capsule (300 mg total) by mouth 2 (two) times daily for 10 days. 1 po BID  Dispense: 20 capsule; Refill: 0 -     Fluconazole ; Take one pill every 3 days  Dispense: 3 tablet; Refill: 4  Chronic respiratory failure with hypoxia and hypercapnia (HCC)  Depression, recurrent (HCC)  Assessment and Plan    Chronic sinusitis, left-sided predominant and left otitis media with effusion Persistent sinus pressure and jaw pain with intermittent ear symptoms. Recent doxycycline  provided partial relief. Fluid in left ear and sinus tenderness noted. Allergy testing pending. - Prescribed cefdinir  300 mg twice daily for 10 days. - Prescribed Diflucan  with extra pills for potential yeast infection.  Type 2 diabetes mellitus Recent A1c 8.3%.  - Follow up with Endocrinology for management.   Chronic heart failure Well-managed with torsemide . Euvolemic on exam - follow up with cardiology.  Hypertension Self discontinued metoprolol  and losartan  due to side effects. Blood pressure remains controlled.  Hyperlipidemia Intolerant to statins and Zetia . LDL at 107 mg/dL. Declines other treatment options.   Oxygen -dependent chronic respiratory failure and cough variant asthma Using Symbicort , Singulair , and minimal rescue inhaler. Gabapentin  daily for cough. - Continue follow up with allergy and pulmonology  Depression and anxiety disorder Borderline personality disorder, obsessive-compulsive disorder, panic disorder,  and post-traumatic stress disorder Managed with Wellbutrin , Prozac , and Ativan . Depression stable, but agitation and irritability persist. - Continue follow up with [facility] for management      Total time spent caring for the patient today was 47 minutes. This includes time spent before the visit reviewing the chart, time spent during the visit, and time spent after the visit on documentation.  Return in about 6 months (around 10/16/2024) for CPE.   The patient indicates understanding of these issues and agrees with the plan.  Annabella CHRISTELLA Search, FNP

## 2024-04-22 ENCOUNTER — Encounter: Payer: Self-pay | Admitting: Nurse Practitioner

## 2024-04-22 ENCOUNTER — Encounter: Payer: Self-pay | Admitting: Allergy & Immunology

## 2024-04-22 MED ORDER — INSULIN LISPRO (1 UNIT DIAL) 100 UNIT/ML (KWIKPEN): 14.0000 [IU] | PEN_INJECTOR | Freq: Three times a day (TID) | SUBCUTANEOUS | 1 refills | Status: AC

## 2024-04-30 ENCOUNTER — Ambulatory Visit: Payer: MEDICAID | Admitting: Internal Medicine

## 2024-04-30 ENCOUNTER — Encounter (INDEPENDENT_AMBULATORY_CARE_PROVIDER_SITE_OTHER): Payer: Self-pay | Admitting: *Deleted

## 2024-05-03 NOTE — Progress Notes (Unsigned)
 Kathryn Jimenez, female    DOB: 1970/09/04    MRN: [id number]   Brief patient profile:  69 yowf never smoker grew up around smokers with lots of resp infections / stuffy head and bronchitis dx as asthma age 45s   referred to pulmonary clinic in [place]  08/08/2023   for cough x sept 2023  and 02 dep after pna  02/2023      History of Present Illness  08/08/2023  Pulmonary/ 1st office eval/ Travoris Bushey / [place] [facility] symbicort  160 / incruse/singulair  > very dry mouth and nose (not on humidified 02)  Chief Complaint  Patient presents with   Establish Care   Shortness of Breath    On O2/[employer], 4-6L w/ambulation, 2L at rest  Dyspnea:  only goes out to go to doctor's office last walked at [facility]  2lpm at rest and needed up to 6opm  walking  Cough: varies sometimes wakes her up non productive and quite violent assoc with urinary incont and midline cp but no gagging Sleep: 45 degrees in recliner  SABA use: hfa couple times a week / no neb  02: sept 2023 p hosp > restarted back on it sev months prior to OV  p admit with dx cap/ chf. Rec Add water  to your oxygen  equipment  Pantoprazole  (protonix ) 40 mg   Take  30-60 min before first meal of the day and Pepcid  (famotidine )  20 mg after supper until return to office - this is the best way to tell whether stomach acid is contributing to your problem.   Use sugarless jolly ranchers to suppress urge to cough  Plan A = Automatic = Always=    Symbicort  80 1-2 puffs every 12 hours (take one if coughing and 2 if breathing worse but goal is to use the lower dose if possible)  Plan B  = Backup (to supplement plan A, not to replace it) Only use your albuterol  inhaler as a rescue medication Depomedrol 120 mg IM today  We will walk you today to establish new 0xygen needs  Please schedule a follow up office visit in 6 weeks, call sooner if needed with all medications /inhalers/ solutions in hand     Allergy screen 08/08/23  >  Eos 0.1 /  IgE   54    09/20/2023  f/u ov/[place] office/Lizmary Nader re: asthma/ 02 dep  maint on symbicort  80  did  bring meds  Chief Complaint  Patient presents with   Follow-up    Follow up   Dyspnea:  dry mouth is better / doing more walking since last ov  Cough: with insp or walking  Sleeping: recliner at lower angle s  resp cc > could not tol cpap in past and says last studies were neg for osa anyway  SABA use: twice a week avg 02: 2lpm at bedtime / at rest  and prn walking  Rec Make sure you check your oxygen  saturation  AT  your highest level of activity (not after you stop)   to be sure it stays over 90%   Try off symbicort  to see if your breathing when you walk is worse or your  need for rescue inhaler goes up in which case you should go back to Take 2 puffs first thing in am and then another 2 puffs about 12 hours later.     11/01/2023  f/u ov/[place] office/Demarco Bacci re: cough variant asthma  maint on symbicort  1-2 bid   Chief Complaint  Patient presents with  Follow-up    6 week follow up   Dyspnea:  no sustained exercise  Cough: severe with certain smells or talking or laughing / using altoids Sleeping: recliner 30 degrees no resp cc until stir and starts throat clearing  SABA use: rarely  02: 2lpm sleeping/   leaves off at rest with sats  ok / needs 3-4 lpm continuous when  walking  Rec Gabapentin  100 mg each am for a week then twice daily for a week then three times daily for a week - hold the dose at whatever dose work. GERD diet reviewed, bed blocks rec      12/16/2023  f/u ov/[place] office/Elliotte Marsalis re: cough variant asthma vs UACS plus PF non specific c/w NSP  maint on symbicort   80 / gabapentin  100 mg 4 x daily  with cough slt improved but still constant daytime urge to clear throat for congestion which is dry  Chief Complaint  Patient presents with   Shortness of Breath  Dyspnea:  mb x 2007 x 75 ft flat on 3lpm 87%sats - using rollator at [employer] >using to sit cause back gives out   Cough: ? Some better from change altoid to [employer] and rx with gabapentin  Sleeping: recliner leaning back flatter now with less noct resp cc  SABA use: not needing  02: 2lpm hs up to 4lpm with activity Rec Please remember to go to the lab department   for your tests - we will call you with the results when they are available. Make sure you check your oxygen  saturation  AT  your highest level of activity (not after you stop)   to be sure it stays over 90%  Ok to try albuterol  15 min before an activity (on alternating days)  that you know would usually make you short of breath  Increase the gabapentin  to 300 mg twice daily if not satisfied with 500 mg per day  in terms of eliminating your throat clearing.  My office will be contacting you by phone for referral to allergy  > pos dust   05/04/2024  f/u ov/[place] office/Cameryn Schum re: cough variant asthma vs UACS plus PF/ 02 dep  maint on 200 mg bid  gabapentin  and symbicort  80 2bid  Chief Complaint  Patient presents with   Shortness of Breath    Cough - doing better   Dyspnea:  improving and some time /carrying groceries/ mailbox and back better and not better  Cough: tint of light yellow  Sleeping: back is flat/ cx pillow   resp cc  SABA use: rarely  02: prn    No obvious day to day or daytime variability or assoc excess/ purulent sputum or mucus plugs or hemoptysis or cp or chest tightness, subjective wheeze or overt sinus or hb symptoms.    Also denies any obvious fluctuation of symptoms with weather or environmental changes or other aggravating or alleviating factors except as outlined above   No unusual exposure hx or h/o childhood pna/ asthma or knowledge of premature birth.  Current Allergies, Complete Past Medical History, Past Surgical History, Family History, and Social History were reviewed in [employer] record.  ROS  The following are not active complaints unless bolded Hoarseness, sore throat,  dysphagia, dental problems, itching, sneezing,  nasal congestion or discharge of excess mucus or purulent secretions, ear ache,   fever, chills, sweats, unintended wt loss or wt gain, classically pleuritic or exertional cp,  orthopnea pnd or arm/hand swelling  or leg  swelling, presyncope, palpitations, abdominal pain, anorexia, nausea, vomiting, diarrhea  or change in bowel habits or change in bladder habits, change in stools or change in urine, dysuria, hematuria,  rash, arthralgias, visual complaints, headache, numbness, weakness or ataxia or problems with walking or coordination,  change in mood or  memory.        Current Meds  Medication Sig   Accu-Chek Softclix Lancets lancets Use to check blood glucose four times daily as instructed   acetaminophen  (TYLENOL ) 500 MG tablet Take 500 mg by mouth every 6 (six) hours as needed for mild pain or moderate pain.   albuterol  (VENTOLIN  HFA) 108 (90 Base) MCG/ACT inhaler inhale two puffs into the lungs every 6 (six) hours as needed for wheezing.   budesonide -formoterol  (SYMBICORT ) 80-4.5 MCG/ACT inhaler Take 2 puffs first thing in am and then another 2 puffs about 12 hours later.   buPROPion  (WELLBUTRIN  XL) 150 MG 24 hr tablet Take 150 mg by mouth daily.   cefdinir  (OMNICEF ) 300 MG capsule Take 1 capsule (300 mg total) by mouth 2 (two) times daily for 10 days. 1 po BID   clopidogrel  (PLAVIX ) 75 MG tablet Take 1 tablet (75 mg total) by mouth daily.   clotrimazole -betamethasone  (LOTRISONE ) cream Apply thin layer to affected external area up to twice daily as needed   Continuous Glucose Sensor (DEXCOM G7 SENSOR) MISC Inject 1 Application into the skin as directed. Change sensor every 10 days as directed.   cycloSPORINE  (RESTASIS ) 0.05 % ophthalmic emulsion Place 1 drop into both eyes 2 (two) times daily.   diphenhydrAMINE (BENADRYL) 25 mg capsule Take 25 mg by mouth every 6 (six) hours as needed for allergies.   fluconazole  (DIFLUCAN ) 150 MG tablet Take one  pill every 3 days   FLUoxetine  (PROZAC ) 40 MG capsule Take 40 mg by mouth daily.   gabapentin  (NEURONTIN ) 300 MG capsule take 1 capsule (300 MILLIGRAM total) by mouth 2 (two) times daily.   glucose blood (ACCU-CHEK GUIDE TEST) test strip Use to check blood glucose four times daily as instructed   insulin  glargine (LANTUS  SOLOSTAR) 100 UNIT/ML Solostar Pen Inject 35 Units into the skin at bedtime.   insulin  lispro (HUMALOG  KWIKPEN) 100 UNIT/ML KwikPen Inject 14-20 Units into the skin 3 (three) times daily.   Insulin  Pen Needle (PEN NEEDLES) 31G X 6 MM MISC Use to inject insulin  4 times daily   levonorgestrel  (LILETTA , 52 MG,) 20.1 MCG/DAY IUD 1 each by Intrauterine route once. Inserted 04/19/21   LORazepam  (ATIVAN ) 0.5 MG tablet Take 0.25-0.5 mg by mouth daily as needed for anxiety.   Magnesium  Glycinate 100 MG CAPS    montelukast  (SINGULAIR ) 10 MG tablet take 1 tablet (10 milligram total) by mouth at bedtime.   NON FORMULARY Vit d3 with K2 taking once a day   nystatin  (MYCOSTATIN /NYSTOP ) powder Apply 1 application  topically daily as needed (for fungal infections).   OVER THE COUNTER MEDICATION Patient states that she is taking Vitamin K2+D 3 - patient states that she takes 2 a day.   OXYGEN  Inhale 4 L/min into the lungs continuous.   potassium chloride  (KLOR-CON ) 10 MEQ tablet Take 10 mEq by mouth 2 (two) times daily.   torsemide  (DEMADEX ) 20 MG tablet Take 1-2 tablets (20-40 mg total) by mouth daily. Can take extra tablet with weight gain greater than 3 lb in 24 hours   valACYclovir  (VALTREX ) 500 MG tablet Take 1 tablet (500 mg total) by mouth daily.  Past Medical History:  Diagnosis Date   Acute on chronic congestive heart failure (HCC) 03/16/2023   Allergy    Anginal pain (HCC)    Anxiety    Arthritis    Asthma    Bladder disorder, other    Borderline personality disorder (HCC)    CAD (coronary artery disease) 03/16/2023   Carotid artery disease (HCC) 03/05/2017    Depression    Diabetes mellitus without complication (HCC)    GERD (gastroesophageal reflux disease)    Headache    Hyperlipidemia    Hypertension    Kidney stones    Morbid obesity with BMI of 50.0-59.9, adult 01/12/2020   Mucous retention cyst of maxillary sinus 08/31/2021   Noted on head CT   Neuromuscular disorder (HCC)    Neuropathy   PTSD (post-traumatic stress disorder)    Shortness of breath    Sleep apnea    Stroke (HCC) 2018   Thyroid  disease    nodule       Objective:    Wts  05/04/2024        ***  12/16/2023       286   11/01/2023       280  09/20/23 283 lb 6.4 oz (128.5 kg)  09/05/23 284 lb 9.6 oz (129.1 kg)  08/08/23 279 lb (126.6 kg)    Vital signs reviewed  05/04/2024  - Note at rest 02 sats  ***% on ***   General appearance:    MO (by BMI) amb wf clearly frustrated with her care with multiple ov's directed at rhinitis/ sinusitis/earaches all no better     Lungs are clear        Assessment

## 2024-05-04 ENCOUNTER — Encounter: Payer: Self-pay | Admitting: Internal Medicine

## 2024-05-04 ENCOUNTER — Ambulatory Visit: Payer: MEDICAID | Admitting: Internal Medicine

## 2024-05-04 VITALS — BP 139/74 | HR 85 | Ht 64.0 in | Wt 296.8 lb

## 2024-05-04 DIAGNOSIS — R0609 Other forms of dyspnea: Secondary | ICD-10-CM

## 2024-05-04 DIAGNOSIS — J9611 Chronic respiratory failure with hypoxia: Secondary | ICD-10-CM

## 2024-05-04 DIAGNOSIS — J9612 Chronic respiratory failure with hypercapnia: Secondary | ICD-10-CM

## 2024-05-04 DIAGNOSIS — Z6841 Body Mass Index (BMI) 40.0 and over, adult: Secondary | ICD-10-CM

## 2024-05-04 DIAGNOSIS — R059 Cough, unspecified: Secondary | ICD-10-CM

## 2024-05-04 DIAGNOSIS — J45991 Cough variant asthma: Secondary | ICD-10-CM | POA: Diagnosis not present

## 2024-05-04 MED ORDER — METHYLPREDNISOLONE ACETATE 80 MG/ML IJ SUSP
120.0000 mg | Freq: Once | INTRAMUSCULAR | Status: AC
  Administered 2024-05-04: 120 mg via INTRAMUSCULAR

## 2024-05-04 NOTE — Patient Instructions (Addendum)
 My office will be contacting you by phone for referral to [facility]   - if you don't hear back from my office within one week please call us  back or notify us  thru MyChart and we'll address it right away.   Make sure you check your oxygen  saturation  AT  your highest level of activity (not after you stop)   to be sure it stays over 90% and adjust  02 flow upward to maintain this level if needed but remember to turn it back to previous settings when you stop (to conserve your supply).    Please schedule a follow up visit in 3 months but call sooner if needed

## 2024-05-06 NOTE — Assessment & Plan Note (Addendum)
 02 dep since pna/chf admit 02/2023  - HC03    07/23/23  = 33  - 08/08/2023   Walked on RA  with 93% initially but w/in 10 ft desat so placed on 4lpm x  150 ft slow pace and lowest sats 91%  - 09/20/2023   Walked on 4lpm   x  1  lap(s) =  approx 150  ft  @ slow pace, stopped due to sob with lowest 02 sats 90%   - HRCT  10/31/23 >>>  -  11/01/2023   Walked on 2lpm cont   x  2  lap(s) =  approx 300  ft  @ very slow  pace, stopped due to tired/ back pain  with lowest 02 sats 91%    Continues to be  02 dep  Rec:  titrate 02 sats to low 90s -  see avs for instructions unique to this ov

## 2024-05-06 NOTE — Assessment & Plan Note (Addendum)
 Body mass index is 50.95 kg/m.  -  trending up Lab Results  Component Value Date   TSH 2.630 01/14/2024    Contributing to doe and risk of GERD/dvt/ PE  >>>   reviewed the need and the process to achieve and maintain neg calorie balance > defer f/u primary care including intermittently monitoring thyroid  status         Each maintenance medication was reviewed in detail including emphasizing most importantly the difference between maintenance and prns and under what circumstances the prns are to be triggered using an action plan format where appropriate.   Total time for H and P, chart review, counseling, reviewing hfa/ 02 /pulse ox  device(s) and generating customized AVS unique to this office visit / same day charting = 33 min

## 2024-05-06 NOTE — Assessment & Plan Note (Addendum)
 MM/ Onset sept 2023 with neg response to rx for asthma - 08/08/2023  After extensive coaching inhaler device,  effectiveness =    75% from a baseline of 25 % with clear lung exam so try off incruse and decrease symbicort  to 80 2bid and max rx for gerd - Allergy screen 08/08/23  >  Eos 0.1 /  IgE  54  alpha one MM  level 203  - D /C mints 11/01/2023  -  Gabapentin  100 mg every day titrate to 100 mg qid over 4 weeks 11/01/2023 >>>  improved but still constant daytime throat clearing so titrate up to 300 mg bid  -  Allergy screen 12/16/23  >  Eos 0.2 /  IgE  37 > rec ent eval for pnds   -  05/04/2024  After extensive coaching inhaler device,  effectiveness =    75% > continue symbicort  80 2bid  All goals of chronic asthma control met including optimal function and elimination of symptoms with minimal need for rescue therapy.  Contingencies discussed in full including contacting this office immediately if not controlling the symptoms using the rule of two's.    For UACS component  >>> ENT referral for nasal / ear congestion p depomedrol 120 mg IM 05/04/2024 >>>

## 2024-05-11 NOTE — Telephone Encounter (Signed)
 She is active on MyChart. That might be a way to reach her?

## 2024-05-11 NOTE — Telephone Encounter (Signed)
 signed

## 2024-05-11 NOTE — Telephone Encounter (Signed)
 Thank you :)

## 2024-05-15 ENCOUNTER — Ambulatory Visit: Payer: Self-pay | Admitting: Internal Medicine

## 2024-05-15 NOTE — Telephone Encounter (Signed)
 Patient reports mild to moderate lower back pain since her steroid injection in office on 05/04/2024. Patient states pain started within a couple of hours of injection. Patient endorses pain level of 2 out of 10-states pain is 5 out of 10 in lower back when trying to sleep. Did discuss with patient being evaluated at PCP office today but she declined. Patient would like Dr. Chari opinion on her back pain. Asking for a call back today.   FYI Only or Action Required?: Action required by provider: clinical question for provider.  Patient is followed in Pulmonology for DOE, last seen on 05/04/2024 by Darlean Ozell NOVAK, MD.  Called Nurse Triage reporting Back Pain.  Symptoms began 9/15-a couple of hours after injection received at Dr. Chari office.  Interventions attempted: Nothing.  Symptoms are: unchanged.  Triage Disposition: See PCP When Office is Open (Within 3 Days)  Patient/caregiver understands and will follow disposition?:   Copied from CRM #[id number]. Topic: Clinical - Red Word Triage >> May 15, 2024 10:16 AM Corean SAUNDERS wrote: Red Word that prompted transfer to Nurse Triage:  Severe lower back pain since Injection Reason for Disposition  [1] MODERATE back pain (e.g., interferes with normal activities) AND [2] present > 3 days  Answer Assessment - Initial Assessment Questions 1. ONSET: When did the pain begin? (e.g., minutes, hours, days)     Started on 9/15 after receiving a steroid shot at pulmonary office 2. LOCATION: Where does it hurt? (upper, mid or lower back)     Lower back 3. SEVERITY: How bad is the pain?  (e.g., Scale 1-10; mild, moderate, or severe)     2 out of 10 currently-5 out of 10 while in the bed 4. PATTERN: Is the pain constant? (e.g., yes, no; constant, intermittent)      constant 5. RADIATION: Does the pain shoot into your legs or somewhere else?     Radiates to bilateral hips 6. CAUSE:  What do you think is causing the back pain?      Patient  states pain started after receiving a steroid shot at pulmonary office 7. BACK OVERUSE:  Any recent lifting of heavy objects, strenuous work or exercise?     no 8. MEDICINES: What have you taken so far for the pain? (e.g., nothing, acetaminophen , NSAIDS)     tylenol  9. NEUROLOGIC SYMPTOMS: Do you have any weakness, numbness, or problems with bowel/bladder control?     no 10. OTHER SYMPTOMS: Do you have any other symptoms? (e.g., fever, abdomen pain, burning with urination, blood in urine)       no  Protocols used: Back Pain-A-AH

## 2024-05-18 ENCOUNTER — Ambulatory Visit: Payer: MEDICAID | Admitting: Family

## 2024-05-18 NOTE — Telephone Encounter (Signed)
 Kathryn Ozell NOVAK, MD to Me      05/15/24  3:25 PM Sorry to hear that - not unusual but would apply ICE as much as possble over the weekend and call if redness around wound deveops or fever or go to UC   I called and spoke with the pt and notified of response from Dr Kathryn. She verbalized understanding. Nothing further needed.

## 2024-05-20 ENCOUNTER — Telehealth: Payer: Self-pay

## 2024-05-20 MED ORDER — GABAPENTIN 100 MG PO CAPS: 100.0000 mg | ORAL_CAPSULE | Freq: Four times a day (QID) | ORAL | 2 refills | Status: DC

## 2024-05-20 NOTE — Telephone Encounter (Signed)
 Copied from CRM #[id number]. Topic: Clinical - Medication Question >> May 20, 2024  2:26 PM Isabell A wrote: Reason for CRM: Patient is requesting gabapentin  (NEURONTIN ) 100 MG capsule instead of 300mg .  Dr Darlean please advise as I am unable to fill that

## 2024-05-20 NOTE — Telephone Encounter (Signed)
 Done  - ask her how many she's using on avg and the next rx we can match the number up better

## 2024-05-21 ENCOUNTER — Ambulatory Visit: Payer: MEDICAID | Admitting: Family

## 2024-05-25 ENCOUNTER — Encounter: Payer: Self-pay | Admitting: Internal Medicine

## 2024-05-27 NOTE — Telephone Encounter (Unsigned)
 Copied from CRM #[id number]. Topic: Clinical - Order For Equipment >> May 20, 2024  2:24 PM Isabell A wrote: Reason for CRM: Patient states [facility] is requesting the O2 testing & chart notes to keep her oxygen  concentrator.   Fax number: [phone]  Callback number: [phone]

## 2024-05-28 ENCOUNTER — Other Ambulatory Visit: Payer: Self-pay | Admitting: Adult Health

## 2024-05-29 ENCOUNTER — Telehealth: Payer: Self-pay

## 2024-05-29 NOTE — Telephone Encounter (Unsigned)
 Copied from CRM #[id number]. Topic: Clinical - Order For Equipment >> May 20, 2024  2:24 PM Isabell A wrote: Reason for CRM: Patient states [facility] is requesting the O2 testing & chart notes to keep her oxygen  concentrator.   Fax number: [phone]  Callback number: [phone] >> May 29, 2024  8:11 AM Isabell A wrote: Patient calling requesting to speak with [facility].  >> May 28, 2024  9:29 AM Leila BROCKS wrote: Patient [phone] is returning Peachie Barkalow's call regarding help with oxygen  concentrator. Unable to reach CAL. Please call back.

## 2024-05-29 NOTE — Telephone Encounter (Signed)
 Copied from CRM #[id number]. Topic: Clinical - Order For Equipment >> May 20, 2024  2:24 PM Isabell A wrote: Reason for CRM: Patient states [facility] is requesting the O2 testing & chart notes to keep her oxygen  concentrator.   Fax number: [phone]  Callback number: [phone] >> May 29, 2024  8:11 AM Isabell A wrote: Patient calling requesting to speak with [facility].  >> May 28, 2024  9:29 AM Leila BROCKS wrote: Patient [phone] is returning Ralph Benavidez's call regarding help with oxygen  concentrator. Unable to reach CAL. Please call back.  Sent fax to [facility]

## 2024-06-11 ENCOUNTER — Encounter: Payer: Self-pay | Admitting: Family Medicine

## 2024-06-11 ENCOUNTER — Institutional Professional Consult (permissible substitution) (INDEPENDENT_AMBULATORY_CARE_PROVIDER_SITE_OTHER): Payer: MEDICAID

## 2024-06-11 ENCOUNTER — Institutional Professional Consult (permissible substitution) (INDEPENDENT_AMBULATORY_CARE_PROVIDER_SITE_OTHER): Payer: MEDICAID | Admitting: Otolaryngology

## 2024-06-11 ENCOUNTER — Encounter: Payer: Self-pay | Admitting: Nurse Practitioner

## 2024-06-29 ENCOUNTER — Ambulatory Visit: Payer: MEDICAID | Admitting: Nurse Practitioner

## 2024-06-29 ENCOUNTER — Encounter: Payer: Self-pay | Admitting: Nurse Practitioner

## 2024-06-29 VITALS — BP 126/74 | HR 95 | Ht 64.0 in | Wt 300.8 lb

## 2024-06-29 DIAGNOSIS — Z794 Long term (current) use of insulin: Secondary | ICD-10-CM

## 2024-06-29 DIAGNOSIS — E119 Type 2 diabetes mellitus without complications: Secondary | ICD-10-CM

## 2024-06-29 DIAGNOSIS — I10 Essential (primary) hypertension: Secondary | ICD-10-CM | POA: Diagnosis not present

## 2024-06-29 DIAGNOSIS — E782 Mixed hyperlipidemia: Secondary | ICD-10-CM | POA: Diagnosis not present

## 2024-06-29 LAB — POCT GLYCOSYLATED HEMOGLOBIN (HGB A1C): Hemoglobin A1C: 9.4 % — AB (ref 4.0–5.6)

## 2024-06-29 NOTE — Progress Notes (Signed)
 Endocrinology Follow Up Note       06/29/2024, 2:25 PM   Subjective:    Patient ID: Kathryn Jimenez, female    DOB: 17-Aug-1971.  Kathryn Jimenez is being seen in follow up after being seen in consultation for management of currently uncontrolled symptomatic diabetes requested by  Joesph Annabella HERO, FNP.   Past Medical History:  Diagnosis Date   Acute on chronic congestive heart failure (HCC) 03/16/2023   Allergy    Anginal pain    Anxiety    Arthritis    Asthma    Bladder disorder, other    Borderline personality disorder (HCC)    CAD (coronary artery disease) 03/16/2023   Carotid artery disease 03/05/2017   Cataract    CVA (cerebral vascular accident) (HCC) 04/07/2023   Depression    Diabetes mellitus without complication (HCC)    GERD (gastroesophageal reflux disease)    Headache    Hyperlipidemia    Hypertension    Kidney stones    Morbid obesity with BMI of 50.0-59.9, adult (HCC) 01/12/2020   Mucous retention cyst of maxillary sinus 08/31/2021   Noted on head CT   Myocardial infarction [facility])    Neuromuscular disorder (HCC)    Neuropathy   Oxygen  deficiency    PTSD (post-traumatic stress disorder)    Shortness of breath    Sleep apnea    Stroke (HCC) 2018   Thyroid  disease    nodule    Past Surgical History:  Procedure Laterality Date   CARPAL TUNNEL RELEASE     left 2015   CERVICAL FUSION Bilateral 2003   C 5-6 with titanium screws   CHOLECYSTECTOMY     COLONOSCOPY WITH PROPOFOL  N/A 06/24/2014   Procedure: COLONOSCOPY WITH PROPOFOL ;  Surgeon: Claudis RAYMOND Rivet, MD;  Location: [facility];  Service: Endoscopy;  Laterality: N/A;  cecum time in 0810    time out   0821    total time 11 minutes   DILITATION & CURRETTAGE/HYSTROSCOPY WITH HYDROTHERMAL ABLATION N/A 04/19/2021   Procedure: DILATATION & CURETTAGE/HYSTEROSCOPY WITH HYDROTHERMAL ABLATION;  Surgeon: Ozan, Jennifer, DO;  Location: [facility];   Service: Gynecology;  Laterality: N/A;   INTRAUTERINE DEVICE (IUD) INSERTION N/A 04/19/2021   Procedure: INTRAUTERINE DEVICE (IUD) INSERTION;  Surgeon: Ozan, Jennifer, DO;  Location: [facility];  Service: Gynecology;  Laterality: N/A;   POLYPECTOMY N/A 06/24/2014   Procedure: RECTAL POLYPECTOMY;  Surgeon: Claudis RAYMOND Rivet, MD;  Location: [facility];  Service: Endoscopy;  Laterality: N/A;   RIGHT/LEFT HEART CATH AND CORONARY ANGIOGRAPHY N/A 06/19/2022   Procedure: RIGHT/LEFT HEART CATH AND CORONARY ANGIOGRAPHY;  Surgeon: Wonda Sharper, MD;  Location: [facility] INVASIVE CV LAB;  Service: Cardiovascular;  Laterality: N/A;   SPINE SURGERY     URETHRAL DILATION  1981   WISDOM TOOTH EXTRACTION Bilateral 2001 and 1990s   top and bottom     Social History   Socioeconomic History   Marital status: Married    Spouse name: Dempsey   Number of children: 0   Years of education: HS   Highest education level: 12th grade  Occupational History   Occupation: Disabled  Tobacco Use   Smoking status: Never   Smokeless tobacco:  Never  Vaping Use   Vaping status: Never Used  Substance and Sexual Activity   Alcohol use: No    Comment: Rarely   Drug use: No   Sexual activity: Not Currently    Birth control/protection: I.U.D.  Other Topics Concern   Not on file  Social History Narrative   Lives at home with husband.   Right-handed.   3-4 cups caffeine per day.   Social Drivers of Corporate Investment Banker Strain: Low Risk  (03/26/2024)   Overall Financial Resource Strain (CARDIA)    Difficulty of Paying Living Expenses: Not very hard  Recent Concern: Financial Resource Strain - Medium Risk (02/14/2024)   Received from [facility]   Overall Financial Resource Strain (CARDIA)    How hard is it for you to pay for the very basics like food, housing, medical care, and heating?: Somewhat hard  Food Insecurity: Patient Declined (03/26/2024)   Hunger Vital Sign    Worried About Running Out of Food in the Last Year:  Patient declined    Ran Out of Food in the Last Year: Patient declined  Transportation Needs: No Transportation Needs (03/26/2024)   PRAPARE - Administrator, Civil Service (Medical): No    Lack of Transportation (Non-Medical): No  Physical Activity: Insufficiently Active (03/26/2024)   Exercise Vital Sign    Days of Exercise per Week: 2 days    Minutes of Exercise per Session: 10 min  Stress: Stress Concern Present (03/26/2024)   [employer] of Occupational Health - Occupational Stress Questionnaire    Feeling of Stress: To some extent  Social Connections: Moderately Isolated (03/26/2024)   Social Connection and Isolation Panel    Frequency of Communication with Friends and Family: More than three times a week    Frequency of Social Gatherings with Friends and Family: Once a week    Attends Religious Services: Patient declined    Database Administrator or Organizations: No    Attends Engineer, Structural: Not on file    Marital Status: Married    Family History  Problem Relation Age of Onset   COPD Mother    Arthritis Mother    Asthma Mother    Depression Mother    Diabetes Mother    Heart disease Mother    Hyperlipidemia Mother    Hypertension Mother    Mental illness Mother    Skin cancer Mother    Obesity Mother    Vision loss Mother    Varicose Veins Mother    Lung cancer Father    Arthritis Father    Depression Father    Drug abuse Father    Early death Father    Kidney disease Father    Mental illness Father    Cancer Father    Colon cancer Sister    Birth defects Sister    COPD Sister    Hyperlipidemia Sister    Learning disabilities Sister    Breast cancer Sister    Anxiety disorder Sister    Cancer Sister    Obesity Sister    Breast cancer Sister    Colon cancer Sister    Birth defects Sister    Cancer Sister    Miscarriages / Stillbirths Sister    Vision loss Maternal Grandmother    Alcohol abuse Paternal Grandmother     Diabetes Paternal Grandmother    Stroke Paternal Grandmother    Alcohol abuse Paternal Grandfather     Outpatient  Encounter Medications as of 06/29/2024  Medication Sig   Accu-Chek Softclix Lancets lancets Use to check blood glucose four times daily as instructed   acetaminophen  (TYLENOL ) 500 MG tablet Take 500 mg by mouth every 6 (six) hours as needed for mild pain or moderate pain.   albuterol  (VENTOLIN  HFA) 108 (90 Base) MCG/ACT inhaler inhale two puffs into the lungs every 6 (six) hours as needed for wheezing.   budesonide -formoterol  (SYMBICORT ) 80-4.5 MCG/ACT inhaler Take 2 puffs first thing in am and then another 2 puffs about 12 hours later.   buPROPion  (WELLBUTRIN  XL) 150 MG 24 hr tablet Take 150 mg by mouth daily.   clopidogrel  (PLAVIX ) 75 MG tablet Take 1 tablet (75 mg total) by mouth daily.   clotrimazole -betamethasone  (LOTRISONE ) cream Apply thin layer to affected external area up to twice daily as needed   Continuous Glucose Sensor (DEXCOM G7 SENSOR) MISC Inject 1 Application into the skin as directed. Change sensor every 10 days as directed.   cycloSPORINE  (RESTASIS ) 0.05 % ophthalmic emulsion Place 1 drop into both eyes 2 (two) times daily.   diphenhydrAMINE (BENADRYL) 25 mg capsule Take 25 mg by mouth every 6 (six) hours as needed for allergies.   fluconazole  (DIFLUCAN ) 150 MG tablet Take one pill every 3 days   FLUoxetine  (PROZAC ) 40 MG capsule Take 40 mg by mouth daily.   gabapentin  (NEURONTIN ) 100 MG capsule Take 1 capsule (100 mg total) by mouth 4 (four) times daily. (Patient taking differently: Take 200 mg by mouth 2 (two) times daily.)   glucose blood (ACCU-CHEK GUIDE TEST) test strip Use to check blood glucose four times daily as instructed   insulin  glargine (LANTUS  SOLOSTAR) 100 UNIT/ML Solostar Pen Inject 35 Units into the skin at bedtime.   insulin  lispro (HUMALOG  KWIKPEN) 100 UNIT/ML KwikPen Inject 14-20 Units into the skin 3 (three) times daily.   Insulin  Pen  Needle (PEN NEEDLES) 31G X 6 MM MISC Use to inject insulin  4 times daily   levonorgestrel  (LILETTA , 52 MG,) 20.1 MCG/DAY IUD 1 each by Intrauterine route once. Inserted 04/19/21   LORazepam  (ATIVAN ) 0.5 MG tablet Take 0.25-0.5 mg by mouth daily as needed for anxiety.   Magnesium  Glycinate 100 MG CAPS    montelukast  (SINGULAIR ) 10 MG tablet take 1 tablet (10 milligram total) by mouth at bedtime.   NON FORMULARY Vit d3 with K2 taking once a day   nystatin  (MYCOSTATIN /NYSTOP ) powder Apply 1 application  topically daily as needed (for fungal infections).   OVER THE COUNTER MEDICATION Patient states that she is taking Vitamin K2+D 3 - patient states that she takes 2 a day.   OXYGEN  Inhale 4 L/min into the lungs continuous.   potassium chloride  (KLOR-CON ) 10 MEQ tablet Take 10 mEq by mouth 2 (two) times daily.   torsemide  (DEMADEX ) 20 MG tablet Take 1-2 tablets (20-40 mg total) by mouth daily. Can take extra tablet with weight gain greater than 3 lb in 24 hours   valACYclovir  (VALTREX ) 500 MG tablet take 1 tablet (500 MILLIGRAM total) by mouth daily.   No facility-administered encounter medications on file as of 06/29/2024.    ALLERGIES: Allergies  Allergen Reactions   Actos [Pioglitazone] Swelling   Augmentin [Amoxicillin-Pot Clavulanate] Diarrhea, Nausea Only and Other (See Comments)    Caused severe diarrhea   Citrus Other (See Comments)    Issues with my tongue- the fleshy part of the fruit    Empagliflozin Dermatitis, Itching, Other (See Comments) and Swelling   Invokana [Canagliflozin]  Other (See Comments)    Yeast infection   Lopid  [Gemfibrozil ] Other (See Comments)    Made the tongue burn   Metoprolol  Other (See Comments)    Headaches    Naproxen Sodium Other (See Comments)    Patient states she felt loopy   Penicillins Diarrhea and Other (See Comments)    Violent diarrhea- must have Diflucan    Pineapple Other (See Comments)    Issues with my tongue   Statins Other (See  Comments)    Joint pain   Tape Itching and Other (See Comments)    Tape, EKG leads, and Band-Aids - Itching and redness   Zetia  [Ezetimibe ] Other (See Comments)    Headaches    Amlodipine  Cough   Clavulanic Acid Nausea Only   Hctz [Hydrochlorothiazide] Rash   Lisinopril  Cough   Morphine And Codeine Itching and Rash    VACCINATION STATUS: Immunization History  Administered Date(s) Administered   Influenza,inj,Quad PF,6+ Mos 07/01/2017, 05/29/2018   Influenza-Unspecified 05/25/2014, 07/03/2016   Tdap 01/26/2010, 01/21/2020    Diabetes She presents for her follow-up diabetic visit. She has type 2 diabetes mellitus. Onset time: Diagnosed at approx age of 81. Her disease course has been worsening. There are no hypoglycemic associated symptoms. Associated symptoms include fatigue, foot paresthesias, polydipsia and polyuria. Pertinent negatives for diabetes include no weight loss. There are no hypoglycemic complications. Symptoms are improving. Diabetic complications include a CVA, heart disease (CHF) and peripheral neuropathy. Risk factors for coronary artery disease include obesity, hypertension, sedentary lifestyle, family history, dyslipidemia and diabetes mellitus. Current diabetic treatment includes intensive insulin  program. She is compliant with treatment some of the time. Her weight is increasing steadily. She is following a generally unhealthy diet. Meal planning includes avoidance of concentrated sweets. She has not had a previous visit with a dietitian. She participates in exercise intermittently. Her overall blood glucose range is >200 mg/dl. (She presents today, with her CGM showing gross hyperglycemia overall.  Her POCT A1c today is 9.4%, increasing from last visit of 8.3%.  She notes she has had a crazy year with health issues with both her and her husband.  She admits her diet could use some improvement, has been having big sugar cravings.  She did just get off antibiotics for mouth  issues.) An ACE inhibitor/angiotensin II receptor blocker is being taken. She does not see a podiatrist.Eye exam is current.     Review of systems  Constitutional: + increasing body weight,  current Body mass index is 51.63 kg/m. , no fatigue, no subjective hyperthermia, no subjective hypothermia Eyes: no blurry vision, no xerophthalmia ENT: no sore throat, no nodules palpated in throat, no dysphagia/odynophagia, no hoarseness Cardiovascular: no chest pain, + shortness of breath- on O2 PRN, no palpitations, no leg swelling Respiratory: no cough, + shortness of breath- on O2 PRN Gastrointestinal: no nausea/vomiting/diarrhea Musculoskeletal: no muscle/joint aches Skin: no rashes, no hyperemia Neurological: no tremors, no numbness, no tingling, no dizziness Psychiatric: no depression, no anxiety  Objective:     BP 126/74 (BP Location: Left Arm, Patient Position: Sitting, Cuff Size: Large)   Pulse 95   Ht 5' 4 (1.626 m)   Wt (!) 300 lb 12.8 oz (136.4 kg)   BMI 51.63 kg/m   Wt Readings from Last 3 Encounters:  06/29/24 (!) 300 lb 12.8 oz (136.4 kg)  05/04/24 296 lb 12.8 oz (134.6 kg)  04/15/24 291 lb 9.6 oz (132.3 kg)     BP Readings from Last 3 Encounters:  06/29/24 126/74  05/04/24 139/74  04/15/24 106/64       Physical Exam- Limited  Constitutional:  Body mass index is 51.63 kg/m. , not in acute distress, normal state of mind Eyes:  EOMI, no exophthalmos Musculoskeletal: no gross deformities, strength intact in all four extremities, no gross restriction of joint movements Skin:  no rashes, no hyperemia Neurological: no tremor with outstretched hands    CMP ( most recent) CMP     Component Value Date/Time   NA 137 01/14/2024 1025   K 4.5 01/14/2024 1025   CL 98 01/14/2024 1025   CO2 21 01/14/2024 1025   GLUCOSE 120 (H) 01/14/2024 1025   GLUCOSE 265 (H) 03/19/2023 0105   BUN 21 01/14/2024 1025   CREATININE 0.88 01/14/2024 1025   CALCIUM  9.9 01/14/2024 1025    PROT 7.2 01/14/2024 1025   ALBUMIN 4.0 01/14/2024 1025   AST 12 01/14/2024 1025   ALT 11 01/14/2024 1025   ALKPHOS 145 (H) 01/14/2024 1025   BILITOT 0.3 01/14/2024 1025   GFRNONAA 38 (L) 03/19/2023 0105   GFRAA 125 07/01/2017 1019     Diabetic Labs (most recent): Lab Results  Component Value Date   HGBA1C 9.4 (A) 06/29/2024   HGBA1C 8.3 (H) 01/14/2024   HGBA1C 11.1 (H) 10/15/2023     Lipid Panel ( most recent) Lipid Panel     Component Value Date/Time   CHOL 181 10/15/2023 1022   TRIG 122 10/15/2023 1022   HDL 52 10/15/2023 1022   CHOLHDL 3.5 10/15/2023 1022   CHOLHDL 4.9 09/13/2022 1045   VLDL 25 09/13/2022 1045   LDLCALC 107 (H) 10/15/2023 1022   LABVLDL 22 10/15/2023 1022      Lab Results  Component Value Date   TSH 2.630 01/14/2024   TSH 1.830 09/20/2023   TSH 2.300 08/08/2023   TSH 1.910 05/16/2023   TSH 2.320 12/18/2016           Assessment & Plan:   1) Type 2 diabetes without complication with long-term current use of insulin   She presents today, with her CGM showing gross hyperglycemia overall.  Her POCT A1c today is 9.4%, increasing from last visit of 8.3%.  She notes she has had a crazy year with health issues with both her and her husband.  She admits her diet could use some improvement, has been having big sugar cravings.  She did just get off antibiotics for mouth issues.  - Luana Freese has currently uncontrolled symptomatic type 2 DM since 53 years of age.   -Recent labs reviewed.  - I had a long discussion with her about the progressive nature of diabetes and the pathology behind its complications. -her diabetes is complicated by CHF, neuropathy, CVA and she remains at a high risk for more acute and chronic complications which include CAD, CVA, CKD, retinopathy, and neuropathy. These are all discussed in detail with her.  The following Lifestyle Medicine recommendations according to American College of Lifestyle Medicine [facility]) were  discussed and offered to patient and she agrees to start the journey:  A. Whole Foods, Plant-based plate comprising of fruits and vegetables, plant-based proteins, whole-grain carbohydrates was discussed in detail with the patient.   A list for source of those nutrients were also provided to the patient.  Patient will use only water  or unsweetened tea for hydration. B.  The need to stay away from risky substances including alcohol, smoking; obtaining 7 to 9 hours of restorative sleep, at least 150 minutes of moderate intensity exercise  weekly, the importance of healthy social connections,  and stress reduction techniques were discussed. C.  A full color page of  Calorie density of various food groups per pound showing examples of each food groups was provided to the patient.  - Nutritional counseling repeated/built upon at each appointment.  - The patient admits there is a room for improvement in their diet and drink choices. -  Suggestion is made for the patient to avoid simple carbohydrates from their diet including Cakes, Sweet Desserts / Pastries, Ice Cream, Soda (diet and regular), Sweet Tea, Candies, Chips, Cookies, Sweet Pastries, Store Bought Juices, Alcohol in Excess of 1-2 drinks a day, Artificial Sweeteners, Coffee Creamer, and Sugar-free Products. This will help patient to have stable blood glucose profile and potentially avoid unintended weight gain.   - I encouraged the patient to switch to unprocessed or minimally processed complex starch and increased protein intake (animal or plant source), fruits, and vegetables.   - Patient is advised to stick to a routine mealtimes to eat 3 meals a day and avoid unnecessary snacks (to snack only to correct hypoglycemia).  - I have approached her with the following individualized plan to manage her diabetes and patient agrees:   -She is advised to continue her Lantus  35 units SQ nightly and Novolog  14-20 units TID with meals if glucose is above  90 and she is eating (Specific instructions on how to titrate insulin  dosage based on glucose readings given to patient in writing).   Will trial her on Ozempic 0.25 mg SQ weekly x 2 weeks, then increase to 0.5 mg SQ weekly thereafter if tolerated well.  She is most concerned with side effects, I did give her a sample today to try.  I asked she send me an update in 2 weeks.  -she is encouraged to start monitoring blood glucose 4 times daily, before meals and before bed, and to call the clinic if she has readings less than 70 or above 300 for 3 tests in a row.    - she is warned not to take insulin  without proper monitoring per orders. - Adjustment parameters are given to her for hypo and hyperglycemia in writing.  - her JARDIANCE was previously discontinued, risk outweighs benefit for this patient.  Given her account of frequent UTIs and vaginal yeast infections, and skin irritation in her groin, this class of medications is not the best for her.  - Specific targets for  A1c; LDL, HDL, and Triglycerides were discussed with the patient.  2) Blood Pressure /Hypertension:  her blood pressure is controlled to target.   she is advised to continue her current medications as prescribed by her PCP.  3) Lipids/Hyperlipidemia:    Review of her recent lipid panel from 10/15/23 showed uncontrolled LDL at 107.  She has statin intolerance but is advised to continue Zetia  10 mg daily.  She has upcoming appt with PCP, assuming they will be running fasting labs.  Will defer checking to them.  4)  Weight/Diet:  her Body mass index is 51.63 kg/m.  -  clearly complicating her diabetes care.   she is a candidate for weight loss. I discussed with her the fact that loss of 5 - 10% of her  current body weight will have the most impact on her diabetes management.  Exercise, and detailed carbohydrates information provided  -  detailed on discharge instructions.  5) Chronic Care/Health Maintenance: -she is on ACEI/ARB and  has intolerance to Statin medications and is  encouraged to initiate and continue to follow up with Ophthalmology, Dentist, Podiatrist at least yearly or according to recommendations, and advised to stay away from smoking. I have recommended yearly flu vaccine and pneumonia vaccine at least every 5 years; moderate intensity exercise for up to 150 minutes weekly; and sleep for at least 7 hours a day.  - she is advised to maintain close follow up with Joesph Annabella HERO, FNP for primary care needs, as well as her other providers for optimal and coordinated care.     I spent  45  minutes in the care of the patient today including review of labs from CMP, Lipids, Thyroid  Function, Hematology (current and previous including abstractions from other facilities); face-to-face time discussing  her blood glucose readings/logs, discussing hypoglycemia and hyperglycemia episodes and symptoms, medications doses, her options of short and long term treatment based on the latest standards of care / guidelines;  discussion about incorporating lifestyle medicine;  and documenting the encounter. Risk reduction counseling performed per USPSTF guidelines to reduce obesity and cardiovascular risk factors.     Please refer to Patient Instructions for Blood Glucose Monitoring and Insulin /Medications Dosing Guide  in media tab for additional information. Please  also refer to  Patient Self Inventory in the Media  tab for reviewed elements of pertinent patient history.  Floria Molino participated in the discussions, expressed understanding, and voiced agreement with the above plans.  All questions were answered to her satisfaction. she is encouraged to contact clinic should she have any questions or concerns prior to her return visit.    Follow up plan: - Return in about 4 months (around 10/27/2024) for Diabetes F/U with A1c in office, No previsit labs, Bring meter and logs.  Benton Rio, [facility]  [facility] [address] [place], [place] [address] Phone: [phone] Fax: [phone]  06/29/2024, 2:25 PM

## 2024-07-01 ENCOUNTER — Encounter (INDEPENDENT_AMBULATORY_CARE_PROVIDER_SITE_OTHER): Payer: Self-pay

## 2024-07-01 ENCOUNTER — Ambulatory Visit (INDEPENDENT_AMBULATORY_CARE_PROVIDER_SITE_OTHER): Payer: MEDICAID

## 2024-07-01 VITALS — BP 155/88 | HR 80 | Temp 97.8°F | Ht 64.0 in | Wt 300.0 lb

## 2024-07-01 DIAGNOSIS — L299 Pruritus, unspecified: Secondary | ICD-10-CM | POA: Diagnosis not present

## 2024-07-01 DIAGNOSIS — H608X3 Other otitis externa, bilateral: Secondary | ICD-10-CM

## 2024-07-01 DIAGNOSIS — J3489 Other specified disorders of nose and nasal sinuses: Secondary | ICD-10-CM | POA: Diagnosis not present

## 2024-07-01 DIAGNOSIS — R6884 Jaw pain: Secondary | ICD-10-CM

## 2024-07-01 MED ORDER — FLUOCINOLONE ACETONIDE 0.01 % OT OIL: 3.0000 [drp] | TOPICAL_OIL | Freq: Two times a day (BID) | OTIC | 0 refills | Status: AC

## 2024-07-01 NOTE — Progress Notes (Signed)
 Dear Dr. Darlean, Here is my assessment for our mutual patient, Kathryn Jimenez. Thank you for allowing me the opportunity to care for your patient. Please do not hesitate to contact me should you have any other questions. Sincerely, Dr. Hadassah Parody  Otolaryngology Clinic Note Referring provider: Dr. Darlean HPI:   Initial HPI (07/01/2024) Discussed the use of AI scribe software for clinical note transcription with the patient, who gave verbal consent to proceed.  History of Present Illness Kathryn Jimenez is a 53 year old female with a history of poorly controlled diabetes presents with jaw and tooth pain and recurrent ear infections.   Odontogenic and jaw pain - Intermittent pain localized to the teeth, particularly around the posterior tooth, left side - Pain radiates upwards and is described as a sensation of teeth 'falling out' - Pain is exacerbated by brushing teeth and even by breathing - Area is tender to palpation - Recent dental evaluation confirmed no cavities and healthy teeth  Sinus and facial pain - History of recurrent sinus infections, primarily affecting the left side - Associated facial pain with sinus infections - no recent infections   Otologic symptoms - History of recurrent ear infections, predominantly on the left side - Previously treated with antibiotics and ear drops - No current ear pain on the right side - Ear itching present bilaterally    Independent Review of Additional Tests or Records:  Referral note Kathryn Darlean, MD (05/04/24): cough variant ashma versus upper airway cough syndrome. Placed on gabapentin . Nasal and ear congestion.   06/29/24 HbA1c: 9.4   Note by Kathryn Rio, NP (06/29/24): uncontrolled diabetes with worsening A1C from 8.3 to 9.4%  PMH/Meds/All/SocHx/FamHx/ROS:   Past Medical History:  Diagnosis Date   Acute on chronic congestive heart failure (HCC) 03/16/2023   Allergy    Anginal pain    Anxiety    Arthritis    Asthma     Bladder disorder, other    Borderline personality disorder (HCC)    CAD (coronary artery disease) 03/16/2023   Carotid artery disease 03/05/2017   Cataract    CVA (cerebral vascular accident) (HCC) 04/07/2023   Depression    Diabetes mellitus without complication (HCC)    GERD (gastroesophageal reflux disease)    Headache    Hyperlipidemia    Hypertension    Kidney stones    Morbid obesity with BMI of 50.0-59.9, adult (HCC) 01/12/2020   Mucous retention cyst of maxillary sinus 08/31/2021   Noted on head CT   Myocardial infarction [facility])    Neuromuscular disorder (HCC)    Neuropathy   Oxygen  deficiency    PTSD (post-traumatic stress disorder)    Shortness of breath    Sleep apnea    Stroke (HCC) 2018   Thyroid  disease    nodule     Past Surgical History:  Procedure Laterality Date   CARPAL TUNNEL RELEASE     left 2015   CERVICAL FUSION Bilateral 2003   C 5-6 with titanium screws   CHOLECYSTECTOMY     COLONOSCOPY WITH PROPOFOL  N/A 06/24/2014   Procedure: COLONOSCOPY WITH PROPOFOL ;  Surgeon: Claudis RAYMOND Rivet, MD;  Location: [facility];  Service: Endoscopy;  Laterality: N/A;  cecum time in 0810    time out   0821    total time 11 minutes   DILITATION & CURRETTAGE/HYSTROSCOPY WITH HYDROTHERMAL ABLATION N/A 04/19/2021   Procedure: DILATATION & CURETTAGE/HYSTEROSCOPY WITH HYDROTHERMAL ABLATION;  Surgeon: Ozan, Jennifer, DO;  Location: [facility];  Service: Gynecology;  Laterality:  N/A;   INTRAUTERINE DEVICE (IUD) INSERTION N/A 04/19/2021   Procedure: INTRAUTERINE DEVICE (IUD) INSERTION;  Surgeon: Ozan, Jennifer, DO;  Location: [facility];  Service: Gynecology;  Laterality: N/A;   POLYPECTOMY N/A 06/24/2014   Procedure: RECTAL POLYPECTOMY;  Surgeon: Claudis RAYMOND Rivet, MD;  Location: [facility];  Service: Endoscopy;  Laterality: N/A;   RIGHT/LEFT HEART CATH AND CORONARY ANGIOGRAPHY N/A 06/19/2022   Procedure: RIGHT/LEFT HEART CATH AND CORONARY ANGIOGRAPHY;  Surgeon: Wonda Sharper, MD;  Location: [facility]  INVASIVE CV LAB;  Service: Cardiovascular;  Laterality: N/A;   SPINE SURGERY     URETHRAL DILATION  1981   WISDOM TOOTH EXTRACTION Bilateral 2001 and 1990s   top and bottom     Family History  Problem Relation Age of Onset   COPD Mother    Arthritis Mother    Asthma Mother    Depression Mother    Diabetes Mother    Heart disease Mother    Hyperlipidemia Mother    Hypertension Mother    Mental illness Mother    Skin cancer Mother    Obesity Mother    Vision loss Mother    Varicose Veins Mother    Lung cancer Father    Arthritis Father    Depression Father    Drug abuse Father    Early death Father    Kidney disease Father    Mental illness Father    Cancer Father    Colon cancer Sister    Birth defects Sister    COPD Sister    Hyperlipidemia Sister    Learning disabilities Sister    Breast cancer Sister    Anxiety disorder Sister    Cancer Sister    Obesity Sister    Breast cancer Sister    Colon cancer Sister    Birth defects Sister    Cancer Sister    Miscarriages / Stillbirths Sister    Vision loss Maternal Grandmother    Alcohol abuse Paternal Grandmother    Diabetes Paternal Grandmother    Stroke Paternal Grandmother    Alcohol abuse Paternal Grandfather      Social Connections: Moderately Isolated (03/26/2024)   Social Connection and Isolation Panel    Frequency of Communication with Friends and Family: More than three times a week    Frequency of Social Gatherings with Friends and Family: Once a week    Attends Religious Services: Patient declined    Database Administrator or Organizations: No    Attends Engineer, Structural: Not on file    Marital Status: Married     Current Outpatient Medications  Medication Instructions   Accu-Chek Softclix Lancets lancets Use to check blood glucose four times daily as instructed   acetaminophen  (TYLENOL ) 500 mg, Every 6 hours PRN   albuterol  (VENTOLIN  HFA) 108 (90 Base) MCG/ACT inhaler inhale two puffs  into the lungs every 6 (six) hours as needed for wheezing.   budesonide -formoterol  (SYMBICORT ) 80-4.5 MCG/ACT inhaler Take 2 puffs first thing in am and then another 2 puffs about 12 hours later.   buPROPion  (WELLBUTRIN  XL) 150 mg, Daily   clopidogrel  (PLAVIX ) 75 mg, Oral, Daily   clotrimazole -betamethasone  (LOTRISONE ) cream Apply thin layer to affected external area up to twice daily as needed   Continuous Glucose Sensor (DEXCOM G7 SENSOR) MISC 1 Application, Subcutaneous, As directed, Change sensor every 10 days as directed.   cycloSPORINE  (RESTASIS ) 0.05 % ophthalmic emulsion 1 drop, 2 times daily   diphenhydrAMINE (BENADRYL) 25 mg, Every  6 hours PRN   fluconazole  (DIFLUCAN ) 150 MG tablet Take one pill every 3 days   Fluocinolone Acetonide (DERMOTIC) 0.01 % OIL 3 drops, OTIC (EAR), 2 times daily   FLUoxetine  (PROZAC ) 40 mg, Daily   gabapentin  (NEURONTIN ) 100 mg, Oral, 4 times daily   glucose blood (ACCU-CHEK GUIDE TEST) test strip Use to check blood glucose four times daily as instructed   insulin  lispro (HUMALOG  KWIKPEN) 14-20 Units, Subcutaneous, 3 times daily   Insulin  Pen Needle (PEN NEEDLES) 31G X 6 MM MISC Use to inject insulin  4 times daily   Lantus  SoloStar 35 Units, Subcutaneous, Daily at bedtime   levonorgestrel  (LILETTA , 52 MG,) 20.1 MCG/DAY IUD 1 each,  Once   LORazepam  (ATIVAN ) 0.25-0.5 mg, Daily PRN   Magnesium  Glycinate 100 MG CAPS    montelukast  (SINGULAIR ) 10 MG tablet take 1 tablet (10 milligram total) by mouth at bedtime.   NON FORMULARY Vit d3 with K2 taking once a day   nystatin  (MYCOSTATIN /NYSTOP ) powder 1 application , Daily PRN   OVER THE COUNTER MEDICATION Patient states that she is taking Vitamin K2+D 3 - patient states that she takes 2 a day.   OXYGEN  4 L/min, Continuous   potassium chloride  (KLOR-CON ) 10 MEQ tablet 10 mEq, 2 times daily   torsemide  (DEMADEX ) 20-40 mg, Oral, Daily, Can take extra tablet with weight gain greater than 3 lb in 24 hours    valACYclovir  (VALTREX ) 500 MG tablet take 1 tablet (500 MILLIGRAM total) by mouth daily.     Physical Exam:   BP (!) 155/88 (BP Location: Right Arm, Patient Position: Sitting) Comment: Pt nerves are shot today and she had got sick right before her appt due to nerves  Pulse 80   Temp 97.8 F (36.6 C)   Ht 5' 4 (1.626 m)   Wt 300 lb (136.1 kg)   SpO2 97%   BMI 51.49 kg/m   Salient findings:  CN II-XII intact Given history and complaints, ear microscopy was indicated and performed for evaluation with findings as below in physical exam section and in procedures  Bilateral EAC clear and TM intact with well pneumatized middle ear spaces. Bilateral EAC dry with minimal cerumen.  Anterior rhinoscopy: Septum midline anteriorly; no drainage, b/l IT with mild hypertrophy  No lesions of oral cavity/oropharynx; dentition fair; no lesions or ulcerations noted at area of concern.  No swelling along mandible. No pain with palpation of submandibular glands  No obviously palpable neck masses/lymphadenopathy/thyromegaly No respiratory distress or stridor  Seprately Identifiable Procedures:  Prior to initiating any procedures, risks/benefits/alternatives were explained to the patient and verbal consent obtained.  Procedure (07/01/2024): Bilateral ear microscopy using microscope (CPT [id number]) Pre-procedure diagnosis: recurrent left ear infections  Post-procedure diagnosis: bilateral otitis externa  Indication: see above; given patient's otologic complaints and history, for improved and comprehensive examination of external ear and tympanic membrane, bilateral otologic examination using microscope was performed. Prior to proceeding, verbal consent was obtained after discussion of R/B/A  Procedure: Patient was placed semi-recumbent. Both ear canals were examined using the microscope with findings above. Patient tolerated the procedure well.   Impression & Plans:  Kathryn Jimenez is a 53 y.o. female with    1. Ear itching   2. Chronic eczematous otitis externa of both ears   3. Jaw pain   4. Sinus pain    Assessment and Plan Assessment & Plan Jaw and tooth pain, possible trigeminal neuralgia Intermittent jaw and tooth pain described as severe pain that radiates  along her jaw. Exacerbated by brushing teeth. No dental issues found. Discussed that I do not typically treat the teeth but could consider referral for consideration of trigeminal neuralgia - Patients want to find a new dentist first and get second opinion; if needed she will call for referral to neurologist if second dental visit shows healthy teeth  - Advised follow-up with a dentist for further evaluation.  Bilateral ear itching Bilateral otitis externa  Bilateral ear itching without pain, likely due to dryness. No infection present.  - Prescribed ear drops for dryness.  Sinus pain - history of sinus infections with no pain currently and no concerning findings on exam - continue to monitor    See below regarding exact medications prescribed this encounter including dosages and route: Meds ordered this encounter  Medications   Fluocinolone Acetonide (DERMOTIC) 0.01 % OIL    Sig: Place 3 drops in ear(s) in the morning and at bedtime for 7 days.    Dispense:  20 mL    Refill:  0    Thank you for allowing me the opportunity to care for your patient. Please do not hesitate to contact me should you have any other questions.  Sincerely, Hadassah Parody, MD Otolaryngologist (ENT), [facility] Phone: [phone] Fax: [phone]

## 2024-07-02 ENCOUNTER — Other Ambulatory Visit: Payer: Self-pay | Admitting: Nurse Practitioner

## 2024-07-03 ENCOUNTER — Telehealth: Payer: Self-pay | Admitting: Nurse Practitioner

## 2024-07-03 MED ORDER — CLOPIDOGREL BISULFATE 75 MG PO TABS: 75.0000 mg | ORAL_TABLET | Freq: Every day | ORAL | 3 refills | Status: AC

## 2024-07-03 NOTE — Telephone Encounter (Signed)
*  STAT* If patient is at the pharmacy, call can be transferred to refill team.   1. Which medications need to be refilled? (please list name of each medication and dose if known) clopidogrel  (PLAVIX ) 75 MG tablet    2. Would you like to learn more about the convenience, safety, & potential cost savings by using the [facility]?     3. Are you open to using the [facility] (Type [facility].  ).   4. Which pharmacy/location (including street and city if local pharmacy) is medication to be sent to? [facility] - [place],  - [address]    5. Do they need a 30 day or 90 day supply? 30 day

## 2024-07-03 NOTE — Telephone Encounter (Signed)
 Prescription sent to [facility] right after call. Confirmed by pharmacy 07/03/24 at 1325

## 2024-07-09 ENCOUNTER — Other Ambulatory Visit: Payer: Self-pay | Admitting: Physician Assistant

## 2024-07-10 ENCOUNTER — Telehealth: Payer: Self-pay | Admitting: Internal Medicine

## 2024-07-10 MED ORDER — POTASSIUM CHLORIDE ER 10 MEQ PO TBCR: 10.0000 meq | EXTENDED_RELEASE_TABLET | Freq: Two times a day (BID) | ORAL | 0 refills | Status: AC

## 2024-07-10 NOTE — Telephone Encounter (Signed)
*  STAT* If patient is at the pharmacy, call can be transferred to refill team.   1. Which medications need to be refilled? (please list name of each medication and dose if known) potassium chloride  (KLOR-CON ) 10 MEQ tablet    2. Would you like to learn more about the convenience, safety, & potential cost savings by using the [facility]?      3. Are you open to using the [facility] (Type [facility].  ).   4. Which pharmacy/location (including street and city if local pharmacy) is medication to be sent to?  [facility] - [place], [place] - [address]    5. Do they need a 30 day or 90 day supply? 90 day

## 2024-07-10 NOTE — Telephone Encounter (Signed)
 Refill sent.

## 2024-07-20 ENCOUNTER — Other Ambulatory Visit: Payer: Self-pay | Admitting: Physician Assistant

## 2024-07-20 DIAGNOSIS — I5032 Chronic diastolic (congestive) heart failure: Secondary | ICD-10-CM

## 2024-07-21 ENCOUNTER — Other Ambulatory Visit: Payer: Self-pay | Admitting: Nurse Practitioner

## 2024-08-03 ENCOUNTER — Ambulatory Visit: Payer: MEDICAID | Admitting: Internal Medicine

## 2024-08-17 ENCOUNTER — Other Ambulatory Visit: Payer: Self-pay | Admitting: Internal Medicine

## 2024-08-21 ENCOUNTER — Other Ambulatory Visit: Payer: Self-pay | Admitting: Internal Medicine

## 2024-08-21 DIAGNOSIS — J45991 Cough variant asthma: Secondary | ICD-10-CM

## 2024-08-21 NOTE — Telephone Encounter (Signed)
 Copied from CRM [id number]. Topic: Clinical - Medication Refill >> Aug 21, 2024  4:20 PM Isabell A wrote: Medication: budesonide -formoterol  (SYMBICORT ) 80-4.5 MCG/ACT inhaler [[id number]]  Has the patient contacted their pharmacy? Yes (Agent: If no, request that the patient contact the pharmacy for the refill. If patient does not wish to contact the pharmacy document the reason why and proceed with request.) (Agent: If yes, when and what did the pharmacy advise?)  This is the patient's preferred pharmacy:  [facility] - [place], [place] - [address] [place] [address] Phone: [phone] Fax: [phone]  Is this the correct pharmacy for this prescription? Yes If no, delete pharmacy and type the correct one.   Has the prescription been filled recently? Yes  Is the patient out of the medication? Yes  Has the patient been seen for an appointment in the last year OR does the patient have an upcoming appointment? No  Can we respond through MyChart? No  Agent: Please be advised that Rx refills may take up to 3 business days. We ask that you follow-up with your pharmacy.

## 2024-08-24 MED ORDER — BUDESONIDE-FORMOTEROL FUMARATE 80-4.5 MCG/ACT IN AERO: INHALATION_SPRAY | RESPIRATORY_TRACT | 6 refills | Status: AC

## 2024-08-25 DIAGNOSIS — J9611 Chronic respiratory failure with hypoxia: Secondary | ICD-10-CM

## 2024-09-03 ENCOUNTER — Ambulatory Visit: Payer: MEDICAID | Admitting: Internal Medicine

## 2024-09-23 ENCOUNTER — Ambulatory Visit: Payer: MEDICAID | Admitting: Internal Medicine

## 2024-10-16 ENCOUNTER — Encounter: Payer: MEDICAID | Admitting: Family Medicine

## 2024-10-22 ENCOUNTER — Ambulatory Visit: Payer: MEDICAID | Admitting: Internal Medicine

## 2024-10-27 ENCOUNTER — Ambulatory Visit: Payer: MEDICAID | Admitting: Nurse Practitioner

## 2024-12-10 ENCOUNTER — Ambulatory Visit: Payer: MEDICAID | Admitting: Nurse Practitioner
